# Patient Record
Sex: Female | Born: 1960 | Race: Black or African American | Hispanic: No | Marital: Single | Smoking: Never smoker
Health system: Southern US, Community
[De-identification: ages and names within clinical notes are randomized; demographics above are authoritative.]

## PROBLEM LIST (undated history)

## (undated) DIAGNOSIS — I429 Cardiomyopathy, unspecified: Secondary | ICD-10-CM

## (undated) DIAGNOSIS — E663 Overweight: Secondary | ICD-10-CM

## (undated) DIAGNOSIS — M5430 Sciatica, unspecified side: Secondary | ICD-10-CM

## (undated) DIAGNOSIS — I509 Heart failure, unspecified: Secondary | ICD-10-CM

## (undated) DIAGNOSIS — I10 Essential (primary) hypertension: Secondary | ICD-10-CM

## (undated) HISTORY — DX: Essential (primary) hypertension: I10

## (undated) HISTORY — DX: Cardiomyopathy, unspecified: I42.9

## (undated) HISTORY — DX: Overweight: E66.3

## (undated) HISTORY — DX: Heart failure, unspecified: I50.9

## (undated) HISTORY — PX: ABDOMINAL HYSTERECTOMY: SHX81

---

## 1998-04-04 ENCOUNTER — Emergency Department (HOSPITAL_COMMUNITY): Admission: EM | Admit: 1998-04-04 | Discharge: 1998-04-04 | Payer: Self-pay | Admitting: Emergency Medicine

## 1999-05-04 ENCOUNTER — Emergency Department (HOSPITAL_COMMUNITY): Admission: EM | Admit: 1999-05-04 | Discharge: 1999-05-04 | Payer: Self-pay | Admitting: Emergency Medicine

## 1999-05-04 ENCOUNTER — Encounter: Payer: Self-pay | Admitting: Emergency Medicine

## 2003-02-08 ENCOUNTER — Other Ambulatory Visit: Admission: RE | Admit: 2003-02-08 | Discharge: 2003-02-08 | Payer: Self-pay | Admitting: Obstetrics and Gynecology

## 2003-06-01 ENCOUNTER — Observation Stay (HOSPITAL_COMMUNITY): Admission: RE | Admit: 2003-06-01 | Discharge: 2003-06-02 | Payer: Self-pay | Admitting: Obstetrics and Gynecology

## 2003-06-01 ENCOUNTER — Encounter (INDEPENDENT_AMBULATORY_CARE_PROVIDER_SITE_OTHER): Payer: Self-pay | Admitting: Specialist

## 2005-02-28 ENCOUNTER — Ambulatory Visit: Payer: Self-pay | Admitting: *Deleted

## 2005-02-28 ENCOUNTER — Inpatient Hospital Stay (HOSPITAL_COMMUNITY): Admission: EM | Admit: 2005-02-28 | Discharge: 2005-03-05 | Payer: Self-pay | Admitting: Nurse Practitioner

## 2005-03-01 ENCOUNTER — Encounter: Payer: Self-pay | Admitting: Cardiology

## 2006-10-07 ENCOUNTER — Emergency Department (HOSPITAL_COMMUNITY): Admission: EM | Admit: 2006-10-07 | Discharge: 2006-10-08 | Payer: Self-pay | Admitting: Emergency Medicine

## 2006-12-08 ENCOUNTER — Ambulatory Visit: Payer: Self-pay | Admitting: Cardiology

## 2006-12-09 LAB — CONVERTED CEMR LAB
Albumin: 3.5 g/dL (ref 3.5–5.2)
Alkaline Phosphatase: 84 units/L (ref 39–117)
Basophils Relative: 1 % (ref 0.0–1.0)
CO2: 29 meq/L (ref 19–32)
Chloride: 108 meq/L (ref 96–112)
Creatinine, Ser: 1.2 mg/dL (ref 0.4–1.2)
Eosinophils Relative: 1.7 % (ref 0.0–5.0)
GFR calc non Af Amer: 52 mL/min
HCT: 39.3 % (ref 36.0–46.0)
Ketones, ur: NEGATIVE mg/dL
Leukocytes, UA: NEGATIVE
Lymphocytes Relative: 36.8 % (ref 12.0–46.0)
MCV: 88.3 fL (ref 78.0–100.0)
Monocytes Absolute: 0.5 10*3/uL (ref 0.2–0.7)
Mucus, UA: NEGATIVE
Neutrophils Relative %: 51.7 % (ref 43.0–77.0)
Platelets: 298 10*3/uL (ref 150–400)
Potassium: 3.7 meq/L (ref 3.5–5.1)
RBC / HPF: NONE SEEN
Sodium: 142 meq/L (ref 135–145)
Total Protein, Urine: 100 mg/dL — AB
WBC, UA: NONE SEEN cells/hpf
WBC: 5.4 10*3/uL (ref 4.5–10.5)

## 2006-12-12 ENCOUNTER — Ambulatory Visit: Payer: Self-pay | Admitting: Internal Medicine

## 2006-12-12 ENCOUNTER — Encounter: Payer: Self-pay | Admitting: Internal Medicine

## 2006-12-12 ENCOUNTER — Inpatient Hospital Stay (HOSPITAL_COMMUNITY): Admission: EM | Admit: 2006-12-12 | Discharge: 2006-12-15 | Payer: Self-pay | Admitting: Emergency Medicine

## 2006-12-12 ENCOUNTER — Ambulatory Visit: Payer: Self-pay

## 2006-12-12 ENCOUNTER — Ambulatory Visit: Payer: Self-pay | Admitting: Cardiology

## 2006-12-25 ENCOUNTER — Ambulatory Visit: Payer: Self-pay | Admitting: Cardiology

## 2006-12-25 LAB — CONVERTED CEMR LAB
Calcium: 9.3 mg/dL (ref 8.4–10.5)
GFR calc Af Amer: 63 mL/min
GFR calc non Af Amer: 52 mL/min
Glucose, Bld: 99 mg/dL (ref 70–99)
Potassium: 3.4 meq/L — ABNORMAL LOW (ref 3.5–5.1)
Sodium: 140 meq/L (ref 135–145)

## 2007-06-24 ENCOUNTER — Ambulatory Visit: Payer: Self-pay | Admitting: Cardiology

## 2007-06-24 LAB — CONVERTED CEMR LAB
BUN: 21 mg/dL (ref 6–23)
CO2: 29 meq/L (ref 19–32)
Creatinine, Ser: 1.3 mg/dL — ABNORMAL HIGH (ref 0.4–1.2)
GFR calc Af Amer: 57 mL/min
Potassium: 3.4 meq/L — ABNORMAL LOW (ref 3.5–5.1)

## 2007-09-15 ENCOUNTER — Ambulatory Visit: Payer: Self-pay | Admitting: Cardiology

## 2007-09-21 ENCOUNTER — Ambulatory Visit (HOSPITAL_BASED_OUTPATIENT_CLINIC_OR_DEPARTMENT_OTHER): Admission: RE | Admit: 2007-09-21 | Discharge: 2007-09-21 | Payer: Self-pay | Admitting: Cardiology

## 2007-10-09 ENCOUNTER — Ambulatory Visit: Payer: Self-pay | Admitting: Pulmonary Disease

## 2007-12-25 ENCOUNTER — Ambulatory Visit: Payer: Self-pay | Admitting: Cardiology

## 2007-12-25 LAB — CONVERTED CEMR LAB
BUN: 15 mg/dL (ref 6–23)
Calcium: 9.1 mg/dL (ref 8.4–10.5)
Chloride: 107 meq/L (ref 96–112)
Glucose, Bld: 98 mg/dL (ref 70–99)
Potassium: 3.4 meq/L — ABNORMAL LOW (ref 3.5–5.1)
Sodium: 140 meq/L (ref 135–145)

## 2008-01-11 ENCOUNTER — Ambulatory Visit: Payer: Self-pay | Admitting: Cardiology

## 2008-01-11 ENCOUNTER — Ambulatory Visit: Payer: Self-pay

## 2008-01-11 LAB — CONVERTED CEMR LAB
CO2: 27 meq/L (ref 19–32)
Calcium: 9.4 mg/dL (ref 8.4–10.5)
Chloride: 107 meq/L (ref 96–112)
GFR calc non Af Amer: 52 mL/min
Potassium: 3.7 meq/L (ref 3.5–5.1)
Sodium: 140 meq/L (ref 135–145)

## 2008-03-01 ENCOUNTER — Ambulatory Visit: Payer: Self-pay | Admitting: Cardiology

## 2008-03-27 ENCOUNTER — Ambulatory Visit: Payer: Self-pay | Admitting: Cardiology

## 2008-03-27 ENCOUNTER — Observation Stay (HOSPITAL_COMMUNITY): Admission: EM | Admit: 2008-03-27 | Discharge: 2008-03-28 | Payer: Self-pay | Admitting: Emergency Medicine

## 2008-06-10 ENCOUNTER — Ambulatory Visit: Payer: Self-pay | Admitting: Cardiology

## 2008-08-27 ENCOUNTER — Emergency Department (HOSPITAL_COMMUNITY): Admission: EM | Admit: 2008-08-27 | Discharge: 2008-08-27 | Payer: Self-pay | Admitting: Emergency Medicine

## 2008-09-23 ENCOUNTER — Ambulatory Visit: Payer: Self-pay | Admitting: Cardiology

## 2008-10-30 ENCOUNTER — Emergency Department (HOSPITAL_BASED_OUTPATIENT_CLINIC_OR_DEPARTMENT_OTHER): Admission: EM | Admit: 2008-10-30 | Discharge: 2008-10-30 | Payer: Self-pay | Admitting: Emergency Medicine

## 2009-01-11 ENCOUNTER — Ambulatory Visit: Payer: Self-pay | Admitting: Cardiology

## 2009-01-17 ENCOUNTER — Ambulatory Visit: Payer: Self-pay

## 2009-01-17 ENCOUNTER — Encounter: Payer: Self-pay | Admitting: Cardiology

## 2009-02-13 DIAGNOSIS — I428 Other cardiomyopathies: Secondary | ICD-10-CM | POA: Insufficient documentation

## 2009-02-13 DIAGNOSIS — E669 Obesity, unspecified: Secondary | ICD-10-CM

## 2009-02-13 DIAGNOSIS — I10 Essential (primary) hypertension: Secondary | ICD-10-CM | POA: Insufficient documentation

## 2009-05-13 ENCOUNTER — Ambulatory Visit: Payer: Self-pay | Admitting: Cardiovascular Disease

## 2009-05-14 ENCOUNTER — Encounter: Payer: Self-pay | Admitting: Cardiology

## 2009-05-14 ENCOUNTER — Observation Stay (HOSPITAL_COMMUNITY): Admission: EM | Admit: 2009-05-14 | Discharge: 2009-05-14 | Payer: Self-pay | Admitting: Emergency Medicine

## 2009-05-24 ENCOUNTER — Ambulatory Visit: Payer: Self-pay | Admitting: Cardiology

## 2009-05-24 DIAGNOSIS — R079 Chest pain, unspecified: Secondary | ICD-10-CM

## 2010-01-18 ENCOUNTER — Ambulatory Visit: Payer: Self-pay | Admitting: Diagnostic Radiology

## 2010-01-18 ENCOUNTER — Emergency Department (HOSPITAL_BASED_OUTPATIENT_CLINIC_OR_DEPARTMENT_OTHER): Admission: EM | Admit: 2010-01-18 | Discharge: 2010-01-18 | Payer: Self-pay | Admitting: Emergency Medicine

## 2010-03-14 ENCOUNTER — Encounter: Payer: Self-pay | Admitting: Cardiology

## 2010-03-29 ENCOUNTER — Emergency Department (HOSPITAL_BASED_OUTPATIENT_CLINIC_OR_DEPARTMENT_OTHER): Admission: EM | Admit: 2010-03-29 | Discharge: 2010-03-29 | Payer: Self-pay | Admitting: Emergency Medicine

## 2010-07-04 ENCOUNTER — Ambulatory Visit: Payer: Self-pay | Admitting: Cardiology

## 2010-07-04 DIAGNOSIS — R0602 Shortness of breath: Secondary | ICD-10-CM

## 2010-10-20 ENCOUNTER — Emergency Department (HOSPITAL_BASED_OUTPATIENT_CLINIC_OR_DEPARTMENT_OTHER)
Admission: EM | Admit: 2010-10-20 | Discharge: 2010-10-20 | Payer: Self-pay | Source: Home / Self Care | Admitting: Emergency Medicine

## 2010-11-25 ENCOUNTER — Encounter: Payer: Self-pay | Admitting: Obstetrics and Gynecology

## 2010-12-04 ENCOUNTER — Telehealth: Payer: Self-pay | Admitting: Cardiology

## 2010-12-06 NOTE — Letter (Signed)
Summary: [name]  Anthem Blue [name]   Imported By: Marylou Mccoy 07/18/2010 09:01:44  _____________________________________________________________________  External Attachment:    Type:   Image     Comment:   External Document

## 2010-12-06 NOTE — Assessment & Plan Note (Signed)
Summary: YEARLY/SL      Allergies Added:   Visit Type:  Follow-up  CC:  Cardiomyopathy.  History of Present Illness: The patient presents for followup of her difficult to control hypertension and mildly reduced ejection fraction. She had an EF of 45% in the past improved to 55% at the last echo in March of last year. She presents for followup. Her blood pressure is slightly elevated and has been recently. She's had a lot of stress no. Her daughter has been deployed to [place]. She does have some mildly progressive dyspnea with activities such as climbing a flight of stairs. She is not having any resting shortness of breath and is not having any PND or orthopnea. She is not having any chest pressure, neck or arm discomfort. She is not having any palpitations, presyncope or syncope. She has had no weight gain or edema.  Current Medications (verified): 1)  Norvasc 10 Mg Tabs (Amlodipine Besylate) .Marland Kitchen.. 1 By Mouth Daily 2)  Hydrochlorothiazide 25 Mg Tabs (Hydrochlorothiazide) .Marland Kitchen.. 1 By Mouth Daily 3)  Aldactone 25 Mg Tabs (Spironolactone) .Marland Kitchen.. 1 By Mouth Daily 4)  Labetalol Hcl 300 Mg Tabs (Labetalol Hcl) .... Take 2 Tablets By Mouth Twice A Day 5)  Ambien 10 Mg Tabs (Zolpidem Tartrate) .... As Needed 6)  Potassium Chloride Cr 10 Meq Cr-Caps (Potassium Chloride) .... 2 Tabs Bid 7)  Lisinopril 40 Mg Tabs (Lisinopril) .Marland Kitchen.. 1 By Mouth Daily  Allergies (verified): 1)  ! * Latex  Past History:  Past Medical History: Reviewed history from 02/13/2009 and no changes required. OVERWEIGHT/OBESITY (ICD-278.02) CARDIOMYOPATHY, SECONDARY (ICD-425.9) HYPERTENSION, UNSPECIFIED (ICD-401.9)    Past Surgical History: Reviewed history from 05/24/2009 and no changes required. Hysterectomy  Review of Systems       As stated in the HPI and negative for all other systems.   Vital Signs:  Patient profile:   50 year old female Height:      67 inches Weight:      220 pounds BMI:     34.58 Pulse rate:    91 / minute Resp:     18 per minute BP sitting:   144 / 92  (right arm)  Vitals Entered By: Marrion Coy, CNA (July 04, 2010 2:10 PM)  Physical Exam  General:  Well developed, well nourished, in no acute distress. Head:  normocephalic and atraumatic Eyes:  PERRLA/EOM intact; conjunctiva and lids normal. Mouth:  Teeth, gums and palate normal. Oral mucosa normal. Neck:  Neck supple, no JVD. No masses, thyromegaly or abnormal cervical nodes. Chest Wall:  no deformities or breast masses noted Lungs:  Clear bilaterally to auscultation and percussion. Abdomen:  Bowel sounds positive; abdomen soft and non-tender without masses, organomegaly, or hernias noted. No hepatosplenomegaly,obese Msk:  Back normal, normal gait. Muscle strength and tone normal. Extremities:  No clubbing or cyanosis. Neurologic:  Alert and oriented x 3. Skin:  Intact without lesions or rashes. Cervical Nodes:  no significant adenopathy Inguinal Nodes:  no significant adenopathy Psych:  Normal affect.   Detailed Cardiovascular Exam  Neck    Carotids: Carotids full and equal bilaterally without bruits.      Neck Veins: Normal, no JVD.    Heart    Inspection: no deformities or lifts noted.      Palpation: normal PMI with no thrills palpable.      Auscultation: regular rate and rhythm, S1, S2 without murmurs, rubs, gallops, or clicks.    Vascular    Abdominal Aorta: no palpable masses,  pulsations, or audible bruits.      Femoral Pulses: normal femoral pulses bilaterally.      Pedal Pulses: normal pedal pulses bilaterally.      Radial Pulses: normal radial pulses bilaterally.      Peripheral Circulation: no clubbing, cyanosis, or edema noted with normal capillary refill.     EKG  Procedure date:  07/04/2010  Findings:      Sus rhythm, rate 91, left ventricle hypertrophy with repolarization changes  Impression & Recommendations:  Problem # 1:  HYPERTENSION, UNSPECIFIED (ICD-401.9) Her blood pressure  is not quite where I would like it. However, I think with therapeutic lifestyle changes we can get there. I gave her specific instructions on a 15 pound weight loss.  Problem # 2:  DYSPNEA (ICD-786.05) She has some mild dyspnea. I will check a BNP level. If this is elevated she will need further meds titration perhaps with an additional diuretic. I will also have a low threshold for repeat echocardiography.  Problem # 3:  OVERWEIGHT/OBESITY (ICD-278.02) As above.  Other Orders: EKG w/ Interpretation (93000) TLB-BNP (B-Natriuretic Peptide) (83880-BNPR)  Patient Instructions: 1)  Your physician recommends that you schedule a follow-up appointment in: 1 yr with Dr Antoine Poche 2)  Your physician recommends that you have for lab work today  BNP 3)  Your physician recommends that you continue on your current medications as directed. Please refer to the Current Medication list given to you today.

## 2010-12-20 NOTE — Progress Notes (Signed)
Summary: NEEDS med for HTN  RX Metoprolol  Medications Added METOPROLOL TARTRATE 50 MG TABS (METOPROLOL TARTRATE) one two times a day       Phone Note Call from Patient Call back at [facility] Phone [phone]   Caller: Patient Summary of Call: Pt request call Initial call taken by: Judie Grieve,  December 04, 2010 10:54 AM  Follow-up for Phone Call        needing something else for HTN.  Her RX for labetotol cost $162 for 30 days.  She has been without medication for about one week now.  She is not working at this time.  Aware I will ask Dr Antoine Poche and call her back ASAP Follow-up by: Charolotte Capuchin, RN,  December 04, 2010 11:03 AM  Additional Follow-up for Phone Call Additional follow up Details #1::        We need to understand what her blood pressure is and whether she is taking any meds at all.  To replace the beta blocker I would try metoprolol tartrate 50 mg big.  She need to come in for the next available appt with me or Scott. Additional Follow-up by: Rollene Rotunda, MD, [facility],  December 06, 2010 10:20 AM    Additional Follow-up for Phone Call Additional follow up Details #2::    phone temp. d/ced  Avie Arenas, RN 2:10pm 12/06/2010 phone number is still d/ced  Avie Arenas, RN 4:51pm 12/07/2010 phone number is still d/ced Avie Arenas, RN 5:41pm 12/11/10  Spoke with pt who is aware of new orders.  Rx sent in as requested.  Appt scheduled to follow up with Dr Antoine Poche Follow-up by: Charolotte Capuchin, RN,  December 13, 2010 9:09 AM  New/Updated Medications: METOPROLOL TARTRATE 50 MG TABS (METOPROLOL TARTRATE) one two times a day Prescriptions: METOPROLOL TARTRATE 50 MG TABS (METOPROLOL TARTRATE) one two times a day  #60 x 11   Entered by:   Charolotte Capuchin, RN   Authorized by:   Rollene Rotunda, MD, [facility]   Signed by:   Charolotte Capuchin, RN on 12/13/2010   Method used:   Electronically to        [facility] (retail)       [address]       [place], [place]  [address]       Ph: [phone]       Fax: [phone]   RxID:   [id number]

## 2010-12-26 ENCOUNTER — Encounter: Payer: Self-pay | Admitting: Cardiology

## 2010-12-26 ENCOUNTER — Ambulatory Visit (INDEPENDENT_AMBULATORY_CARE_PROVIDER_SITE_OTHER): Payer: Self-pay | Admitting: Cardiology

## 2010-12-26 DIAGNOSIS — I5022 Chronic systolic (congestive) heart failure: Secondary | ICD-10-CM

## 2010-12-26 DIAGNOSIS — I10 Essential (primary) hypertension: Secondary | ICD-10-CM

## 2011-01-01 NOTE — Assessment & Plan Note (Signed)
Summary: f/u BP started Metoprolol 50mg  BID  Medications Added SPIRONOLACTONE 50 MG TABS (SPIRONOLACTONE) once daily      Allergies Added:   Visit Type:  Follow-up Primary Provider:  None  CC:  Cardiomyopathy.  History of Present Illness: The patient presents for one year follow up.  Since I last saw her she has had no new cardiovascular compaints.  She does not exercise but she does stay very active taking care of young grandchildren.  She denies any chest pain.  She has had no new shortness of breath, PND or orthopnea. She has had no palpitations, presyncope or syncope. She has had no weight gain or edema.She says she does take her blood pressure at home and it's in the 130s to 140s systolic with diastolics typically in the 90s and occasionally above 100.  Current Medications (verified): 1)  Norvasc 10 Mg Tabs (Amlodipine Besylate) .Marland Kitchen.. 1 By Mouth Daily 2)  Hydrochlorothiazide 25 Mg Tabs (Hydrochlorothiazide) .Marland Kitchen.. 1 By Mouth Daily 3)  Aldactone 25 Mg Tabs (Spironolactone) .Marland Kitchen.. 1 By Mouth Daily 4)  Ambien 10 Mg Tabs (Zolpidem Tartrate) .... As Needed 5)  Potassium Chloride Cr 10 Meq Cr-Caps (Potassium Chloride) .... 2 Tabs Bid 6)  Lisinopril 40 Mg Tabs (Lisinopril) .Marland Kitchen.. 1 By Mouth Daily 7)  Metoprolol Tartrate 50 Mg Tabs (Metoprolol Tartrate) .... One Two Times A Day  Allergies (verified): 1)  ! * Latex  Past History:  Past Medical History: OVERWEIGHT/OBESITY (ICD-278.02) CARDIOMYOPATHY, SECONDARY (ICD-425.9) (EF 40%. Improved to 50%.) HYPERTENSION, UNSPECIFIED (ICD-401.9)    Past Surgical History: Reviewed history from 05/24/2009 and no changes required. Hysterectomy  Review of Systems       As stated in the HPI and negative for all other systems.   Vital Signs:  Patient profile:   50 year old female Height:      67 inches Weight:      214 pounds BMI:     33.64 Pulse rate:   75 / minute Resp:     16 per minute BP sitting:   170 / 108  (right arm)  Vitals  Entered By: Marrion Coy, CNA (December 26, 2010 4:25 PM)  Physical Exam  General:  Well developed, well nourished, in no acute distress. Head:  normocephalic and atraumatic Eyes:  PERRLA/EOM intact; conjunctiva and lids normal. Mouth:  Teeth, gums and palate normal. Oral mucosa normal. Neck:  Neck supple, no JVD. No masses, thyromegaly or abnormal cervical nodes. Chest Wall:  no deformities or breast masses noted Lungs:  Clear bilaterally to auscultation and percussion. Abdomen:  Bowel sounds positive; abdomen soft and non-tender without masses, organomegaly, or hernias noted. No hepatosplenomegaly. Msk:  Back normal, normal gait. Muscle strength and tone normal. Extremities:  No clubbing or cyanosis. Neurologic:  Alert and oriented x 3. Skin:  Intact without lesions or rashes. Cervical Nodes:  no significant adenopathy Inguinal Nodes:  no significant adenopathy Psych:  Normal affect.   Detailed Cardiovascular Exam  Neck    Carotids: Carotids full and equal bilaterally without bruits.      Neck Veins: Normal, no JVD.    Heart    Inspection: no deformities or lifts noted.      Palpation: normal PMI with no thrills palpable.      Auscultation: regular rate and rhythm, S1, S2 without murmurs, rubs, gallops, or clicks.    Vascular    Abdominal Aorta: no palpable masses, pulsations, or audible bruits.      Femoral Pulses: normal femoral pulses bilaterally.  Pedal Pulses: normal pedal pulses bilaterally.      Radial Pulses: normal radial pulses bilaterally.      Peripheral Circulation: no clubbing, cyanosis, or edema noted with normal capillary refill.     EKG  Procedure date:  12/26/2010  Findings:      Sinus rhythm axis within normal limits, intervals within normal limits, no acute ST-T wave changes.  Impression & Recommendations:  Problem # 1:  HYPERTENSION, UNSPECIFIED (ICD-401.9) Her blood pressure is elevated. Today I'm going to increase Aldactone to 50 mg  daily. She will come back for blood pressure check and basic metabolic profile in one week She understands the need for weight loss with diet and exercise as a strategy for blood pressure control.  Problem # 2:  CARDIOMYOPATHY, SECONDARY (ICD-425.9) Her cardiomyopathy is mild and will be maintained in the context of treating her high blood pressure.  Problem # 3:  CHEST PAIN (ICD-786.50) She has no chest discomfort and she will continue with meds as listed.  Problem # 4:  CHEST PAIN (ICD-786.50) She has no further chest pain.  No further work up is indicated. Orders: EKG w/ Interpretation (93000)  Patient Instructions: 1)  Your physician recommends that you schedule a follow-up appointment in: 1 week with a blood pressure check 2)  Your physician recommends that you return for lab work in:  1 week for BMP 401.1 v58.69 3)  Your physician has recommended you make the following change in your medication: Start Spironolactone 50 mg once a day Prescriptions: SPIRONOLACTONE 50 MG TABS (SPIRONOLACTONE) once daily  #30 x 11   Entered by:   Charolotte Capuchin, RN   Authorized by:   Rollene Rotunda, MD, [facility]   Signed by:   Charolotte Capuchin, RN on 12/26/2010   Method used:   Electronically to        [facility] (retail)       [address]       [place], [place]  [address]       Ph: [phone]       Fax: [phone]   RxID:   [id number]  I have reviewed and approved all prescriptions at the time of this visit. Rollene Rotunda, MD, [facility]  December 26, 2010 5:47 PM

## 2011-01-02 ENCOUNTER — Other Ambulatory Visit: Payer: Self-pay

## 2011-01-07 ENCOUNTER — Encounter: Payer: Self-pay | Admitting: Cardiology

## 2011-01-07 ENCOUNTER — Other Ambulatory Visit (INDEPENDENT_AMBULATORY_CARE_PROVIDER_SITE_OTHER): Payer: Self-pay

## 2011-01-07 ENCOUNTER — Encounter (INDEPENDENT_AMBULATORY_CARE_PROVIDER_SITE_OTHER): Payer: Self-pay | Admitting: *Deleted

## 2011-01-07 ENCOUNTER — Ambulatory Visit (INDEPENDENT_AMBULATORY_CARE_PROVIDER_SITE_OTHER): Payer: Self-pay | Admitting: Cardiology

## 2011-01-07 ENCOUNTER — Other Ambulatory Visit: Payer: Self-pay | Admitting: Cardiology

## 2011-01-07 DIAGNOSIS — I10 Essential (primary) hypertension: Secondary | ICD-10-CM

## 2011-01-07 DIAGNOSIS — I429 Cardiomyopathy, unspecified: Secondary | ICD-10-CM

## 2011-01-07 LAB — BASIC METABOLIC PANEL
BUN: 21 mg/dL (ref 6–23)
CO2: 23 mEq/L (ref 19–32)
Chloride: 105 mEq/L (ref 96–112)
GFR: 57.96 mL/min — ABNORMAL LOW (ref >60.00)

## 2011-01-08 ENCOUNTER — Telehealth: Payer: Self-pay | Admitting: Cardiology

## 2011-01-15 NOTE — Assessment & Plan Note (Signed)
Summary: f/u bp per [facility]   PF,RN  Medications Added METOPROLOL TARTRATE 50 MG TABS (METOPROLOL TARTRATE) three times a day      Allergies Added:   Visit Type:  Follow-up Primary Provider:  None  CC:  HTN.  History of Present Illness: The patient returns for blood pressure check.  However, her BP remains elevated.  She has tolerated increased spironolactone.  However, her BP is still elevated.  She was added to the schedule today.  She actually feels well.  She has not felt like her BP was up.  That is, she has had no HA or increased fatigue.  She is having no new shortness of breath, PND or orthopnea. He has had no new weight gain or edema. She is not exercising as much as outlined.  Current Medications (verified): 1)  Norvasc 10 Mg Tabs (Amlodipine Besylate) .Marland Kitchen.. 1 By Mouth Daily 2)  Hydrochlorothiazide 25 Mg Tabs (Hydrochlorothiazide) .Marland Kitchen.. 1 By Mouth Daily 3)  Ambien 10 Mg Tabs (Zolpidem Tartrate) .... As Needed 4)  Potassium Chloride Cr 10 Meq Cr-Caps (Potassium Chloride) .... 2 Tabs Bid 5)  Lisinopril 40 Mg Tabs (Lisinopril) .Marland Kitchen.. 1 By Mouth Daily 6)  Metoprolol Tartrate 50 Mg Tabs (Metoprolol Tartrate) .... One Two Times A Day 7)  Spironolactone 50 Mg Tabs (Spironolactone) .... Once Daily  Allergies (verified): 1)  ! * Latex  Past History:  Past Medical History: Reviewed history from 12/26/2010 and no changes required. OVERWEIGHT/OBESITY (ICD-278.02) CARDIOMYOPATHY, SECONDARY (ICD-425.9) (EF 40%. Improved to 50%.) HYPERTENSION, UNSPECIFIED (ICD-401.9)    Past Surgical History: Reviewed history from 05/24/2009 and no changes required. Hysterectomy  Review of Systems       As stated in the HPI and negative for all other systems.   Vital Signs:  Patient profile:   50 year old female Height:      67 inches Weight:      216 pounds BMI:     33.95 Pulse rate:   80 / minute Resp:     18 per minute BP sitting:   142 / 108  (left arm)  Vitals Entered By: Marrion Coy,  CNA (January 07, 2011 11:00 AM)  Physical Exam  General:  Well developed, well nourished, in no acute distress. Head:  normocephalic and atraumatic Eyes:  PERRLA/EOM intact; conjunctiva and lids normal. Neck:  Neck supple, no JVD. No masses, thyromegaly or abnormal cervical nodes. Chest Wall:  no deformities or breast masses noted Lungs:  Clear bilaterally to auscultation and percussion. Abdomen:  Bowel sounds positive; abdomen soft and non-tender without masses, organomegaly, or hernias noted. No hepatosplenomegaly. Msk:  Back normal, normal gait. Muscle strength and tone normal. Extremities:  No clubbing or cyanosis. Neurologic:  Alert and oriented x 3. Skin:  Intact without lesions or rashes. Cervical Nodes:  no significant adenopathy Inguinal Nodes:  no significant adenopathy Psych:  Normal affect.   Detailed Cardiovascular Exam  Neck    Carotids: Carotids full and equal bilaterally without bruits.      Neck Veins: Normal, no JVD.    Heart    Inspection: no deformities or lifts noted.      Palpation: normal PMI with no thrills palpable.      Auscultation: regular rate and rhythm, S1, S2 without murmurs, rubs, gallops, or clicks.    Vascular    Abdominal Aorta: no palpable masses, pulsations, or audible bruits.      Femoral Pulses: normal femoral pulses bilaterally.      Pedal Pulses:  normal pedal pulses bilaterally.      Radial Pulses: normal radial pulses bilaterally.      Peripheral Circulation: no clubbing, cyanosis, or edema noted with normal capillary refill.     Impression & Recommendations:  Problem # 1:  HYPERTENSION, UNSPECIFIED (ICD-401.9) Today I will increase her metoprolol to 50 mg t.i.d. She will remain on the meds listed.  Problem # 2:  OVERWEIGHT/OBESITY (ICD-278.02) We discussed at length weight loss. I have given her specific instructions to lose 3-4 pounds over the next 3 months to help with her blood pressure control.  Problem # 3:  CHEST PAIN  (ICD-786.50) she is having no further chest pain. No further cardiovascular testing is suggested.  Patient Instructions: 1)  Your physician recommends that you schedule a follow-up appointment in 6 weeks with Dr Antoine Poche 2)  Your physician has recommended you make the following change in your medication: Increase Metoprolol to 50 mg three times a day 3)  Your physician encouraged you to lose weight for better health. (10 lbs) Prescriptions: METOPROLOL TARTRATE 50 MG TABS (METOPROLOL TARTRATE) three times a day  #90 x 6   Entered by:   Charolotte Capuchin, RN   Authorized by:   Rollene Rotunda, MD, [facility]   Signed by:   Charolotte Capuchin, RN on 01/07/2011   Method used:   Electronically to        [facility] (retail)       [address]       [place], [place]  [address]       Ph: [phone]       Fax: [phone]   RxID:   [id number]  As stated in the HPI and negative for all other systems. Rollene Rotunda, MD, [facility]  January 07, 2011 1:43 PM

## 2011-01-15 NOTE — Progress Notes (Signed)
Summary: pt rtn call   ? who called   Phone Note Call from Patient Call back at Home Phone [phone]   Caller: Patient Reason for Call: Talk to Nurse, Talk to Doctor Summary of Call: pt rtn call Initial call taken by: Omer Jack,  January 08, 2011 9:37 AM  Follow-up for Phone Call        I didnt call pt today.  She states someone called her today from here at 9:36 am but she wasn't able to get to the phone.  I will check with the schedulers to see what I can find out. Follow-up by: Charolotte Capuchin, RN,  January 08, 2011 10:04 AM  Additional Follow-up for Phone Call Additional follow up Details #1::        I have not been able to determine who called the pt Additional Follow-up by: Charolotte Capuchin, RN,  January 08, 2011 3:47 PM

## 2011-01-28 LAB — CBC
Hemoglobin: 12.8 g/dL (ref 12.0–15.0)
MCHC: 34.2 g/dL (ref 30.0–36.0)
RBC: 4.17 MIL/uL (ref 3.87–5.11)
RDW: 13.5 % (ref 11.5–15.5)
WBC: 7.8 10*3/uL (ref 4.0–10.5)

## 2011-01-28 LAB — URINALYSIS, ROUTINE W REFLEX MICROSCOPIC
Hgb urine dipstick: NEGATIVE
Nitrite: NEGATIVE
Protein, ur: NEGATIVE mg/dL
Urobilinogen, UA: 0.2 mg/dL (ref 0.0–1.0)

## 2011-01-28 LAB — BASIC METABOLIC PANEL
Calcium: 9.1 mg/dL (ref 8.4–10.5)
Chloride: 108 mEq/L (ref 96–112)
GFR calc non Af Amer: 35 mL/min — ABNORMAL LOW (ref >60)
Sodium: 145 mEq/L (ref 135–145)

## 2011-01-28 LAB — DIFFERENTIAL
Basophils Relative: 3 % — ABNORMAL HIGH (ref 0–1)
Lymphocytes Relative: 31 % (ref 12–46)
Lymphs Abs: 2.4 10*3/uL (ref 0.7–4.0)
Neutro Abs: 4.3 10*3/uL (ref 1.7–7.7)
Neutrophils Relative %: 54 % (ref 43–77)

## 2011-01-28 LAB — D-DIMER, QUANTITATIVE: D-Dimer, Quant: 0.89 ug/mL-FEU — ABNORMAL HIGH (ref 0.00–0.48)

## 2011-02-10 LAB — LIPID PANEL
Cholesterol: 223 mg/dL — ABNORMAL HIGH (ref 0–200)
HDL: 50 mg/dL (ref >39)
LDL Cholesterol: 162 mg/dL — ABNORMAL HIGH (ref 0–99)
Total CHOL/HDL Ratio: 4.5 RATIO
Triglycerides: 54 mg/dL (ref <150)
VLDL: 11 mg/dL (ref 0–40)

## 2011-02-10 LAB — TROPONIN I
Troponin I: 0.02 ng/mL (ref 0.00–0.06)
Troponin I: 0.02 ng/mL (ref 0.00–0.06)

## 2011-02-10 LAB — COMPREHENSIVE METABOLIC PANEL
ALT: 15 U/L (ref 0–35)
Albumin: 3.5 g/dL (ref 3.5–5.2)
BUN: 20 mg/dL (ref 6–23)
Potassium: 4 mEq/L (ref 3.5–5.1)
Total Protein: 7.1 g/dL (ref 6.0–8.3)

## 2011-02-10 LAB — PROTIME-INR
INR: 1 (ref 0.00–1.49)
Prothrombin Time: 13 seconds (ref 11.6–15.2)

## 2011-02-10 LAB — CBC
HCT: 39.6 % (ref 36.0–46.0)
Hemoglobin: 13.7 g/dL (ref 12.0–15.0)
Platelets: 301 10*3/uL (ref 150–400)
RDW: 13.9 % (ref 11.5–15.5)

## 2011-02-10 LAB — CK TOTAL AND CKMB (NOT AT ARMC)
Relative Index: 0.5 (ref 0.0–2.5)
Relative Index: 0.6 (ref 0.0–2.5)

## 2011-02-10 LAB — POCT CARDIAC MARKERS
CKMB, poc: <1 ng/mL — ABNORMAL LOW (ref 1.0–8.0)
Myoglobin, poc: 100 ng/mL (ref 12–200)
Troponin i, poc: <0.05 ng/mL (ref 0.00–0.09)

## 2011-02-10 LAB — BRAIN NATRIURETIC PEPTIDE: Pro B Natriuretic peptide (BNP): <30 pg/mL (ref 0.0–100.0)

## 2011-02-13 ENCOUNTER — Encounter: Payer: Self-pay | Admitting: Cardiology

## 2011-02-17 ENCOUNTER — Encounter: Payer: Self-pay | Admitting: Cardiology

## 2011-02-18 ENCOUNTER — Ambulatory Visit: Payer: Self-pay | Admitting: Cardiology

## 2011-03-19 NOTE — Assessment & Plan Note (Signed)
[facility]                            CARDIOLOGY OFFICE NOTE   NAME:[name], [name]                          MRN:          [id number]  DATE:09/15/2007                            DOB:          Dec 15, 1960    PRIMARY CARE PHYSICIAN:  None.   REASON FOR PRESENTATION:  Evaluate patient with difficult to control  hypertension and a mildly reduced ejection fraction.   HISTORY OF PRESENT ILLNESS:  Patient is 50 years old.  She has done  better since I last saw her.  She has been more compliant with taking  her medications.  She has been checking her blood pressure at home  occasionally.  It is about 130/88.  She is not having any new symptoms.  She states that she is not getting headaches since her blood pressure is  controlled.  She is not having any new shortness of breath.  She denies  any PND or orthopnea.  She has had no presyncope or syncope.  Of note,  she does snore.  She is fatigued throughout the day.  She has been told  by one child, at least, that she may stop breathing.  She has never been  tested for sleep apnea.   PAST MEDICAL HISTORY:  1. Nonischemic cardiomyopathy (EF 45%).  2. Hypertension.  3. Normal coronary arteries on catheterization in 2006.  4. Hysterectomy.   ALLERGIES:  None.   CURRENT MEDICATIONS:  1. Amlodipine 7.5 mg daily.  2. Hydrochlorothiazide 25 mg daily.  3. Potassium 20 mEq daily.  4. Labetalol 400 mg b.i.d.  5. Avapro 300 mg daily.   REVIEW OF SYSTEMS:  As stated in the HPI, otherwise negative for other  systems.   Patient is in no distress.  Blood pressure 137/92, heart rate 88 and regular.  Weight 222 pounds.  Body Mass Index  HEENT:  Eyelids unremarkable.  Pupils are equal, round and reactive to  light.  Fundi not visualized.  Oral mucosa unremarkable.  NECK:  No jugular venous distention at 45 degrees.  Carotid upstroke  brisk and symmetric.  No bruits, no thyromegaly.  LYMPHATICS:  No cervical, axillary,  or inguinal adenopathy.  LUNGS:  Clear to auscultation bilaterally.  BACK:  No costovertebral angle tenderness.  CHEST:  Unremarkable.  HEART:  PMI not displaced or sustained.  S1 and S2 within normal limits.  No S3, no S4, no clicks, rubs, or murmurs.  ABDOMEN:  Mildly obese.  Positive bowel sounds.  Normal in frequency and  pitch.  No bruits, rebound, guarding.  There are no midline pulsatile  masses.  No hepatomegaly, no splenomegaly.  SKIN:  No rashes, no nodules.  EXTREMITIES:  Pulses 2+ throughout.  No cyanosis, no clubbing, no edema.  NEUROLOGIC:  Alert and oriented to person, place, and time.  Cranial  nerves II-XII grossly intact.  Motor grossly intact throughout.   ASSESSMENT/PLAN:  1. Hypertension:  Blood pressure is better controlled on the medical      regimen as listed.  She is not having any problems with the  increase of her Amlodipine.  At this point, I am wondering whether      sleep apnea could be a component of her difficult to control      hypertension and her mildly reduced ejection fraction.  She has      classic symptoms of sleep apnea.  I will send her for this study.  2. Cardiomyopathy:  Again, this will be managed in the context of      treating her hypertension.  She has class I symptoms at this point.  3. Obesity:  She understands the need to lose weight with diet and      exercise.  She does have Body Mass Index that puts he in the obese      range.  4. Followup:  I will see her back in three months.     Rollene Rotunda, MD, [facility]  Electronically Signed    JH/MedQ  DD: 09/15/2007  DT: 09/16/2007  Job #: [id number]   cc:   Merlene Laughter. Renae Gloss, M.D.

## 2011-03-19 NOTE — H&P (Signed)
[name], [name] NO.:  [id number]   MEDICAL RECORD NO.:  [id number]          PATIENT TYPE:  OBV   LOCATION:  3712                         FACILITY:  [facility]   PHYSICIAN:  Christell Faith, MD   DATE OF BIRTH:  [birth year]   DATE OF ADMISSION:  05/13/2009  DATE OF DISCHARGE:                              HISTORY & PHYSICAL   CARDIOLOGIST:  Rollene Rotunda, MD, [facility]   CHIEF COMPLAINT:  Chest pressure.   HISTORY OF PRESENT ILLNESS:  This is a 50 year old African American  female with a history of significant hypertension who presents with two  and half days of intermittent lower sternal chest pressure.  It was 6/10  at worst today.  It is brought on by stress and relieved with rest.  She  reports increased stress lately with her in-laws living in her house,  and financial problems.  In addition, she is very close to the age that  both of her parents and one of her brothers all died, and that is one of  the major reasons that she came into the emergency room tonight.   PAST MEDICAL HISTORY:  1. Very difficult to control hypertension.  2. Hypertensive heart disease, left ventricular hypertrophy on echo,      moderately reduced ejection fraction in the past, however, EF was      up to 55% on most recent echo from March 2010.  3. Normal coronaries on catheterization from 2006.  4. There was no evidence of renal artery stenosis on the same      catheterization.  5. Status post total abdominal hysterectomy.   SOCIAL HISTORY:  She lives in [place].  She has never smoked.  She  drinks 20 ounces of beer a day.  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  Does not use any drugs.   FAMILY HISTORY:  Mother died at age 42 of a ruptured cerebral aneurysm.  Father died at age 43 of myocardial infarction and brother died in his  24s of myocardial infarction.  Her mother and brother both had severe  hypertension and she suspects that her father did  too.   REVIEW OF SYSTEMS:  Positive for chest pain and stress.   ALLERGIES:  LATEX.   MEDICINES:  1. Labetalol 300 mg p.o. b.i.d.  2. Lisinopril 40 mg p.o. daily.  3. Norvasc 10 mg p.o. daily.  4. Spironolactone 25 mg p.o. daily.  5. Hydrochlorothiazide 25 mg p.o. daily.  6. Potassium chloride 20 mEq p.o. b.i.d.   PHYSICAL EXAMINATION:  VITAL SIGNS:  Temperature 98.4, pulse 89,  respiratory rate 18, blood pressure 159/100, and saturation 99% on 2  liters.  GENERAL:  This is a very pleasant African American female in no acute  distress.  HEENT:  Pupils are round and reactive.  There is no papilledema.  Sclerae are clear.  NECK:  Supple.  Neck veins are flat.  No carotid bruits.  No cervical  adenopathy.  CARDIAC:  Normal rate, regular rhythm.  No murmurs.  No S4 or S3  gallop.  LUNGS:  Clear to auscultation bilaterally without wheezing or rales.  ABDOMEN:  Soft, nontender, and nondistended.  EXTREMITIES:  No edema, 2+ dorsalis pedis and radial pulses bilaterally.  NEUROLOGIC:  A 5/5 strength in all 4 extremities.  Facial expressions  are intact and symmetric.  SKIN:  No rash or lesions.   DIAGNOSTIC TESTS:  EKG reveals sinus rhythm at 90 beats per minute.  There is evidence of left ventricular hypertrophy with strain.   LABORATORY DATA:  White blood cells 6.7, hemoglobin 13.7, and platelets  301.  CK-MB and troponin undetectable on point-of-care testing.   IMPRESSION:  A 50 year old [place] American female with significant  hypertension who has been having intermittent chest pressure for the  past 2-3 days.   PLAN:  1. Admit to Dr. Antoine Poche to telemetry bed for observation status.  We      will continue to cycle cardiac enzymes and EKGs.  We will add      aspirin to her medical regimen, but we will hold off on      anticoagulation for now.  2. This is an extremely pleasant lady.  However, she admits to being      off all of her blood pressure medicines for at least a  month before      resuming them three and half weeks ago.  I think a lot of this has      to do with financial pressures.  Therefore, I question how      compliant she is with all of her blood pressure medicines.  To test      this, we will place her on all of her home oral agents while in the      hospital, and observe how her blood pressure runs.  If her blood      pressure remains elevated, the next step will probably be to      increase the labetalol and intensify the search for potential      secondary cause such as hyperaldosteronism or obstructive sleep      apnea.  In addition, her beer intake could be contributing to      hypertension.  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  3. We will check fasting lipid panel for further risk stratification.      In addition, we will check a comprehensive metabolic panel and TSH.  4. Check PA and lateral chest x-ray.  5. DVT prophylaxis with subcu heparin.      Christell Faith, MD  Electronically Signed     NDL/MEDQ  D:  05/14/2009  T:  05/14/2009  Job:  [id number]

## 2011-03-19 NOTE — Assessment & Plan Note (Signed)
[facility]                            CARDIOLOGY OFFICE NOTE   NAME:[name], [name]                          MRN:          [id number]  DATE:03/01/2008                            DOB:          07-Jan-1961    PRIMARY CARE PHYSICIAN:  None.   REASON FOR PRESENTATION:  Evaluate patient with hypertension and mildly  reduced ejection fraction.   HISTORY OF PRESENT ILLNESS:  The patient is a pleasant 50 year old  African American female with difficult to control hypertension.  Since I  last saw her, she has had no complaints.  In particular, she denies any  chest discomfort, neck or arm discomfort.  She has had no palpitation,  presyncope or syncope.  She has had no PND or orthopnea.  She says she  is watching what she eats but she stopped at [employer] for breakfast  this morning.  She has not lost any weight.  She is doing some walking.   PAST MEDICAL HISTORY:  1. 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  2. Hypertension.  3. Normal coronary arteries on catheterization 2006.  4. Hysterectomy.   ALLERGIES:  None.   MEDICATIONS:  1. Hydrochlorothiazide 25 mg daily.  2. Labetalol 400 mg b.i.d.  3. Avapro 300 mg daily.  4. Amlodipine 10 mg daily.  5. Potassium 40 mg daily.  6. Spironolactone 25 mg daily.   REVIEW OF SYSTEMS:  As stated in HPI, otherwise negative for other  systems.   PHYSICAL EXAMINATION:  GENERAL APPEARANCE:  The patient is in no  distress.  VITAL SIGNS:  Blood pressure 144/102, heart rate 84 and regular.  HEENT:  Eyes lids unremarkable.  Pupils equal, round, reactive to light.  Fundi not visualized.  Oral mucosa normal.  NECK:  No jugular venous distension, waveform within normal limits,  carotid upstroke brisk and symmetrical.  No bruits, no thyromegaly.  LYMPHATICS:  No adenopathy.  LUNGS:  Clear to auscultation bilaterally.  BACK:  No costovertebral angle tenderness.  CHEST:  Unremarkable.  HEART:  PMI not displaced or sustained,  S1-S2 within normal limits, no  S3-S4, clicks, rubs, murmurs.  ABDOMEN:  Obese, positive bowel sounds, normal in frequency and pitch,  no bruits, rebound, guarding or midline pulsatile mass.  No  hepatomegaly, no splenomegaly.  SKIN:  No rashes, no nodules.  EXTREMITIES:  2+ pulses, no edema.   ASSESSMENT/PLAN:  1. Hypertension.  Blood pressure still not quite at target.  I do not      think she is really adhering to lifestyle changes as I would      suggest.  She certainly has not lost weight.  I am not going to      change her medicines.  She is going to keep a blood pressure diary.      After keeping this for a couple months, she will send this to me.      If her blood pressures are running particularly high, she will let      me know before then.  In the meantime, she will continue to double  her efforts, stopping salt and losing weight.  2. Cardiomyopathy.  She has class I symptoms.  We will continue to      treat this primarily by concentrating on pressure control.  3. Follow-up.  Will see her back in 6 months or sooner if needed.     Rollene Rotunda, MD, [facility]  Electronically Signed    JH/MedQ  DD: 03/01/2008  DT: 03/01/2008  Job #: [id number]

## 2011-03-19 NOTE — H&P (Signed)
NAMEKRITIKA, STUKES                   ACCOUNT NO.:  [id number]   MEDICAL RECORD NO.:  [id number]          PATIENT TYPE:  EMS   LOCATION:  MAJO                         FACILITY:  [facility]   PHYSICIAN:  Madolyn Frieze. Jens Som, MD, FACCDATE OF BIRTH:  1961/04/27   DATE OF ADMISSION:  03/27/2008  DATE OF DISCHARGE:                              HISTORY & PHYSICAL   HISTORY:  The patient is a 50 year old patient of Dr. Jenene Slicker with  past medical history of nonischemic cardiomyopathy and hypertension who  we have asked to evaluate for chest pain.  Note, she did have a cardiac  catheterization on Mar 04, 2005.  At that time, she was found to have  normal coronary arteries.  Ejection fraction was 30-35% with global  hypokinesis.  Her last echocardiogram was performed on December 12, 2006.  At that time, she was found to have an ejection fraction of 45% with  hypokinesis of the inferoposterior wall.  There was small mild-to-  moderate LVH.  There is trivial aortic insufficiency.  There is also  trivial mitral regurgitation.  She has been followed by Dr. Antoine Poche for  these issues as well as significant hypertension.  Note, she states her  blood pressure had been recently well-controlled as of late.  Today, she  had an aggravating pain in the substernal area.  He then developed into  a tightness.  It lasted for approximately 20 minutes.  The pain did not  radiate.  There was no associated shortness of breath, nausea, vomiting,  or diaphoresis.  It was not pleuritic or positional nor is it related to  food.  It was not clearly exertional.  She came to emergency room for  further evaluation and she is presently pain free.   ALLERGIES:  She has no known drug allergies.  She does have an allergy  to LATEX.   MEDICATIONS:  Labetalol 400 mg p.o. b.i.d., Avapro 300 mg p.o. daily,  Norvasc 10 mg p.o. daily, hydrochlorothiazide 25 mg p.o. daily,  spironolactone 25 mg p.o. daily, and potassium 20 mEq p.o.  daily.   SOCIAL HISTORY:  She does not smoke.  She does occasionally consume  alcohol.  There is no history of drug use.   FAMILY HISTORY:  Strong with positive coronary artery disease.   PAST MEDICAL HISTORY:  Significant for hypertension, but there is no  diabetes mellitus or hyperlipidemia.  She has a history of a mild  nonischemic cardiomyopathy as described in the HPI.  She has had  previous hysterectomy.  There is no other past medical history noted.   REVIEW OF SYSTEMS:  She denies any headaches or fevers or chills.  There  is no productive cough or hemoptysis.  There is no dysphagia,  odynophagia, melena, or hematochezia.  There is no dysuria or hematuria.  There is no rash or seizure activity.  There is no orthopnea, PND, or  pedal edema.  The remaining systems are negative.   PHYSICAL EXAMINATION:  VITAL SIGNS:  Blood pressure 139/99 and pulse is  87.  The temperature is 98.8.  She is  98% on room air.  GENERAL:  She is well-developed and somewhat obese.  She is in no acute  distress at present.  SKIN:  Warm and dry.  She does not appear to be depressed.  There is no  peripheral clubbing.  The back is normal.  HEENT:  Normal with normal eyelids.  NECK:  Supple with a normal upstroke bilaterally.  No bruits noted.  There is no jugular venous distention and no thyromegaly.  CHEST:  The chest is clear to auscultation.  Normal expansion.  CARDIOVASCULAR:  Regular rate and rhythm.  Normal S1-S2.  No murmurs,  rubs, or gallops noted.  She does not have any tenderness to palpation  in her chest area.  ABDOMEN:  Nontender, nondistended, positive bowel sounds, no  hepatosplenomegaly.  No mass appreciated.  There is no abdominal bruit.  She has 2+ femoral pulses bilaterally.  No bruits.  EXTREMITIES:  Show no edema that I can palpate.  No cords.  She has 2+  dorsalis pedis pulses bilaterally.  NEUROLOGIC:  Grossly intact.   LABORATORY DATA:  Her chest x-ray shows minimal  bibasilar atelectasis.  Her initial cardiac markers are negative.  A pregnancy test is negative.  Her lipase is normal at 41.  Her white blood cell count of 6.2 with a  hemoglobin 12.5, and hematocrit 36.5.  Platelet count is 300.  A D-dimer  is normal at 0.29.  Her sodium is 134, potassium 3.5.  Her BUN and  creatinine are 16 and 1.20.  Her liver functions are normal.  Her  electrocardiogram shows a sinus rhythm at a rate of 83.  The axis is  normal.  There is poor R-wave progression and minor nonspecific ST  changes are noted.   DIAGNOSES:  1. Atypical chest pain - Ms. Culbreath's symptoms are atypical.      Electrocardiogram shows no acute ST changes.  She did have a      cardiac catheterization performed in 2006 with normal coronary      arteries.  We will admit and rule out myocardial infarction with      serial enzymes.  Note her D-dimer is normal.  If her enzymes are      negative, then we will plan an outpatient Myoview for risk      stratification and followup with Dr. Antoine Poche.  2. History of mild nonischemic cardiomyopathy - she will continue on      her beta blocker, ARB and spironolactone.  3. Hypertension - her blood pressure appears to be adequately      controlled at present.  We will continue with her labetalol,      Avapro, Norvasc, hydrochlorothiazide, and spironolactone.  We will      adjust as indicated while she is in the hospital.      Madolyn Frieze. Jens Som, MD, [facility]  Electronically Signed     BSC/MEDQ  D:  03/27/2008  T:  03/28/2008  Job:  [id number]

## 2011-03-19 NOTE — Discharge Summary (Signed)
NAMECRISTIANA, [name]                   ACCOUNT NO.:  [id number]   MEDICAL RECORD NO.:  [id number]          PATIENT TYPE:  OBV   LOCATION:  3712                         FACILITY:  [facility]   PHYSICIAN:  Noralyn Pick. Eden Emms, MD, FACCDATE OF BIRTH:  11-25-1960   DATE OF ADMISSION:  05/13/2009  DATE OF DISCHARGE:  05/14/2009                               DISCHARGE SUMMARY   PRIMARY CARDIOLOGIST:  Rollene Rotunda, MD, [facility]   DISCHARGE DIAGNOSIS:  Atypical chest pain.   SECONDARY DIAGNOSES:  1. Hypertensive heart disease.  2. Normal coronary arteries.      a.     By cardiac catheterization in 2006.  3. Hypertension.   PROCEDURES:  None.   REASON FOR ADMISSION:  [name] is a 50 year old female, with history of  nonobstructive coronary artery disease, who presented to the emergency  room with complaint of chest pain.  She was admitted for rule out of  myocardial infarction and further evaluation.   HOSPITAL COURSE:  The patient ruled out for myocardial infarction with  all cardiac markers within normal limits.   The patient's symptoms were felt to be quite atypical and, following a  routine x-ray which was unrevealing, the patient was cleared for  discharge.  No medication adjustments were made.  The patient was  advised to remain compliant with her medications.   DISPOSITION AT DISCHARGE:  Stable.   LABORATORY DATA:  Sodium 140, potassium 4.0, BUN 20, creatinine 1.4 (GFR  50).  Normal liver function tests.  Cardiac markers normal.  BNP less  than 30, total cholesterol 223, triglyceride 54, HDL 50, and LDL was 62.  TSH 2.4.   Chest x-ray:  Tortuous aorta, otherwise unremarkable.   DISCHARGE MEDICATIONS:  1. Hydrochlorothiazide 25 mg daily.  2. Norvasc 10 mg daily.  3. K-Dur 40 mEq daily.  4. Aldactone 25 mg daily.  5. Labetalol 300 mg b.i.d.  6. Lisinopril 40 mg daily.   INSTRUCTIONS:  1. The patient is to follow up with Dr. Rollene Rotunda in the      following 2-3 weeks.   Arrangements will be made through our office.  2. The patient is instructed to establish with a primary care      physician.   Discharge encounter, including physician time, greater than 30 minutes.      Gene Serpe, PA-C      Peter C. Eden Emms, MD, [facility]  Electronically Signed    GS/MEDQ  D:  05/14/2009  T:  05/15/2009  Job:  [id number]

## 2011-03-19 NOTE — Assessment & Plan Note (Signed)
[facility]                            CARDIOLOGY OFFICE NOTE   [name], [name]                          MRN:          [id number]  DATE:12/25/2007                            DOB:          03-28-1961    PRIMARY CARE PHYSICIAN:  None.   REASON FOR PRESENTATION:  Difficult to control hypertension and mildly  reduced ejection fraction.   HISTORY OF PRESENT ILLNESS:  The patient is a 50 year old African  American female.  Since I last saw her she still occasionally gets  dyspneic with activities such as when she is rushing around.  However,  she does not have any resting shortness of breath.  She is not  describing any PND or orthopnea..  She has not been having any  palpitations, presyncope or syncope.  At the last visit I did not change  her medications.  I sent her for a sleep study which did not demonstrate  episodes diagnostic of apnea.   PAST MEDICAL HISTORY:  Nonischemic cardiomyopathy (EF of 45%),  hypertension, normal coronary arteries on catheterization 2006,  hysterectomy.   ALLERGIES:  None.   MEDICATIONS:  1. Hydrochlorothiazide 25 mg daily.  2. Labetalol 400 mg b.i.d.  3. Avapro 300 mg daily.  4. Amlodipine 10 mg daily.  5. Potassium 40 mEq daily.   REVIEW OF SYSTEMS:  As stated in HPI, otherwise negative for other  systems.   PHYSICAL EXAMINATION:  GENERAL:  The patient is in no distress.  VITAL SIGNS:  Blood pressure 147/91, heart rate 89 and regular, weight  225 pounds, body mass index 34.  HEENT:  Eyes are unremarkable.  Pupils equal, round and reactive to  light.  Fundi not visualized, oral mucosa unremarkable.  NECK:  No jugular venous distention at 45 degrees.  Carotid upstroke  brisk and symmetrical.  No bruits, no thyromegaly.  LYMPHATICS:  No cervical, axillary, inguinal adenopathy.  LUNGS:  Clear to auscultation bilaterally.  BACK:  No costovertebral angle tenderness.  CHEST:  Unremarkable.  HEART:  PMI not  displaced or sustained.  S1-S2 within normal limits.  No  S3, no S4, no clicks, no rubs, no murmurs.  ABDOMEN:  Flat, positive bowel sounds, normal frequency and pitch.  No  bruits, no rebound, no guarding or midline pulsatile mass.  No  hepatomegaly.  No splenomegaly.  SKIN:  No rashes or nodules.  EXTREMITIES:  2+ pulses throughout.  No edema, no cyanosis or clubbing.  NEURO:  Oriented to person, place, time.  Cranial nerves II-XII grossly  intact, motor grossly intact.   EKG sinus rhythm, rate 76, axis within normal limits, intervals within  normal limits, nonspecific inferior T-wave changes.   ASSESSMENT AND PLAN:  1. Hypertension.  The patient's blood pressure is still not at target.      Now I am going to add spironolactone 25 mg daily.  We discussed      hyperkalemia.  She is going to reduce her potassium 20 to mEq      daily.  She has actually been hypokalemic in the past.  We will  get      a BMET today and a BMET again in 10 days.  We will be very careful      given the combination of ARB, potassium and spironolactone.  She      will exercise and try to lose weight.  All of this I suspect will      bring her blood pressure down.  2. Dyspnea.  I think she has some diastolic dysfunction as well as      very mild systolic dysfunction.  This will be managed in the      context of treating her hypertension.  3. Weight.  We had a long discussion about the need to lose weight      with diet and exercise and I gave her a prescription to include the      [employer].  4. Followup.  I will see her back in about 6 weeks or sooner if      needed.     Rollene Rotunda, MD, [facility]  Electronically Signed    JH/MedQ  DD: 12/25/2007  DT: 12/26/2007  Job #: [id number]

## 2011-03-19 NOTE — Discharge Summary (Signed)
[name], [name]                   ACCOUNT NO.:  [id number]   MEDICAL RECORD NO.:  [id number]          PATIENT TYPE:  INP   LOCATION:  3738                         FACILITY:  [facility]   PHYSICIAN:  Bevelyn Buckles. Bensimhon, MDDATE OF BIRTH:  Jun 22, 1961   DATE OF ADMISSION:  03/27/2008  DATE OF DISCHARGE:                               DISCHARGE SUMMARY   PRIMARY CARDIOLOGIST:  Rollene Rotunda, MD   DISCHARGE DIAGNOSIS:  Chest pain.   SECONDARY DIAGNOSES:  1. Nonischemic cardiomyopathy.  2. Hypertension.  3. 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  4. Obesity.   ALLERGIES:  LATEX.   PROCEDURES:  None.   HISTORY OF PRESENT ILLNESS:  A 50 year old female with prior history of  hypertension and mixed systolic and diastolic heart failure who was in  her usual state of health until Mar 27, 2008, when she developed an  aggravating substernal chest discomfort that progressed to tightness  without associated symptoms lasting 20 minutes and resolving  spontaneously.  She was concerned about this, and presented to [facility] ED for evaluation.  In the ED, ECG showed no acute changes, and d-  dimer and cardiac markers were normal.  She was admitted for  observation.   HOSPITAL COURSE:  The patient ruled out for MI.  She has had no  additional chest discomfort.  We have titrated her labetalol to 600 mg  b.i.d., and otherwise she has done well.  She will be discharged to home  today in good condition.   DISCHARGE LABS:  Hemoglobin 12.5, hematocrit 36.5, WBC 6.2, and  platelets 300,000.  INR 1.0, sodium 134, potassium 3.5, chloride 103,  CO2 24, BUN 16, creatinine 1.20, and glucose 113.  Total bilirubin 0.7.  Alkaline phosphatase 66, AST 21, ALT 17, total protein 6.9, albumin 3.6,  calcium 8.6, lipase 41, CK 115, and MB 0.7.  Troponin I of 0.02.  TSH  1.335.  Beta hCG was negative.   DISPOSITION:  The patient is being discharged home today in good  condition.   FOLLOWUP APPOINTMENT:  We will arrange a followup with Dr. Antoine Poche in  approximately 2 weeks.   DISCHARGE MEDICATIONS:  1. Labetalol 300 mg 2 tablets b.i.d.  2. Avapro 300 mg daily.  3. Hydrochlorothiazide 25 mg daily.  4. Norvasc 10 mg daily.  5. K-Dur 20 mEq daily.  6. Spironolactone 25 mg daily.  7. Ambien 10 mg nightly p.r.n.   OUTSTANDING LAB STUDIES:  None.   DURATION OF DISCHARGE/ENCOUNTER:  Thirty minutes including physician  time.      Nicolasa Ducking, ANP      Bevelyn Buckles. Bensimhon, MD  Electronically Signed    CB/MEDQ  D:  03/28/2008  T:  03/28/2008  Job:  [id number]
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
## 2011-03-19 NOTE — Letter (Signed)
September 15, 2007    Dr. Andi Devon  [facility]  [address]  [place], [place] [address]   RE:  [name], [name]  MRN:  [id number]  /  DOB:  Mar 23, 1961   Dear [name],   I would like to please have you consider seeing [name] as a new  patient.  She is a pleasant 50 year old female who I have managed for  hypertensive urgency and a mildly reduced ejection fraction of  approximately 30% in the past, though it has improved to 45% on followup  echo.  I think this is related to hypertension.  Blood pressure has been  difficult to control but is well-controlled currently.  I also think she  may have sleep apnea and I recently sent her for this study, though I do  not have these results.  She does not have a primary care doctor.  She  does see a GYN but would like a primary care physician.  I have given  her your name and telephone number.   Thank you for your help with this very pleasant lady.  If you have any  questions please do not hesitate to give me a call.    Sincerely,      Rollene Rotunda, MD, [facility]  Electronically Signed    JH/MedQ  DD: 09/15/2007  DT: 09/16/2007  Job #: [id number]

## 2011-03-19 NOTE — Procedures (Signed)
[name], [name] NO.:  [id number]   MEDICAL RECORD NO.:  [id number]          PATIENT TYPE:  OUT   LOCATION:  SLEEP CENTER                 FACILITY:  [facility]   PHYSICIAN:  Barbaraann Share, MD,FCCPDATE OF BIRTH:  Jun 04, 1963   DATE OF STUDY:  09/21/2007                            NOCTURNAL POLYSOMNOGRAM   REFERRING PHYSICIAN:  Rollene Rotunda, MD, [facility]   REFERRING PHYSICIAN:  Dr. Angelina Sheriff.   INDICATION FOR STUDY:  Hypersomnia with sleep apnea.   EPWORTH SLEEPINESS SCORE:  7   SLEEP ARCHITECTURE:  The patient was found to have a total sleep time of  366 minutes with very little slow wave sleep and decreased REM.  Sleep  onset latency was normal and REM onset was very rapid at 28 minutes.  Sleep efficiency was excellent at 96%.   RESPIRATORY DATA:  The patient was found to have 2 obstructive apneas  and 3 central apneas for an apnea-hypopnea index of less than 1 per  hour.  The events were not positional and there was snoring noted  throughout.  The patient did not meet split night criteria secondary to  the very small numbers of events.   OXYGEN DATA:  There was transient O2 desaturation as low at 89% with  only 24 minutes during the entire night less than 90%.   CARDIAC DATA:  No clinically significant arrhythmias were noted.   MOVEMENT/PARASOMNIA:  None.   IMPRESSIONS/RECOMMENDATIONS:  1. Small numbers of obstructive events which do not meet the apnea-      hypopnea index criteria for the obstructive sleep apnea syndrome.  2. Transient oxygen desaturation as low as 89% for a very short period      of time and is not clinically significant.     Barbaraann Share, MD,FCCP  Diplomate, American Board of Sleep  Medicine  Electronically Signed    [facility]/MEDQ  D:  10/10/2007 11:19:23  T:  10/11/2007 08:25:14  Job:  [id number]

## 2011-03-19 NOTE — Assessment & Plan Note (Signed)
[facility]                            CARDIOLOGY OFFICE NOTE   NAME:[name], [name]                          MRN:          [id number]  DATE:06/10/2008                            DOB:          09/23/1961    PRIMARY CARE PHYSICIAN:  None.   REASON FOR PRESENTATION:  Evaluate the patient with hypertension and  cardiomyopathy.   HISTORY OF PRESENT ILLNESS:  The patient presents for followup of the  above.  She is 50 years old.  She was recently in the emergency room for  evaluation of chest pain.  She was kept overnight.  There was no  evidence of ischemia or infarct.  She was not hospitalized.  She  continues to have some nagging discomfort under her left breast.  She  has described this kind of discomfort in the past as well.  Last time,  she has stress perfusion study, which was negative for any evidence of  ischemia, and she was describing this kind of discomfort.  She does not  have any shortness of breath.  She does not have any PND or orthopnea.  She has been taking her medications.  Her blood pressure at home has  been in the 130s, and the diastolic is slightly above 90 typically.   PAST MEDICAL HISTORY:  Nonischemic cardiomyopathy (current EF of about  45%), hypertension, normal coronary arteries on catheterization in 2006,  hysterectomy.   ALLERGIES:  None.   MEDICATIONS:  1. Labetalol 600 mg b.i.d.  2. Spironolactone 25 mg daily.  3. Potassium 40 mEq daily.  4. Amlodipine 10 mg daily.  5. Avapro 300 mg daily.  6. Hydrochlorothiazide 25 mg daily.   REVIEW OF SYSTEMS:  As stated in the HPI and otherwise negative for  other systems.   PHYSICAL EXAMINATION:  GENERAL:  The patient is in no distress.  VITAL SIGNS:  Blood pressure 136/96, heart rate 86 and regular.  HEENT:  Eyelids are unremarkable; pupils equal, round, and reactive to  light; fundi not visualized; oral mucosa unremarkable.  NECK:  No jugular venous distention at 45  degrees; carotid upstroke  brisk and symmetric; no bruits, no thyromegaly.  LYMPHATICS:  No adenopathy.  LUNGS:  Clear to auscultation bilaterally.  BACK:  No costovertebral angle tenderness.  CHEST:  Unremarkable.  HEART:  PMI not displaced or sustained; S1 and S2 within normal limits;  no S3, no S4, no clicks, no rubs, no murmurs.  ABDOMEN:  Obese; positive bowel sounds, normal in frequency and pitch;  no bruits, no rebound, no guarding, no midline pulsatile mass; no  hepatomegaly, no splenomegaly.  SKIN:  No rashes, no nodules.  EXTREMITIES:  2+ pulses, no edema.   EKG sinus rhythm, rate 86, axis within normals, intervals within normal  limits, no acute ST-T wave changes.   ASSESSMENT AND PLAN:  1. Hypertension.  Blood pressure seems to be better, and according to      her home readings, within a reasonable level.  I will make her to      lose a few pounds and it will  even improve the blood pressure      control more.  She will remain on the current medications.  2. Chest discomfort.  This is atypical.  She has had normal coronary      arteries.  I would think that a cardiovascular etiology is      extremely unlikely.  No further cardiovascular testing is      suggested.  3. Cardiomyopathy.  She has class I symptoms.  Her ejection fraction      was at 45% when tested last year.  No further evaluation is      warranted at this time.  She will remain on medications as listed.  4. Followup.  I will see her back in 6 months.  She did have a BMET      the other day in ER, and her potassium is fine.  She knows that she      needs to have her potassium looked at, and I would like this to be      four times a year.  I am going to leave this up to her that every 3      months she needs to come back for the BMET.  She is aware of this      and understands this instruction and says that she will comply.      She understands the risk of hyperkalemia with this combination of       medications.     Rollene Rotunda, MD, [facility]  Electronically Signed    JH/MedQ  DD: 06/10/2008  DT: 06/11/2008  Job #: [id number]

## 2011-03-19 NOTE — Assessment & Plan Note (Signed)
[facility]                            CARDIOLOGY OFFICE NOTE   NAME:[name], [name] TOW                          MRN:          [id number]  DATE:09/23/2008                            DOB:          1961-01-10    PRIMARY CARE PHYSICIAN:  None.   REASON FOR PRESENTATION:  Evaluate the patient with hypertension and  cardiomyopathy.   HISTORY OF PRESENT ILLNESS:  The patient returns for followup.  She has  done well since I last saw her.  She did have one trip to the ER in  October for chest pain and was found to have bronchitis.  She had her  electrolytes drawn at that visit and they were normal.  She had negative  cardiac enzymes.   Otherwise, she says she is walking sometimes about 4 miles a day and  with this, she is not having any chest discomfort, neck, or arm  discomfort.  She is not having any shortness of breath, PND, or  orthopnea.  She is not having any palpitations, presyncope, or syncope.   PAST MEDICAL HISTORY:  Nonischemic cardiomyopathy (EF 45%),  hypertension, normal coronary arteries on catheterization in 2006, and  hysterectomy.   ALLERGIES:  None.   MEDICATIONS:  1. Hydrochlorothiazide 25 mg daily.  2. Amlodipine 10 mg daily.  3. Potassium 40 mEq daily.  4. Spironolactone 25 mg daily.  5. Labetalol 600 mg b.i.d.  6. Lisinopril 40 mg daily.   REVIEW OF SYSTEMS:  As stated in the HPI and otherwise negative for  other systems.   PHYSICAL EXAMINATION:  GENERAL:  The patient is in no distress.  VITAL SIGNS:  Blood pressure 128/85, heart rate 86 and regular, weight  220 pounds, and body mass index 35.  HEENT:  Eyelids unremarkable.  Pupils equal, round, and reactive to  light.  Fundi not visualized, oral mucosa unremarkable.  NECK:  No jugular venous distention at 45 degrees.  Carotid upstroke  brisk and symmetrical.  No bruits, no thyromegaly.  LYMPHATICS:  No cervical, axillary, or inguinal adenopathy.  LUNGS:  Clear to  auscultation bilaterally.  BACK:  No costovertebral angle tenderness.  CHEST:  Unremarkable.  HEART:  PMI not displaced or sustained.  S1 and S2 within normal limits.  No S3, no S4, no clicks, no rubs, and no murmurs.  ABDOMEN:  Obese,  positive bowel sounds.  Normal in frequency and pitch, no bruits, no  rebound, no guarding, no midline pulsatile mass, no hepatomegaly, and no  splenomegaly.  SKIN:  No rashes, no nodules.  EXTREMITIES:  2+ pulse, edema.  NEURO:  Oriented to person, place, and time.  Cranial nerves II through  XII are grossly intact.  Motor grossly intact.   EKG sinus rhythm, rate 86, axis within normal limits, intervals within  normal limits, no acute ST-T wave changes.   ASSESSMENT AND PLAN:  1. Hypertension.  Blood pressure is actually well controlled now.  She      will continue on the meds as listed.  2. Cardiomyopathy.  Her symptoms have class I.  No change to  be made      to her medical regimen.  She will continue with salt and fluid      restriction.  She understands the need to get her BMET checked      about 3 times a year on spironolactone.  3. Followup.  I will see her back in 6 months or sooner if needed.     Rollene Rotunda, MD, [facility]  Electronically Signed    JH/MedQ  DD: 09/23/2008  DT: 09/24/2008  Job #: [id number]

## 2011-03-19 NOTE — Assessment & Plan Note (Signed)
[facility]                            CARDIOLOGY OFFICE NOTE   NAME:[name], [name]                          MRN:          [id number]  DATE:06/24/2007                            DOB:          [birth year]    PRIMARY CARE PHYSICIAN:  None   REASON FOR PRESENTATION:  Evaluate patient with difficult to control  hypertension and cardiomyopathy.   HISTORY OF PRESENT ILLNESS:  The patient is 50 years old. She returns  for followup of the above. She has cancelled several appointments  because of deaths in her family. She has been under a bit of stress  because of this and also the birth of twins, grandchildren. She has not  been taking her blood pressure. She has been taking her medications  however. She has not had any new symptoms. She denies any headaches or  blurred vision. She has had no pre-syncope or syncope. She has had no  chest pain or shortness of breath. She has had no palpitations, PND or  orthopnea.   PAST MEDICAL HISTORY:  1. Nonischemic cardiomyopathy (ejection fraction most recently 45%).  2. Hypertension.  3. Normal coronary arteries on catheterization in 2006.  4. Hysterectomy.   ALLERGIES:  None.   MEDICATIONS:  1. Amlodipine 7.5 mg daily.  2. Hydrochlorothiazide 25 mg daily.  3. Potassium 20 mEq daily.  4. Labetalol 400 mg b.i.d.  5. Avapro 300 mg daily.   REVIEW OF SYSTEMS:  As stated in the HPI and otherwise negative for  other systems.   PHYSICAL EXAMINATION:  The patient is in no distress. Blood pressure  151/102, heart rate 89 and irregular, weight 219 pounds. Body Mass Index  is 34.  HEENT: Eyelids unremarkable. Pupils equal, round, and reactive to light.  Fundi not visualized. Oral mucosa is unremarkable.  NECK: No jugular venous distention at 45 degrees. Carotid upstroke brisk  and symmetrical. No bruits, no thyromegaly.  LYMPHATICS: No cervical, axillary or inguinal adenopathy.  LUNGS: Clear to auscultation  bilaterally.  BACK: No costovertebral angle tenderness.  CHEST: Unremarkable.  HEART: PMI not displaced or sustained. S1, S2 within normal limits. No  S3. No S4. No clicks, rub or murmurs.  ABDOMEN: Obese, positive bowel sounds, normal in frequency and pitch. No  bruits. No rebounds. No guarding. No midline pulsatile mass. No  hepatomegaly, splenomegaly.  SKIN: No rashes, no nodules.  EXTREMITIES: 2+ pulses throughout. Mild bilateral lower extremity edema.  NEURO: Oriented to person, place and time. Cranial nerves II-XII grossly  intact. Motor grossly intact.   EKG: Sinus rhythm. Rate is 84. Axis within normal limits. Intervals  within normal limits. Left ventricular hypertrophy by voltage criteria.  No acute ST-T wave changes.   ASSESSMENT/PLAN:  1. Hypertension. The patient's blood pressure is still not at target.      I am going to increase amlodipine to 10 mg daily. I suspect she      will need an additional medication and spironolactone would be my      choice. However, she needs to demonstrate that she comes for  routine blood work and followup if I start this medication.  2. Cardiomyopathy. To be managed as above. She is on beta-blocker and      ARB. She is having Class I symptoms. I will check a BMET today as      she did not show up for followup as was she was instructed to do      and has slightly low potassium previously.  3. Followup. Will see her back in two months or sooner if needed.     Rollene Rotunda, MD, [facility]  Electronically Signed    JH/MedQ  DD: 06/24/2007  DT: 06/24/2007  Job #: [id number]

## 2011-03-22 NOTE — Op Note (Signed)
NAME:  [name], [name]                             ACCOUNT NO.:  [id number]   MEDICAL RECORD NO.:  [id number]                   PATIENT TYPE:  OBV   LOCATION:  9399                                 FACILITY:  [facility]   PHYSICIAN:  Sherry A. Rosalio Macadamia, M.D.           DATE OF BIRTH:  [birth year]   DATE OF PROCEDURE:  06/01/2003  DATE OF DISCHARGE:                                 OPERATIVE REPORT   PREOPERATIVE DIAGNOSES:  1. Menorrhagia.  2. Fibroid uterus.   POSTOPERATIVE DIAGNOSES:  1. Menorrhagia.  2. Fibroid uterus.   PROCEDURE:  Laparoscopically-assisted vaginal hysterectomy.   SURGEONS:  Sherry A. Rosalio Macadamia, M.D., and Maxie Better, M.D.   ANESTHESIA:  General.   INDICATIONS:  This is a 50 year old G3, P3-0-0-3, woman who has been having  monthly menstrual periods, which have been excessively heavy.  The patient  has had known fibroids.  Ultrasound revealed enlarged uterus with multiple  fibroids, approximately 11-12 weeks' size.  There were no submucosal  fibroids seen on sonohysterogram.  Because of this she was treated with  three months of Lupron to decrease the size of her uterus and then brought  to the operating room for LAVH.   FINDINGS:  A 10-11 week size fibroid uterus.  Normal fallopian tubes and  ovaries, status post tubal ligation.   SURGICAL SPECIMENS:  Uterus and cervix.   DESCRIPTION OF PROCEDURE:  The patient is brought into the operating room,  given adequate general anesthesia.  She was placed a dorsal lithotomy  position.  Her abdomen and vagina were washed with Hibiclens, a Foley  catheter inserted into the bladder.  Pelvic examination was performed.  A  speculum was placed within the vagina.  The cervix was grasped with a single-  tooth tenaculum.  The cervix was sounded.  The cervix was dilated slightly  and a single-tooth Hulka tenaculum was placed in the endocervical cavity.  The first tenaculum and speculum were removed.  The surgeon's gown  and  gloves were changed.  The patient was draped in a sterile fashion.  The  subumbilical area was infiltrated with 0.5% Marcaine.  Incision was made.  The tissue was brought down to the fascia.  The fascia was grasped with  Kocher clamps.  The fascia was incised.  The fascial edges were identified  and a pursestring stitch was taken of 0 Vicryl circumferentially.  The  peritoneum was entered bluntly.  The Hasson trocar was introduced into the  peritoneal cavity, and it was cinched down with a 0 Vicryl suture.  The  carbon dioxide was allowed to insufflate and laparoscope was placed.  It was  felt that the fibroids were such that it was likely that an LAVH could be  performed.  Using 0.5% Marcaine, the areas were infiltrated and incisions  made and under direct visualization, trocars were placed.  The left round  ligament was cauterized with tripolar  cautery x3 and cut.  The left utero-  ovarian ligaments were cauterized and cut in a similar fashion.  The same  procedure was performed on the right.  There were a few adhesions of omentum  to fallopian tubes, status post tubal ligation.  These adhesions were  cauterized and cut to give mobility to the bowel.  The bowel was able to be  kept out of the pelvis during the cautery and dissection.  Once the cautery  was brought down around the fibroids to approximately the level of the  uterine arteries, the anterior leaf of the broad ligament and the anterior  peritoneum across the cervix was incised and a bladder flap was developed  with blunt dissection.  At this point it was felt that adequate dissection  had occurred and the vaginal part of the case could begin.   The patient was placed in a steep lithotomy position.  A weighted speculum  was placed within the vagina.  The cervix was grasped with two Bartholome Bill after removing the Hulka tenaculum.  The cervix was infiltrated  with 1% Xylocaine with epinephrine circumferentially.  The  cervix was  circumcised.  The vaginal mucosa was dissected off of the cervix with blunt  dissection.  The posterior cul-de-sac was identified and entered sharply.  A  long weighted speculum was placed within this space.  The uterosacral  ligaments were clamped, cut, and suture ligated with 0 Vicryl LigaSures.  The anterior tissues were dissected off of the cervix and the anterior cul-  de-sac was entered bluntly.  Cardinal ligaments were clamped with LigaSure  and cauterized x2 or 3 prior to cutting.  Small fibroids were first removed  with cautery and dissection to decrease the size of the uterus.  Once all of  the cardinal ligaments and vasculature on the patient's left had been cut  and cauterized, the uterus was dissected in the midline and allowed to be  removed in this fashion.  It was then obvious that all vasculature had  already been cauterized and cut, and the entire uterus was able to be  removed.  The vaginal edges had some small amounts of bleeding.  These were  closed with 0 Vicryl figure-of-eight stitches.  The posterior cuff was  closed with 0 Vicryl in a running locked whipstitch.  Adequate hemostasis  was present.  The anterior peritoneum was identified and grasped with an  Allis clamp, and the vaginal cuff was closed using 0 Vicryl in figure-of-  eight stitches, including the peritoneum into the anterior stitches as well.  The uterosacral ligaments were tied in the midline and the remainder of the  vaginal cuff was closed with 0 Vicryl figure-of-eight stitches in an  anterior-posterior fashion.  Adequate hemostasis was present in the vagina.  All instruments were removed.  Surgeon's gloves were changed.  The patient  was taken out of the steep dorsal lithotomy position.  The instruments were  replaced intra-abdominally.  The gas was reinsufflated.  The pelvis was  inspected.  There was a small amount of bleeding around the right ovary. This was initially cauterized with  the tripolar.  Because of some coagulated  material on the tripolar, it was sticking; therefore, Kleppingers were used  and adequate hemostasis was present along the right ovary and ovarian hilum.  Small bleeders underneath the bladder peritoneum were cauterized.  The  entire pelvis was inspected and irrigated.  No significant bleeding was  present.  The upper abdomen was inspected  and felt to be normal.  All fluid  was suctioned out.  Reinspection of the pelvis after deflation of some of  the gas revealed adequate hemostasis.  Pictures had been obtained.  All  carbon dioxide was allowed to escape.  The Hasson was removed.  The lower  trocars removed after all carbon dioxide had escaped.  The umbilical  incision was closed with the Vicryl stitch that was in place by cinching it  down and tying it.  It was carefully cinched down to make sure no bowel was  cut.  The umbilical incision was closed with 4-0 Monocryl in a subcuticular  running stitch.  All incisions were then closed with Dermabond.  No Band-  Aids were necessary.  Adequate hemostasis was present.  The patient was  taken out of the dorsal lithotomy position.  She was awakened.  She was  extubated.  She was moved from the operating table to a stretcher in stable  condition.   COMPLICATIONS:  None.   ESTIMATED BLOOD LOSS:  100 mL.                                               Sherry A. Rosalio Macadamia, M.D.    SAD/MEDQ  D:  06/01/2003  T:  06/01/2003  Job:  [id number]

## 2011-03-22 NOTE — Assessment & Plan Note (Signed)
[facility]                            CARDIOLOGY OFFICE NOTE   [name], [name]                          MRN:          [id number]  DATE:12/25/2006                            DOB:          11/04/1961    CARDIOLOGIST:  Dr. Rollene Rotunda.   PRIMARY CARE PHYSICIAN:  None.   HISTORY OF PRESENT ILLNESS:  [name] is a 50 year old female patient  who has severe hypertension, who was seen in followup in the office on  February 8.  She has a history of cardiomyopathy with an EF as low as  35% in the past.  Recent echocardiogram done December 12, 2006 revealed  an EF of 45%.  She also has a history of normal coronary arteries by  catheterization in 2006.  It has been felt that her cardiomyopathy is  related to hypertension.  When seen in the office on February 8, her  pressure was still dramatically elevated and she was admitted for  further evaluation and treatment of hypertensive urgency.  Her blood  pressure was controlled in the hospital and she was discharged on  February 11.  She is seen back in the office today for post-  hospitalization followup.  She is doing well without any chest pain,  shortness of breath, syncope, blurred vision, or headaches.  She is  taking her medications well.  She is limiting her salt intake.   CURRENT MEDICATIONS:  1. Atacand 16 mg 2 tablets daily.  2. Hydrochlorothiazide 25 mg daily.  3. Labetalol 3 mg 2 tablets twice daily.  4. Potassium 10 mEq daily.  5. Amlodipine 5 mg daily.   PHYSICAL EXAM:  She is a well-developed, well-nourished female in no  acute distress.  Blood pressure 134/92, pulse 68.  Repeat blood pressure by me on the  left is 130/106, 140/106 on the right.  HEENT:  Unremarkable.  NECK:  Without JVD.  CARDIAC:  S1, S2.  Regular rate and rhythm.  LUNGS:  Clear to auscultation bilaterally.  ABDOMEN:  Soft and nontender.  EXTREMITIES:  Without edema.   DATABASE:  During the patient's  hospitalization, her creatinine was  1.15, TSH was normal at 1.51.  A 24 hour urine to rule out  pheochromocytoma was inconclusive.   IMPRESSION:  1. Nonischemic cardiomyopathy with an ejection fraction of 45%.  2. Hypertension, better controlled.  3. Normal coronary arteries by catheterization in 2006.  4. Family Hx. of Systemic Lupus Erythematosus   PLAN:  The patient presents back to the office today for post  hospitalization followup.  She is doing well and her blood pressure  looks much, much better.  She feels better as well.  She does need a  little bit better control of her diastolic blood pressure.  We will  increase her amlodipine to 7.5 mg daily.  Check a BMET today to followup  on her renal function and potassium.  She will also be set up for a  repeat 24 hour urine to rule out pheochromocytoma.  I will have her  follow up with Dr. Antoine Poche in the  next 1 month.  Her sister, who has  lupus, also has significant hypertension and would like to be seen in  our clinic.  She will set up an appointment as a new patient with Dr.  Antoine Poche or myself on a day that Dr. Antoine Poche is here in the next  several days.      Tereso Newcomer, PA-C  Electronically Signed      Rollene Rotunda, MD, [facility]  Electronically Signed   SW/MedQ  DD: 12/25/2006  DT: 12/25/2006  Job #: [id number]

## 2011-03-22 NOTE — Consult Note (Signed)
[name], [name] NO.:  [id number]   MEDICAL RECORD NO.:  [id number]          PATIENT TYPE:  EMS   LOCATION:  MAJO                         FACILITY:  [facility]   PHYSICIAN:  Vida Roller, M.D.   DATE OF BIRTH:  Dec 18, 1960   DATE OF CONSULTATION:  02/28/2005  DATE OF DISCHARGE:                                   CONSULTATION   Primary is unknown.  She does not have a cardiologist.  She gets most of her  health care from [facility] A. [name], M.D., in obstetrics.   HISTORY OF PRESENT ILLNESS:  [name] is a 50 year old African-American  female who has a history of hypertension, who approximately a month ago  decided to start taking her hypertension medications.  Approximately six  days ago she began to have shortness of breath associated with some  discomfort in her chest whenever she ambulated.  It got so severe over the  course of the last six days that she was unable to walk up a flight of  stairs without getting short of breath.  She had some discomfort and  shortness of breath at rest.  She presented to the emergency department,  where she was found to be markedly hypertensive with a blood pressure of  234/130.  She was treated aggressively with nitrates and labetalol, brought  her blood pressure down to the 180/90 range with subsequent resolution of  her symptoms.  Her discomfort was prolonged and was in the right side of her  chest.  This was associated with some significant shortness of breath that  did not radiate.  She did not describe a ripping or tearing sensation, and  it resolved with sublingual nitroglycerin.  She is now resting comfortably  without any complaints.  She denies any other symptoms.   PAST MEDICAL HISTORY:  Significant for hypertension.  She has trouble  affording her medications and stopped taking them about a month ago.  She  also has had a history of a hysterectomy.  She denies hyperlipidemia,  diabetes, ongoing tobacco abuse.   She does have a family history of coronary disease and a family history of  stroke as well as a relatively dense family history of hypertension.   She is allergic to LATEX.  She gets a rash.   She had a hysterectomy in July 2004 for fibroids and dysmenorrhea under the  care of Dr. Rosalio [name].  She has had no other surgeries.   REVIEW OF SYSTEMS:  She denies any headaches, sinus discharge, visual  changes, sore throat, problems with her tonsils.  She denies any neck pain,  no problems with her thyroid.  No difficulty swallowing.  No pain when she  swallows.  She denies any other shortness of breath beyond that described in  the history of present illness, no wheezing, no cough productive of sputum,  no hemoptysis.  She denies any palpitations, no syncope.  She does not have  any dizziness.  She does not have any hemoptysis or melena.  There is no  change in her bowel habits.  She denies  any jaundice.  She is morbidly  obese.  She denies any polyuria or polydipsia or any diabetes.  She denies  any weakness, numbness or tingling.  She denies any neurologic problems.  She denies any skin rashes other than that described with her LATEX allergy.  She does not have any psychiatric problems, no depression or anxiety.   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  She has three children, ages 71, 59 and 78.  All are healthy,  except the 48 year old has hypertension.  She does not smoke.  She drinks  beer on a daily basis.  She denies any significant drug abuse.   PHYSICAL EXAMINATION:  GENERAL:  She is an obese black female in no apparent  distress.  She is alert and oriented x4 and a reasonably good historian.  VITAL SIGNS:  Temperature is 99.3, her pulse is 90 and regular, respirations  are 14.  She is afebrile.  Her blood pressure when she presented was 234/145  .  It is now 176/105.  She is saturating 99% on room air.  HEENT:  Examination of the head,  ears, eyes, nose and throat is  unremarkable.  NECK:  Supple.  There is no jugular venous distention or carotid bruits.  Her thyroid is normal size in the midline.  CHEST:  Clear to auscultation bilaterally.  CARDIOVASCULAR:  Regular.  She has an S4, but there is no murmur noted.  First and second heart sounds are normal.  Her point of maximum impulse is  not palpable.  There are no lifts or thrills.  BREASTS, GENITOURINARY, RECTAL:  Deferred.  ABDOMEN:  Soft, nontender, obese, with no hepatosplenomegaly.  NEUROLOGIC:  Normal.  MUSCULOSKELETAL:  Normal.  EXTREMITIES:  Her lower extremities are without significant clubbing or  cyanosis.  She has trace edema at her ankles.  Her pulses are all 2+  throughout.  There are no bruits.   Her electrocardiogram shows sinus rhythm at a rate of 98 with normal  intervals and normal axes.  She has left ventricular hypertrophy by voltage.  She has poor R-wave progression across her anterior precordium.  She has no  ischemic ST-T changes, but she does have a repolarization abnormality  consistent with LVH.   LABORATORY DATA:  White blood cell count 6.8, H&H of 12.5 and 36.9, platelet  count of 263.  Sodium 139, potassium 3.3, chloride 110, bicarb 24, BUN 15,  creatinine 1.2, and her blood sugar is 89.  Her B-type natriuretic peptide  is 233.  Her troponins on the point of care x2 are negative.  Her chest x-  ray shows cardiomegaly with no pulmonary edema.   So this is a woman who has malignant hypertension with known hypertension,  who stopped her medication and likely has rebound hypertension which has  been responsive to her reasonable medications.  She also has chest pain.  She is now pain-free, but the initial exertional and now at rest with  prolonged episode are concerning.  She also has morbid obesity.  We do not  know her lipid status, and she has a LATEX allergy.  She has left ventricular hypertrophy on electrocardiogram with some evidence  of  repolarization abnormality, which may mask an ischemic change.   My recommendations are as follows:  I agree with management of her so far  with her medications.  I would recommend the addition of a beta blocker as  she has responded  so nicely to labetalol.  I think she needs an  echocardiogram to assess her LV function.  I think we will need to cycle her  enzymes.  I would recommend getting fasting lipids and a Chem-17 to make  sure she does not have diabetes and depending on the results of her echo,  she will need either a catheterization or an inpatient Cardiolite to assess  her for occlusive coronary disease.  Also, I think it is reasonable to  replace her potassium aggressively.      JH/MEDQ  D:  02/28/2005  T:  03/01/2005  Job:  [id number]

## 2011-03-22 NOTE — Discharge Summary (Signed)
[name], [name]                   ACCOUNT NO.:  [id number]   MEDICAL RECORD NO.:  [id number]          PATIENT TYPE:  INP   LOCATION:  3704                         FACILITY:  [facility]   PHYSICIAN:  Maple Mirza, PA   DATE OF BIRTH:  [birth year]   DATE OF ADMISSION:  12/12/2006  DATE OF DISCHARGE:  12/15/2006                               DISCHARGE SUMMARY   ADDENDUM:   As I was sifting through the medications for this patient before  discharge, the patient told me that she does have Atacand 16 mg tablets.  Atacand is an ARB, so is Avapro.  She just spent something like $50 on  this medication, and I just could not see taking her off it, so I  suggested that she take Atacand 16 mg 2 tabs a day, which is the maximum  allowed, until she runs out and then to start on Avapro 300 mg daily,  and she said she would.  I also recommended that she go to [facility] and  see if she could get most of these medications for $4 or $8 a month  without medical coverage with prescription in her hand.  So, once again,  Atacand 16 mg 2 tabs daily until she runs out then to start Avapro 300  mg daily.      Maple Mirza, PA     GM/MEDQ  D:  12/15/2006  T:  12/16/2006  Job:  [id number]   cc:   Rollene Rotunda, MD, [facility]

## 2011-03-22 NOTE — H&P (Signed)
NAMEVAANYA, SHAMBAUGH                   ACCOUNT NO.:  [id number]   MEDICAL RECORD NO.:  [id number]          PATIENT TYPE:  INP   LOCATION:  2915                         FACILITY:  [facility]   PHYSICIAN:  Rollene Rotunda, MD, FACCDATE OF BIRTH:  [birth year]   DATE OF ADMISSION:  12/12/2006  DATE OF DISCHARGE:                              HISTORY & PHYSICAL   CHIEF COMPLAINT:  Hypertension.   HISTORY OF PRESENT ILLNESS:  Ms. Salatino is a 50 year old female patient  who presents to the office today for follow-up on her blood pressure.  She was in the office several days ago with a blood pressure 226/150.  She remained in the office for several hours and clonidine x3 and  eventually got metoprolol 5 mg IV.  She left the office with a blood  pressure of 170/120.  She was placed on Atacand and Coreg and came back  today for follow-up.  She says that she actually feels much better.  She  denies any chest pain, shortness of syncope, headache or blurred vision.  She has been checking her blood pressure at home and they have been 190s  over 120s.  She remained in the office for several hours and had  clonidine x3.  Her blood pressure did not come down like we wanted it  to.  It was 210/130 after the third dose of clonidine.  Therefore, we  decided to admit her for further treatment of her blood pressure.   PAST MEDICAL HISTORY:  Significant for hypertension as well as  cardiomyopathy with an EF of 30% in the past.  Recent echocardiogram  done today revealed an EF of 35-40% with trivial AI and mild MR.  This  study has not yet been confirmed by a physician.   PAST SURGICAL HISTORY:  Hysterectomy.   ALLERGIES:  None.   MEDICATIONS:  Atacand 16 mg daily, carvedilol 6.25 mg twice daily.   SOCIAL HISTORY:  She denies any tobacco abuse.  She has three children.   FAMILY HISTORY:  Notable for hypertension.  Her mother died of an  aneurysm in her 20s.  Her father died in his 65s of a myocardial  infarction.   REVIEW OF SYSTEMS:  Please see HPI.  Denies any fevers, chills, cough.  No hematuria or dysuria.  The rest of the review of systems are  negative.   PHYSICAL EXAMINATION:  GENERAL:  She is a well developed, well nourished female in no acute  distress.  VITAL SIGNS:  Blood pressure 214/150 on the left and 220/144 on the  right and, as noted above, after three clonidine in several hours in our  office, her blood pressure is 210/130.  HEENT:  Normocephalic and atraumatic.  PERRLA.  Sclerae clear.  Fundi  within normal bilaterally.  No papilledema.  NECK:  Without JVD or lymphadenopathy.  ENDOCRINE:  No thyromegaly.  CARDIAC:  Normal S1 and S2, regular rate and rhythm.  LUNGS: Clear to auscultation bilaterally.  ABDOMEN:  Soft, nontender.  EXTREMITIES:  No edema.  SKIN:  Warm and dry.  NEUROLOGIC:  She is alert  and oriented x3.  Cranial nerves 2-12 grossly  intact.   DATABASE:  The patient's white count on February 5 was 5400, hemoglobin  13.7, hematocrit 39.3, platelet count 298,000.  Sodium 142, potassium  3.7, glucose 104, BUN 14, creatinine 1.2.  TSH 1.51.  Urinalysis  positive for 100 mg/dL protein.   IMPRESSION:  1. Hypertensive urgency.  2. Cardiomyopathy with EF of 35-40%.  3. Normal coronaries by catheterization in 2006.   PLAN:  The patient presents to the office today for follow-up of blood  pressure.  She still has difficult to control blood pressure.  She is on  two medications and had several doses of clonidine in the office without  much relief  in her blood pressure.  She has a slight amount of protein  in the urine and is probably developing hypertensive nephrosclerosis.  She will require admission to the hospital for better control of her  blood pressure.  We will start her on IV labetalol as well as  nitroglycerin.  Hopefully, she can be discharged to home in the next 1-2  days.  We will go ahead and collect a 24-hour urine for metanephrines,   catecholamines, and VMA.  She will most likely need renal arterial  ultrasound after discharge to further evaluate her renal arteries for  stenosis.      Tereso Newcomer, PA-C      Rollene Rotunda, MD, [facility]  Electronically Signed    SW/MEDQ  D:  12/12/2006  T:  12/12/2006  Job:  [id number]

## 2011-03-22 NOTE — Discharge Summary (Signed)
[name], GLADE NO.:  [id number]   MEDICAL RECORD NO.:  [id number]          PATIENT TYPE:  INP   LOCATION:  4731                         FACILITY:  [facility]   PHYSICIAN:  Michaelyn Barter, M.D. DATE OF BIRTH:  September 23, 1961   DATE OF ADMISSION:  02/28/2005  DATE OF DISCHARGE:  03/05/2005                                 DISCHARGE SUMMARY   PRIMARY CARE PHYSICIAN:  Unassigned.   DISCHARGE DIAGNOSES:  1.  Hypertension.  2.  Congestive heart failure.  3.  Shortness of breath.  4.  Chest pain/chest tightness rule out myocardial infarction.   CONSULTATIONS:  Cardiology with [place] [facility].   PROCEDURES:  1.  Cardiac catheterization on May1,2006.  2.  2-D echocardiogram on April28,2006.   HISTORY OF PRESENT ILLNESS:  [name] is a 50 year old female who arrived  at the hospital with a chief complaint of shortness of breath on exertion  for six days accompanied by headaches. She stated that she had not been  taking her antihypertensive medications since the summer of 2005 and she  woke up on April22 with some shortness of breath which progressed over the  course of the day and was made worse by exertion.   PAST MEDICAL HISTORY:  Hypertension for five years. Patient compliance with  her medications has been poor.   PAST SURGICAL HISTORY:  Hysterectomy in [id number] for uterine fibroids.   ALLERGIES:  LATEX.   SOCIAL HISTORY:  Alcohol:  The patient drinks one glass of beer per day.  Cigarettes:  The patient denies.   FAMILY HISTORY:  The patient's mother died in her 75s from a ruptured  intracranial aneurysm. Father died in his 30s from MI.   HOSPITAL COURSE:  Problem 1:  SEVERE UNCONTROLLED HYPERTENSION WITH POSSIBLE  ENCEPHALOPATHY:  The patient was noted in the emergency room to have a blood  pressure of 234/145. The patient was admitted for further evaluation and  management of this. She received nitroglycerin, labetalol and clonidine in  the ER.  Subsequently, she was started on lisinopril 20 milligrams q.d.,  clonidine and nitroglycerin and over the course of her hospitalization her  blood pressure became better controlled.   Problem 2:  CONGESTIVE HEART FAILURE OF [place]:  The patient had an  echocardiogram completed on April28, 2006 the results of which were left  ventricle was dilated. Overall, left ventricular systolic function was  markedly decreased. Left ventricular ejection fraction was estimated to  range between 20-30%. There was severe diffuse left ventricular hypokinesis.  There was mild aortic arch dilatation. In addition, the patient had a urine  drug screen completed which was completely negative followed by cardiac  enzymes on April28, 2006 which were 0.03, 0.04 and 0.02. Therefore, they  were negative x3. Cardiology was consulted on April27, 2006. On May1, 2006,  the patient underwent a left and right heart catheterization/coronary  arteriography. The left ventriculogram was obtained in the RAD projection.  The EF was about 30-35% with global hypokinesis. The patient had normal  coronaries, moderate left ventricular dysfunction. elevated left ventricular  end-diastolic pressure, and  pulmonary capillary wedge pressure. Therefore,  the patient's congestive heart failure was managed medically, and by the  time of her discharge, she no longer complained of any chest pain or  shortness of breath.   Problem 3:  SHORTNESS OF BREATH:  The patient shortness of breath was most  likely secondary to her congestive heart failure which most likely resulted  from her severe uncontrolled hypertension. Over the course of the  hospitalization, the patient's symptoms shortness of breath resolved.   Problem 4:  CHEST PAIN/TIGHTNESS RULE OUT MYOCARDIAL INFARCTION: Again, the  patient's cardiac enzymes were noted to be negative x3 over the course of  hospitalization and her cardiac catheterization revealed normal coronaries.    The decision was made to discharge the patient home. On the date of  discharge, which was May2, 2006, the patient denied having any chest pain,  no shortness of breath, no nausea, no vomiting, no fevers or chills. Her  vitals on the date of discharge showed her temperature was 97.7, heart rate  was 64, respirations 18, blood pressure 131/93 and her O2 saturations were  99% on room air. The patient was discharged home on the following  medications.   DISCHARGE MEDICATIONS:  1.  Aldactone 25 milligrams once a day.  2.  Lisinopril 40 milligrams once a day.  3.  Toprol XL 50 milligrams once a day.  4.  Lasix 20 milligrams once a day.  5.  Clonidine 0.2 milligrams twice a day.  6.  Aspirin 81 milligrams once a day.  7.  Protonix 40 milligrams once a day.   DISCHARGE INSTRUCTIONS:  The patient was instructed that cardiology with  call her to arrange follow-up and to avoid salt in her diet.       OR/MEDQ  D:  04/28/2005  T:  04/28/2005  Job:  [id number]

## 2011-03-22 NOTE — Discharge Summary (Signed)
[name], [name]                   ACCOUNT NO.:  [id number]   MEDICAL RECORD NO.:  [id number]          PATIENT TYPE:  INP   LOCATION:  3704                         FACILITY:  [facility]   PHYSICIAN:  Rollene Rotunda, MD, FACCDATE OF BIRTH:  [birth year]   DATE OF ADMISSION:  12/12/2006  DATE OF DISCHARGE:  12/15/2006                               DISCHARGE SUMMARY   She was admitted with uncontrolled hypertension, urgency.   SECONDARY DIAGNOSES:  1. Cardiomyopathy with ejection fraction of 30%.  She had normal      coronaries at catheterization 2006.  2. Status post hysterectomy.   No procedures this admission except for titration of blood pressure  medications.   BRIEF HISTORY:  [name] is a 50 year old female.  She presents to the  office on February 8 to follow-up blood pressure.  Her blood pressure  several days ago was 226/150.  She remained in the office for several  hours and was given clonidine x3 and also metoprolol 5 mg IV.  Her blood  pressure on leaving was 170/120.  She was placed on Atacand and Coreg  and came back February 8 for follow-up.  She says she feels better.  She  denies chest pain, shortness of breath, syncope, headache or blurred  vision.  She has been checking her blood pressure at home, and they have  been 190s/120s.  She had clonidine x3 in the office again on February 8.  Her blood pressure did not subside.  After the third dose of clonidine,  it was 210 systolic over 130 diastolic.  She was admitted for further  treatment of blood pressure to [facility].   HOSPITAL COURSE:  The patient presented to [facility] February  8 from the office of [facility] for more aggressive treatment of  his recalcitrant hypertension.  She was placed on labetalol IV 20 mg  every 8 hours as well as a nitroglycerin drip, and she was started on  labetalol orally as well as hydrochlorothiazide and Atacand.  During  this admission, her blood pressure  medications required up titration  such that she is at discharge on labetalol 500 mg twice daily.  Her  Atacand has been discontinued, and she has been put on Avapro 300 mg  daily.  She is doing well with the addition of Norvasc 5 mg daily as  well as hydrochlorothiazide 25 mg daily.  Because she is on  hydrochlorothiazide, she will take potassium chloride 10 mEq daily.  Blood pressure at the time of discharge is 156 systolic.   Final medications for hypertension are as follows:  1. Hydrochlorothiazide 25 mg daily.  2. Avapro 300 mg daily.  3. Labetalol 200-mg tablets 2 in the morning and 2 in the evening.  4. K-Dur 10 mEq daily.  5. Norvasc 5 mg daily.   New medications include hydrochlorothiazide, Avapro, labetalol and  Norvasc.   She has follow-up with Dr. Antoine Poche at [facility] December 25, 2006 at 9:50 in the morning.   Complete blood count this admission on February 10:  Hemoglobin 0.9,  hematocrit 35.2, white cells 6, platelets 257.  Serum electrolytes on  February 10:  Sodium 139, potassium 3.5, chloride 107, carbonate 27, BUN  is 12, creatinine 1.15, glucose is 89.   Discharge at 35 minutes.      Maple Mirza, PA      Rollene Rotunda, MD, [facility]  Electronically Signed    GM/MEDQ  D:  12/15/2006  T:  12/16/2006  Job:  [id number]   cc:   Rollene Rotunda, MD, [facility]

## 2011-03-22 NOTE — Assessment & Plan Note (Signed)
[facility]                            CARDIOLOGY OFFICE NOTE   NAME:[name], [name]                          MRN:          [id number]  DATE:12/08/2006                            DOB:          [birth year]    PRIMARY CARE PHYSICIAN:  None.   REASON FOR PRESENTATION:  Evaluate patient with difficult-to-control  hypertension.   HISTORY OF PRESENT ILLNESS:  The patient presents for followup of  hypertension.  She actually was only seen by [place] in 2006.  At that time,  she had a cardiomyopathy with an EF of 30%.  She had no coronary disease  on catheterization.  It was felt that her cardiomyopathy was related to  her difficult-to-control hypertension.  She did have an appointment  scheduled with [place] in July 2006, but did not show and has not been seen  in followup.  She has apparently been followed by a primary care  physician in town who has managed her with varying combinations of  antihypertensives but the patient reports without significant success.  She is now presenting for followup of this.  She actually gets along  pretty well.  She does not exercise, but she does work and do her house  cleaning.  She says that she can climb a couple of flights of stairs  without getting short of breath.  She does not describe any chest  discomfort, neck discomfort, arm discomfort, activity-induced nausea,  vomiting, excessive diaphoresis.  She has had no palpitations,  presyncope, or syncope.  She denies any PND or orthopnea.  She has not  had any blurred vision, speech or motor problems.   PAST MEDICAL HISTORY:  1. Hypertension times approximately 7 years.  2. Cardiomyopathy as described.   PAST SURGICAL HISTORY:  Hysterectomy.   ALLERGIES:  NONE.   MEDICATIONS:  None.   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  She does not smoke.  She has 3 children.   FAMILY HISTORY:  Contributory for hypertension.  Her mother died  in her  60s of an intracranial aneurysm.  Her father died in his 4s of a  myocardial infarction.   PHYSICAL EXAMINATION:  GENERAL:  The patient is in no distress.  VITAL SIGNS:  Blood pressure initially 226/150.  HEENT:  Eyes unremarkable.  Pupils are equal, round, and reactive to  light.  Fundi examined carefully and there is no papilledema.  There is  no obvious AV nicking or copper wiring.  Oral  NECK:  No jugular venous distention at 45 degrees.  Carotid upstroke  brisk and symmetric.  No bruits.  No thyromegaly.  LYMPHATICS:  No cervical, axillary, inguinal adenopathy.  LUNGS:  Clear to auscultation bilaterally.  BACK:  No costovertebral angle tenderness.  CHEST:  Unremarkable.  HEART:  PMI not displaced or sustained.  S1 and S2 within normal limits.  No S3.  No S4.  No clicks.  No rubs.  No murmurs.  ABDOMEN:  Obese, positive bowel sounds normal in frequency and pitch.  No bruits.  No rebound.  No  guarding.  No midline pulsatile mass.  No  hepatomegaly.  No splenomegaly.  SKIN:  No rashes.  No nodules.  EXTREMITIES:  With 2+ pulses throughout.  No edema.  No cyanosis.  No  clubbing.  NEUROLOGIC:  Oriented to person, place and time.  Cranial nerves II-XII  grossly intact.  Motor grossly intact.   EKG:  Sinus rhythm, rate 92, axis within normal limits, intervals within  normal limits, no acute ST-T wave changes.   ASSESSMENT/PLAN:  1. Hypertension.  We kept the patient in the office for about 4 hours.      We treated her with clonidine 0.1 mg x3.  We eventually gave her      metoprolol 5 mg intravenously.  Her blood pressure eventually came      down to a systolic of 170.  Her diastolic reading was elevated at      120.  I am going  to start Atacand 16 mg daily and Coreg 6.25 mg      twice a day.  We are going to get blood work to include a C-MET,      TSH, CBC.  She is also going to have a urinalysis.  2. Cardiomyopathy.  I am going to get an echocardiogram to further       evaluate her ejection fraction.  Management of this will be in the      context of treating her difficult-to-control hypertension.  3. Followup.  She is going to come back in on Friday to be seen for      further blood pressure check and medication titration.     Rollene Rotunda, MD, [facility]  Electronically Signed    JH/MedQ  DD: 12/08/2006  DT: 12/08/2006  Job #: [id number]

## 2011-03-22 NOTE — Discharge Summary (Signed)
NAMERICHELE, STRAND                   ACCOUNT NO.:  [id number]   MEDICAL RECORD NO.:  [id number]          PATIENT TYPE:  INP   LOCATION:  3704                         FACILITY:  [facility]   PHYSICIAN:  Learta Codding, MD,FACC DATE OF BIRTH:  [birth year]   DATE OF ADMISSION:  12/12/2006  DATE OF DISCHARGE:  12/14/2006                               DISCHARGE SUMMARY   PROCEDURES:  None.   DISCHARGE DIAGNOSES:  1. Malignant hypertension.  2. Obesity.   SECONDARY DIAGNOSES:  1. Status post hysterectomy.  2. 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 Hypokalemia.  4. Allergy or intolerance to LATEX  5. Family history of coronary artery disease in her father and      aneurysm in her mother.  6. Chronic systolic congestive heart failure (euvolemic).   TIME AT DISCHARGE:  41 minutes.   HOSPITAL COURSE:  Ms. Gaetz is a 50 year old female with a history of  poorly controlled hypertension.  She was in the office with a blood  pressure 226/150 which responded to oral medications.  When she came  back for followup, her blood pressure was significantly elevated at  210/130 after clonidine.  She was admitted for further evaluation.   She had been on Coreg and Atacand.  The Atacand was replaced with  labetalol, and the Atacand was replaced with Avapro which was up  titrated to maximum dose.  The labetalol was up titrated to 300 mg  b.i.d..  She had hydrochlorothiazide added to her medication regimen.  Her potassium was supplemented.   On December 14, 2006, her diastolic is currently 102.  However, her  medications have been increased, and, if she responds well to those, she  will be discharged today with outpatient followup arranged.   DISCHARGE INSTRUCTIONS:  1. Her activity level is to be increased gradually.  2. She is to stick to a low-sodium, heart healthy diet.  3. She is to call our office if  her diastolic goes over 140 or her      systolic goes over 250.  4. She is to follow up in the office on the February 21 at 9:15 with      Dr. Jenene Slicker PA or NP and get a BMET at that time.   DISCHARGE MEDICATIONS:  1. Hydrochlorothiazide 25 mg daily  2. Avapro 300 mg a day.  3. Labetalol 300 mg b.i.d.  4. K-Dur 10 mEq a day.      Theodore Demark, PA-C      Learta Codding, MD,FACC  Electronically Signed    RB/MEDQ  D:  12/14/2006  T:  12/14/2006  Job:  [id number]   cc:   Sherry A. Rosalio Macadamia, M.D.

## 2011-03-22 NOTE — Cardiovascular Report (Signed)
[name], [name]                   ACCOUNT NO.:  [id number]   MEDICAL RECORD NO.:  [id number]          PATIENT TYPE:  INP   LOCATION:  4731                         FACILITY:  [facility]   PHYSICIAN:  Rollene Rotunda, M.D.   DATE OF BIRTH:  1960-12-06   DATE OF PROCEDURE:  03/04/2005  DATE OF DISCHARGE:                              CARDIAC CATHETERIZATION   PROCEDURE:  Left and right heart catheterization/coronary arteriography.   INDICATIONS FOR PROCEDURE:  Patient with cardiomyopathy.   PROCEDURE NOTE:  Left heart catheterization was performed via the right  femoral artery, right heart catheterization was performed via the right  femoral vein.  Both vessels were cannulated using anterior wall puncture.  A  #6 French arterial sheath was inserted via the modified Seldinger technique,  a #7 French venous sheath was inserted via modified Seldinger technique.  Preformed Judkins and pigtail catheter and Swan-Ganz catheter were utilized.  The patient tolerated the procedure well and left the lab in stable  condition.   RESULTS:  Hemodynamics:  RA mean 8, RV 44/70, PA 41/22 with a mean of 32,  pulmonary capillary wedge pressure mean of 20, cardiac output/cardiac index  5.5/2.2 (FICK), LV 151/29, AO 156/113.   Distal aortogram was obtained secondary significant hypertension.  There did  not appear to be any significant renal artery stenosis.  There was aortic  disease, either.   Coronaries:  The left main was normal.  The LAD was large and wrapped the  apex and was normal.  The first diagonal was small and normal.  The second  diagonal was moderate size and normal.  The circumflex and the AV groove was  somewhat small but normal.  There was a very large ramus intermediate which  was normal.  OM1 was small and normal.  The right coronary artery was a  dominant vessel though not particularly large.  It was normal throughout its  course.  There was a PDA which was normal.   Left ventriculogram:   The left ventriculogram was obtained in the RAO  projection.  The EF was about 30-35% with global hypokinesis.   CONCLUSION:  Normal coronaries.  Moderate left ventricular  dysfunction.  Elevated left ventricular  end diastolic pressure and pulmonary capillary  wedge pressure.   PLAN:  The patient will have medical management of her hypertension      JH/MEDQ  D:  03/04/2005  T:  03/04/2005  Job:  [id number]

## 2011-03-22 NOTE — H&P (Signed)
[name], [name] NO.:  [id number]   MEDICAL RECORD NO.:  [id number]          PATIENT TYPE:  EMS   LOCATION:  MAJO                         FACILITY:  [facility]   PHYSICIAN:  Isidor Holts, M.D.  DATE OF BIRTH:  1961-09-01   DATE OF ADMISSION:  02/28/2005  DATE OF DISCHARGE:                                HISTORY & PHYSICAL   PRIMARY MEDICAL DOCTOR:  Unassigned.   CHIEF COMPLAINT:  Shortness of breath on exertion for 6 days, and also  intermittent headaches for 6 days.   HISTORY OF PRESENT ILLNESS:  This is a 50 year old female, with known  history of hypertension for the past 5 years; however, she has not been on  any antihypertensive medication since mid-summer 2005.  On February 23, 2005,  she became quite short of breath on waking up in the morning.  This was  worsened by exertion, so much so that she got short of breath on negotiating  only one flight of stairs.  The shortness of breath was associated with  central chest tightness radiating into her neck.  Denies nausea or  diaphoresis.  Usually, the discomfort lasts about 10 minutes, and is  relieved by rest.  Denies ankle swelling.  She has also had intermittent  frontal headaches over the past 6 days.  Denies blurring of vision.  As the  shortness of breath was becoming progressively worse, she talked to her  supervisor at work, who is an R.N., and who recommended an emergency  department visit.   PAST MEDICAL HISTORY:  1.  Hypertension for 5 years.  2.  Status post hysterectomy in July 2004 for uterine fibroids.   MEDICATIONS:  None regular.   ALLERGIES:  LATEX.   SYSTEMS REVIEW:  As per HPI and chief complaint.  Otherwise, negative.   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  She is single.  Nonsmoker.  Drinks one glass of beer  per day.  Denies drug abuse.  She has 3 offspring.  Her oldest daughter is  hypertensive.  The other two are alive and  well.   FAMILY HISTORY:  She has one brother who is hypertensive.  Two sisters, one  of whom has SLE, the other has respiratory problems.  Her mother died in her  late 22s from a ruptured intracranial aneurysm.  Her father died in his late  38`s status post MI.   PHYSICAL EXAMINATION:  VITAL SIGNS:  Temperature 99.3, pulse initially 120  per minute, subsequently 89 per minute, respiratory rate 20, blood pressure  initial 234/145; after treatment with nitroglycerin, clonidine, and  Labetalol, it is now 176/105.  Pulse oximetry is 99% on room air.  GENERAL:  The patient appears quite comfortable, alert, communicative, not  short of breath at rest.  Has only a mild headache at this time.  Denies  chest pain.  HEENT:  No pallor, no jaundice, no conjunctival injection.  Throat is quite  clear.  NECK:  Supple.  JVP not seen.  No palpable lymphadenopathy.  No palpable  goiter.  No carotid bruits.  CHEST:  Clinically clear to auscultation.  No wheezes, no crackles.  HEART:  Heart sounds 1 and 2 heard.  Normal rhythm.  No murmurs.  ABDOMEN:  Moderately obese, soft, and nontender.  There is no palpable  organomegaly, no palpable masses.  Normal bowel sounds.  EXTREMITIES:  Lower extremity examination revealed no pitting edema.  Palpable peripheral pulses.  MUSCULOSKELETAL:  No abnormality is discovered.  CENTRAL NERVOUS SYSTEM:  No focal neurologic deficit on gross examination.   INVESTIGATIONS:  CBC - WBC of 6.8, hemoglobin 12.5, hematocrit 36.9,  platelets 263.  Electrolytes revealed a sodium of 139, potassium 3.3,  chloride 110, CO2 of 24, BUN 12, creatinine 1.2, glucose 89.  BNP of 233.3.  Troponin I less than 0.05 x2 at point of care.  Chest x-ray revealed  cardiomegaly, but no pulmonary edema or infiltrates.  EKG revealed sinus  rhythm, regular, 98 per minute.  Old Q waves in V1 and V3.  No acute  ischemic changes.   ASSESSMENT AND PLAN:  1.  Severe uncontrolled hypertension with  possible encephalopathy, secondary      to noncompliance with antihypertensive medications.  This has improved      after nitroglycerin, labetalol, and clonidine in the emergency      department.  We shall start an ACE inhibitor and p.r.n. clonidine, and      admit for telemetric monitoring.   1.  Shortness of breath on exertion, likely secondary to incipient left      ventricle failure secondary to severe uncontrolled hypertension.      Exercise tolerance should improve with improved blood pressure control.      It is noted that the chest x-ray shows cardiomegaly, but no edema, and      also there is no evidence of clinical congestive heart failure.  For      completeness, we will cycle cardiac enzymes, and will also arrange a 2-D      echocardiogram.   1.  Chest tightness.  It is important to rule out coronary artery      disease/angina.  The patient will likely require stress testing.  We      have requested a cardiology consultation, which has been kindly provided      by [facility] (i.e. Dr. Dorethea Clan).  It is important to assess cardiovascular risk factors.  Will arrange a lipid  profile.  Will also do a TSH.   Further management will depend on clinical course.      CO/MEDQ  D:  02/28/2005  T:  03/01/2005  Job:  [id number]   cc:   Vida Roller, M.D.  Fax: [phone]

## 2011-04-26 ENCOUNTER — Emergency Department (HOSPITAL_COMMUNITY)
Admission: EM | Admit: 2011-04-26 | Discharge: 2011-04-26 | Disposition: A | Discharge status: Home / Self Care | Payer: Self-pay | Attending: Emergency Medicine | Admitting: Emergency Medicine

## 2011-04-26 DIAGNOSIS — Z79899 Other long term (current) drug therapy: Secondary | ICD-10-CM | POA: Insufficient documentation

## 2011-04-26 DIAGNOSIS — M5430 Sciatica, unspecified side: Secondary | ICD-10-CM | POA: Insufficient documentation

## 2011-04-26 DIAGNOSIS — M79609 Pain in unspecified limb: Secondary | ICD-10-CM | POA: Insufficient documentation

## 2011-04-26 DIAGNOSIS — I10 Essential (primary) hypertension: Secondary | ICD-10-CM | POA: Insufficient documentation

## 2011-04-26 DIAGNOSIS — Z91199 Patient's noncompliance with other medical treatment and regimen due to unspecified reason: Secondary | ICD-10-CM | POA: Insufficient documentation

## 2011-04-26 DIAGNOSIS — M545 Low back pain, unspecified: Secondary | ICD-10-CM | POA: Insufficient documentation

## 2011-04-26 DIAGNOSIS — M25559 Pain in unspecified hip: Secondary | ICD-10-CM | POA: Insufficient documentation

## 2011-04-26 DIAGNOSIS — Z9119 Patient's noncompliance with other medical treatment and regimen: Secondary | ICD-10-CM | POA: Insufficient documentation

## 2011-07-31 LAB — POCT CARDIAC MARKERS
CKMB, poc: 1.2
Myoglobin, poc: 78.8
Operator id: 257131
Operator id: 257131
Troponin i, poc: <0.05

## 2011-07-31 LAB — DIFFERENTIAL
Basophils Absolute: 0.1
Eosinophils Relative: 2
Lymphocytes Relative: 24
Lymphs Abs: 1.5
Monocytes Relative: 8
Neutrophils Relative %: 65

## 2011-07-31 LAB — CBC
HCT: 36.5
MCHC: 34.3
MCV: 89
RBC: 4.1

## 2011-07-31 LAB — CK TOTAL AND CKMB (NOT AT ARMC)
CK, MB: 0.9
Relative Index: 0.6
Total CK: 144

## 2011-07-31 LAB — URINALYSIS, ROUTINE W REFLEX MICROSCOPIC
Ketones, ur: NEGATIVE
Leukocytes, UA: NEGATIVE
Nitrite: NEGATIVE
Specific Gravity, Urine: 1.026
Urobilinogen, UA: 0.2
pH: 5

## 2011-07-31 LAB — URINE MICROSCOPIC-ADD ON

## 2011-07-31 LAB — COMPREHENSIVE METABOLIC PANEL
AST: 21
BUN: 16
CO2: 24
Calcium: 8.6
Chloride: 103
Creatinine, Ser: 1.2
GFR calc Af Amer: 59 — ABNORMAL LOW
GFR calc non Af Amer: 49 — ABNORMAL LOW
Total Bilirubin: 0.7

## 2011-07-31 LAB — CARDIAC PANEL(CRET KIN+CKTOT+MB+TROPI)
CK, MB: 0.7
Relative Index: 0.6

## 2011-07-31 LAB — TSH: TSH: 1.335

## 2011-07-31 LAB — LIPID PANEL
Cholesterol: 200
HDL: 42
LDL Cholesterol: 147 — ABNORMAL HIGH
Triglycerides: 56

## 2011-07-31 LAB — PROTIME-INR
INR: 1
Prothrombin Time: 13.5

## 2011-07-31 LAB — D-DIMER, QUANTITATIVE: D-Dimer, Quant: 0.29

## 2011-08-05 LAB — POCT CARDIAC MARKERS: Troponin i, poc: <0.05

## 2011-08-05 LAB — POCT I-STAT, CHEM 8
Glucose, Bld: 106 — ABNORMAL HIGH
HCT: 37
Hemoglobin: 12.6
Potassium: 3.5

## 2011-08-05 LAB — DIFFERENTIAL
Basophils Relative: 0
Eosinophils Absolute: 0.2
Monocytes Relative: 12
Neutrophils Relative %: 52

## 2011-08-05 LAB — CBC
MCHC: 33.9
MCV: 90.1
Platelets: 291
RBC: 4.08
RDW: 13.5

## 2011-11-15 ENCOUNTER — Telehealth: Payer: Self-pay | Admitting: Cardiology

## 2011-11-15 NOTE — Telephone Encounter (Signed)
Metoprolol, lisinopril, norvasc, hctz, and spironolactone refills needed, uses [facility]

## 2011-11-18 MED ORDER — METOPROLOL TARTRATE 50 MG PO TABS: 50.0000 mg | ORAL_TABLET | Freq: Three times a day (TID) | ORAL | Status: DC

## 2011-11-18 MED ORDER — LISINOPRIL 40 MG PO TABS: 40.0000 mg | ORAL_TABLET | Freq: Every day | ORAL | Status: DC

## 2011-11-18 MED ORDER — HYDROCHLOROTHIAZIDE 25 MG PO TABS: 25.0000 mg | ORAL_TABLET | Freq: Every day | ORAL | Status: DC

## 2011-11-18 MED ORDER — AMLODIPINE BESYLATE 10 MG PO TABS: 10.0000 mg | ORAL_TABLET | Freq: Every day | ORAL | Status: DC

## 2011-11-18 MED ORDER — SPIRONOLACTONE 50 MG PO TABS: 50.0000 mg | ORAL_TABLET | Freq: Every day | ORAL | Status: DC

## 2011-11-18 NOTE — Telephone Encounter (Signed)
RX sent into pharmacy. Tried to call pt, number disconnectted

## 2011-11-28 ENCOUNTER — Ambulatory Visit: Payer: Self-pay | Admitting: Cardiology

## 2011-12-03 ENCOUNTER — Encounter: Payer: Self-pay | Admitting: Cardiology

## 2011-12-05 ENCOUNTER — Ambulatory Visit: Payer: Self-pay | Admitting: Cardiology

## 2012-02-10 ENCOUNTER — Emergency Department (HOSPITAL_BASED_OUTPATIENT_CLINIC_OR_DEPARTMENT_OTHER)
Admission: EM | Admit: 2012-02-10 | Discharge: 2012-02-10 | Disposition: A | Discharge status: Home / Self Care | Payer: Managed Care, Other (non HMO) | Attending: Emergency Medicine | Admitting: Emergency Medicine

## 2012-02-10 ENCOUNTER — Encounter (HOSPITAL_BASED_OUTPATIENT_CLINIC_OR_DEPARTMENT_OTHER): Payer: Self-pay

## 2012-02-10 ENCOUNTER — Emergency Department (INDEPENDENT_AMBULATORY_CARE_PROVIDER_SITE_OTHER): Payer: Managed Care, Other (non HMO)

## 2012-02-10 DIAGNOSIS — X58XXXA Exposure to other specified factors, initial encounter: Secondary | ICD-10-CM | POA: Insufficient documentation

## 2012-02-10 DIAGNOSIS — M549 Dorsalgia, unspecified: Secondary | ICD-10-CM | POA: Insufficient documentation

## 2012-02-10 DIAGNOSIS — S335XXA Sprain of ligaments of lumbar spine, initial encounter: Secondary | ICD-10-CM | POA: Insufficient documentation

## 2012-02-10 DIAGNOSIS — I10 Essential (primary) hypertension: Secondary | ICD-10-CM | POA: Insufficient documentation

## 2012-02-10 DIAGNOSIS — M5431 Sciatica, right side: Secondary | ICD-10-CM

## 2012-02-10 DIAGNOSIS — M5430 Sciatica, unspecified side: Secondary | ICD-10-CM | POA: Insufficient documentation

## 2012-02-10 DIAGNOSIS — M62838 Other muscle spasm: Secondary | ICD-10-CM | POA: Insufficient documentation

## 2012-02-10 DIAGNOSIS — I428 Other cardiomyopathies: Secondary | ICD-10-CM | POA: Insufficient documentation

## 2012-02-10 DIAGNOSIS — S39012A Strain of muscle, fascia and tendon of lower back, initial encounter: Secondary | ICD-10-CM

## 2012-02-10 HISTORY — DX: Sciatica, unspecified side: M54.30

## 2012-02-10 MED ORDER — HYDROMORPHONE HCL PF 2 MG/ML IJ SOLN
2.0000 mg | Freq: Once | INTRAMUSCULAR | Status: AC
  Administered 2012-02-10: 2 mg via INTRAMUSCULAR
  Filled 2012-02-10: qty 1

## 2012-02-10 MED ORDER — OXYCODONE-ACETAMINOPHEN 5-325 MG PO TABS: 2.0000 | ORAL_TABLET | ORAL | Status: AC | PRN

## 2012-02-10 MED ORDER — IBUPROFEN 400 MG PO TABS: 400.0000 mg | ORAL_TABLET | Freq: Three times a day (TID) | ORAL | Status: AC | PRN

## 2012-02-10 MED ORDER — DIAZEPAM 5 MG PO TABS: 5.0000 mg | ORAL_TABLET | Freq: Four times a day (QID) | ORAL | Status: AC | PRN

## 2012-02-10 MED ORDER — DIAZEPAM 5 MG PO TABS
5.0000 mg | ORAL_TABLET | Freq: Once | ORAL | Status: AC
  Administered 2012-02-10: 5 mg via ORAL
  Filled 2012-02-10: qty 1

## 2012-02-10 MED ORDER — DEXAMETHASONE SODIUM PHOSPHATE 10 MG/ML IJ SOLN
10.0000 mg | Freq: Once | INTRAMUSCULAR | Status: AC
  Administered 2012-02-10: 10 mg via INTRAMUSCULAR
  Filled 2012-02-10: qty 1

## 2012-02-10 NOTE — ED Notes (Signed)
rx x 2 given for percocet and valium- pt's daughter present to drive

## 2012-02-10 NOTE — Discharge Instructions (Signed)
Back Exercises Back exercises help treat and prevent back injuries. The goal of back exercises is to increase the strength of your abdominal and back muscles and the flexibility of your back. These exercises should be started when you no longer have back pain. Back exercises include:  Pelvic Tilt. Lie on your back with your knees bent. Tilt your pelvis until the lower part of your back is against the floor. Hold this position 5 to 10 sec and repeat 5 to 10 times.   Knee to Chest. Pull first 1 knee up against your chest and hold for 20 to 30 seconds, repeat this with the other knee, and then both knees. This may be done with the other leg straight or bent, whichever feels better.   Sit-Ups or Curl-Ups. Bend your knees 90 degrees. Start with tilting your pelvis, and do a partial, slow sit-up, lifting your trunk only 30 to 45 degrees off the floor. Take at least 2 to 3 seconds for each sit-up. Do not do sit-ups with your knees out straight. If partial sit-ups are difficult, simply do the above but with only tightening your abdominal muscles and holding it as directed.   Hip-Lift. Lie on your back with your knees flexed 90 degrees. Push down with your feet and shoulders as you raise your hips a couple inches off the floor; hold for 10 seconds, repeat 5 to 10 times.   Back arches. Lie on your stomach, propping yourself up on bent elbows. Slowly press on your hands, causing an arch in your low back. Repeat 3 to 5 times. Any initial stiffness and discomfort should lessen with repetition over time.   Shoulder-Lifts. Lie face down with arms beside your body. Keep hips and torso pressed to floor as you slowly lift your head and shoulders off the floor.  Do not overdo your exercises, especially in the beginning. Exercises may cause you some mild back discomfort which lasts for a few minutes; however, if the pain is more severe, or lasts for more than 15 minutes, do not continue exercises until you see your  caregiver. Improvement with exercise therapy for back problems is slow.  See your caregivers for assistance with developing a proper back exercise program. Document Released: 11/28/2004 Document Revised: 10/10/2011 Document Reviewed: 10/21/2005 [facility] Patient Information 2012 [place].Back Pain, Adult Low back pain is very common. About 1 in 5 people have back pain.The cause of low back pain is rarely dangerous. The pain often gets better over time.About half of people with a sudden onset of back pain feel better in just 2 weeks. About 8 in 10 people feel better by 6 weeks.  CAUSES Some common causes of back pain include:  Strain of the muscles or ligaments supporting the spine.   Wear and tear (degeneration) of the spinal discs.   Arthritis.   Direct injury to the back.  DIAGNOSIS Most of the time, the direct cause of low back pain is not known.However, back pain can be treated effectively even when the exact cause of the pain is unknown.Answering your caregiver's questions about your overall health and symptoms is one of the most accurate ways to make sure the cause of your pain is not dangerous. If your caregiver needs more information, he or she may order lab work or imaging tests (X-rays or MRIs).However, even if imaging tests show changes in your back, this usually does not require surgery. HOME CARE INSTRUCTIONS For many people, back pain returns.Since low back pain is rarely  dangerous, it is often a condition that people can learn to [facility] their own.   Remain active. It is stressful on the back to sit or stand in one place. Do not sit, drive, or stand in one place for more than 30 minutes at a time. Take short walks on level surfaces as soon as pain allows.Try to increase the length of time you walk each day.   Do not stay in bed.Resting more than 1 or 2 days can delay your recovery.   Do not avoid exercise or work.Your body is made to move.It is not dangerous  to be active, even though your back may hurt.Your back will likely heal faster if you return to being active before your pain is gone.   Pay attention to your body when you bend and lift. Many people have less discomfortwhen lifting if they bend their knees, keep the load close to their bodies,and avoid twisting. Often, the most comfortable positions are those that put less stress on your recovering back.   Find a comfortable position to sleep. Use a firm mattress and lie on your side with your knees slightly bent. If you lie on your back, put a pillow under your knees.   Only take over-the-counter or prescription medicines as directed by your caregiver. Over-the-counter medicines to reduce pain and inflammation are often the most helpful.Your caregiver may prescribe muscle relaxant drugs.These medicines help dull your pain so you can more quickly return to your normal activities and healthy exercise.   Put ice on the injured area.   Put ice in a plastic bag.   Place a towel between your skin and the bag.   Leave the ice on for 15 to 20 minutes, 3 to 4 times a day for the first 2 to 3 days. After that, ice and heat may be alternated to reduce pain and spasms.   Ask your caregiver about trying back exercises and gentle massage. This may be of some benefit.   Avoid feeling anxious or stressed.Stress increases muscle tension and can worsen back pain.It is important to recognize when you are anxious or stressed and learn ways to manage it.Exercise is a great option.  SEEK MEDICAL CARE IF:  You have pain that is not relieved with rest or medicine.   You have pain that does not improve in 1 week.   You have new symptoms.   You are generally not feeling well.  SEEK IMMEDIATE MEDICAL CARE IF:   You have pain that radiates from your back into your legs.   You develop new bowel or bladder control problems.   You have unusual weakness or numbness in your arms or legs.   You develop  nausea or vomiting.   You develop abdominal pain.   You feel faint.  Document Released: 10/21/2005 Document Revised: 10/10/2011 Document Reviewed: 03/11/2011 [facility] Patient Information 2012 [place], [place].Cryotherapy Cryotherapy means treatment with cold. Ice or gel packs can be used to reduce both pain and swelling. Ice is the most helpful within the first 24 to 48 hours after an injury or flareup from overusing a muscle or joint. Sprains, strains, spasms, burning pain, shooting pain, and aches can all be eased with ice. Ice can also be used when recovering from surgery. Ice is effective, has very few side effects, and is safe for most people to use. PRECAUTIONS  Ice is not a safe treatment option for people with:  Raynaud's phenomenon. This is a condition affecting small blood vessels in  the extremities. Exposure to cold may cause your problems to return.   Cold hypersensitivity. There are many forms of cold hypersensitivity, including:   Cold urticaria. Red, itchy hives appear on the skin when the tissues begin to warm after being iced.   Cold erythema. This is a red, itchy rash caused by exposure to cold.   Cold hemoglobinuria. Red blood cells break down when the tissues begin to warm after being iced. The hemoglobin that carry oxygen are passed into the urine because they cannot combine with blood proteins fast enough.   Numbness or altered sensitivity in the area being iced.  If you have any of the following conditions, do not use ice until you have discussed cryotherapy with your caregiver:  Heart conditions, such as arrhythmia, angina, or chronic heart disease.   High blood pressure.   Healing wounds or open skin in the area being iced.   Current infections.   Rheumatoid arthritis.   Poor circulation.   Diabetes.  Ice slows the blood flow in the region it is applied. This is beneficial when trying to stop inflamed tissues from spreading irritating chemicals to  surrounding tissues. However, if you expose your skin to cold temperatures for too long or without the proper protection, you can damage your skin or nerves. Watch for signs of skin damage due to cold. HOME CARE INSTRUCTIONS Follow these tips to use ice and cold packs safely.  Place a dry or damp towel between the ice and skin. A damp towel will cool the skin more quickly, so you may need to shorten the time that the ice is used.   For a more rapid response, add gentle compression to the ice.   Ice for no more than 10 to 20 minutes at a time. The bonier the area you are icing, the less time it will take to get the benefits of ice.   Check your skin after 5 minutes to make sure there are no signs of a poor response to cold or skin damage.   Rest 20 minutes or more in between uses.   Once your skin is numb, you can end your treatment. You can test numbness by very lightly touching your skin. The touch should be so light that you do not see the skin dimple from the pressure of your fingertip. When using ice, most people will feel these normal sensations in this order: cold, burning, aching, and numbness.   Do not use ice on someone who cannot communicate their responses to pain, such as small children or people with dementia.  HOW TO MAKE AN ICE PACK Ice packs are the most common way to use ice therapy. Other methods include ice massage, ice baths, and cryo-sprays. Muscle creams that cause a cold, tingly feeling do not offer the same benefits that ice offers and should not be used as a substitute unless recommended by your caregiver. To make an ice pack, do one of the following:  Place crushed ice or a bag of frozen vegetables in a sealable plastic bag. Squeeze out the excess air. Place this bag inside another plastic bag. Slide the bag into a pillowcase or place a damp towel between your skin and the bag.   Mix 3 parts water with 1 part rubbing alcohol. Freeze the mixture in a sealable plastic  bag. When you remove the mixture from the freezer, it will be slushy. Squeeze out the excess air. Place this bag inside another plastic bag. Slide the bag  into a pillowcase or place a damp towel between your skin and the bag.  SEEK MEDICAL CARE IF:  You develop white spots on your skin. This may give the skin a blotchy (mottled) appearance.   Your skin turns blue or pale.   Your skin becomes waxy or hard.   Your swelling gets worse.  MAKE SURE YOU:   Understand these instructions.   Will watch your condition.   Will get help right away if you are not doing well or get worse.  Document Released: 06/17/2011 Document Revised: 10/10/2011 Document Reviewed: 06/17/2011 [facility] Patient Information 2012 [place], [place].Lumbosacral Strain Lumbosacral strain is one of the most common causes of back pain. There are many causes of back pain. Most are not serious conditions. CAUSES  Your backbone (spinal column) is made up of 24 main vertebral bodies, the sacrum, and the coccyx. These are held together by muscles and tough, fibrous tissue (ligaments). Nerve roots pass through the openings between the vertebrae. A sudden move or injury to the back may cause injury to, or pressure on, these nerves. This may result in localized back pain or pain movement (radiation) into the buttocks, down the leg, and into the foot. Sharp, shooting pain from the buttock down the back of the leg (sciatica) is frequently associated with a ruptured (herniated) disk. Pain may be caused by muscle spasm alone. Your caregiver can often find the cause of your pain by the details of your symptoms and an exam. In some cases, you may need tests (such as X-rays). Your caregiver will work with you to decide if any tests are needed based on your specific exam. HOME CARE INSTRUCTIONS   Avoid an underactive lifestyle. Active exercise, as directed by your caregiver, is your greatest weapon against back pain.   Avoid hard physical  activities (tennis, racquetball, waterskiing) if you are not in proper physical condition for it. This may aggravate or create problems.   If you have a back problem, avoid sports requiring sudden body movements. Swimming and walking are generally safer activities.   Maintain good posture.   Avoid becoming overweight (obese).   Use bed rest for only the most extreme, sudden (acute) episode. Your caregiver will help you determine how much bed rest is necessary.   For acute conditions, you may put ice on the injured area.   Put ice in a plastic bag.   Place a towel between your skin and the bag.   Leave the ice on for 15 to 20 minutes at a time, every 2 hours, or as needed.   After you are improved and more active, it may help to apply heat for 30 minutes before activities.  See your caregiver if you are having pain that lasts longer than expected. Your caregiver can advise appropriate exercises or therapy if needed. With conditioning, most back problems can be avoided. SEEK IMMEDIATE MEDICAL CARE IF:   You have numbness, tingling, weakness, or problems with the use of your arms or legs.   You experience severe back pain not relieved with medicines.   There is a change in bowel or bladder control.   You have increasing pain in any area of the body, including your belly (abdomen).   You notice shortness of breath, dizziness, or feel faint.   You feel sick to your stomach (nauseous), are throwing up (vomiting), or become sweaty.   You notice discoloration of your toes or legs, or your feet get very cold.   Your back  pain is getting worse.   You have a fever.  MAKE SURE YOU:   Understand these instructions.   Will watch your condition.   Will get help right away if you are not doing well or get worse.  Document Released: 07/31/2005 Document Revised: 10/10/2011 Document Reviewed: 01/20/2009 [facility] Patient Information 2012 [place], [place].Sciatica Sciatica is a weakness  and/or changes in sensation (tingling, jolts, hot and cold, numbness) along the path the sciatic nerve travels. Irritation or damage to lumbar nerve roots is often also referred to as lumbar radiculopathy.  Lumbar radiculopathy (Sciatica) is the most common form of this problem. Radiculopathy can occur in any of the nerves coming out of the spinal cord. The problems caused depend on which nerves are involved. The sciatic nerve is the large nerve supplying the branches of nerves going from the hip to the toes. It often causes a numbness or weakness in the skin and/or muscles that the sciatic nerve serves. It also may cause symptoms (problems) of pain, burning, tingling, or electric shock-like feelings in the path of this nerve. This usually comes from injury to the fibers that make up the sciatic nerve. Some of these symptoms are low back pain and/or unpleasant feelings in the following areas:  From the mid-buttock down the back of the leg to the back of the knee.   And/or the outside of the calf and top of the foot.   And/or behind the inner ankle to the sole of the foot.  CAUSES   Herniated or slipped disc. Discs are the little cushions between the bones in the back.   Pressure by the piriformis muscle in the buttock on the sciatic nerve (Piriformis Syndrome).   Misalignment of the bones in the lower back and buttocks (Sacroiliac Joint Derangement).   Narrowing of the spinal canal that puts pressure on or pinches the fibers that make up the sciatic nerve.   A slipped vertebra that is out of line with those above or beneath it.   Abnormality of the nervous system itself so that nerve fibers do not transmit signals properly, especially to feet and calves (neuropathy).   Tumor (this is rare).  Your caregiver can usually determine the cause of your sciatica and begin the treatment most likely to help you. TREATMENT  Taking over-the-counter painkillers, physical therapy, rest, exercise, spinal  manipulation, and injections of anesthetics and/or steroids may be used. Surgery, acupuncture, and Yoga can also be effective. Mind over matter techniques, mental imagery, and changing factors such as your bed, chair, desk height, posture, and activities are other treatments that may be helpful. You and your caregiver can help determine what is best for you. With proper diagnosis, the cause of most sciatica can be identified and removed. Communication and cooperation between your caregiver and you is essential. If you are not successful immediately, do not be discouraged. With time, a proper treatment can be found that will make you comfortable. HOME CARE INSTRUCTIONS   If the pain is coming from a problem in the back, applying ice to that area for 15 to 20 minutes, 3 to 4 times per day while awake, may be helpful. Put the ice in a plastic bag. Place a towel between the bag of ice and your skin.   You may exercise or perform your usual activities if these do not aggravate your pain, or as suggested by your caregiver.   Only take over-the-counter or prescription medicines for pain, discomfort, or fever as directed by your  caregiver.   If your caregiver has given you a follow-up appointment, it is very important to keep that appointment. Not keeping the appointment could result in a chronic or permanent injury, pain, and disability. If there is any problem keeping the appointment, you must call back to this facility for assistance.  SEEK IMMEDIATE MEDICAL CARE IF:   You experience loss of control of bowel or bladder.   You have increasing weakness in the trunk, buttocks, or legs.   There is numbness in any areas from the hip down to the toes.   You have difficulty walking or keeping your balance.   You have any of the above, with fever or forceful vomiting.  Document Released: 10/15/2001 Document Revised: 10/10/2011 Document Reviewed: 06/03/2008 [facility] Patient Information 2012 [place],  [place]. RESOURCE GUIDE  Dental Problems  Patients with Medicaid: [facility] [phone] [address]                                           [id number] [employer] Phone:  [phone]                                                  Phone:  [phone]  If unable to pay or uninsured, contact:  [facility] or [facility]. to become qualified for the adult dental clinic.  Chronic Pain Problems Contact [facility]  [phone] Patients need to be referred by their primary care doctor.  Insufficient Money for Medicine Contact [employer]:  call "211" or [facility] [phone].  No Primary Care Doctor Call [facility]  7798085318 Other agencies that provide inexpensive medical care    [facility]  [phone]    [facility]  [phone]    [facility]  [phone]    [facility]  [phone]    [facility]  [phone]    [facility]  [phone]  Psychological Services [facility]  [phone] [facility]  [phone] [facility]   3406252625 (emergency services [phone])  Substance Abuse Resources [facility]  [phone] [facility] [phone] [facility] [phone] Floydene Flock [phone] Residential & Outpatient Substance Abuse Program  [phone]  Abuse/Neglect [facility] Child Abuse Hotline 6514774675 [facility] Child Abuse Hotline [phone] (After Hours)  Emergency Shelter [facility] [phone]  Maternity Homes [facility] at the [place] [facility] [place] [phone] [facility] Alert Services [phone]  MRSA Hotline #:   218 328 6197    [facility] Resources  [facility]     [employer]                          [facility] Dept. [address] [place]                       [address]      [address]  [address]  Sela Hua Phone:  [phone]                                   Phone:  [phone]                 Phone:  [place] Phone:  [place] [phone] (After Hours)

## 2012-02-10 NOTE — ED Provider Notes (Signed)
History   This chart was scribed for Felisa Bonier, MD by Sofie Rower. The patient was seen in room MH03/MH03 and the patient's care was started at 3:46 PM     CSN: [id number]  Arrival date & time 02/10/12  1459   First MD Initiated Contact with Patient 02/10/12 1521      Chief Complaint  Patient presents with  . Back Pain    (Consider location/radiation/quality/duration/timing/severity/associated sxs/prior treatment) HPI  [name] is a 51 y.o. female who presents to the Emergency Department complaining of moderate, episodic right sided back pain onset today with associated symptoms of radiating leg pain. The pt states she "is having right sided back pain which radiates down her right leg, towards the back side of her kneecap." Modifying factors include certain movements, lying down on the left side, which intensifies the pain. Pt has a hx of back pain (every 3-4 months) for the past year.  Pt denies any injury associated with the back pain, x-rays done on her back, chiropractic problems, bladder or bowel incontinence, numbness, difficulty standing or walking, dysuria,   Pt does not have a PCP.    Past Medical History  Diagnosis Date  . Overweight   . 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 Hypertension   . Hx of hysterectomy   . Sciatica     Past Surgical History  Procedure Date  . Abdominal hysterectomy     Family History  Problem Relation Age of Onset  . Coronary artery disease    . Cerebral aneurysm Mother 45    ruptured  . Heart attack Father 55  . Hypertension Brother     severe  . Hypertension Mother     severe    History  Substance Use Topics  . Smoking status: Never Smoker   . Smokeless tobacco: Not on file  . Alcohol Use: Yes    OB History    Grav Para Term Preterm Abortions TAB SAB Ect Mult Living                  Review of Systems  All other systems reviewed and are negative.    10 Systems reviewed  and all are negative for acute change except as noted in the HPI.    Allergies  Latex  Home Medications   Current Outpatient Rx  Name Route Sig Dispense Refill  . AMLODIPINE BESYLATE 10 MG PO TABS Oral Take 1 tablet (10 mg total) by mouth daily. 30 tablet 2  . HYDROCHLOROTHIAZIDE 25 MG PO TABS Oral Take 1 tablet (25 mg total) by mouth daily. 30 tablet 2  . LISINOPRIL 40 MG PO TABS Oral Take 1 tablet (40 mg total) by mouth daily. 30 tablet 2  . METOPROLOL TARTRATE 50 MG PO TABS Oral Take 1 tablet (50 mg total) by mouth 3 (three) times daily. 90 tablet 2  . METOPROLOL SUCCINATE ER 50 MG PO TB24 Oral Take 50 mg by mouth daily.      Marland Kitchen POTASSIUM CHLORIDE 10 MEQ PO TBCR  2 tabs po bid     . SPIRONOLACTONE 50 MG PO TABS Oral Take 1 tablet (50 mg total) by mouth daily. 30 tablet 2  . ZOLPIDEM TARTRATE 10 MG PO TABS Oral Take 10 mg by mouth at bedtime as needed.        BP 145/99  Pulse 79  Temp(Src) 98.4 F (36.9 C) (Oral)  Resp 16  Ht  5\' 7"  (1.702 m)  Wt 211 lb (95.709 kg)  BMI 33.05 kg/m2  SpO2 98%  Physical Exam  Nursing note and vitals reviewed. Constitutional: She is oriented to person, place, and time. She appears well-developed and well-nourished. No distress.  HENT:  Head: Normocephalic and atraumatic.  Right Ear: External ear normal.  Left Ear: External ear normal.  Nose: Nose normal.  Eyes: EOM are normal. Pupils are equal, round, and reactive to light.  Neck: Normal range of motion. Neck supple.  Cardiovascular: Normal rate, regular rhythm and normal heart sounds.  Exam reveals no gallop and no friction rub.   No murmur heard. Pulmonary/Chest: Effort normal and breath sounds normal. No respiratory distress. She has no wheezes. She has no rales.  Abdominal: Soft. Bowel sounds are normal. She exhibits no distension and no mass. There is no tenderness. There is no rebound and no guarding.  Musculoskeletal: Normal range of motion. She exhibits no edema and no tenderness.        Lumbar back: She exhibits tenderness, pain and spasm. She exhibits normal range of motion, no bony tenderness, no swelling, no edema and no deformity.       Right perispinal lumbar muscle spasms. Positive straight leg raise test provoking pain on right side.   Neurological: She is alert and oriented to person, place, and time. She has normal strength and normal reflexes. She exhibits normal muscle tone.       Light touch sensation is persevered between the first two toes.   Skin: Skin is warm and dry. No rash noted. She is not diaphoretic. No erythema.  Psychiatric: She has a normal mood and affect. Her behavior is normal.    ED Course  Procedures (including critical care time)  DIAGNOSTIC STUDIES: Oxygen Saturation is 98% on room air, normal by my interpretation.    COORDINATION OF CARE:    Dg Lumbar Spine Complete  02/10/2012  *RADIOLOGY REPORT*  Clinical Data: Back pain  LUMBAR SPINE - COMPLETE 4+ VIEW  Comparison: None.  Findings: Normal alignment.  No compression fracture, wedge shaped deformity or focal kyphosis.  Very minor endplate degenerative changes.  Preserved vertebral body heights and disc spaces.  No pars defects.  Pedicles appear intact.  Normal SI joints.  IMPRESSION: No acute finding.  Original Report Authenticated By: Judie Petit. Ruel Favors, M.D.      No diagnosis found.   3:47PM- EDP at bedside discusses treatment plan.  MDM  The patient has symptoms and physical exam findings compatible with right lumbar muscular strain, muscle spasm, and a component of right-sided sciatica with a straight leg raise test positive. She has no neurologic deficits distal to the location of pain in her back, and no midline tenderness at the vertebral prominences. She has had no recent injury that she can attribute the resumption of pain 2. She has had this pain in the past successfully treated with anti-inflammatories, narcotic pain medication, and muscle relaxants. I did obtain an x-ray  series lumbar spine and she states that she had never been x-rayed for this problem before and has had it 2 times previous to this time. There are no acute significant findings of the bony spine on x-ray. The patient is feeling better after analgesia, muscle relaxant, and steroids given in the ED. I will discharge her home at this time. I do not have concern for spinal cord compression.      I personally performed the services described in this documentation, which was scribed in my  presence. The recorded information has been reviewed and considered.   Felisa Bonier, MD 02/10/12 [phone]

## 2012-02-10 NOTE — ED Notes (Signed)
Secondary Assessment-  Pt reports onset of right lower back pain radiating to right leg.  Pain is described as severe and intermittent.  Denies injury or urinary symptoms.

## 2012-02-10 NOTE — ED Notes (Signed)
C/o pain to right buttock, hip, leg x 2 days-reports hx of sciatica

## 2012-03-06 ENCOUNTER — Ambulatory Visit: Payer: Self-pay | Admitting: Cardiology

## 2012-03-24 ENCOUNTER — Encounter: Payer: Self-pay | Admitting: Cardiology

## 2012-04-28 ENCOUNTER — Ambulatory Visit: Payer: Managed Care, Other (non HMO) | Admitting: Cardiology

## 2012-07-01 ENCOUNTER — Emergency Department (INDEPENDENT_AMBULATORY_CARE_PROVIDER_SITE_OTHER)
Admission: EM | Admit: 2012-07-01 | Discharge: 2012-07-01 | Disposition: A | Discharge status: Home / Self Care | Payer: Managed Care, Other (non HMO) | Source: Home / Self Care | Attending: Family Medicine | Admitting: Family Medicine

## 2012-07-01 ENCOUNTER — Encounter (HOSPITAL_COMMUNITY): Payer: Self-pay | Admitting: *Deleted

## 2012-07-01 DIAGNOSIS — J020 Streptococcal pharyngitis: Secondary | ICD-10-CM

## 2012-07-01 DIAGNOSIS — H1033 Unspecified acute conjunctivitis, bilateral: Secondary | ICD-10-CM

## 2012-07-01 DIAGNOSIS — H1030 Unspecified acute conjunctivitis, unspecified eye: Secondary | ICD-10-CM

## 2012-07-01 MED ORDER — AMOXICILLIN 500 MG PO CAPS: 500.0000 mg | ORAL_CAPSULE | Freq: Three times a day (TID) | ORAL | Status: AC

## 2012-07-01 MED ORDER — MOXIFLOXACIN HCL 0.5 % OP SOLN: 1.0000 [drp] | Freq: Three times a day (TID) | OPHTHALMIC | Status: AC

## 2012-07-01 NOTE — ED Notes (Signed)
Pt reports sore throat and eye irritation for the the past two days  - has been exposed to others with similar symptoms at work

## 2012-07-01 NOTE — ED Provider Notes (Signed)
History     CSN: [id number]  Arrival date & time 07/01/12  1550   First MD Initiated Contact with Patient 07/01/12 1555      Chief Complaint  Patient presents with  . Sore Throat  . Conjunctivitis    (Consider location/radiation/quality/duration/timing/severity/associated sxs/prior treatment) Patient is a 51 y.o. female presenting with pharyngitis and conjunctivitis. The history is provided by the patient and a relative.  Sore Throat This is a new problem. The current episode started yesterday. The problem has been gradually worsening. Associated symptoms comments: Eye redness and drainage.. The symptoms are aggravated by swallowing.  Conjunctivitis  Associated symptoms include congestion, rhinorrhea, sore throat, eye discharge and eye redness.    Past Medical History  Diagnosis Date  . Overweight   . 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 Hypertension   . Hx of hysterectomy   . Sciatica     Past Surgical History  Procedure Date  . Abdominal hysterectomy     Family History  Problem Relation Age of Onset  . Coronary artery disease    . Cerebral aneurysm Mother 45    ruptured  . Heart attack Father 13  . Hypertension Brother     severe  . Hypertension Mother     severe    History  Substance Use Topics  . Smoking status: Never Smoker   . Smokeless tobacco: Not on file  . Alcohol Use: Yes     social    OB History    Grav Para Term Preterm Abortions TAB SAB Ect Mult Living                  Review of Systems  Constitutional: Negative.   HENT: Positive for congestion, sore throat, rhinorrhea and postnasal drip.   Eyes: Positive for discharge and redness.    Allergies  Latex  Home Medications   Current Outpatient Rx  Name Route Sig Dispense Refill  . AMLODIPINE BESYLATE 10 MG PO TABS Oral Take 1 tablet (10 mg total) by mouth daily. 30 tablet 2  . HYDROCHLOROTHIAZIDE 25 MG PO TABS Oral Take 1 tablet (25 mg total) by  mouth daily. 30 tablet 2  . LISINOPRIL 40 MG PO TABS Oral Take 1 tablet (40 mg total) by mouth daily. 30 tablet 2  . METOPROLOL TARTRATE 50 MG PO TABS Oral Take 1 tablet (50 mg total) by mouth 3 (three) times daily. 90 tablet 2  . POTASSIUM CHLORIDE 10 MEQ PO TBCR  2 tabs po bid     . SPIRONOLACTONE 50 MG PO TABS Oral Take 1 tablet (50 mg total) by mouth daily. 30 tablet 2  . METOPROLOL SUCCINATE ER 50 MG PO TB24 Oral Take 50 mg by mouth daily.        BP 136/87  Pulse 88  Temp 98.3 F (36.8 C) (Oral)  Resp 20  SpO2 97%  Physical Exam  Nursing note and vitals reviewed. Constitutional: She is oriented to person, place, and time. She appears well-developed and well-nourished.  HENT:  Head: Normocephalic.  Right Ear: External ear normal.  Left Ear: External ear normal.  Mouth/Throat: Oropharyngeal exudate present.  Eyes: EOM are normal. Pupils are equal, round, and reactive to light. Right eye exhibits discharge. Left eye exhibits discharge. Right conjunctiva is injected. Left conjunctiva is injected. No scleral icterus.  Neck: Normal range of motion. Neck supple.  Cardiovascular: Normal rate and regular rhythm.   Pulmonary/Chest: Breath sounds normal.  Lymphadenopathy:    She has cervical adenopathy.  Neurological: She is alert and oriented to person, place, and time.  Skin: Skin is warm and dry.    ED Course  Procedures (including critical care time)  Labs Reviewed - No data to display No results found.   1. Conjunctivitis, acute, bilateral   2. Strep sore throat       MDM          Linna Hoff, MD 07/01/12 [phone]

## 2013-01-22 ENCOUNTER — Ambulatory Visit (INDEPENDENT_AMBULATORY_CARE_PROVIDER_SITE_OTHER): Payer: Managed Care, Other (non HMO) | Admitting: Cardiology

## 2013-01-22 ENCOUNTER — Encounter: Payer: Self-pay | Admitting: Cardiology

## 2013-01-22 VITALS — BP 198/112 | HR 68 | Ht 67.0 in | Wt 213.0 lb

## 2013-01-22 DIAGNOSIS — R079 Chest pain, unspecified: Secondary | ICD-10-CM

## 2013-01-22 DIAGNOSIS — E663 Overweight: Secondary | ICD-10-CM

## 2013-01-22 DIAGNOSIS — I10 Essential (primary) hypertension: Secondary | ICD-10-CM

## 2013-01-22 DIAGNOSIS — I429 Cardiomyopathy, unspecified: Secondary | ICD-10-CM

## 2013-01-22 LAB — BASIC METABOLIC PANEL
BUN: 17 mg/dL (ref 6–23)
CO2: 25 mEq/L (ref 19–32)
Chloride: 103 mEq/L (ref 96–112)
Creatinine, Ser: 1 mg/dL (ref 0.4–1.2)

## 2013-01-22 LAB — CBC
HCT: 36.9 % (ref 36.0–46.0)
MCHC: 33.8 g/dL (ref 30.0–36.0)
MCV: 88.5 fl (ref 78.0–100.0)
RDW: 14.6 % (ref 11.5–14.6)
WBC: 5.2 10*3/uL (ref 4.5–10.5)

## 2013-01-22 LAB — TSH: TSH: 1.25 u[IU]/mL (ref 0.35–5.50)

## 2013-01-22 MED ORDER — CLONIDINE HCL 0.1 MG PO TABS
0.1000 mg | ORAL_TABLET | Freq: Once | ORAL | Status: AC
  Administered 2013-01-22: 0.1 mg via ORAL

## 2013-01-22 MED ORDER — SPIRONOLACTONE 50 MG PO TABS: 50.0000 mg | ORAL_TABLET | Freq: Every day | ORAL | Status: DC

## 2013-01-22 MED ORDER — HYDROCHLOROTHIAZIDE 25 MG PO TABS: 25.0000 mg | ORAL_TABLET | Freq: Every day | ORAL | Status: DC

## 2013-01-22 MED ORDER — LISINOPRIL 40 MG PO TABS: 40.0000 mg | ORAL_TABLET | Freq: Every day | ORAL | Status: DC

## 2013-01-22 MED ORDER — METOPROLOL TARTRATE 100 MG PO TABS: 100.0000 mg | ORAL_TABLET | Freq: Three times a day (TID) | ORAL | Status: DC

## 2013-01-22 MED ORDER — CLONIDINE HCL 0.1 MG PO TABS: 0.1000 mg | ORAL_TABLET | Freq: Two times a day (BID) | ORAL | Status: DC

## 2013-01-22 MED ORDER — CLONIDINE HCL 0.1 MG PO TABS
0.1000 mg | ORAL_TABLET | Freq: Every day | ORAL | Status: DC
  Administered 2013-01-22: 0.1 mg via ORAL

## 2013-01-22 NOTE — Progress Notes (Signed)
HPI The patient has not been back in the clinic for awhile because of transportation issues.  She comes in today and her blood pressure is significantly elevated. She says she's feeling well. She's taking her medicines. She hasn't seen her primary doctor and doesn't really know what her blood pressure has been running.  The patient denies any new symptoms such as chest discomfort, neck or arm discomfort. There has been no new shortness of breath, PND or orthopnea. There have been no reported palpitations, presyncope or syncope.   Allergies  Allergen Reactions  . Latex Itching    Current Outpatient Prescriptions  Medication Sig Dispense Refill  . hydrochlorothiazide (HYDRODIURIL) 25 MG tablet Take 1 tablet (25 mg total) by mouth daily.  30 tablet  2  . lisinopril (PRINIVIL,ZESTRIL) 40 MG tablet Take 1 tablet (40 mg total) by mouth daily.  30 tablet  2  . metoprolol (LOPRESSOR) 100 MG tablet Take 100 mg by mouth 3 (three) times daily.      . potassium chloride (KLOR-CON) 10 MEQ CR tablet 2 tabs po bid       . spironolactone (ALDACTONE) 50 MG tablet Take 1 tablet (50 mg total) by mouth daily.  30 tablet  2   No current facility-administered medications for this visit.    Past Medical History  Diagnosis Date  . Overweight   . 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 Hypertension   . Hx of hysterectomy   . Sciatica     Past Surgical History  Procedure Laterality Date  . Abdominal hysterectomy      ROS:  As stated in the HPI and negative for all other systems.  PHYSICAL EXAM BP 210/140  Pulse 68  Ht 5\' 7"  (1.702 m)  Wt 213 lb (96.616 kg)  BMI 33.35 kg/m2 GENERAL:  Well appearing HEENT:  Pupils equal round and reactive, fundi not visualized, oral mucosa unremarkable NECK:  No jugular venous distention, waveform within normal limits, carotid upstroke brisk and symmetric, no bruits, no thyromegaly LYMPHATICS:  No cervical, inguinal  adenopathy LUNGS:  Clear to auscultation bilaterally BACK:  No CVA tenderness CHEST:  Unremarkable HEART:  PMI not displaced or sustained,S1 and S2 within normal limits, no S3, no S4, no clicks, no rubs, no murmurs ABD:  Flat, positive bowel sounds normal in frequency in pitch, no bruits, no rebound, no guarding, no midline pulsatile mass, no hepatomegaly, no splenomegaly EXT:  2 plus pulses throughout, no edema, no cyanosis no clubbing SKIN:  No rashes no nodules NEURO:  Cranial nerves II through XII grossly intact, motor grossly intact throughout PSYCH:  Cognitively intact, oriented to person place and time  EKG:  Sinus rhythm, rate 69, axis within normal limits, intervals within normal limits, no acute ST-T wave changes. LVH with repolarization changes.   01/22/2013  ASSESSMENT AND PLAN  HTN:  Her blood pressure is poorly controlled.  We kept her in the office for an extended period of time while we brought her blood pressure down. I'm going to send her home with clonidine 0.1 mg twice a day. She's going to keep a blood pressure diary. She'll come back in no longer than one month to further evaluate.  OVERWEIGHT:  The patient understands the need to lose weight with diet and exercise. We have discussed specific strategies for this.   (Greater than 40 minutes reviewing all data with greater than 50% face to face with the patient).

## 2013-01-22 NOTE — Patient Instructions (Addendum)
Please start Clonidine 0.1 mg one tablet twice a day. Continue all other medications as listed.  Please obtain a blood pressure monitor and take your blood pressure twice a day.  Please keep a record of this and bring it to your next office visit.  Follow up in 1 month with Dr Antoine Poche.

## 2013-02-01 ENCOUNTER — Telehealth: Payer: Self-pay | Admitting: Cardiology

## 2013-02-01 DIAGNOSIS — E876 Hypokalemia: Secondary | ICD-10-CM

## 2013-02-01 NOTE — Telephone Encounter (Signed)
Spoke to patient lab results given.Stated kdur was discontinued 12/13.Advised to eat foods high in potassium.B/P appointment scheduled 02/05/13.Message sent to Dr.Hochrein and his nurse.

## 2013-02-01 NOTE — Telephone Encounter (Signed)
New Problem:    Patient called in returning Dr. Jenene Slicker call regarding her recent lab results.  Please call back.

## 2013-02-03 NOTE — Telephone Encounter (Signed)
Pt scheduled for 4/4 for BP check  Needs BMP as well.

## 2013-02-05 ENCOUNTER — Other Ambulatory Visit: Payer: Managed Care, Other (non HMO)

## 2013-03-01 ENCOUNTER — Ambulatory Visit: Payer: Managed Care, Other (non HMO) | Admitting: Cardiology

## 2013-03-26 ENCOUNTER — Encounter: Payer: Self-pay | Admitting: *Deleted

## 2013-04-16 ENCOUNTER — Other Ambulatory Visit: Payer: Managed Care, Other (non HMO)

## 2013-04-19 ENCOUNTER — Other Ambulatory Visit (INDEPENDENT_AMBULATORY_CARE_PROVIDER_SITE_OTHER): Payer: Managed Care, Other (non HMO)

## 2013-04-19 DIAGNOSIS — E876 Hypokalemia: Secondary | ICD-10-CM

## 2013-04-19 LAB — BASIC METABOLIC PANEL WITH GFR
BUN: 14 mg/dL (ref 6–23)
CO2: 25 meq/L (ref 19–32)
Calcium: 9.2 mg/dL (ref 8.4–10.5)
Chloride: 106 meq/L (ref 96–112)
Creatinine, Ser: 1.1 mg/dL (ref 0.4–1.2)
GFR: 71.5 mL/min
Glucose, Bld: 88 mg/dL (ref 70–99)
Potassium: 3.7 meq/L (ref 3.5–5.1)
Sodium: 139 meq/L (ref 135–145)

## 2013-07-09 ENCOUNTER — Ambulatory Visit: Payer: Managed Care, Other (non HMO) | Admitting: Internal Medicine

## 2013-09-02 ENCOUNTER — Ambulatory Visit: Payer: Managed Care, Other (non HMO) | Admitting: Internal Medicine

## 2013-09-02 DIAGNOSIS — Z0289 Encounter for other administrative examinations: Secondary | ICD-10-CM

## 2013-10-27 ENCOUNTER — Encounter (HOSPITAL_COMMUNITY): Payer: Self-pay | Admitting: Emergency Medicine

## 2013-10-27 ENCOUNTER — Emergency Department (HOSPITAL_COMMUNITY)
Admission: EM | Admit: 2013-10-27 | Discharge: 2013-10-27 | Disposition: A | Discharge status: Home / Self Care | Payer: Worker's Compensation | Attending: Emergency Medicine | Admitting: Emergency Medicine

## 2013-10-27 DIAGNOSIS — Y990 Civilian activity done for income or pay: Secondary | ICD-10-CM | POA: Insufficient documentation

## 2013-10-27 DIAGNOSIS — Y9289 Other specified places as the place of occurrence of the external cause: Secondary | ICD-10-CM | POA: Insufficient documentation

## 2013-10-27 DIAGNOSIS — E663 Overweight: Secondary | ICD-10-CM | POA: Insufficient documentation

## 2013-10-27 DIAGNOSIS — I10 Essential (primary) hypertension: Secondary | ICD-10-CM | POA: Insufficient documentation

## 2013-10-27 DIAGNOSIS — Z791 Long term (current) use of non-steroidal anti-inflammatories (NSAID): Secondary | ICD-10-CM | POA: Insufficient documentation

## 2013-10-27 DIAGNOSIS — Y9389 Activity, other specified: Secondary | ICD-10-CM | POA: Insufficient documentation

## 2013-10-27 DIAGNOSIS — IMO0002 Reserved for concepts with insufficient information to code with codable children: Secondary | ICD-10-CM | POA: Insufficient documentation

## 2013-10-27 DIAGNOSIS — Z79899 Other long term (current) drug therapy: Secondary | ICD-10-CM | POA: Insufficient documentation

## 2013-10-27 DIAGNOSIS — X500XXA Overexertion from strenuous movement or load, initial encounter: Secondary | ICD-10-CM | POA: Insufficient documentation

## 2013-10-27 DIAGNOSIS — M5431 Sciatica, right side: Secondary | ICD-10-CM

## 2013-10-27 DIAGNOSIS — M5430 Sciatica, unspecified side: Secondary | ICD-10-CM | POA: Insufficient documentation

## 2013-10-27 DIAGNOSIS — Z91040 Latex allergy status: Secondary | ICD-10-CM | POA: Insufficient documentation

## 2013-10-27 MED ORDER — NAPROXEN 500 MG PO TABS: 500.0000 mg | ORAL_TABLET | Freq: Two times a day (BID) | ORAL | Status: DC | PRN

## 2013-10-27 MED ORDER — HYDROMORPHONE HCL PF 1 MG/ML IJ SOLN
1.0000 mg | Freq: Once | INTRAMUSCULAR | Status: AC
  Administered 2013-10-27: 1 mg via INTRAMUSCULAR
  Filled 2013-10-27: qty 1

## 2013-10-27 MED ORDER — DIAZEPAM 5 MG PO TABS: 5.0000 mg | ORAL_TABLET | Freq: Three times a day (TID) | ORAL | Status: DC | PRN

## 2013-10-27 MED ORDER — HYDROCODONE-ACETAMINOPHEN 5-325 MG PO TABS: 1.0000 | ORAL_TABLET | Freq: Four times a day (QID) | ORAL | Status: DC | PRN

## 2013-10-27 NOTE — ED Notes (Signed)
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  States she has trouble with her siatica.  Denies Numbness.

## 2013-10-27 NOTE — ED Provider Notes (Signed)
CSN: [id number]     Arrival date & time 10/27/13  2015 History  This chart was scribed for non-physician practitioner Dierdre Forth, PA-C, working with Shelda Jakes, MD by Leone Payor, ED Scribe. This patient was seen in room TR06C/TR06C and the patient's care was started at 2015.     Chief Complaint  Patient presents with  . Back Pain    The history is provided by the patient and medical records. No language interpreter was used.    HPI Comments: [name] is a 52 y.o. Female with past medical history of sciatica who presents to the Emergency Department complaining of 1 day of gradual onset, constant, gradually worsening back pain that radiates down the right leg. 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 She denies any falls. She has taken tylenol with only mild relief. She denies change in bowel or bladder function, lower extremity weakness, gait trouble, lower extremity numbness. She denies IV drug use, history of CA. She does not take blood thinners. She has a Hx of right sided sciatic pain which has not flared in > 1 year, but pt reports pain today feels the same as previous sciatic episodes.    Past Medical History  Diagnosis Date  . Overweight(278.02)   . 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 Hypertension   . Sciatica    Past Surgical History  Procedure Laterality Date  . Abdominal hysterectomy     Family History  Problem Relation Age of Onset  . Coronary artery disease    . Cerebral aneurysm Mother 45    ruptured  . Heart attack Father 32  . Hypertension Brother     severe  . Hypertension Mother     severe   History  Substance Use Topics  . Smoking status: Never Smoker   . Smokeless tobacco: Not on file  . Alcohol Use: Yes     Comment: social   OB History   Grav Para Term Preterm Abortions TAB SAB Ect Mult  Living                 Review of Systems  Constitutional: Negative for fever and fatigue.  Respiratory: Negative for chest tightness and shortness of breath.   Cardiovascular: Negative for chest pain.  Gastrointestinal: Negative for nausea, vomiting, abdominal pain and diarrhea.  Genitourinary: Negative for dysuria, urgency, frequency and hematuria.  Musculoskeletal: Positive for back pain. Negative for gait problem, joint swelling, neck pain and neck stiffness.  Skin: Negative for rash.  Neurological: Negative for weakness, light-headedness, numbness and headaches.  All other systems reviewed and are negative.    Allergies  Latex  Home Medications   Current Outpatient Rx  Name  Route  Sig  Dispense  Refill  . cloNIDine (CATAPRES) 0.1 MG tablet   Oral   Take 1 tablet (0.1 mg total) by mouth 2 (two) times daily.   60 tablet   11   . hydrochlorothiazide (HYDRODIURIL) 25 MG tablet   Oral   Take 1 tablet (25 mg total) by mouth daily.   30 tablet   11   . lisinopril (PRINIVIL,ZESTRIL) 40 MG tablet   Oral   Take 1 tablet (40 mg total) by mouth daily.   30 tablet   11   . metoprolol (LOPRESSOR) 100 MG tablet  Oral   Take 1 tablet (100 mg total) by mouth 3 (three) times daily.   90 tablet   11   . potassium chloride (KLOR-CON) 10 MEQ CR tablet   Oral   Take 20 mEq by mouth 2 (two) times daily.          Marland Kitchen spironolactone (ALDACTONE) 50 MG tablet   Oral   Take 1 tablet (50 mg total) by mouth daily.   30 tablet   11   . diazepam (VALIUM) 5 MG tablet   Oral   Take 1 tablet (5 mg total) by mouth every 8 (eight) hours as needed for muscle spasms (Take 1 tablet every 8 hours as needed for muscle spasms.).   20 tablet   0   . HYDROcodone-acetaminophen (NORCO/VICODIN) 5-325 MG per tablet   Oral   Take 1 tablet by mouth every 6 (six) hours as needed for severe pain (Take 1 - 2 tablets every 4 - 6 hours.).   6 tablet   0   . naproxen (NAPROSYN) 500 MG tablet    Oral   Take 1 tablet (500 mg total) by mouth 2 (two) times daily as needed.   30 tablet   0    BP 177/106  Pulse 90  Temp(Src) 98.6 F (37 C) (Oral)  Resp 20  SpO2 94% Physical Exam  Nursing note and vitals reviewed. Constitutional: She is oriented to person, place, and time. She appears well-developed and well-nourished. No distress.  HENT:  Head: Normocephalic and atraumatic.  Mouth/Throat: Oropharynx is clear and moist. No oropharyngeal exudate.  Eyes: Conjunctivae are normal.  Neck: Normal range of motion. Neck supple.  Full ROM without pain  Cardiovascular: Normal rate, regular rhythm, normal heart sounds and intact distal pulses.   No murmur heard. Pulmonary/Chest: Effort normal and breath sounds normal. No respiratory distress. She has no wheezes. She has no rales.  Abdominal: Soft. She exhibits no distension. There is no tenderness.  Musculoskeletal:  Mildly decreased range of motion of the T-spine and L-spine 2/2 pain No tenderness to palpation of the spinous processes of the T-spine or L-spine Mild tenderness to palpation of the right paraspinous muscles of the L-spine Reproducible symptoms with palpation of the right buttock  Lymphadenopathy:    She has no cervical adenopathy.  Neurological: She is alert and oriented to person, place, and time. She has normal reflexes. She exhibits normal muscle tone. Coordination normal.  Speech is clear and goal oriented, follows commands Normal strength in upper and lower extremities bilaterally including dorsiflexion and plantar flexion, strong and equal grip strength Sensation normal to light and sharp touch Moves extremities without ataxia, coordination intact Normal gait Normal balance   Skin: Skin is warm and dry. No rash noted. She is not diaphoretic. No erythema.  Psychiatric: She has a normal mood and affect. Her behavior is normal.    ED Course  Procedures   DIAGNOSTIC STUDIES: Oxygen Saturation is 97% on RA,  adequate by my interpretation.    COORDINATION OF CARE: 9:16 PM Discussed treatment plan with pt at bedside and pt agreed to plan.   Labs Review Labs Reviewed  DRUGS OF ABUSE SCREEN W ALC, ROUTINE URINE   Imaging Review No results found.  EKG Interpretation   None       MDM   1. 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  Normal neurological exam, no evidence of urinary incontinence or retention, pain is consistently reproducible. There is no evidence of AAA or concern for dissection at this time.   Patient can walk but states is painful.  No loss of bowel or bladder control.  No concern for cauda equina.  No fever, night sweats, weight loss, h/o cancer, IVDU.    This is a workers comp claim and pt will need a drug screen.  Patient noted to be hypertensive in the emergency department.  No signs of hypertensive urgency.  Pt reports she has not taken any of her HTN medications today.  Discussed with patient the need for close follow-up and management by their primary care physician.   10:51 PM Patient's blood pressure has decreased after adequate pain control.  It is still elevated, I believe this is secondary to not having her hypertension medications today.  Recommend that she go home and take her medications as directed.  BP 177/106  Pulse 90  Temp(Src) 98.6 F (37 C) (Oral)  Resp 20  SpO2 94%  Pain treated here in the department with adequate improvement. RICE protocol and pain medicine indicated and discussed with patient. I have also discussed reasons to return immediately to the ER.  Patient expresses understanding and agrees with plan.  It has been determined that no acute conditions requiring further emergency intervention are present at this time. The patient/guardian have been advised of the diagnosis and plan. We have discussed signs and symptoms that warrant  return to the ED, such as changes or worsening in symptoms.  Patient/guardian has voiced understanding and agreed to follow-up with the PCP or specialist.  I personally performed the services described in this documentation, which was scribed in my presence. The recorded information has been reviewed and is accurate.     Dahlia Client Dov Dill, PA-C 10/27/13 2255

## 2013-10-27 NOTE — ED Notes (Addendum)
BP-213/135, pt states she did not take BP med today. Pt reports her BP spikes like this sometimes. Neuro intact. BP will be checked again.

## 2013-11-01 NOTE — ED Provider Notes (Signed)
Medical screening examination/treatment/procedure(s) were performed by non-physician practitioner and as supervising physician I was immediately available for consultation/collaboration.  EKG Interpretation   None         Shelda Jakes, MD 11/01/13 [phone]

## 2014-04-06 ENCOUNTER — Other Ambulatory Visit: Payer: Self-pay

## 2014-04-06 DIAGNOSIS — I10 Essential (primary) hypertension: Secondary | ICD-10-CM

## 2014-04-06 MED ORDER — LISINOPRIL 40 MG PO TABS: 40.0000 mg | ORAL_TABLET | Freq: Every day | ORAL | Status: DC

## 2014-04-06 MED ORDER — CLONIDINE HCL 0.1 MG PO TABS: 0.1000 mg | ORAL_TABLET | Freq: Two times a day (BID) | ORAL | Status: DC

## 2014-04-06 MED ORDER — HYDROCHLOROTHIAZIDE 25 MG PO TABS: 25.0000 mg | ORAL_TABLET | Freq: Every day | ORAL | Status: DC

## 2014-04-06 MED ORDER — SPIRONOLACTONE 50 MG PO TABS: 50.0000 mg | ORAL_TABLET | Freq: Every day | ORAL | Status: DC

## 2014-04-13 ENCOUNTER — Other Ambulatory Visit: Payer: Self-pay

## 2014-04-13 MED ORDER — METOPROLOL TARTRATE 100 MG PO TABS: 100.0000 mg | ORAL_TABLET | Freq: Three times a day (TID) | ORAL | Status: DC

## 2014-06-02 ENCOUNTER — Ambulatory Visit: Payer: Self-pay | Admitting: Cardiology

## 2014-08-05 ENCOUNTER — Inpatient Hospital Stay (HOSPITAL_COMMUNITY)
Admission: EM | Admit: 2014-08-05 | Discharge: 2014-08-09 | DRG: 291 | Disposition: A | Discharge status: Home / Self Care | Payer: Managed Care, Other (non HMO) | Attending: Cardiology | Admitting: Cardiology

## 2014-08-05 ENCOUNTER — Emergency Department (HOSPITAL_COMMUNITY): Payer: Managed Care, Other (non HMO)

## 2014-08-05 ENCOUNTER — Encounter (HOSPITAL_COMMUNITY): Payer: Self-pay | Admitting: Emergency Medicine

## 2014-08-05 DIAGNOSIS — R0602 Shortness of breath: Secondary | ICD-10-CM | POA: Diagnosis present

## 2014-08-05 DIAGNOSIS — I10 Essential (primary) hypertension: Secondary | ICD-10-CM | POA: Diagnosis present

## 2014-08-05 DIAGNOSIS — Z8249 Family history of ischemic heart disease and other diseases of the circulatory system: Secondary | ICD-10-CM

## 2014-08-05 DIAGNOSIS — Z6831 Body mass index (BMI) 31.0-31.9, adult: Secondary | ICD-10-CM

## 2014-08-05 DIAGNOSIS — R400 Somnolence: Secondary | ICD-10-CM | POA: Diagnosis present

## 2014-08-05 DIAGNOSIS — E119 Type 2 diabetes mellitus without complications: Secondary | ICD-10-CM | POA: Diagnosis present

## 2014-08-05 DIAGNOSIS — I5023 Acute on chronic systolic (congestive) heart failure: Secondary | ICD-10-CM | POA: Diagnosis present

## 2014-08-05 DIAGNOSIS — Z0389 Encounter for observation for other suspected diseases and conditions ruled out: Secondary | ICD-10-CM

## 2014-08-05 DIAGNOSIS — I130 Hypertensive heart and chronic kidney disease with heart failure and stage 1 through stage 4 chronic kidney disease, or unspecified chronic kidney disease: Secondary | ICD-10-CM | POA: Diagnosis not present

## 2014-08-05 DIAGNOSIS — IMO0001 Reserved for inherently not codable concepts without codable children: Secondary | ICD-10-CM

## 2014-08-05 DIAGNOSIS — I429 Cardiomyopathy, unspecified: Secondary | ICD-10-CM | POA: Diagnosis present

## 2014-08-05 DIAGNOSIS — Z91199 Patient's noncompliance with other medical treatment and regimen due to unspecified reason: Secondary | ICD-10-CM

## 2014-08-05 DIAGNOSIS — I428 Other cardiomyopathies: Secondary | ICD-10-CM

## 2014-08-05 DIAGNOSIS — Z9114 Patient's other noncompliance with medication regimen: Secondary | ICD-10-CM | POA: Diagnosis present

## 2014-08-05 DIAGNOSIS — Z9119 Patient's noncompliance with other medical treatment and regimen: Secondary | ICD-10-CM

## 2014-08-05 DIAGNOSIS — R079 Chest pain, unspecified: Secondary | ICD-10-CM | POA: Diagnosis present

## 2014-08-05 DIAGNOSIS — I509 Heart failure, unspecified: Secondary | ICD-10-CM

## 2014-08-05 DIAGNOSIS — I5043 Acute on chronic combined systolic (congestive) and diastolic (congestive) heart failure: Secondary | ICD-10-CM | POA: Diagnosis present

## 2014-08-05 DIAGNOSIS — M5489 Other dorsalgia: Secondary | ICD-10-CM | POA: Diagnosis present

## 2014-08-05 DIAGNOSIS — T465X5A Adverse effect of other antihypertensive drugs, initial encounter: Secondary | ICD-10-CM | POA: Diagnosis present

## 2014-08-05 DIAGNOSIS — N289 Disorder of kidney and ureter, unspecified: Secondary | ICD-10-CM

## 2014-08-05 DIAGNOSIS — Z79899 Other long term (current) drug therapy: Secondary | ICD-10-CM

## 2014-08-05 DIAGNOSIS — N178 Other acute kidney failure: Secondary | ICD-10-CM | POA: Diagnosis present

## 2014-08-05 DIAGNOSIS — N189 Chronic kidney disease, unspecified: Secondary | ICD-10-CM | POA: Diagnosis present

## 2014-08-05 DIAGNOSIS — E669 Obesity, unspecified: Secondary | ICD-10-CM | POA: Diagnosis present

## 2014-08-05 DIAGNOSIS — Z7901 Long term (current) use of anticoagulants: Secondary | ICD-10-CM

## 2014-08-05 LAB — BASIC METABOLIC PANEL
Anion gap: 12 (ref 5–15)
BUN: 21 mg/dL (ref 6–23)
CALCIUM: 8.8 mg/dL (ref 8.4–10.5)
CO2: 25 mEq/L (ref 19–32)
Chloride: 105 mEq/L (ref 96–112)
Creatinine, Ser: 1.44 mg/dL — ABNORMAL HIGH (ref 0.50–1.10)
GFR, EST AFRICAN AMERICAN: 48 mL/min — AB (ref >90)
GFR, EST NON AFRICAN AMERICAN: 42 mL/min — AB (ref >90)
GLUCOSE: 131 mg/dL — AB (ref 70–99)
POTASSIUM: 3.6 meq/L — AB (ref 3.7–5.3)
Sodium: 142 mEq/L (ref 137–147)

## 2014-08-05 LAB — CBC
HCT: 36.5 % (ref 36.0–46.0)
HEMOGLOBIN: 12.6 g/dL (ref 12.0–15.0)
MCH: 29.7 pg (ref 26.0–34.0)
MCHC: 34.5 g/dL (ref 30.0–36.0)
MCV: 86.1 fL (ref 78.0–100.0)
Platelets: 262 10*3/uL (ref 150–400)
RBC: 4.24 MIL/uL (ref 3.87–5.11)
RDW: 14 % (ref 11.5–15.5)
WBC: 5.2 10*3/uL (ref 4.0–10.5)

## 2014-08-05 LAB — I-STAT TROPONIN, ED: TROPONIN I, POC: 0.05 ng/mL (ref 0.00–0.08)

## 2014-08-05 LAB — PRO B NATRIURETIC PEPTIDE: Pro B Natriuretic peptide (BNP): 2483 pg/mL — ABNORMAL HIGH (ref 0–125)

## 2014-08-05 NOTE — ED Notes (Signed)
Patient transported to XR. 

## 2014-08-05 NOTE — ED Notes (Addendum)
Pt reports SOB with exertion x 2 weeks. Denies at this time, lungs clear bilaterally. Pt in NAD. PT also reports intermittent upper abdominal "pressure". States "it just comes and goes. Nothing seems to make it come on." Denies at this time. Pt denies any CP. VSS. Pt in NAD. Pt noted to high BP, states "that is normally what it is."

## 2014-08-06 ENCOUNTER — Encounter (HOSPITAL_COMMUNITY): Payer: Self-pay | Admitting: General Practice

## 2014-08-06 DIAGNOSIS — Z8249 Family history of ischemic heart disease and other diseases of the circulatory system: Secondary | ICD-10-CM | POA: Diagnosis not present

## 2014-08-06 DIAGNOSIS — I059 Rheumatic mitral valve disease, unspecified: Secondary | ICD-10-CM

## 2014-08-06 DIAGNOSIS — I509 Heart failure, unspecified: Secondary | ICD-10-CM | POA: Diagnosis present

## 2014-08-06 DIAGNOSIS — N178 Other acute kidney failure: Secondary | ICD-10-CM | POA: Diagnosis present

## 2014-08-06 DIAGNOSIS — N179 Acute kidney failure, unspecified: Secondary | ICD-10-CM

## 2014-08-06 DIAGNOSIS — R400 Somnolence: Secondary | ICD-10-CM | POA: Diagnosis present

## 2014-08-06 DIAGNOSIS — I130 Hypertensive heart and chronic kidney disease with heart failure and stage 1 through stage 4 chronic kidney disease, or unspecified chronic kidney disease: Secondary | ICD-10-CM | POA: Diagnosis present

## 2014-08-06 DIAGNOSIS — N189 Chronic kidney disease, unspecified: Secondary | ICD-10-CM

## 2014-08-06 DIAGNOSIS — Z6831 Body mass index (BMI) 31.0-31.9, adult: Secondary | ICD-10-CM | POA: Diagnosis not present

## 2014-08-06 DIAGNOSIS — Z9114 Patient's other noncompliance with medication regimen: Secondary | ICD-10-CM | POA: Diagnosis present

## 2014-08-06 DIAGNOSIS — Z79899 Other long term (current) drug therapy: Secondary | ICD-10-CM | POA: Diagnosis not present

## 2014-08-06 DIAGNOSIS — I5041 Acute combined systolic (congestive) and diastolic (congestive) heart failure: Secondary | ICD-10-CM

## 2014-08-06 DIAGNOSIS — I5043 Acute on chronic combined systolic (congestive) and diastolic (congestive) heart failure: Secondary | ICD-10-CM | POA: Diagnosis present

## 2014-08-06 DIAGNOSIS — E669 Obesity, unspecified: Secondary | ICD-10-CM | POA: Diagnosis present

## 2014-08-06 DIAGNOSIS — E119 Type 2 diabetes mellitus without complications: Secondary | ICD-10-CM | POA: Diagnosis present

## 2014-08-06 DIAGNOSIS — I429 Cardiomyopathy, unspecified: Secondary | ICD-10-CM | POA: Diagnosis present

## 2014-08-06 DIAGNOSIS — I10 Essential (primary) hypertension: Secondary | ICD-10-CM

## 2014-08-06 DIAGNOSIS — I5023 Acute on chronic systolic (congestive) heart failure: Secondary | ICD-10-CM | POA: Diagnosis present

## 2014-08-06 DIAGNOSIS — T465X5A Adverse effect of other antihypertensive drugs, initial encounter: Secondary | ICD-10-CM | POA: Diagnosis present

## 2014-08-06 DIAGNOSIS — M5489 Other dorsalgia: Secondary | ICD-10-CM | POA: Diagnosis present

## 2014-08-06 DIAGNOSIS — Z7901 Long term (current) use of anticoagulants: Secondary | ICD-10-CM | POA: Diagnosis not present

## 2014-08-06 LAB — LIPID PANEL
Cholesterol: 234 mg/dL — ABNORMAL HIGH (ref 0–200)
HDL: 72 mg/dL (ref >39)
LDL Cholesterol: 148 mg/dL — ABNORMAL HIGH (ref 0–99)
TRIGLYCERIDES: 72 mg/dL (ref <150)
Total CHOL/HDL Ratio: 3.3 RATIO
VLDL: 14 mg/dL (ref 0–40)

## 2014-08-06 LAB — GLUCOSE, CAPILLARY
GLUCOSE-CAPILLARY: 103 mg/dL — AB (ref 70–99)
Glucose-Capillary: 108 mg/dL — ABNORMAL HIGH (ref 70–99)

## 2014-08-06 LAB — HEMOGLOBIN A1C
Hgb A1c MFr Bld: 5.9 % — ABNORMAL HIGH (ref <5.7)
Mean Plasma Glucose: 123 mg/dL — ABNORMAL HIGH (ref <117)

## 2014-08-06 LAB — MAGNESIUM: Magnesium: 2 mg/dL (ref 1.5–2.5)

## 2014-08-06 LAB — TROPONIN I

## 2014-08-06 LAB — TSH: TSH: 1.9 u[IU]/mL (ref 0.350–4.500)

## 2014-08-06 MED ORDER — SODIUM CHLORIDE 0.9 % IJ SOLN: 3.0000 mL | INTRAMUSCULAR | Status: DC | PRN

## 2014-08-06 MED ORDER — SODIUM CHLORIDE 0.9 % IV SOLN: 250.0000 mL | INTRAVENOUS | Status: DC | PRN

## 2014-08-06 MED ORDER — FUROSEMIDE 10 MG/ML IJ SOLN
20.0000 mg | Freq: Two times a day (BID) | INTRAMUSCULAR | Status: AC
  Administered 2014-08-06 (×2): 20 mg via INTRAVENOUS

## 2014-08-06 MED ORDER — SODIUM CHLORIDE 0.9 % IJ SOLN
3.0000 mL | Freq: Two times a day (BID) | INTRAMUSCULAR | Status: DC
  Administered 2014-08-06: 21:00:00 via INTRAVENOUS
  Administered 2014-08-06 – 2014-08-09 (×6): 3 mL via INTRAVENOUS

## 2014-08-06 MED ORDER — METOPROLOL TARTRATE 50 MG PO TABS
50.0000 mg | ORAL_TABLET | Freq: Two times a day (BID) | ORAL | Status: DC
  Administered 2014-08-06 – 2014-08-08 (×7): 50 mg via ORAL
  Filled 2014-08-06 (×9): qty 1

## 2014-08-06 MED ORDER — SPIRONOLACTONE 50 MG PO TABS
50.0000 mg | ORAL_TABLET | Freq: Every day | ORAL | Status: DC
  Administered 2014-08-06 – 2014-08-09 (×4): 50 mg via ORAL
  Filled 2014-08-06 (×4): qty 1

## 2014-08-06 MED ORDER — HEPARIN SODIUM (PORCINE) 5000 UNIT/ML IJ SOLN
5000.0000 [IU] | Freq: Three times a day (TID) | INTRAMUSCULAR | Status: DC
  Administered 2014-08-06 – 2014-08-09 (×11): 5000 [IU] via SUBCUTANEOUS
  Filled 2014-08-06 (×13): qty 1

## 2014-08-06 MED ORDER — ACETAMINOPHEN 325 MG PO TABS: 650.0000 mg | ORAL_TABLET | ORAL | Status: DC | PRN

## 2014-08-06 MED ORDER — ISOSORB DINITRATE-HYDRALAZINE 20-37.5 MG PO TABS
1.0000 | ORAL_TABLET | Freq: Two times a day (BID) | ORAL | Status: DC
  Administered 2014-08-06 – 2014-08-07 (×4): 1 via ORAL
  Filled 2014-08-06 (×6): qty 1

## 2014-08-06 MED ORDER — ISOSORB DINITRATE-HYDRALAZINE 20-37.5 MG PO TABS
1.0000 | ORAL_TABLET | Freq: Once | ORAL | Status: DC
Start: 2014-08-06 — End: 2014-08-06

## 2014-08-06 MED ORDER — ONDANSETRON HCL 4 MG/2ML IJ SOLN: 4.0000 mg | Freq: Four times a day (QID) | INTRAMUSCULAR | Status: DC | PRN

## 2014-08-06 MED ORDER — FUROSEMIDE 10 MG/ML IJ SOLN
INTRAMUSCULAR | Status: AC
  Filled 2014-08-06: qty 4

## 2014-08-06 MED ORDER — ISOSORB DINITRATE-HYDRALAZINE 20-37.5 MG PO TABS
0.5000 | ORAL_TABLET | Freq: Once | ORAL | Status: AC
  Administered 2014-08-06: 0.5 via ORAL
  Filled 2014-08-06: qty 0.5

## 2014-08-06 MED ORDER — LISINOPRIL 40 MG PO TABS
40.0000 mg | ORAL_TABLET | Freq: Every day | ORAL | Status: DC
  Administered 2014-08-06 – 2014-08-09 (×4): 40 mg via ORAL
  Filled 2014-08-06 (×4): qty 1

## 2014-08-06 MED ORDER — FUROSEMIDE 10 MG/ML IJ SOLN
20.0000 mg | Freq: Once | INTRAMUSCULAR | Status: AC
  Administered 2014-08-06: 20 mg via INTRAVENOUS
  Filled 2014-08-06: qty 2

## 2014-08-06 NOTE — Progress Notes (Signed)
Patient transferred from the ED to room 3E17. Oriented patient to room and room equipment including call bell system. Educated patient to the Heart Failure floor and Heart Failure folder given to patient. Patient understood after explaining the contents of the material in folder. Vital signs taken. Blood pressure elevated but was given Bidil in the emergency room. Will retake blood pressure in about an hour and continue to monitor patient to end of shift.

## 2014-08-06 NOTE — Progress Notes (Signed)
DAILY PROGRESS NOTE  Subjective:  No events overnight. Breathing better. Very little recorded UOP overnight. Weight is 201 lb. Troponin negative x 2. BNP elevated on admission to 2,483. Lipid profile this am is not at goal - LDL 148, TC 234. Blood pressure remains uncontrolled.  Objective:  Temp:  [98.1 F (36.7 C)-98.3 F (36.8 C)] 98.2 F (36.8 C) (10/03 0651) Pulse Rate:  [69-94] 69 (10/03 0651) Resp:  [14-19] 18 (10/03 0651) BP: (140-180)/(100-120) 140/100 mmHg (10/03 0951) SpO2:  [96 %-100 %] 99 % (10/03 0651) Weight:  [201 lb 8 oz (91.4 kg)] 201 lb 8 oz (91.4 kg) (10/03 0157) Weight change:   Intake/Output from previous day: 10/02 0701 - 10/03 0700 In: 3 [I.V.:3] Out: -   Intake/Output from this shift: Total I/O In: 240 [P.O.:240] Out: 400 [Urine:400]  Medications: Current Facility-Administered Medications  Medication Dose Route Frequency Provider Last Rate Last Dose  . 0.9 %  sodium chloride infusion  250 mL Intravenous PRN Alwyn Pea, MD      . acetaminophen (TYLENOL) tablet 650 mg  650 mg Oral Q4H PRN Alwyn Pea, MD      . heparin injection 5,000 Units  5,000 Units Subcutaneous 3 times per day Alwyn Pea, MD   5,000 Units at 08/06/14 [phone]  . lisinopril (PRINIVIL,ZESTRIL) tablet 40 mg  40 mg Oral Daily Alwyn Pea, MD   40 mg at 08/06/14 0951  . metoprolol (LOPRESSOR) tablet 50 mg  50 mg Oral BID Alwyn Pea, MD   50 mg at 08/06/14 0951  . ondansetron (ZOFRAN) injection 4 mg  4 mg Intravenous Q6H PRN Alwyn Pea, MD      . sodium chloride 0.9 % injection 3 mL  3 mL Intravenous Q12H Alwyn Pea, MD   3 mL at 08/06/14 0954  . sodium chloride 0.9 % injection 3 mL  3 mL Intravenous PRN Alwyn Pea, MD      . spironolactone (ALDACTONE) tablet 50 mg  50 mg Oral Daily Alwyn Pea, MD   50 mg at 08/06/14 2353    Physical Exam: General appearance: alert, no distress and morbidly obese Lungs: clear to  auscultation bilaterally Heart: regular rate and rhythm, S1, S2 normal and S3 present Abdomen: soft, non-tender; bowel sounds normal; no masses,  no organomegaly Extremities: extremities normal, atraumatic, no cyanosis or edema  Lab Results: Results for orders placed during the hospital encounter of 08/05/14 (from the past 48 hour(s))  CBC     Status: None   Collection Time    08/05/14  8:31 PM      Result Value Ref Range   WBC 5.2  4.0 - 10.5 K/uL   RBC 4.24  3.87 - 5.11 MIL/uL   Hemoglobin 12.6  12.0 - 15.0 g/dL   HCT 36.5  36.0 - 46.0 %   MCV 86.1  78.0 - 100.0 fL   MCH 29.7  26.0 - 34.0 pg   MCHC 34.5  30.0 - 36.0 g/dL   RDW 14.0  11.5 - 15.5 %   Platelets 262  150 - 400 K/uL  BASIC METABOLIC PANEL     Status: Abnormal   Collection Time    08/05/14  8:31 PM      Result Value Ref Range   Sodium 142  137 - 147 mEq/L   Potassium 3.6 (*) 3.7 - 5.3 mEq/L   Chloride 105  96 - 112 mEq/L   CO2 25  19 - 32 mEq/L   Glucose, Bld 131 (*)  70 - 99 mg/dL   BUN 21  6 - 23 mg/dL   Creatinine, Ser 1.44 (*) 0.50 - 1.10 mg/dL   Calcium 8.8  8.4 - 10.5 mg/dL   GFR calc non Af Amer 42 (*) >90 mL/min   GFR calc Af Amer 48 (*) >90 mL/min   Comment: (NOTE)     The eGFR has been calculated using the CKD EPI equation.     This calculation has not been validated in all clinical situations.     eGFR's persistently <90 mL/min signify possible Chronic Kidney     Disease.   Anion gap 12  5 - 15  PRO B NATRIURETIC PEPTIDE     Status: Abnormal   Collection Time    08/05/14  8:31 PM      Result Value Ref Range   Pro B Natriuretic peptide (BNP) 2483.0 (*) 0 - 125 pg/mL  I-STAT TROPOININ, ED     Status: None   Collection Time    08/05/14  8:41 PM      Result Value Ref Range   Troponin i, poc 0.05  0.00 - 0.08 ng/mL   Comment 3            Comment: Due to the release kinetics of cTnI,     a negative result within the first hours     of the onset of symptoms does not rule out     myocardial  infarction with certainty.     If myocardial infarction is still suspected,     repeat the test at appropriate intervals.  MAGNESIUM     Status: None   Collection Time    08/06/14  2:40 AM      Result Value Ref Range   Magnesium 2.0  1.5 - 2.5 mg/dL  TSH     Status: None   Collection Time    08/06/14  2:40 AM      Result Value Ref Range   TSH 1.900  0.350 - 4.500 uIU/mL  TROPONIN I     Status: None   Collection Time    08/06/14  2:40 AM      Result Value Ref Range   Troponin I <0.30  <0.30 ng/mL   Comment:            Due to the release kinetics of cTnI,     a negative result within the first hours     of the onset of symptoms does not rule out     myocardial infarction with certainty.     If myocardial infarction is still suspected,     repeat the test at appropriate intervals.  LIPID PANEL     Status: Abnormal   Collection Time    08/06/14  2:40 AM      Result Value Ref Range   Cholesterol 234 (*) 0 - 200 mg/dL   Triglycerides 72  <150 mg/dL   HDL 72  >39 mg/dL   Total CHOL/HDL Ratio 3.3     VLDL 14  0 - 40 mg/dL   LDL Cholesterol 148 (*) 0 - 99 mg/dL   Comment:            Total Cholesterol/HDL:CHD Risk     Coronary Heart Disease Risk Table                         Men   Women      1/2 Average Risk   3.4  3.3      Average Risk       5.0   4.4      2 X Average Risk   9.6   7.1      3 X Average Risk  23.4   11.0                Use the calculated Patient Ratio     above and the CHD Risk Table     to determine the patient's CHD Risk.                ATP III CLASSIFICATION (LDL):      <100     mg/dL   Optimal      100-129  mg/dL   Near or Above                        Optimal      130-159  mg/dL   Borderline      160-189  mg/dL   High      >190     mg/dL   Very High  TROPONIN I     Status: None   Collection Time    08/06/14  7:13 AM      Result Value Ref Range   Troponin I <0.30  <0.30 ng/mL   Comment:            Due to the release kinetics of cTnI,     a  negative result within the first hours     of the onset of symptoms does not rule out     myocardial infarction with certainty.     If myocardial infarction is still suspected,     repeat the test at appropriate intervals.    Imaging: Dg Chest 2 View  08/05/2014   CLINICAL DATA:  Shortness of breath. Epigastric abdominal in chest pain for 3 weeks.  EXAM: CHEST  2 VIEW  COMPARISON:  01/18/2010  FINDINGS: Cardiac enlargement with normal pulmonary vascularity. No focal airspace disease or consolidation in the lungs. No blunting of costophrenic angles. No pneumothorax. Mediastinal contours appear intact.  IMPRESSION: Cardiac enlargement.  No evidence of active pulmonary disease.   Electronically Signed   By: Lucienne Capers M.D.   On: 08/05/2014 22:24    Assessment:  Principal Problem:   Acute on chronic systolic CHF (congestive heart failure), NYHA class 4 Active Problems:   Obesity (BMI 30-39.9)   Essential hypertension   Nonischemic cardiomyopathy   DYSPNEA   Chest pain   Plan:  1. Acute on chronic systolic congestive heart failure (prior EF 40%) : Suspect due to admitted medical non-compliance.  Diuresing well now. Continue IV diuretics 20 mg BID, possible switch to po lasix tomorrow.  Echo pending today, will review. Check BNP in am tomorrow. 2. Uncontrolled hypertension : Agree with adding bidil 20/37.5 mg BID for additional BP control and CHF treatment. 3. Chest pressure - actually, upper abdominal fullness. Probably related to CHF. Do not suspect ACS. Troponin - x 2 4.   Dispo - hopefully home tomorrow or Monday.  Time Spent Directly with Patient:  15 minutes  Length of Stay:  LOS: 1 day   Pixie Casino, MD, [facility] Attending Cardiologist [facility]  HILTY,Kenneth C 08/06/2014, 10:29 AM

## 2014-08-06 NOTE — H&P (Signed)
Cardiology History and Physical  PCP: No primary provider on file. Cardiologist: Dr. Minus Breeding  History of Present Illness (and review of medical records): [name] is a 53 y.o. female who presents for evaluation of shortness of breath.  She has been seen by Dr. Minus Breeding in cardiology with known poorly controlled hypertension.  She reports shortness of breath for over the past 3 weeks getting progressively worse.  She also now reports intermittent chest pain, across lower mid chest, rated 10/10.  Pain last 5 minutes and usually associated with deep breaths and sometimes with exertion.  She reports also dyspnea on mild exertion performing ADLS and PND.  She denies orthopnea, LE swelling or noticeable weight gain.  She does admit to not being compliant with blood pressure medications.  In particular does not take clonidine because it makes her sleepy.  She came into the ED today given increased dyspnea on exertion and chest pain while getting ready for work this am.  In ED patient had SBP in 200s. Labs revealed torp 0.05, BNP 2483, Cr 1.44, K 3.6.  Previous diagnostic testing for coronary artery disease includes: cardiac catheterization and echocardiogram. Previous history of cardiac disease includes Cardiomyopathy. oronary artery disease risk factors include: hypertension and obesity (BMI >= 30 kg/m2).  Patient denies history of CHF, coronary artery disease, previous M.I. and valvular disease.  Echo 01/2009 - Overall left ventricular systolic function was normal. Left ventricular ejection fraction was estimated to be 55 %. There was no diagnostic evidence of left ventricular regional wall motion abnormalities. Left ventricular wall thickness was mildly increased. Features were consistent with mild diastolic dysfunction.  Cardiac Cath 03/2005 Coronaries: The left main was normal. The LAD was large and wrapped the  apex and was normal. The first diagonal was small and normal. The  second  diagonal was moderate size and normal. The circumflex and the AV groove was  somewhat small but normal. There was a very large ramus intermediate which  was normal. OM1 was small and normal. The right coronary artery was a  dominant vessel though not particularly large. It was normal throughout its  course. There was a PDA which was normal.  Left ventriculogram: The left ventriculogram was obtained in the RAO  projection. The EF was about 30-35% with global hypokinesis.  CONCLUSION: Normal coronaries. Moderate left ventricular dysfunction.  Elevated left ventricular end diastolic pressure and pulmonary capillary  wedge pressure.  Review of Systems Further review of systems was otherwise negative other than stated in HPI.  Patient Active Problem List   Diagnosis Date Noted  . Acute CHF (congestive heart failure) 08/06/2014  . DYSPNEA 07/04/2010  . CHEST PAIN 05/24/2009  . OVERWEIGHT/OBESITY 02/13/2009  . Essential hypertension 02/13/2009  . CARDIOMYOPATHY, SECONDARY 02/13/2009   Past Medical History  Diagnosis Date  . Overweight(278.02)   . 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 Hypertension   . Sciatica   . Diabetes mellitus without complication     Past Surgical History  Procedure Laterality Date  . Abdominal hysterectomy       (Not in a hospital admission) Allergies  Allergen Reactions  . Latex Itching    History  Substance Use Topics  . Smoking status: Never Smoker   . Smokeless tobacco: Not on file  . Alcohol Use: Yes     Comment: social    Family History  Problem Relation Age of Onset  . Coronary artery disease    .  Cerebral aneurysm Mother 88    ruptured  . Heart attack Father 15  . Hypertension Brother     severe  . Hypertension Mother     severe     Objective:  Patient Vitals for the past 8 hrs:  BP Temp Temp src Pulse Resp SpO2  08/06/14 0029 179/119 mmHg - - 69 15 99 %  08/05/14 2342 180/108 mmHg - -  70 14 100 %  08/05/14 2139 177/120 mmHg - - 79 19 99 %  08/05/14 2100 176/111 mmHg - - 74 19 100 %  08/05/14 2018 177/119 mmHg 98.1 F (36.7 C) Oral 84 18 99 %   General appearance: alert, cooperative, appears stated age and no distress Head: Normocephalic, without obvious abnormality, atraumatic Eyes: conjunctivae/corneas clear. PERRL, EOM's intact. Neck: positive JVD Lungs: clear to auscultation bilaterally Chest wall: no tenderness Heart: regular rate and rhythm, S1, S2 normal, no murmur detected Abdomen: soft, non-tender; bowel sounds normal Extremities: 1+ LE edema Pulses: 2+ and symmetric Neurologic: Grossly normal  Results for orders placed during the hospital encounter of 08/05/14 (from the past 48 hour(s))  CBC     Status: None   Collection Time    08/05/14  8:31 PM      Result Value Ref Range   WBC 5.2  4.0 - 10.5 K/uL   RBC 4.24  3.87 - 5.11 MIL/uL   Hemoglobin 12.6  12.0 - 15.0 g/dL   HCT 36.5  36.0 - 46.0 %   MCV 86.1  78.0 - 100.0 fL   MCH 29.7  26.0 - 34.0 pg   MCHC 34.5  30.0 - 36.0 g/dL   RDW 14.0  11.5 - 15.5 %   Platelets 262  150 - 400 K/uL  BASIC METABOLIC PANEL     Status: Abnormal   Collection Time    08/05/14  8:31 PM      Result Value Ref Range   Sodium 142  137 - 147 mEq/L   Potassium 3.6 (*) 3.7 - 5.3 mEq/L   Chloride 105  96 - 112 mEq/L   CO2 25  19 - 32 mEq/L   Glucose, Bld 131 (*) 70 - 99 mg/dL   BUN 21  6 - 23 mg/dL   Creatinine, Ser 1.44 (*) 0.50 - 1.10 mg/dL   Calcium 8.8  8.4 - 10.5 mg/dL   GFR calc non Af Amer 42 (*) >90 mL/min   GFR calc Af Amer 48 (*) >90 mL/min   Comment: (NOTE)     The eGFR has been calculated using the CKD EPI equation.     This calculation has not been validated in all clinical situations.     eGFR's persistently <90 mL/min signify possible Chronic Kidney     Disease.   Anion gap 12  5 - 15  PRO B NATRIURETIC PEPTIDE     Status: Abnormal   Collection Time    08/05/14  8:31 PM      Result Value Ref Range    Pro B Natriuretic peptide (BNP) 2483.0 (*) 0 - 125 pg/mL  I-STAT TROPOININ, ED     Status: None   Collection Time    08/05/14  8:41 PM      Result Value Ref Range   Troponin i, poc 0.05  0.00 - 0.08 ng/mL   Comment 3            Comment: Due to the release kinetics of cTnI,     a negative result  within the first hours     of the onset of symptoms does not rule out     myocardial infarction with certainty.     If myocardial infarction is still suspected,     repeat the test at appropriate intervals.   Dg Chest 2 View  08/05/2014   CLINICAL DATA:  Shortness of breath. Epigastric abdominal in chest pain for 3 weeks.  EXAM: CHEST  2 VIEW  COMPARISON:  01/18/2010  FINDINGS: Cardiac enlargement with normal pulmonary vascularity. No focal airspace disease or consolidation in the lungs. No blunting of costophrenic angles. No pneumothorax. Mediastinal contours appear intact.  IMPRESSION: Cardiac enlargement.  No evidence of active pulmonary disease.   Electronically Signed   By: Lucienne Capers M.D.   On: 08/05/2014 22:24    ECG: 08-05-14 8:07pm sinus rhythm, LVH with secondary repolarization changes, prolonged QT, no significant change from prior ecgs.  Assessment: Acute CHF, likely combined systolic and diastolic  Hypertensive heart disease Acute on chronic kidney disease  Obesity Non-compliance  Plan: 1. Cardiology  Admission  2. Continuous monitoring on Telemetry. 3. Repeat ekg on admit, prn chest pain or arrythmia 4. Trend cardiac biomarkers, check lipids, hgba1c, tsh 5. Gentle IV diuresis with Lasix, monitor strict I/Os, daily weights 6. Blood pressure control, restart BB at lower dose, placed on BID as opposed to TID given poor compliance       Continue ACEi and Aldactone. 7.  Hydralazine IV prn for elevated BPs. 8.  Will likely need addition of Bidil to HF regimen. 9.  TTE in am reassess LV function

## 2014-08-06 NOTE — ED Notes (Signed)
Transporting Patient to new room assignment.

## 2014-08-06 NOTE — ED Provider Notes (Signed)
CSN: [id number]     Arrival date & time 08/05/14  2001 History   First MD Initiated Contact with Patient 08/05/14 2022     Chief Complaint  Patient presents with  . Shortness of Breath     (Consider location/radiation/quality/duration/timing/severity/associated sxs/prior Treatment) Patient is a 53 y.o. female presenting with shortness of breath. The history is provided by the patient.  Shortness of Breath Severity:  Moderate Onset quality:  Gradual Duration:  3 weeks Timing:  Intermittent Progression:  Worsening Chronicity:  New Context: activity   Relieved by:  Rest Worsened by:  Exertion Ineffective treatments:  None tried Associated symptoms: no abdominal pain, no chest pain, no cough, no diaphoresis, no fever, no syncope and no vomiting     Past Medical History  Diagnosis Date  . Overweight(278.02)   . 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 Hypertension   . Sciatica   . Diabetes mellitus without complication    Past Surgical History  Procedure Laterality Date  . Abdominal hysterectomy     Family History  Problem Relation Age of Onset  . Coronary artery disease    . Cerebral aneurysm Mother 53    ruptured  . Heart attack Father 25  . Hypertension Brother     severe  . Hypertension Mother     severe   History  Substance Use Topics  . Smoking status: Never Smoker   . Smokeless tobacco: Not on file  . Alcohol Use: Yes     Comment: social   OB History   Grav Para Term Preterm Abortions TAB SAB Ect Mult Living                 Review of Systems  Constitutional: Negative for fever and diaphoresis.  Respiratory: Positive for shortness of breath. Negative for cough.   Cardiovascular: Negative for chest pain and syncope.  Gastrointestinal: Negative for vomiting and abdominal pain.  All other systems reviewed and are negative.     Allergies  Latex  Home Medications   Prior to Admission medications   Medication Sig  Start Date End Date Taking? Authorizing Provider  cloNIDine (CATAPRES) 0.1 MG tablet Take 1 tablet (0.1 mg total) by mouth 2 (two) times daily. 04/06/14  Yes Minus Breeding, MD  diazepam (VALIUM) 5 MG tablet Take 1 tablet (5 mg total) by mouth every 8 (eight) hours as needed for muscle spasms (Take 1 tablet every 8 hours as needed for muscle spasms.). 10/27/13  Yes Hannah Muthersbaugh, PA-C  hydrochlorothiazide (HYDRODIURIL) 25 MG tablet Take 1 tablet (25 mg total) by mouth daily. 04/06/14  Yes Minus Breeding, MD  HYDROcodone-acetaminophen (NORCO/VICODIN) 5-325 MG per tablet Take 1 tablet by mouth every 6 (six) hours as needed for severe pain (Take 1 - 2 tablets every 4 - 6 hours.). 10/27/13  Yes Hannah Muthersbaugh, PA-C  lisinopril (PRINIVIL,ZESTRIL) 40 MG tablet Take 1 tablet (40 mg total) by mouth daily. 04/06/14  Yes Minus Breeding, MD  metoprolol (LOPRESSOR) 100 MG tablet Take 1 tablet (100 mg total) by mouth 3 (three) times daily. 04/13/14  Yes Minus Breeding, MD  potassium chloride (KLOR-CON) 10 MEQ CR tablet Take 20 mEq by mouth 2 (two) times daily.    Yes Historical Provider, MD  spironolactone (ALDACTONE) 50 MG tablet Take 1 tablet (50 mg total) by mouth daily. 04/06/14  Yes Minus Breeding, MD   BP 179/119  Pulse 69  Temp(Src) 98.1 F (36.7 C) (Oral)  Resp 15  SpO2 99% Physical Exam  Nursing note and vitals reviewed. Constitutional: She is oriented to person, place, and time. She appears well-developed and well-nourished. No distress.  HENT:  Head: Normocephalic and atraumatic.  Mouth/Throat: Oropharynx is clear and moist.  Eyes: EOM are normal. Pupils are equal, round, and reactive to light.  Neck: Normal range of motion. Neck supple.  Cardiovascular: Normal rate and regular rhythm.  Exam reveals no friction rub.   No murmur heard. Pulmonary/Chest: Effort normal and breath sounds normal. No respiratory distress. She has no wheezes. She has no rales.  Abdominal: Soft. She exhibits no  distension. There is no tenderness. There is no rebound.  Musculoskeletal: Normal range of motion. She exhibits no edema.  Neurological: She is alert and oriented to person, place, and time.  Skin: She is not diaphoretic.    ED Course  Procedures (including critical care time) Labs Review Labs Reviewed  BASIC METABOLIC PANEL - Abnormal; Notable for the following:    Potassium 3.6 (*)    Glucose, Bld 131 (*)    Creatinine, Ser 1.44 (*)    GFR calc non Af Amer 42 (*)    GFR calc Af Amer 48 (*)    All other components within normal limits  PRO B NATRIURETIC PEPTIDE - Abnormal; Notable for the following:    Pro B Natriuretic peptide (BNP) 2483.0 (*)    All other components within normal limits  CBC  I-STAT TROPOININ, ED    Imaging Review Dg Chest 2 View  08/05/2014   CLINICAL DATA:  Shortness of breath. Epigastric abdominal in chest pain for 3 weeks.  EXAM: CHEST  2 VIEW  COMPARISON:  01/18/2010  FINDINGS: Cardiac enlargement with normal pulmonary vascularity. No focal airspace disease or consolidation in the lungs. No blunting of costophrenic angles. No pneumothorax. Mediastinal contours appear intact.  IMPRESSION: Cardiac enlargement.  No evidence of active pulmonary disease.   Electronically Signed   By: Lucienne Capers M.D.   On: 08/05/2014 22:24     EKG Interpretation   Date/Time:  Friday August 05 2014 20:07:29 EDT Ventricular Rate:  82 PR Interval:  140 QRS Duration: 92 QT Interval:  430 QTC Calculation: 502 R Axis:   46 Text Interpretation:  Normal sinus rhythm Left ventricular hypertrophy  Nonspecific T wave abnormality Prolonged QT Mild T wave changes laterally,  different from prior Confirmed by [facility]  MD, [place] ([id number]) on 08/06/2014  12:58:27 AM      MDM   Final diagnoses:  Congestive heart failure, unspecified congestive heart failure chronicity, unspecified congestive heart failure type    77F here with 3 weeks of exertional abdominal pressure and  exertional SOB. Intermittent for past 3 weeks, worsening. Hx of HTN. Labs show elevated BNP. Admitted.    Evelina Bucy, MD 08/06/14 [phone]

## 2014-08-06 NOTE — Progress Notes (Signed)
Echocardiogram 2D Echocardiogram has been performed.  Doyle Askew 08/06/2014, 10:42 AM

## 2014-08-07 DIAGNOSIS — Z9119 Patient's noncompliance with other medical treatment and regimen: Secondary | ICD-10-CM

## 2014-08-07 DIAGNOSIS — Z0389 Encounter for observation for other suspected diseases and conditions ruled out: Secondary | ICD-10-CM

## 2014-08-07 DIAGNOSIS — N289 Disorder of kidney and ureter, unspecified: Secondary | ICD-10-CM

## 2014-08-07 DIAGNOSIS — IMO0001 Reserved for inherently not codable concepts without codable children: Secondary | ICD-10-CM

## 2014-08-07 DIAGNOSIS — Z91199 Patient's noncompliance with other medical treatment and regimen due to unspecified reason: Secondary | ICD-10-CM

## 2014-08-07 LAB — GLUCOSE, CAPILLARY
GLUCOSE-CAPILLARY: 96 mg/dL (ref 70–99)
GLUCOSE-CAPILLARY: 97 mg/dL (ref 70–99)
Glucose-Capillary: 115 mg/dL — ABNORMAL HIGH (ref 70–99)
Glucose-Capillary: 103 mg/dL — ABNORMAL HIGH (ref 70–99)

## 2014-08-07 LAB — BASIC METABOLIC PANEL
Anion gap: 12 (ref 5–15)
BUN: 26 mg/dL — AB (ref 6–23)
CHLORIDE: 104 meq/L (ref 96–112)
CO2: 25 mEq/L (ref 19–32)
Calcium: 8.8 mg/dL (ref 8.4–10.5)
Creatinine, Ser: 1.48 mg/dL — ABNORMAL HIGH (ref 0.50–1.10)
GFR calc Af Amer: 47 mL/min — ABNORMAL LOW (ref >90)
GFR calc non Af Amer: 40 mL/min — ABNORMAL LOW (ref >90)
GLUCOSE: 98 mg/dL (ref 70–99)
POTASSIUM: 3.3 meq/L — AB (ref 3.7–5.3)
Sodium: 141 mEq/L (ref 137–147)

## 2014-08-07 LAB — PRO B NATRIURETIC PEPTIDE: PRO B NATRI PEPTIDE: 1324 pg/mL — AB (ref 0–125)

## 2014-08-07 MED ORDER — POTASSIUM CHLORIDE CRYS ER 20 MEQ PO TBCR
40.0000 meq | EXTENDED_RELEASE_TABLET | Freq: Once | ORAL | Status: AC
  Administered 2014-08-07: 40 meq via ORAL
  Filled 2014-08-07: qty 2

## 2014-08-07 MED ORDER — AMLODIPINE BESYLATE 5 MG PO TABS
5.0000 mg | ORAL_TABLET | Freq: Once | ORAL | Status: AC
  Administered 2014-08-07: 5 mg via ORAL
  Filled 2014-08-07: qty 1

## 2014-08-07 MED ORDER — AMLODIPINE BESYLATE 5 MG PO TABS
5.0000 mg | ORAL_TABLET | Freq: Every day | ORAL | Status: DC
Start: 2014-08-07 — End: 2014-08-08
  Administered 2014-08-07: 5 mg via ORAL
  Filled 2014-08-07 (×2): qty 1

## 2014-08-07 MED ORDER — FUROSEMIDE 10 MG/ML IJ SOLN
40.0000 mg | Freq: Two times a day (BID) | INTRAMUSCULAR | Status: AC
  Administered 2014-08-07 (×2): 40 mg via INTRAVENOUS
  Filled 2014-08-07 (×2): qty 4

## 2014-08-07 MED ORDER — LABETALOL HCL 5 MG/ML IV SOLN
20.0000 mg | Freq: Four times a day (QID) | INTRAVENOUS | Status: DC | PRN
  Filled 2014-08-07: qty 4

## 2014-08-07 NOTE — Progress Notes (Signed)
    Subjective:  SOB improved, no chest pain  Objective:  Vital Signs in the last 24 hours: Temp:  [98.1 F (36.7 C)-98.9 F (37.2 C)] 98.1 F (36.7 C) (10/04 0521) Pulse Rate:  [73-85] 81 (10/04 0521) Resp:  [17-18] 18 (10/04 0521) BP: (138-173)/(92-116) 170/114 mmHg (10/04 0921) SpO2:  [99 %-100 %] 100 % (10/04 0521) Weight:  [201 lb 11.2 oz (91.491 kg)] 201 lb 11.2 oz (91.491 kg) (10/04 0521)  Intake/Output from previous day:  Intake/Output Summary (Last 24 hours) at 08/07/14 1046 Last data filed at 08/07/14 0900  Gross per 24 hour  Intake    483 ml  Output   1500 ml  Net  -1017 ml    Physical Exam: General appearance: alert, cooperative, no distress and moderately obese Lungs: clear to auscultation bilaterally Heart: regular rate and rhythm Extremities: trace edema, compression stockings in place   Rate: 78  Rhythm: normal sinus rhythm  Lab Results:  Recent Labs  08/05/14 2031  WBC 5.2  HGB 12.6  PLT 262    Recent Labs  08/05/14 2031 08/07/14 0410  NA 142 141  K 3.6* 3.3*  CL 105 104  CO2 25 25  GLUCOSE 131* 98  BUN 21 26*  CREATININE 1.44* 1.48*    Recent Labs  08/06/14 0240 08/06/14 0713  TROPONINI <0.30 <0.30   No results found for this basename: INR,  in the last 72 hours  Imaging: Imaging results have been reviewed  Cardiac Studies:  Assessment/Plan:   Principal Problem:   Acute on chronic systolic CHF (congestive heart failure), NYHA class 4 Active Problems:   Uncontrolled hypertension   NICM- EF 25-30% echo 08/05/14   Non compliance with medical treatment   Obesity (BMI 30-39.9)   DYSPNEA   Chest pain- MI r/o   Normal coronary arteries 2006    PLAN: B/P still not controlled- add Norvasc. I/O negative 900 cc, no change in wgt, BNP down to 1324. ? Push Lasix one more day.   Kerin Ransom PA-C Beeper [phone] 08/07/2014, 10:46 AM   3

## 2014-08-07 NOTE — Progress Notes (Signed)
Pt. Seen and examined. Agree with the NP/PA-C note as written.  Agree with additional lasix today - increase lasix to 40 mg IV BID today, may be able to change to po tomorrow. ?need for Lifevest prior to d/c due to newly decreased EF of 25-30%. Discussed briefly with her today. Agree with adding norvasc for better BP control.  Pixie Casino, MD, [facility] Attending Cardiologist [place]

## 2014-08-07 NOTE — Progress Notes (Signed)
Patient is sleeping peacefully. Denies c/o pain discomfort. Maintained on telemetry and is NSR with a heart rate of 89. Will continue to monitor.  Esperanza Heir, RN

## 2014-08-07 NOTE — Progress Notes (Signed)
Bp continues to be elevated. Kerin Ransom PA aware earlier. Pt voices no complaints.

## 2014-08-07 NOTE — Care Management Note (Addendum)
    Page 1 of 2   08/09/2014     1:57:11 PM CARE MANAGEMENT NOTE 08/09/2014  Patient:  [name], [name]   Account Number:  [id number]  Date Initiated:  08/07/2014  Documentation initiated by:  [facility]  Subjective/Objective Assessment:   adm: shortness of breath     Action/Plan:   IV diureses; LifeVest.//discharge planning   Anticipated DC Date:  08/10/2014   Anticipated DC Plan:  [place]  CM consult  PCP issues  Medication Assistance      PAC Choice  DURABLE MEDICAL EQUIPMENT   Choice offered to / List presented to:     DME arranged  VEST - LIFE VEST           Status of service:  In process, will continue to follow Medicare Important Message given?   (If response is "NO", the following Medicare IM given date fields will be blank) Date Medicare IM given:   Medicare IM given by:   Date Additional Medicare IM given:   Additional Medicare IM given by:    Discharge Disposition:    Per UR Regulation:  Reviewed for med. necessity/level of care/duration of stay  If discussed at [place] of Stay Meetings, dates discussed:    Comments:  08/09/14 [place], RN, BSN, [place] [phone] Josem Kaufmann required [phone] co-pay for 30 day supply $50 patient can use: [facility], [employer], [facility]  08/09/14 [place], RN, BSN, [employer] [phone] Spoke with pt at bedside regarding benefits check for [facility].  Pt has brochure with 30 day free card and refill assistance card intact.  Pt utilizes [employer] [facility] on [address] for prescription needs.  NCM called pharmacy to confirm availability of medication; medication will be available by the time the patient needs it (36 hours). Information relayed to pt.  Pt verbalizes importance of filling medication upon discharge.  08/08/14 [place], RN, BSN, [employer] [phone] B/P still not controlled. Good diuresis yesterday with IV Lasix. Life Vest ordered.  08/07/14  09:15 CM met with pt and gave her [name] P[facility] Resource List handout.  Pt verbalizes understanding importance to call and secure [name] PCP post hospitalization.  Will continue to monitor for other CM needs.  Mariane Masters, BSN, CM [phone].

## 2014-08-08 DIAGNOSIS — I10 Essential (primary) hypertension: Secondary | ICD-10-CM | POA: Diagnosis present

## 2014-08-08 DIAGNOSIS — Z0389 Encounter for observation for other suspected diseases and conditions ruled out: Secondary | ICD-10-CM

## 2014-08-08 DIAGNOSIS — I509 Heart failure, unspecified: Secondary | ICD-10-CM

## 2014-08-08 DIAGNOSIS — N289 Disorder of kidney and ureter, unspecified: Secondary | ICD-10-CM

## 2014-08-08 LAB — BASIC METABOLIC PANEL
Anion gap: 12 (ref 5–15)
BUN: 26 mg/dL — ABNORMAL HIGH (ref 6–23)
CALCIUM: 9.1 mg/dL (ref 8.4–10.5)
CHLORIDE: 102 meq/L (ref 96–112)
CO2: 26 meq/L (ref 19–32)
CREATININE: 1.46 mg/dL — AB (ref 0.50–1.10)
GFR calc Af Amer: 47 mL/min — ABNORMAL LOW (ref >90)
GFR calc non Af Amer: 41 mL/min — ABNORMAL LOW (ref >90)
GLUCOSE: 99 mg/dL (ref 70–99)
Potassium: 3.4 mEq/L — ABNORMAL LOW (ref 3.7–5.3)
Sodium: 140 mEq/L (ref 137–147)

## 2014-08-08 LAB — GLUCOSE, CAPILLARY
Glucose-Capillary: 99 mg/dL (ref 70–99)
Glucose-Capillary: 111 mg/dL — ABNORMAL HIGH (ref 70–99)
Glucose-Capillary: 101 mg/dL — ABNORMAL HIGH (ref 70–99)
Glucose-Capillary: 118 mg/dL — ABNORMAL HIGH (ref 70–99)

## 2014-08-08 MED ORDER — LABETALOL HCL 5 MG/ML IV SOLN
20.0000 mg | Freq: Four times a day (QID) | INTRAVENOUS | Status: DC | PRN
  Administered 2014-08-08: 20 mg via INTRAVENOUS
  Filled 2014-08-08 (×2): qty 4

## 2014-08-08 MED ORDER — ISOSORB DINITRATE-HYDRALAZINE 20-37.5 MG PO TABS
1.0000 | ORAL_TABLET | Freq: Three times a day (TID) | ORAL | Status: DC
  Administered 2014-08-08 – 2014-08-09 (×5): 1 via ORAL
  Filled 2014-08-08 (×6): qty 1

## 2014-08-08 MED ORDER — AMLODIPINE BESYLATE 10 MG PO TABS
10.0000 mg | ORAL_TABLET | Freq: Every day | ORAL | Status: DC
  Administered 2014-08-08 – 2014-08-09 (×2): 10 mg via ORAL
  Filled 2014-08-08 (×2): qty 1

## 2014-08-08 NOTE — Progress Notes (Signed)
Patient with elevated blood pressure of 189/123. Dr. Wynonia Lawman, cardiologist on call, paged and informed. He ordered an additional dose of Norvasc 5 mg to be given PO and also gave an as needed order for labatelol 20 mg IVP every six hours as needed for systolic BP greater than 389. This Probation officer v/u and administered medication as per MD order. Patient is completely asymptomatic. Will continue to monitor.  Esperanza Heir, RN

## 2014-08-08 NOTE — Progress Notes (Signed)
    Subjective:  SOB improved.  Objective:  Vital Signs in the last 24 hours: Temp:  [97.9 F (36.6 C)-98.6 F (37 C)] 97.9 F (36.6 C) (10/05 0450) Pulse Rate:  [80-94] 89 (10/05 0450) Resp:  [18-20] 18 (10/05 0450) BP: (150-190)/(100-152) 190/152 mmHg (10/05 0542) SpO2:  [98 %-100 %] 99 % (10/05 0450) Weight:  [198 lb 1.6 oz (89.858 kg)] 198 lb 1.6 oz (89.858 kg) (10/05 0450)  Intake/Output from previous day:  Intake/Output Summary (Last 24 hours) at 08/08/14 0818 Last data filed at 08/08/14 0536  Gross per 24 hour  Intake   1200 ml  Output   3550 ml  Net  -2350 ml    Physical Exam: General appearance: alert, cooperative, no distress and moderately obese Neck: no carotid bruit and no JVD Lungs: clear to auscultation bilaterally Heart: regular rate and rhythm   Rate: 88  Rhythm: normal sinus rhythm  Lab Results:  Recent Labs  08/05/14 2031  WBC 5.2  HGB 12.6  PLT 262    Recent Labs  08/07/14 0410 08/08/14 0300  NA 141 140  K 3.3* 3.4*  CL 104 102  CO2 25 26  GLUCOSE 98 99  BUN 26* 26*  CREATININE 1.48* 1.46*    Recent Labs  08/06/14 0240 08/06/14 0713  TROPONINI <0.30 <0.30   No results found for this basename: INR,  in the last 72 hours  Imaging: Imaging results have been reviewed  Cardiac Studies:  Assessment/Plan:  53 y.o. Female, followed by Dr Percival Spanish with with known poorly controlled hypertension who presented 10/02 shortness of breath. She admitted to medication non compliance- "thought he medicines were making me feel bad".  EF noted to be 25% by echo. Life vest recommended.     Principal Problem:   Acute on chronic systolic CHF (congestive heart failure), NYHA class 4 Active Problems:   Uncontrolled hypertension   NICM- EF 25-30% echo 08/05/14   Non compliance with medical treatment   Renal insufficiency- unclear if chronic or not   Obesity (BMI 30-39.9)   DYSPNEA   Chest pain- MI r/o   Normal coronary arteries  2006    PLAN: B/P still not controlled. Good diuresis yesterday with IV Lasix. Life Vest ordered. Increase Norvaasc to 10 mg, increase Bidil to TID. ? Continue IV lasix one more day.   Kerin Ransom PA-C Beeper [phone] 08/08/2014, 8:18 AM   I have seen and examined the patient along with Kerin Ransom PA-C.  I have reviewed the chart, notes and new data.  I agree with PA's note.  Mrs. Brightbill has clear evidence of malignant hypertension with severe end organ involvement (systolic heart failure and moderate renal failure). Aggressive blood pressure control is the mainstay of therapy. She has responded well to diuretics yesterday but remains markedly hypertensive. Agree with continuing intravenous diuretics for one more day, increasing amlodipine and isosorbide/hydralazine. Also recommend switching metoprolol to carvedilol for additional blood pressure control. If these interventions not correct her blood pressure, consider minoxidil.  Sanda Klein, MD, [place] [phone] 08/08/2014, 8:38 AM

## 2014-08-08 NOTE — Progress Notes (Signed)
Recheck of patient's blood pressure is 160/105 in her right forearm. Patient continues to deny c/o pain/headache, blurred vision, dizziness/light-headedness, or shortness of breath with exertion. Patient maintained on the monitor and is in NSR. Will continue to monitor.  Esperanza Heir, RN

## 2014-08-08 NOTE — Progress Notes (Signed)
Patient's BP elevated at 190/152. Patient states she is starting to feel a slight headache, rating it a 4/10. Administered 20 mg of Labetalol IVP as per MD prn order for systolic BP greater than 583. Will continue to monitor.  Esperanza Heir, RN

## 2014-08-09 LAB — BASIC METABOLIC PANEL
Anion gap: 15 (ref 5–15)
BUN: 31 mg/dL — ABNORMAL HIGH (ref 6–23)
CALCIUM: 9.2 mg/dL (ref 8.4–10.5)
CO2: 23 meq/L (ref 19–32)
Chloride: 107 mEq/L (ref 96–112)
Creatinine, Ser: 1.38 mg/dL — ABNORMAL HIGH (ref 0.50–1.10)
GFR calc Af Amer: 51 mL/min — ABNORMAL LOW (ref >90)
GFR calc non Af Amer: 44 mL/min — ABNORMAL LOW (ref >90)
Glucose, Bld: 92 mg/dL (ref 70–99)
Potassium: 3.9 mEq/L (ref 3.7–5.3)
Sodium: 145 mEq/L (ref 137–147)

## 2014-08-09 LAB — GLUCOSE, CAPILLARY
GLUCOSE-CAPILLARY: 97 mg/dL (ref 70–99)
Glucose-Capillary: 95 mg/dL (ref 70–99)
Glucose-Capillary: 125 mg/dL — ABNORMAL HIGH (ref 70–99)

## 2014-08-09 MED ORDER — FUROSEMIDE 40 MG PO TABS
40.0000 mg | ORAL_TABLET | Freq: Every day | ORAL | Status: DC
  Administered 2014-08-09: 40 mg via ORAL
  Filled 2014-08-09: qty 1

## 2014-08-09 MED ORDER — FUROSEMIDE 40 MG PO TABS: 40.0000 mg | ORAL_TABLET | Freq: Every day | ORAL | Status: DC

## 2014-08-09 MED ORDER — SACUBITRIL-VALSARTAN 49-51 MG PO TABS: 1.0000 | ORAL_TABLET | Freq: Two times a day (BID) | ORAL | Status: DC

## 2014-08-09 MED ORDER — CARVEDILOL 12.5 MG PO TABS: 12.5000 mg | ORAL_TABLET | Freq: Two times a day (BID) | ORAL | Status: DC

## 2014-08-09 MED ORDER — ACETAMINOPHEN 325 MG PO TABS: 650.0000 mg | ORAL_TABLET | ORAL | Status: DC | PRN

## 2014-08-09 MED ORDER — CARVEDILOL 12.5 MG PO TABS
12.5000 mg | ORAL_TABLET | Freq: Two times a day (BID) | ORAL | Status: DC
  Administered 2014-08-09 (×2): 12.5 mg via ORAL
  Filled 2014-08-09 (×3): qty 1

## 2014-08-09 MED ORDER — AMLODIPINE BESYLATE 10 MG PO TABS
10.0000 mg | ORAL_TABLET | Freq: Every day | ORAL | Status: DC
Start: 2014-08-09 — End: 2015-01-02

## 2014-08-09 NOTE — Discharge Instructions (Signed)
Hypertension Hypertension is another name for high blood pressure. High blood pressure forces your heart to work harder to pump blood. A blood pressure reading has two numbers, which includes a higher number over a lower number (example: 110/72). HOME CARE   Have your blood pressure rechecked by your doctor.  Only take medicine as told by your doctor. Follow the directions carefully. The medicine does not work as well if you skip doses. Skipping doses also puts you at risk for problems.  Do not smoke.  Monitor your blood pressure at home as told by your doctor. GET HELP IF:  You think you are having a reaction to the medicine you are taking.  You have repeat headaches or feel dizzy.  You have puffiness (swelling) in your ankles.  You have trouble with your vision. GET HELP RIGHT AWAY IF:   You get a very bad headache and are confused.  You feel weak, numb, or faint.  You get chest or belly (abdominal) pain.  You throw up (vomit).  You cannot breathe very well. MAKE SURE YOU:   Understand these instructions.  Will watch your condition.  Will get help right away if you are not doing well or get worse. Document Released: 04/08/2008 Document Revised: 10/26/2013 Document Reviewed: 08/13/2013 [employer] Patient Information 2015 [place], [place]. This information is not intended to replace advice given to you by your health care provider. Make sure you discuss any questions you have with your health care provider. Heart Failure Heart failure means your heart has trouble pumping blood. This makes it hard for your body to work well. Heart failure is usually a long-term (chronic) condition. You must take good care of yourself and follow your doctor's treatment plan. HOME CARE  Take your heart medicine as told by your doctor.  Do not stop taking medicine unless your doctor tells you to.  Do not skip any dose of medicine.  Refill your medicines before they run out.  Take other  medicines only as told by your doctor or pharmacist.  Stay active if told by your doctor. The elderly and people with severe heart failure should talk with a doctor about physical activity.  Eat heart-healthy foods. Choose foods that are without trans fat and are low in saturated fat, cholesterol, and salt (sodium). This includes fresh or frozen fruits and vegetables, fish, lean meats, fat-free or low-fat dairy foods, whole grains, and high-fiber foods. Lentils and dried peas and beans (legumes) are also good choices.  Limit salt if told by your doctor.  Cook in a healthy way. Roast, grill, broil, bake, poach, steam, or stir-fry foods.  Limit fluids as told by your doctor.  Weigh yourself every morning. Do this after you pee (urinate) and before you eat breakfast. Write down your weight to give to your doctor.  Take your blood pressure and write it down if your doctor tells you to.  Ask your doctor how to check your pulse. Check your pulse as told.  Lose weight if told by your doctor.  Stop smoking or chewing tobacco. Do not use gum or patches that help you quit without your doctor's approval.  Schedule and go to doctor visits as told.  Nonpregnant women should have no more than 1 drink a day. Men should have no more than 2 drinks a day. Talk to your doctor about drinking alcohol.  Stop illegal drug use.  Stay current with shots (immunizations).  Manage your health conditions as told by your doctor.  Learn  to manage your stress.  Rest when you are tired.  If it is really hot outside:  Avoid intense activities.  Use air conditioning or fans, or get in a cooler place.  Avoid caffeine and alcohol.  Wear loose-fitting, lightweight, and light-colored clothing.  If it is really cold outside:  Avoid intense activities.  Layer your clothing.  Wear mittens or gloves, a hat, and a scarf when going outside.  Avoid alcohol.  Learn about heart failure and get support as  needed.  Get help to maintain or improve your quality of life and your ability to care for yourself as needed. GET HELP IF:   You gain 03 lb/1.4 kg or more in 1 day or 05 lb/2.3 kg in a week.  You are more short of breath than usual.  You cannot do your normal activities.  You tire easily.  You cough more than normal, especially with activity.  You have any or more puffiness (swelling) in areas such as your hands, feet, ankles, or belly (abdomen).  You cannot sleep because it is hard to breathe.  You feel like your heart is beating fast (palpitations).  You get dizzy or light-headed when you stand up. GET HELP RIGHT AWAY IF:   You have trouble breathing.  There is a change in mental status, such as becoming less alert or not being able to focus.  You have chest pain or discomfort.  You faint. MAKE SURE YOU:   Understand these instructions.  Will watch your condition.  Will get help right away if you are not doing well or get worse. Document Released: 07/30/2008 Document Revised: 03/07/2014 Document Reviewed: 12/07/2012 [facility] Patient Information 2015 [place], [place]. This information is not intended to replace advice given to you by your health care provider. Make sure you discuss any questions you have with your health care provider.

## 2014-08-09 NOTE — Discharge Summary (Signed)
Patient ID: [name],  MRN: [id number], DOB/AGE: June 26, 1961 53 y.o.  Admit date: 08/05/2014 Discharge date: 08/09/2014  Primary Care Provider: No PCP Per Patient Primary Cardiologist: Dr Percival Spanish  Discharge Diagnoses Principal Problem:   Acute on chronic systolic CHF (congestive heart failure), NYHA class 4 Active Problems:   Uncontrolled hypertension   NICM- EF 25-30% echo 08/05/14   Non compliance with medical treatment   Renal insufficiency- unclear if chronic or not   Obesity (BMI 30-39.9)   DYSPNEA   Chest pain- MI r/o   Normal coronary arteries 2006   Essential hypertension, malignant    Hospital Course:  53 y.o. female, followed by Dr Percival Spanish with with known poorly controlled hypertension who presented 10/02 with shortness of breath. She admitted to medication non compliance- "thought he medicines were making me feel bad". It turns out she was having side effects from Clonidine.  EF was noted to be 25% by echo. Her medications were adjusted for HTN. She was diuresed but only lost 3 lbs. She is asymptomatic at discharge. Life Vest was arranged. At discharge we decided to change her Lisinopril and BiDil to [facility]. She will follow up in the office in one to two weeks. If her B/P is still elevated we could add back BiDil, or increase Entresto. She will need a BMP in follow up.    Discharge Vitals:  Blood pressure 150/101, pulse 81, temperature 98.3 F (36.8 C), temperature source Oral, resp. rate 17, height 5\' 7"  (1.702 m), weight 198 lb 13.7 oz (90.2 kg), SpO2 100.00%.    Labs: Results for orders placed during the hospital encounter of 08/05/14 (from the past 24 hour(s))  GLUCOSE, CAPILLARY     Status: Abnormal   Collection Time    08/08/14 11:11 AM      Result Value Ref Range   Glucose-Capillary 111 (*) 70 - 99 mg/dL   Comment 1 Notify RN    GLUCOSE, CAPILLARY     Status: Abnormal   Collection Time    08/08/14  3:39 PM      Result Value Ref Range   Glucose-Capillary 101 (*) 70 - 99 mg/dL  GLUCOSE, CAPILLARY     Status: Abnormal   Collection Time    08/08/14  8:49 PM      Result Value Ref Range   Glucose-Capillary 118 (*) 70 - 99 mg/dL   Comment 1 Documented in Chart     Comment 2 Notify RN    BASIC METABOLIC PANEL     Status: Abnormal   Collection Time    08/09/14  3:49 AM      Result Value Ref Range   Sodium 145  137 - 147 mEq/L   Potassium 3.9  3.7 - 5.3 mEq/L   Chloride 107  96 - 112 mEq/L   CO2 23  19 - 32 mEq/L   Glucose, Bld 92  70 - 99 mg/dL   BUN 31 (*) 6 - 23 mg/dL   Creatinine, Ser 1.38 (*) 0.50 - 1.10 mg/dL   Calcium 9.2  8.4 - 10.5 mg/dL   GFR calc non Af Amer 44 (*) >90 mL/min   GFR calc Af Amer 51 (*) >90 mL/min   Anion gap 15  5 - 15  GLUCOSE, CAPILLARY     Status: None   Collection Time    08/09/14  6:02 AM      Result Value Ref Range   Glucose-Capillary 95  70 - 99 mg/dL  Comment 1 Notify RN      Disposition:  Follow-up Information   Follow up with Minus Breeding, MD. (office will call you with an appointment)    Specialty:  Cardiology   Contact information:   [address] [place] [address] [phone]       Discharge Medications:    Medication List    STOP taking these medications       cloNIDine 0.1 MG tablet  Commonly known as:  CATAPRES     hydrochlorothiazide 25 MG tablet  Commonly known as:  HYDRODIURIL     lisinopril 40 MG tablet  Commonly known as:  PRINIVIL,ZESTRIL     metoprolol 100 MG tablet  Commonly known as:  LOPRESSOR     potassium chloride 10 MEQ CR tablet  Commonly known as:  KLOR-CON      TAKE these medications       acetaminophen 325 MG tablet  Commonly known as:  TYLENOL  Take 2 tablets (650 mg total) by mouth every 4 (four) hours as needed for headache or mild pain.     amLODipine 10 MG tablet  Commonly known as:  NORVASC  Take 1 tablet (10 mg total) by mouth daily.     carvedilol 12.5 MG tablet  Commonly known as:  COREG  Take  1 tablet (12.5 mg total) by mouth 2 (two) times daily with a meal.     diazepam 5 MG tablet  Commonly known as:  VALIUM  Take 1 tablet (5 mg total) by mouth every 8 (eight) hours as needed for muscle spasms (Take 1 tablet every 8 hours as needed for muscle spasms.).     furosemide 40 MG tablet  Commonly known as:  LASIX  Take 1 tablet (40 mg total) by mouth daily.     HYDROcodone-acetaminophen 5-325 MG per tablet  Commonly known as:  NORCO/VICODIN  Take 1 tablet by mouth every 6 (six) hours as needed for severe pain (Take 1 - 2 tablets every 4 - 6 hours.).     Sacubitril-Valsartan 49-51 MG Tabs  Commonly known as:  ENTRESTO  Take 1 tablet by mouth 2 (two) times daily at 8 am and 10 pm.     spironolactone 50 MG tablet  Commonly known as:  ALDACTONE  Take 1 tablet (50 mg total) by mouth daily.         Duration of Discharge Encounter: Greater than 30 minutes including physician time.  Angelena Form PA-C 08/09/2014 8:56 AM

## 2014-08-09 NOTE — Progress Notes (Signed)
    Subjective:  No SOB.  Objective:  Vital Signs in the last 24 hours: Temp:  [97.9 F (36.6 C)-98.4 F (36.9 C)] 98.3 F (36.8 C) (10/06 0554) Pulse Rate:  [81-89] 81 (10/06 0554) Resp:  [17-18] 17 (10/06 0554) BP: (142-169)/(92-114) 150/101 mmHg (10/06 0554) SpO2:  [100 %] 100 % (10/06 0554) Weight:  [198 lb 13.7 oz (90.2 kg)] 198 lb 13.7 oz (90.2 kg) (10/06 0554)  Intake/Output from previous day:  Intake/Output Summary (Last 24 hours) at 08/09/14 0817 Last data filed at 08/09/14 4580  Gross per 24 hour  Intake   1160 ml  Output   1625 ml  Net   -465 ml    Physical Exam: General appearance: alert, cooperative, no distress and moderately obese Lungs: clear to auscultation bilaterally Heart: regular rate and rhythm   Rate: 78  Rhythm: normal sinus rhythm and PACs  Lab Results: No results found for this basename: WBC, HGB, PLT,  in the last 72 hours  Recent Labs  08/08/14 0300 08/09/14 0349  NA 140 145  K 3.4* 3.9  CL 102 107  CO2 26 23  GLUCOSE 99 92  BUN 26* 31*  CREATININE 1.46* 1.38*   No results found for this basename: TROPONINI, CK, MB,  in the last 72 hours No results found for this basename: INR,  in the last 72 hours  Imaging: Imaging results have been reviewed  Cardiac Studies:  Assessment/Plan:  53 y.o. Female, followed by Dr Percival Spanish with with known poorly controlled hypertension  who presented 10/02 shortness of breath. She admitted to medication non compliance- "thought he medicines were making me feel bad". EF noted to be 25% by echo. Life vest recommended. Her medications have been adjusted, Life Vest arranged.    Principal Problem:   Acute on chronic systolic CHF (congestive heart failure), NYHA class 4 Active Problems:   Uncontrolled hypertension   NICM- EF 25-30% echo 08/05/14   Non compliance with medical treatment   Renal insufficiency- unclear if chronic or not   Obesity (BMI 30-39.9)   DYSPNEA   Chest pain- MI r/o  Normal coronary arteries 2006   Essential hypertension, malignant    PLAN: Change Metoprolol to Coreg. She will need follow up in one week with a f/u BMP. Add po Lasix. ? Is she a candidate for [employer] [phone] 08/09/2014, 8:17 AM  I have seen and examined the patient along with Kerin Ransom PA-C.  I have reviewed the chart, notes and new data.  I agree with PA's note.  PLAN: CHF compensated. BP still high. Will try Entresto. May need many more medication titration attempts before her final regimen is settled on. Will need close f/u.  Sanda Klein, MD, [place] [phone] 08/09/2014, 8:58 AM

## 2014-08-10 ENCOUNTER — Telehealth: Payer: Self-pay | Admitting: Cardiology

## 2014-08-10 MED ORDER — SACUBITRIL-VALSARTAN 49-51 MG PO TABS: 1.0000 | ORAL_TABLET | Freq: Two times a day (BID) | ORAL | Status: DC

## 2014-08-10 NOTE — Telephone Encounter (Signed)
Pt called in stating that she was admitted in the hospital 10/2 for CHF and Lurena Joiner was suppose to give her a work note to be out of work until next week but he did not. She also wanted to know if Dr. Percival Spanish authorized the new drug she is suppose to take.Marland KitchenMarland KitchenEntresto. Please call  Thanks

## 2014-08-10 NOTE — Telephone Encounter (Signed)
Close encounter 

## 2014-08-10 NOTE — Telephone Encounter (Signed)
Samples of Entresto 49mg /51mg  #14 given.

## 2014-08-10 NOTE — Telephone Encounter (Signed)
Needs a note stating she will need to be out of work for ? How long. She was D/C with a LifeVest.  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 Also she is to start the [employer]. States a form should be sent over from the pharmacy for Prior Auth which they will refax to [employer]. Follow up appt with Cecilie Kicks, NP 08/22/14. Message to Dr. Salome Spotted letter for work.

## 2014-08-10 NOTE — Telephone Encounter (Signed)
Returning your call. °

## 2014-08-15 ENCOUNTER — Telehealth: Payer: Self-pay | Admitting: Cardiology

## 2014-08-15 NOTE — Telephone Encounter (Signed)
Calling to see if Dr. Percival Spanish has wriiten a letter to say when she can go back to work . Please call     Thanks

## 2014-08-15 NOTE — Telephone Encounter (Signed)
[name] wants to know if we have written a note for her to go back to work , I don't remember writing one, is it ok to write a note for her and are there any restrictions

## 2014-08-16 ENCOUNTER — Telehealth: Payer: Self-pay | Admitting: Cardiology

## 2014-08-16 NOTE — Telephone Encounter (Signed)
forwarded to [facility]

## 2014-08-16 NOTE — Telephone Encounter (Signed)
Received Attending Physician Statement from [facility] via Fax.  Sent to [employer] [facility] [place] to sent letter/packet regarding signed ROI and payment. Sent on 10.13.15 lp

## 2014-08-16 NOTE — Telephone Encounter (Signed)
Liliane Channel was calling in stating that he faxed over some prior authorization forms and would like to know if they were received. Please call back  thanks

## 2014-08-16 NOTE — Telephone Encounter (Signed)
Pt wants to know if her letter is ready,she needs this asap.

## 2014-08-18 ENCOUNTER — Telehealth: Payer: Self-pay | Admitting: Cardiology

## 2014-08-18 NOTE — Telephone Encounter (Signed)
Called pt. Back and let her know that Dr. Percival Spanish is working on her note

## 2014-08-18 NOTE — Telephone Encounter (Signed)
called beverly back and told her to fax the forms to [phone]

## 2014-08-18 NOTE — Telephone Encounter (Signed)
Please call,concerning a letter or authorization.

## 2014-08-18 NOTE — Telephone Encounter (Signed)
Entresto call and informed that we do have the papers and will be handing them over to [place] [facility]

## 2014-08-18 NOTE — Telephone Encounter (Signed)
Called patient to let her know we did not have any samples of Entresto. Left a 1 month free trial card at front desk for patient to pick-up. Advised patient to also call [facility] location for samples. Per patient, pharmacy needs a prior authorization. Notified her I will talk with Hochrein's nurse as far as PA is concerned.

## 2014-08-18 NOTE — Telephone Encounter (Signed)
[name] was calling on to see if the request for disability was received.  Thanks

## 2014-08-18 NOTE — Telephone Encounter (Signed)
Pt would like samples of Entresto 49/51 please.

## 2014-08-22 ENCOUNTER — Encounter: Payer: Self-pay | Admitting: Cardiology

## 2014-08-22 ENCOUNTER — Ambulatory Visit (INDEPENDENT_AMBULATORY_CARE_PROVIDER_SITE_OTHER): Payer: Managed Care, Other (non HMO) | Admitting: Cardiology

## 2014-08-22 VITALS — BP 128/90 | HR 85 | Ht 67.0 in | Wt 201.8 lb

## 2014-08-22 DIAGNOSIS — N183 Chronic kidney disease, stage 3 unspecified: Secondary | ICD-10-CM

## 2014-08-22 DIAGNOSIS — I429 Cardiomyopathy, unspecified: Secondary | ICD-10-CM

## 2014-08-22 DIAGNOSIS — I10 Essential (primary) hypertension: Secondary | ICD-10-CM

## 2014-08-22 DIAGNOSIS — E876 Hypokalemia: Secondary | ICD-10-CM

## 2014-08-22 DIAGNOSIS — Z9189 Other specified personal risk factors, not elsewhere classified: Secondary | ICD-10-CM | POA: Insufficient documentation

## 2014-08-22 DIAGNOSIS — N289 Disorder of kidney and ureter, unspecified: Secondary | ICD-10-CM

## 2014-08-22 DIAGNOSIS — I5022 Chronic systolic (congestive) heart failure: Secondary | ICD-10-CM

## 2014-08-22 DIAGNOSIS — I428 Other cardiomyopathies: Secondary | ICD-10-CM

## 2014-08-22 LAB — BASIC METABOLIC PANEL WITH GFR
BUN: 39 mg/dL — ABNORMAL HIGH (ref 6–23)
CALCIUM: 10.2 mg/dL (ref 8.4–10.5)
CO2: 21 mEq/L (ref 19–32)
CREATININE: 1.95 mg/dL — AB (ref 0.50–1.10)
Chloride: 105 mEq/L (ref 96–112)
GFR, Est African American: 34 mL/min — ABNORMAL LOW
GFR, Est Non African American: 29 mL/min — ABNORMAL LOW
Glucose, Bld: 96 mg/dL (ref 70–99)
Potassium: 4.8 mEq/L (ref 3.5–5.3)
Sodium: 140 mEq/L (ref 135–145)

## 2014-08-22 NOTE — Assessment & Plan Note (Signed)
Recheck labs today. 

## 2014-08-22 NOTE — Assessment & Plan Note (Signed)
controlled 

## 2014-08-22 NOTE — Assessment & Plan Note (Signed)
stable °

## 2014-08-22 NOTE — Patient Instructions (Signed)
Your physician recommends that you schedule a follow-up appointment in: 4 Weeks with Dr Percival Spanish or App  Your physician has requested that you have an echocardiogram. Echocardiography is a painless test that uses sound waves to create images of your heart. It provides your doctor with information about the size and shape of your heart and how well your heart's chambers and valves are working. This procedure takes approximately one hour. There are no restrictions for this procedure. January  Your physician recommends that you return for lab work in: [facility]

## 2014-08-22 NOTE — Progress Notes (Signed)
08/23/2014   PCP: No PCP Per Patient   Chief Complaint  Patient presents with  . Follow-up    post hospital for SOB and chets pressure, pt denied chest pain    Primary Cardiologist: Dr. Vita Barley   HPI:  53 y.o. female, followed by Dr Percival Spanish with with known poorly controlled hypertension who presented to [facility] 10/02 with shortness of breath. She admitted to medication non compliance- "thought her medicines were making me feel bad". It turns out she was having side effects from Clonidine. EF was noted to be 25% by echo. Her medications were adjusted for HTN. She was diuresed but only lost 3 lbs. Shewas asymptomatic at discharge. Life Vest was arranged. At discharge her Lisinopril and BiDil were changed to [facility].   She is here today for follow up. She feels much better, no chest pain and no SOB  She has stopped adding salt and is watching her diet.  She is having disability forms filled out  To be out for 3 months until 3 month Echo can be done.    She has been on Entresto for almost 2 weeks, will check BMP if Cr stable would increase Entresto in 2 weeks.    Allergies  Allergen Reactions  . Latex Itching    Current Outpatient Prescriptions  Medication Sig Dispense Refill  . acetaminophen (TYLENOL) 325 MG tablet Take 2 tablets (650 mg total) by mouth every 4 (four) hours as needed for headache or mild pain.      Marland Kitchen amLODipine (NORVASC) 10 MG tablet Take 1 tablet (10 mg total) by mouth daily.  30 tablet  11  . carvedilol (COREG) 12.5 MG tablet Take 1 tablet (12.5 mg total) by mouth 2 (two) times daily with a meal.  60 tablet  11  . furosemide (LASIX) 40 MG tablet Take 1 tablet (40 mg total) by mouth daily.  30 tablet  11  . Sacubitril-Valsartan (ENTRESTO) 49-51 MG TABS Take 1 tablet by mouth 2 (two) times daily at 8 am and 10 pm.  28 tablet  0  . spironolactone (ALDACTONE) 50 MG tablet Take 1 tablet (50 mg total) by mouth daily.  30 tablet  6  . amLODipine  (NORVASC) 10 MG tablet Take 1 tablet (10 mg total) by mouth daily.  30 tablet  2   No current facility-administered medications for this visit.    Past Medical History  Diagnosis Date  . Overweight(278.02)   . 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 Hypertension   . Sciatica     Past Surgical History  Procedure Laterality Date  . Abdominal hysterectomy      FWY:OVZCHYI:FO colds or fevers, no weight changes, weight at home 198 Skin:no rashes or ulcers HEENT:no blurred vision, no congestion CV:see HPI PUL:see HPI GI:no diarrhea constipation or melena, no indigestion GU:no hematuria, no dysuria MS:no joint pain, no claudication Neuro:no syncope, no lightheadedness Endo:no diabetes, no thyroid disease  Wearing life vest  Wt Readings from Last 3 Encounters:  08/22/14 201 lb 12.8 oz (91.536 kg)  08/09/14 198 lb 13.7 oz (90.2 kg)  01/22/13 213 lb (96.616 kg)    PHYSICAL EXAM BP 128/90  Pulse 85  Ht 5\' 7"  (1.702 m)  Wt 201 lb 12.8 oz (91.536 kg)  BMI 31.60 kg/m2 General:Pleasant affect, NAD Skin:Warm and dry, brisk capillary refill HEENT:normocephalic, sclera clear, mucus membranes moist Neck:supple, no JVD, no bruits  Heart:S1S2 RRR without murmur, gallup, rub or click Lungs:clear without rales, rhonchi, or wheezes IEP:PIRJ, non tender, + BS, do not palpate liver spleen or masses Ext:no lower ext edema, 2+ pedal pulses, 2+ radial pulses Neuro:alert and oriented, MAE, follows commands, + facial symmetry  EKG:SR rate of 85 no acute changes.    ASSESSMENT AND PLAN NICM- EF 25-30% echo 08/05/14 Will increase entresto after 1 month-on 49-51 currently  09/06/14 if Cr is stable.  Will check labs today.   She is euvolemic.  No SOB, no chest pain.  Renal insufficiency- unclear if chronic or not Recheck labs today.   Uncontrolled hypertension controlled  Chronic systolic HF (heart failure) stable  At risk for sudden cardiac  death Wearing life vest

## 2014-08-22 NOTE — Assessment & Plan Note (Signed)
Will increase entresto after 1 month-on 49-51 currently  09/06/14 if Cr is stable.  Will check labs today.   She is euvolemic.  No SOB, no chest pain.

## 2014-08-23 NOTE — Assessment & Plan Note (Signed)
Wearing life vest

## 2014-08-24 NOTE — Telephone Encounter (Signed)
She needs tele for volume overload.  Please let Trish know that she will need an admission note.  I am in the hospital that day and should be able to see her.

## 2014-08-25 ENCOUNTER — Other Ambulatory Visit: Payer: Self-pay | Admitting: *Deleted

## 2014-08-25 DIAGNOSIS — E878 Other disorders of electrolyte and fluid balance, not elsewhere classified: Secondary | ICD-10-CM

## 2014-09-07 ENCOUNTER — Other Ambulatory Visit: Payer: Self-pay | Admitting: *Deleted

## 2014-09-07 DIAGNOSIS — Z79899 Other long term (current) drug therapy: Secondary | ICD-10-CM

## 2014-09-07 LAB — BASIC METABOLIC PANEL
BUN: 45 mg/dL — ABNORMAL HIGH (ref 6–23)
CALCIUM: 9.7 mg/dL (ref 8.4–10.5)
CO2: 22 mEq/L (ref 19–32)
CREATININE: 2.18 mg/dL — AB (ref 0.50–1.10)
Chloride: 102 mEq/L (ref 96–112)
GLUCOSE: 89 mg/dL (ref 70–99)
Potassium: 5 mEq/L (ref 3.5–5.3)
SODIUM: 136 meq/L (ref 135–145)

## 2014-09-24 ENCOUNTER — Telehealth: Payer: Self-pay | Admitting: Physician Assistant

## 2014-09-24 NOTE — Telephone Encounter (Signed)
I was able to call the company and get prior authorization for the medication. It is good for a year.   Authorization number: [id number]

## 2014-09-24 NOTE — Telephone Encounter (Signed)
Patient called because she is out of [place], and the pharmacy told her they did not have an authorization from her insurance company so she would have to pay out of pocket.  The prior authorization forms have been filled out and there are several phone notes regarding this, but the pharmacy states they have not received this from the insurance company.  Pharmacy states the only way she can get the medication is divided out of pocket.  The patient states she cannot afford the medication because her disability check will not be here until Monday.  Advised her I would research options and call her back if I came up with anything. Otherwise, we will address this on Monday.

## 2014-09-26 ENCOUNTER — Telehealth: Payer: Self-pay | Admitting: Cardiology

## 2014-09-26 NOTE — Telephone Encounter (Signed)
Pt would like some samples of Entresto please. °

## 2014-09-26 NOTE — Telephone Encounter (Signed)
Spoke with pt, aware we do not have any samples at this location. $10 co-pay card placed in the mail to the patient. Will forward to the [address] office to see if they have samples.

## 2014-10-06 LAB — BASIC METABOLIC PANEL
BUN: 69 mg/dL — ABNORMAL HIGH (ref 6–23)
CALCIUM: 9.6 mg/dL (ref 8.4–10.5)
CO2: 21 mEq/L (ref 19–32)
Chloride: 105 mEq/L (ref 96–112)
Creat: 3.13 mg/dL — ABNORMAL HIGH (ref 0.50–1.10)
Glucose, Bld: 108 mg/dL — ABNORMAL HIGH (ref 70–99)
Potassium: 4.9 mEq/L (ref 3.5–5.3)
SODIUM: 136 meq/L (ref 135–145)

## 2014-10-10 ENCOUNTER — Other Ambulatory Visit: Payer: Self-pay | Admitting: *Deleted

## 2014-10-10 MED ORDER — FUROSEMIDE 40 MG PO TABS: 20.0000 mg | ORAL_TABLET | Freq: Every day | ORAL | Status: DC

## 2014-10-14 ENCOUNTER — Ambulatory Visit (INDEPENDENT_AMBULATORY_CARE_PROVIDER_SITE_OTHER): Payer: Managed Care, Other (non HMO) | Admitting: Cardiology

## 2014-10-14 ENCOUNTER — Encounter: Payer: Self-pay | Admitting: Cardiology

## 2014-10-14 VITALS — BP 124/90 | HR 78 | Ht 67.0 in | Wt 201.0 lb

## 2014-10-14 DIAGNOSIS — E878 Other disorders of electrolyte and fluid balance, not elsewhere classified: Secondary | ICD-10-CM

## 2014-10-14 DIAGNOSIS — G4700 Insomnia, unspecified: Secondary | ICD-10-CM | POA: Insufficient documentation

## 2014-10-14 LAB — BASIC METABOLIC PANEL
BUN: 66 mg/dL — ABNORMAL HIGH (ref 6–23)
CHLORIDE: 109 meq/L (ref 96–112)
CO2: 19 mEq/L (ref 19–32)
CREATININE: 2.97 mg/dL — AB (ref 0.50–1.10)
Calcium: 9.2 mg/dL (ref 8.4–10.5)
Glucose, Bld: 103 mg/dL — ABNORMAL HIGH (ref 70–99)
POTASSIUM: 5.1 meq/L (ref 3.5–5.3)
Sodium: 139 mEq/L (ref 135–145)

## 2014-10-14 MED ORDER — ZOLPIDEM TARTRATE 5 MG PO TABS: 5.0000 mg | ORAL_TABLET | Freq: Every evening | ORAL | Status: DC | PRN

## 2014-10-14 NOTE — Progress Notes (Signed)
HPI The patient has not been back in follow up of HTN and cardiomyopathy  She was in the hospital in October with volume overload and hypertension. She has been somewhat not only in because she does not tolerate clonidine. Her ejection fraction was found to be 25%. She was started on sacubitril/valsartan.  However, blood work couple of days ago her creatinine is elevated above her baseline. I had her reduce her Lasix. She's back today.  She reports that she's been feeling well.  She denies any palpitations, presyncope or syncope. He thinks her breathing is fine. She's not describing PND or orthopnea. She is wearing a life vest and is scheduled for an echo next month. Try to watch her salt. She is weighing herself daily. Her weights have been fairly steady though a couple of pounds since her hospitalization.  Allergies  Allergen Reactions  . Latex Itching    Current Outpatient Prescriptions  Medication Sig Dispense Refill  . acetaminophen (TYLENOL) 325 MG tablet Take 2 tablets (650 mg total) by mouth every 4 (four) hours as needed for headache or mild pain.    Marland Kitchen amLODipine (NORVASC) 10 MG tablet Take 1 tablet (10 mg total) by mouth daily. 30 tablet 11  . carvedilol (COREG) 12.5 MG tablet Take 1 tablet (12.5 mg total) by mouth 2 (two) times daily with a meal. 60 tablet 11  . furosemide (LASIX) 40 MG tablet Take 0.5 tablets (20 mg total) by mouth daily. 30 tablet 11  . Sacubitril-Valsartan (ENTRESTO) 49-51 MG TABS Take 1 tablet by mouth 2 (two) times daily at 8 am and 10 pm. 28 tablet 0  . spironolactone (ALDACTONE) 50 MG tablet Take 1 tablet (50 mg total) by mouth daily. 30 tablet 6  . amLODipine (NORVASC) 10 MG tablet Take 1 tablet (10 mg total) by mouth daily. 30 tablet 2   No current facility-administered medications for this visit.    Past Medical History  Diagnosis Date  . Overweight(278.02)   . 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 Hypertension   .  Sciatica     Past Surgical History  Procedure Laterality Date  . Abdominal hysterectomy      ROS:  As stated in the HPI and negative for all other systems.  PHYSICAL EXAM BP 124/90 mmHg  Pulse 78  Ht 5\' 7"  (1.702 m)  Wt 201 lb (91.173 kg)  BMI 31.47 kg/m2 GENERAL:  Well appearing NECK:  No jugular venous distention, waveform within normal limits, carotid upstroke brisk and symmetric, no bruits, no thyromegaly LYMPHATICS:  No cervical, inguinal adenopathy LUNGS:  Clear to auscultation bilaterally BACK:  No CVA tenderness CHEST:  Unremarkable HEART:  PMI not displaced or sustained,S1 and S2 within normal limits, no S3, no S4, no clicks, no rubs, no murmurs ABD:  Flat, positive bowel sounds normal in frequency in pitch, no bruits, no rebound, no guarding, no midline pulsatile mass, no hepatomegaly, no splenomegaly EXT:  2 plus pulses throughout, no edema, no cyanosis no clubbing    ASSESSMENT AND PLAN  NONISCHEMIC CARDIOMYOPATHY:  Her ejection fraction was reduced. I'm going to titrate her carvedilol increasing to 18.75 mg twice daily. In the future pending her renal function I will try to titrate her sacubitril/valsartan.  I will be checking a basic metabolic profile today and might need to stop her Lasix if her creatinine is still elevated. We will need to watch this very closely. She will be referred for an  ICD if her EF remains below 35%.   HTN:  Her blood pressure is controlled.  This is being managed in the context of treating his CHF.  CKD:  As above.   INSOMNIA:  She complains about this I've given her a one-month prescription for Ambien which is to otherwise be filled by her primary provider if he/she agrees.

## 2014-10-14 NOTE — Patient Instructions (Signed)
Your physician recommends that you schedule a follow-up appointment in: after you Echo  Increase your coreg to 18.75 mg Two times a day  We have labs for you to get done

## 2014-10-21 ENCOUNTER — Other Ambulatory Visit: Payer: Self-pay | Admitting: Internal Medicine

## 2014-10-21 DIAGNOSIS — Z1231 Encounter for screening mammogram for malignant neoplasm of breast: Secondary | ICD-10-CM

## 2014-11-08 ENCOUNTER — Ambulatory Visit
Admission: RE | Admit: 2014-11-08 | Discharge: 2014-11-08 | Disposition: A | Discharge status: Home / Self Care | Payer: Managed Care, Other (non HMO) | Source: Ambulatory Visit | Attending: Internal Medicine | Admitting: Internal Medicine

## 2014-11-08 DIAGNOSIS — Z1231 Encounter for screening mammogram for malignant neoplasm of breast: Secondary | ICD-10-CM

## 2014-11-09 ENCOUNTER — Other Ambulatory Visit: Payer: Self-pay | Admitting: Internal Medicine

## 2014-11-09 DIAGNOSIS — R928 Other abnormal and inconclusive findings on diagnostic imaging of breast: Secondary | ICD-10-CM

## 2014-11-22 ENCOUNTER — Ambulatory Visit (HOSPITAL_COMMUNITY)
Admission: RE | Admit: 2014-11-22 | Discharge: 2014-11-22 | Disposition: A | Discharge status: Home / Self Care | Payer: Managed Care, Other (non HMO) | Source: Ambulatory Visit | Attending: Cardiology | Admitting: Cardiology

## 2014-11-22 DIAGNOSIS — I519 Heart disease, unspecified: Secondary | ICD-10-CM

## 2014-11-22 DIAGNOSIS — I429 Cardiomyopathy, unspecified: Secondary | ICD-10-CM | POA: Diagnosis not present

## 2014-11-22 NOTE — Progress Notes (Signed)
2D Echo Performed 11/22/2014    Marygrace Drought, RCS

## 2014-11-25 ENCOUNTER — Other Ambulatory Visit: Payer: Managed Care, Other (non HMO)

## 2014-11-28 ENCOUNTER — Ambulatory Visit (INDEPENDENT_AMBULATORY_CARE_PROVIDER_SITE_OTHER): Payer: Managed Care, Other (non HMO) | Admitting: Cardiology

## 2014-11-28 ENCOUNTER — Encounter: Payer: Self-pay | Admitting: Cardiology

## 2014-11-28 ENCOUNTER — Encounter: Payer: Self-pay | Admitting: *Deleted

## 2014-11-28 VITALS — BP 110/80 | HR 72 | Ht 64.75 in | Wt 203.5 lb

## 2014-11-28 DIAGNOSIS — E878 Other disorders of electrolyte and fluid balance, not elsewhere classified: Secondary | ICD-10-CM

## 2014-11-28 LAB — BASIC METABOLIC PANEL
BUN: 35 mg/dL — AB (ref 6–23)
CALCIUM: 9.8 mg/dL (ref 8.4–10.5)
CHLORIDE: 103 meq/L (ref 96–112)
CO2: 24 mEq/L (ref 19–32)
Creat: 2.24 mg/dL — ABNORMAL HIGH (ref 0.50–1.10)
Glucose, Bld: 85 mg/dL (ref 70–99)
POTASSIUM: 5.6 meq/L — AB (ref 3.5–5.3)
Sodium: 136 mEq/L (ref 135–145)

## 2014-11-28 MED ORDER — CARVEDILOL 25 MG PO TABS: 25.0000 mg | ORAL_TABLET | Freq: Two times a day (BID) | ORAL | Status: DC

## 2014-11-28 NOTE — Progress Notes (Signed)
   HPI The patient has not been back in follow up of HTN and cardiomyopathy  She was in the hospital in October with volume overload and hypertension.  Her ejection fraction was found to be 25%. She was started on sacubitril/valsartan.  She did wear a Life Vest.  An follow up echo last week demonstrated that her EF is now up to 45%.  She denies any palpitations, presyncope or syncope. He thinks her breathing is fine. She's trying to watch her salt. She is weighing herself daily. Her weights have been fairly steady.  She is getting some exercise.  Allergies  Allergen Reactions  . Latex Itching    Current Outpatient Prescriptions  Medication Sig Dispense Refill  . acetaminophen (TYLENOL) 325 MG tablet Take 2 tablets (650 mg total) by mouth every 4 (four) hours as needed for headache or mild pain.    Marland Kitchen amLODipine (NORVASC) 10 MG tablet Take 1 tablet (10 mg total) by mouth daily. 30 tablet 11  . carvedilol (COREG) 12.5 MG tablet Take 18.75 mg by mouth 2 (two) times daily with a meal.     . furosemide (LASIX) 40 MG tablet Take 0.5 tablets (20 mg total) by mouth daily. (Patient taking differently: Take 20 mg by mouth as needed. ) 30 tablet 11  . Sacubitril-Valsartan (ENTRESTO) 49-51 MG TABS Take 1 tablet by mouth 2 (two) times daily at 8 am and 10 pm. 28 tablet 0  . spironolactone (ALDACTONE) 50 MG tablet Take 1 tablet (50 mg total) by mouth daily. 30 tablet 6  . zolpidem (AMBIEN) 5 MG tablet Take 1 tablet (5 mg total) by mouth at bedtime as needed for sleep. 30 tablet 0   No current facility-administered medications for this visit.    Past Medical History  Diagnosis Date  . Overweight(278.02)   . 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 Hypertension   . Sciatica     Past Surgical History  Procedure Laterality Date  . Abdominal hysterectomy      ROS:  As stated in the HPI and negative for all other systems.  PHYSICAL EXAM BP 110/80 mmHg  Pulse 72  Ht  5' 4.75" (1.645 m)  Wt 203 lb 8 oz (92.307 kg)  BMI 34.11 kg/m2 GENERAL:  Well appearing NECK:  No jugular venous distention, waveform within normal limits, carotid upstroke brisk and symmetric, no bruits, no thyromegaly LYMPHATICS:  No cervical, inguinal adenopathy LUNGS:  Clear to auscultation bilaterally BACK:  No CVA tenderness CHEST:  Unremarkable HEART:  PMI not displaced or sustained,S1 and S2 within normal limits, no S3, no S4, no clicks, no rubs, no murmurs ABD:  Flat, positive bowel sounds normal in frequency in pitch, no bruits, no rebound, no guarding, no midline pulsatile mass, no hepatomegaly, no splenomegaly EXT:  2 plus pulses throughout, no edema, no cyanosis no clubbing   ASSESSMENT AND PLAN  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. I'm going to titrate her carvedilol increasing to 25 mg twice daily. In the future pending her renal function I will try to titrate her sacubitril/valsartan. I will slowly reduce the Norvasc.  Her LifeVest is now off.  HTN:  Her blood pressure is controlled.  This is being managed in the context of treating his CHF.  CKD:   I will check a BMET.  INSOMNIA:  This is improved.  No further work up is planned.

## 2014-11-28 NOTE — Patient Instructions (Signed)
Your physician recommends that you schedule a follow-up appointment in: 2 months with Dr. Percival Spanish  We are ordering labs for you  get done  We have increased your carvedilol to 25 mg Two times a day

## 2014-11-29 ENCOUNTER — Ambulatory Visit
Admission: RE | Admit: 2014-11-29 | Discharge: 2014-11-29 | Disposition: A | Discharge status: Home / Self Care | Payer: Managed Care, Other (non HMO) | Source: Ambulatory Visit | Attending: Internal Medicine | Admitting: Internal Medicine

## 2014-11-29 DIAGNOSIS — R928 Other abnormal and inconclusive findings on diagnostic imaging of breast: Secondary | ICD-10-CM

## 2014-12-01 ENCOUNTER — Other Ambulatory Visit (INDEPENDENT_AMBULATORY_CARE_PROVIDER_SITE_OTHER): Payer: Self-pay

## 2014-12-01 DIAGNOSIS — D242 Benign neoplasm of left breast: Principal | ICD-10-CM

## 2014-12-01 DIAGNOSIS — D241 Benign neoplasm of right breast: Secondary | ICD-10-CM

## 2014-12-05 ENCOUNTER — Other Ambulatory Visit: Payer: Managed Care, Other (non HMO)

## 2014-12-30 ENCOUNTER — Other Ambulatory Visit: Payer: Self-pay | Admitting: *Deleted

## 2014-12-30 ENCOUNTER — Other Ambulatory Visit: Payer: Self-pay

## 2014-12-30 ENCOUNTER — Other Ambulatory Visit: Payer: Self-pay | Admitting: Cardiology

## 2014-12-30 MED ORDER — SPIRONOLACTONE 50 MG PO TABS: 50.0000 mg | ORAL_TABLET | Freq: Every day | ORAL | Status: DC

## 2014-12-30 MED ORDER — SACUBITRIL-VALSARTAN 49-51 MG PO TABS: 1.0000 | ORAL_TABLET | Freq: Two times a day (BID) | ORAL | Status: DC

## 2014-12-30 MED ORDER — CARVEDILOL 25 MG PO TABS: 25.0000 mg | ORAL_TABLET | Freq: Two times a day (BID) | ORAL | Status: DC

## 2014-12-30 NOTE — Telephone Encounter (Signed)
Rx(s) sent to pharmacy electronically.  

## 2014-12-30 NOTE — Telephone Encounter (Signed)
Medication samples have been provided to the patient.  Drug name: [name] 49/51  Qty: 28 tabs LOT: [id number] Exp.Date: 12/2015  Samples left at front desk for patient pick-up. Patient notified.  Donivan Scull 4:38 PM 12/30/2014

## 2014-12-30 NOTE — Telephone Encounter (Signed)
Pt would like some samples of Entresto please.

## 2015-01-02 ENCOUNTER — Other Ambulatory Visit: Payer: Self-pay

## 2015-01-02 MED ORDER — AMLODIPINE BESYLATE 10 MG PO TABS: 10.0000 mg | ORAL_TABLET | Freq: Every day | ORAL | Status: DC

## 2015-01-02 NOTE — Telephone Encounter (Signed)
Rx(s) sent to pharmacy electronically.  

## 2015-01-06 ENCOUNTER — Ambulatory Visit (AMBULATORY_SURGERY_CENTER): Payer: Self-pay

## 2015-01-06 VITALS — Ht 65.0 in | Wt 208.0 lb

## 2015-01-06 DIAGNOSIS — Z1211 Encounter for screening for malignant neoplasm of colon: Secondary | ICD-10-CM

## 2015-01-06 MED ORDER — MOVIPREP 100 G PO SOLR
1.0000 | Freq: Once | ORAL | Status: DC
Start: 2015-01-06 — End: 2015-01-19

## 2015-01-06 NOTE — Progress Notes (Signed)
No allergies to eggs or soy No diet/weight loss meds No home oxygen No past problems with anesthesia  Has email  Emmi instructions given for colonoscopy 

## 2015-01-12 ENCOUNTER — Other Ambulatory Visit: Payer: Self-pay | Admitting: *Deleted

## 2015-01-12 NOTE — Telephone Encounter (Signed)
Rx denied to patient pharmacy. Per Dr.Hochrein, all additional refills should come from PCP

## 2015-01-19 ENCOUNTER — Encounter: Payer: Self-pay | Admitting: Internal Medicine

## 2015-01-19 ENCOUNTER — Ambulatory Visit (AMBULATORY_SURGERY_CENTER): Payer: Managed Care, Other (non HMO) | Admitting: Internal Medicine

## 2015-01-19 VITALS — BP 127/86 | HR 89 | Temp 96.4°F | Resp 26 | Ht 65.0 in | Wt 208.0 lb

## 2015-01-19 DIAGNOSIS — Z1211 Encounter for screening for malignant neoplasm of colon: Secondary | ICD-10-CM

## 2015-01-19 MED ORDER — SODIUM CHLORIDE 0.9 % IV SOLN: 500.0000 mL | INTRAVENOUS | Status: DC

## 2015-01-19 NOTE — Op Note (Signed)
[place]  [address] [place], [id number]   COLONOSCOPY PROCEDURE REPORT  PATIENT: [name], [name]  MR#: [id number] BIRTHDATE: 05/14/1961 , 60  yrs. old GENDER: female ENDOSCOPIST: Eustace Quail, MD REFERRED JJ:OACZY Ardeth Perfect, M.D. PROCEDURE DATE:  01/19/2015 PROCEDURE:   Colonoscopy, screening First Screening Colonoscopy - Avg.  risk and is 50 yrs.  old or older Yes.  Prior Negative Screening - Now for repeat screening. N/A  History of Adenoma - Now for follow-up colonoscopy & has been > or = to 3 yrs.  N/A ASA CLASS:   Class II INDICATIONS:Screening for colonic neoplasia and Colorectal Neoplasm Risk Assessment for this procedure is average risk. MEDICATIONS: Monitored anesthesia care and Propofol 250 mg IV  DESCRIPTION OF PROCEDURE:   After the risks benefits and alternatives of the procedure were thoroughly explained, informed consent was obtained.  The digital rectal exam revealed no abnormalities of the rectum.   The LB [id number]  endoscope was introduced through the anus and advanced to the cecum, which was identified by both the appendix and ileocecal valve. No adverse events experienced.   The quality of the prep was excellent. (MoviPrep was used)  The instrument was then slowly withdrawn as the colon was fully examined.      COLON FINDINGS: There was mild diverticulosis noted throughout the entire examined colon.   The examination was otherwise normal. Retroflexed views revealed no abnormalities. The time to cecum = 1.4 Withdrawal time = 10.6   The scope was withdrawn and the procedure completed. COMPLICATIONS: There were no immediate complications.  ENDOSCOPIC IMPRESSION: 1.   Mild diverticulosis was noted throughout the entire examined colon 2.   The examination was otherwise normal  RECOMMENDATIONS: Continue current colorectal screening recommendations for "routine risk" patients with a repeat colonoscopy in 10 years.  eSigned:   Eustace Quail, MD 01/19/2015 8:34 AM   cc: The Patient   ; Ermalinda Memos

## 2015-01-19 NOTE — Progress Notes (Signed)
No egg or soy allergy No issues with past sedation

## 2015-01-19 NOTE — Patient Instructions (Signed)
YOU HAD AN ENDOSCOPIC PROCEDURE TODAY AT [facility]:   Refer to the procedure report that was given to you for any specific questions about what was found during the examination.  If the procedure report does not answer your questions, please call your gastroenterologist to clarify.  If you requested that your care partner not be given the details of your procedure findings, then the procedure report has been included in a sealed envelope for you to review at your convenience later.  YOU SHOULD EXPECT: Some feelings of bloating in the abdomen. Passage of more gas than usual.  Walking can help get rid of the air that was put into your GI tract during the procedure and reduce the bloating. If you had a lower endoscopy (such as a colonoscopy or flexible sigmoidoscopy) you may notice spotting of blood in your stool or on the toilet paper. If you underwent a bowel prep for your procedure, you may not have a normal bowel movement for a few days.  Please Note:  You might notice some irritation and congestion in your nose or some drainage.  This is from the oxygen used during your procedure.  There is no need for concern and it should clear up in a day or so.  SYMPTOMS TO REPORT IMMEDIATELY:   Following lower endoscopy (colonoscopy or flexible sigmoidoscopy):  Excessive amounts of blood in the stool  Significant tenderness or worsening of abdominal pains  Swelling of the abdomen that is new, acute  Fever of 100F or higher  For urgent or emergent issues, a gastroenterologist can be reached at any hour by calling [phone].   DIET: Your first meal following the procedure should be a small meal and then it is ok to progress to your normal diet. Heavy or fried foods are harder to digest and may make you feel nauseous or bloated.  Likewise, meals heavy in dairy and vegetables can increase bloating.  Drink plenty of fluids but you should avoid alcoholic beverages for 24  hours.  ACTIVITY:  You should plan to take it easy for the rest of today and you should NOT DRIVE or use heavy machinery until tomorrow (because of the sedation medicines used during the test).    FOLLOW UP: Our staff will call the number listed on your records the next business day following your procedure to check on you and address any questions or concerns that you may have regarding the information given to you following your procedure. If we do not reach you, we will leave a message.  However, if you are feeling well and you are not experiencing any problems, there is no need to return our call.  We will assume that you have returned to your regular daily activities without incident.  If any biopsies were taken you will be contacted by phone or by letter within the next 1-3 weeks.  Please call us at [phone] if you have not heard about the biopsies in 3 weeks.    SIGNATURES/CONFIDENTIALITY: You and/or your care partner have signed paperwork which will be entered into your electronic medical record.  These signatures attest to the fact that that the information above on your After Visit Summary has been reviewed and is understood.  Full responsibility of the confidentiality of this discharge information lies with you and/or your care-partner.  Recommendations Discharge instructions given to patient and/or care partner. Diverticulosis and high fiber diet handouts provided.  Next colonoscopy in 10 years.

## 2015-01-19 NOTE — Progress Notes (Signed)
A/ox3 pleased with MAC, report to Wendy RN 

## 2015-01-20 ENCOUNTER — Telehealth: Payer: Self-pay | Admitting: *Deleted

## 2015-01-20 NOTE — Telephone Encounter (Signed)
  Follow up Call-  Call back number 01/19/2015  Post procedure Call Back phone  # [phone]  Permission to leave phone message Yes     Patient questions:  Do you have a fever, pain , or abdominal swelling? No. Pain Score  0 *  Have you tolerated food without any problems? Yes.    Have you been able to return to your normal activities? Yes.    Do you have any questions about your discharge instructions: Diet   No. Medications  No. Follow up visit  No.  Do you have questions or concerns about your Care? No.  Actions: * If pain score is 4 or above: No action needed, pain <4.

## 2015-01-30 ENCOUNTER — Ambulatory Visit (INDEPENDENT_AMBULATORY_CARE_PROVIDER_SITE_OTHER): Payer: Managed Care, Other (non HMO) | Admitting: Cardiology

## 2015-01-30 ENCOUNTER — Encounter: Payer: Self-pay | Admitting: Cardiology

## 2015-01-30 VITALS — BP 112/84 | HR 86 | Ht 65.0 in | Wt 205.2 lb

## 2015-01-30 DIAGNOSIS — I5022 Chronic systolic (congestive) heart failure: Secondary | ICD-10-CM | POA: Diagnosis not present

## 2015-01-30 DIAGNOSIS — I10 Essential (primary) hypertension: Secondary | ICD-10-CM

## 2015-01-30 LAB — BASIC METABOLIC PANEL WITH GFR
BUN: 28 mg/dL — ABNORMAL HIGH (ref 6–23)
CO2: 22 mEq/L (ref 19–32)
Calcium: 9.2 mg/dL (ref 8.4–10.5)
Chloride: 107 mEq/L (ref 96–112)
Creat: 1.94 mg/dL — ABNORMAL HIGH (ref 0.50–1.10)
GFR, Est African American: 34 mL/min — ABNORMAL LOW
GFR, Est Non African American: 29 mL/min — ABNORMAL LOW
Glucose, Bld: 90 mg/dL (ref 70–99)
Potassium: 5.2 mEq/L (ref 3.5–5.3)
Sodium: 140 mEq/L (ref 135–145)

## 2015-01-30 MED ORDER — AMLODIPINE BESYLATE 5 MG PO TABS: 5.0000 mg | ORAL_TABLET | Freq: Every day | ORAL | Status: DC

## 2015-01-30 NOTE — Patient Instructions (Signed)
Your physician wants you to follow-up in: [place] will receive a reminder letter in the mail two months in advance. If you don't receive a letter, please call our office to schedule the follow-up appointment.   DECREASE AMLODIPINE TO 5 MG ONCE DAILY= 1/2 OF THE 10 MG TABLET ONCE DAILY  Your physician recommends that you HAVE LAB WORK TODAY

## 2015-01-30 NOTE — Progress Notes (Signed)
HPI The patient has not been back in follow up of HTN and cardiomyopathy  She was in the hospital in October with volume overload and hypertension.  Her ejection fraction was found to be 25%. She was started on sacubitril/valsartan.  She did wear a Life Vest.  An follow up echo last week demonstrated that her EF is now up to 45%.  She denies any palpitations, presyncope or syncope. She thinks her breathing is fine. She's trying to watch her salt. She is weighing herself daily. Her weights have been fairly steady.  She is getting some exercise but not enough.  At the last visit she tolerated increased beta blocker. .  Allergies  Allergen Reactions  . Latex Itching    Current Outpatient Prescriptions  Medication Sig Dispense Refill  . acetaminophen (TYLENOL) 325 MG tablet Take 2 tablets (650 mg total) by mouth every 4 (four) hours as needed for headache or mild pain.    Marland Kitchen amLODipine (NORVASC) 10 MG tablet Take 1 tablet (10 mg total) by mouth daily. 90 tablet 3  . atorvastatin (LIPITOR) 20 MG tablet   5  . carvedilol (COREG) 25 MG tablet Take 1 tablet (25 mg total) by mouth 2 (two) times daily with a meal. 180 tablet 1  . furosemide (LASIX) 40 MG tablet Take 0.5 tablets (20 mg total) by mouth daily. (Patient taking differently: Take 20 mg by mouth as needed. ) 30 tablet 11  . sacubitril-valsartan (ENTRESTO) 49-51 MG Take 1 tablet by mouth 2 (two) times daily. 180 tablet 3  . spironolactone (ALDACTONE) 50 MG tablet Take 1 tablet (50 mg total) by mouth daily. 90 tablet 1  . zolpidem (AMBIEN) 5 MG tablet Take 1 tablet (5 mg total) by mouth at bedtime as needed for sleep. 30 tablet 0   No current facility-administered medications for this visit.    Past Medical History  Diagnosis Date  . Overweight(278.02)   . 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 Hypertension   . Sciatica   . CHF (congestive heart failure)     Past Surgical History  Procedure  Laterality Date  . Abdominal hysterectomy      ROS:  As stated in the HPI and negative for all other systems.  PHYSICAL EXAM BP 112/84 mmHg  Pulse 86  Ht 5\' 5"  (1.651 m)  Wt 205 lb 3.2 oz (93.078 kg)  BMI 34.15 kg/m2 GENERAL:  Well appearing NECK:  No jugular venous distention, waveform within normal limits, carotid upstroke brisk and symmetric, no bruits, no thyromegaly LYMPHATICS:  No cervical, inguinal adenopathy LUNGS:  Clear to auscultation bilaterally BACK:  No CVA tenderness CHEST:  Unremarkable HEART:  PMI not displaced or sustained,S1 and S2 within normal limits, no S3, no S4, no clicks, no rubs, no murmurs ABD:  Flat, positive bowel sounds normal in frequency in pitch, no bruits, no rebound, no guarding, no midline pulsatile mass, no hepatomegaly, no splenomegaly EXT:  2 plus pulses throughout, no edema, no cyanosis no clubbing   ASSESSMENT AND PLAN  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 I'm going to titrate her carvedilol increasing to 25 mg twice daily. In the future pending her renal function I will try to titrate her sacubitril/valsartan but not at this visit. I will reduce the Norvasc to 5 mg.    HTN:  Her blood pressure is controlled.  This is being managed in the context of treating his CHF.  CKD:  I will check a BMET again today.  Her potassium was slightly elevated at the last reading.   INSOMNIA:  This is about the same.  No further work up is planned.

## 2015-02-01 ENCOUNTER — Encounter: Payer: Self-pay | Admitting: *Deleted

## 2015-04-11 ENCOUNTER — Other Ambulatory Visit: Payer: Self-pay | Admitting: Internal Medicine

## 2015-04-11 DIAGNOSIS — D249 Benign neoplasm of unspecified breast: Secondary | ICD-10-CM

## 2015-04-19 ENCOUNTER — Telehealth: Payer: Self-pay | Admitting: Cardiology

## 2015-04-19 MED ORDER — SACUBITRIL-VALSARTAN 49-51 MG PO TABS: 1.0000 | ORAL_TABLET | Freq: Two times a day (BID) | ORAL | Status: DC

## 2015-04-19 NOTE — Telephone Encounter (Signed)
Spoke to patient  Informed patient, 3 weeks of samples available to pick up. RN asked patient if saving card was helpful or had she received assist information. Patient states she could only use card once and she did at the start of the medications. She states her co-pay is $100 dollars. RN informed patient when she comes and picks up samples - information will be given to her that may be  able to off set some of the cost. She voiced understanding.

## 2015-04-19 NOTE — Telephone Encounter (Signed)
Pt is calling in to see if there are any samples of Entresto 49/51mg . Please call  Thanks

## 2015-04-20 NOTE — Telephone Encounter (Signed)
Can this encounter be closed?

## 2015-04-26 ENCOUNTER — Other Ambulatory Visit: Payer: Self-pay | Admitting: General Surgery

## 2015-04-26 ENCOUNTER — Other Ambulatory Visit: Payer: Self-pay | Admitting: Internal Medicine

## 2015-04-26 DIAGNOSIS — D249 Benign neoplasm of unspecified breast: Secondary | ICD-10-CM

## 2015-05-30 ENCOUNTER — Other Ambulatory Visit: Payer: Managed Care, Other (non HMO)

## 2015-05-30 ENCOUNTER — Inpatient Hospital Stay: Admission: RE | Admit: 2015-05-30 | Payer: Managed Care, Other (non HMO) | Source: Ambulatory Visit

## 2015-06-12 ENCOUNTER — Ambulatory Visit: Payer: Managed Care, Other (non HMO) | Admitting: Cardiology

## 2015-06-21 ENCOUNTER — Encounter: Payer: Self-pay | Admitting: Cardiology

## 2015-08-10 ENCOUNTER — Emergency Department (HOSPITAL_BASED_OUTPATIENT_CLINIC_OR_DEPARTMENT_OTHER)
Admission: EM | Admit: 2015-08-10 | Discharge: 2015-08-10 | Disposition: A | Discharge status: Home / Self Care | Payer: Managed Care, Other (non HMO) | Attending: Emergency Medicine | Admitting: Emergency Medicine

## 2015-08-10 ENCOUNTER — Encounter (HOSPITAL_BASED_OUTPATIENT_CLINIC_OR_DEPARTMENT_OTHER): Payer: Self-pay | Admitting: Emergency Medicine

## 2015-08-10 DIAGNOSIS — Z91040 Latex allergy status: Secondary | ICD-10-CM | POA: Diagnosis not present

## 2015-08-10 DIAGNOSIS — E663 Overweight: Secondary | ICD-10-CM | POA: Diagnosis not present

## 2015-08-10 DIAGNOSIS — I509 Heart failure, unspecified: Secondary | ICD-10-CM | POA: Diagnosis not present

## 2015-08-10 DIAGNOSIS — I10 Essential (primary) hypertension: Secondary | ICD-10-CM | POA: Diagnosis not present

## 2015-08-10 DIAGNOSIS — Z79899 Other long term (current) drug therapy: Secondary | ICD-10-CM | POA: Diagnosis not present

## 2015-08-10 DIAGNOSIS — M542 Cervicalgia: Secondary | ICD-10-CM | POA: Diagnosis not present

## 2015-08-10 MED ORDER — DIAZEPAM 5 MG PO TABS: 5.0000 mg | ORAL_TABLET | Freq: Four times a day (QID) | ORAL | Status: DC | PRN

## 2015-08-10 MED ORDER — DIAZEPAM 5 MG PO TABS
5.0000 mg | ORAL_TABLET | Freq: Once | ORAL | Status: AC
  Administered 2015-08-10: 5 mg via ORAL
  Filled 2015-08-10: qty 1

## 2015-08-10 MED ORDER — KETOROLAC TROMETHAMINE 60 MG/2ML IM SOLN
60.0000 mg | Freq: Once | INTRAMUSCULAR | Status: AC
  Administered 2015-08-10: 60 mg via INTRAMUSCULAR
  Filled 2015-08-10: qty 2

## 2015-08-10 NOTE — ED Notes (Signed)
Awoke this am with left sided neck pain, when she got to work the left side of her neck started spasm

## 2015-08-10 NOTE — Discharge Instructions (Signed)
Ibuprofen 600 mg 3 times daily for the next 5 days.  Valium as prescribed as needed for neck pain and tightness.  Follow-up with your primary Dr. if not improving in the next week.   Musculoskeletal Pain Musculoskeletal pain is muscle and boney aches and pains. These pains can occur in any part of the body. Your caregiver may treat you without knowing the cause of the pain. They may treat you if blood or urine tests, X-rays, and other tests were normal.  CAUSES There is often not a definite cause or reason for these pains. These pains may be caused by a type of germ (virus). The discomfort may also come from overuse. Overuse includes working out too hard when your body is not fit. Boney aches also come from weather changes. Bone is sensitive to atmospheric pressure changes. HOME CARE INSTRUCTIONS   Ask when your test results will be ready. Make sure you get your test results.  Only take over-the-counter or prescription medicines for pain, discomfort, or fever as directed by your caregiver. If you were given medications for your condition, do not drive, operate machinery or power tools, or sign legal documents for 24 hours. Do not drink alcohol. Do not take sleeping pills or other medications that may interfere with treatment.  Continue all activities unless the activities cause more pain. When the pain lessens, slowly resume normal activities. Gradually increase the intensity and duration of the activities or exercise.  During periods of severe pain, bed rest may be helpful. Lay or sit in any position that is comfortable.  Putting ice on the injured area.  Put ice in a bag.  Place a towel between your skin and the bag.  Leave the ice on for 15 to 20 minutes, 3 to 4 times a day.  Follow up with your caregiver for continued problems and no reason can be found for the pain. If the pain becomes worse or does not go away, it may be necessary to repeat tests or do additional testing. Your  caregiver may need to look further for a possible cause. SEEK IMMEDIATE MEDICAL CARE IF:  You have pain that is getting worse and is not relieved by medications.  You develop chest pain that is associated with shortness or breath, sweating, feeling sick to your stomach (nauseous), or throw up (vomit).  Your pain becomes localized to the abdomen.  You develop any new symptoms that seem different or that concern you. MAKE SURE YOU:   Understand these instructions.  Will watch your condition.  Will get help right away if you are not doing well or get worse.   This information is not intended to replace advice given to you by your health care provider. Make sure you discuss any questions you have with your health care provider.   Document Released: 10/21/2005 Document Revised: 01/13/2012 Document Reviewed: 06/25/2013 Elsevier Interactive Patient Education [employer].

## 2015-08-10 NOTE — ED Provider Notes (Signed)
CSN: [id number]     Arrival date & time 08/10/15  4650 History   First MD Initiated Contact with Patient 08/10/15 0932     Chief Complaint  Patient presents with  . Neck Pain     (Consider location/radiation/quality/duration/timing/severity/associated sxs/prior Treatment) HPI Comments: Patient is a 54 year old female with history of hypertension and congestive heart failure. She presents for evaluation of severe left sided neck pain. When she woke this morning she reports a "crick in her neck". Shortly after arriving at work she developed severe pain. She denies any radiation into her arms or legs area she denies any bowel or bladder complaints. Her pain is worse with palpation or turning head.   Patient is a 54 y.o. female presenting with neck pain. The history is provided by the patient.  Neck Pain Pain location:  L side Quality:  Stiffness Pain radiates to:  Does not radiate Pain severity:  Severe Onset quality:  Sudden Duration:  3 days Timing:  Constant Progression:  Worsening Chronicity:  New Context: not fall and not lifting a heavy object   Relieved by:  Nothing Exacerbated by: Movement and palpation. Ineffective treatments:  None tried   Past Medical History  Diagnosis Date  . Overweight(278.02)   . 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 Hypertension   . Sciatica   . CHF (congestive heart failure) [facility])    Past Surgical History  Procedure Laterality Date  . Abdominal hysterectomy     Family History  Problem Relation Age of Onset  . Coronary artery disease    . Cerebral aneurysm Mother 26    ruptured  . Hypertension Mother     severe  . Heart attack Father 53  . Hypertension Brother     severe  . Colon cancer Neg Hx    Social History  Substance Use Topics  . Smoking status: Never Smoker   . Smokeless tobacco: Never Used  . Alcohol Use: 4.2 - 8.4 oz/week    7-14 Cans of beer per week     Comment: social-daily beers   OB History     No data available     Review of Systems  Musculoskeletal: Positive for neck pain.  All other systems reviewed and are negative.     Allergies  Latex  Home Medications   Prior to Admission medications   Medication Sig Start Date End Date Taking? Authorizing Provider  acetaminophen (TYLENOL) 325 MG tablet Take 2 tablets (650 mg total) by mouth every 4 (four) hours as needed for headache or mild pain. 08/09/14  Yes Luke K Kilroy, PA-C  amLODipine (NORVASC) 5 MG tablet Take 1 tablet (5 mg total) by mouth daily. 01/30/15  Yes Minus Breeding, MD  atorvastatin (LIPITOR) 20 MG tablet  12/30/14  Yes Historical Provider, MD  sacubitril-valsartan (ENTRESTO) 49-51 MG Take 1 tablet by mouth 2 (two) times daily. 12/30/14  Yes Minus Breeding, MD  sacubitril-valsartan (ENTRESTO) 49-51 MG Take 1 tablet by mouth 2 (two) times daily. 04/19/15  Yes Minus Breeding, MD  spironolactone (ALDACTONE) 50 MG tablet Take 1 tablet (50 mg total) by mouth daily. 12/30/14  Yes Minus Breeding, MD  carvedilol (COREG) 25 MG tablet Take 1 tablet (25 mg total) by mouth 2 (two) times daily with a meal. 12/30/14   Minus Breeding, MD   BP 179/116 mmHg  Pulse 86  Temp(Src) 98 F (36.7 C)  Resp 20  Ht 5\' 5"  (1.651 m)  Wt 212 lb (96.163  kg)  BMI 35.28 kg/m2  SpO2 100% Physical Exam  Constitutional: She is oriented to person, place, and time. She appears well-developed and well-nourished. No distress.  HENT:  Head: Normocephalic and atraumatic.  Neck:  There is exquisite tenderness to palpation over the left lateral neck in the region of the sternocleidomastoid. There is no posterior cervical tenderness. There is no obvious anatomic abnormality. There is no stridor and she is having no difficulty breathing.  Cardiovascular: Normal rate and regular rhythm.   No murmur heard. Pulmonary/Chest: Effort normal and breath sounds normal. No respiratory distress. She has no wheezes. She has no rales.  Musculoskeletal: Normal range  of motion. She exhibits no edema.  Neurological: She is alert and oriented to person, place, and time.  Strength is 5 out of 5 in the bilateral upper extremities. Ulnar and radial pulses are easily palpable bilaterally.  Skin: Skin is warm and dry. She is not diaphoretic.  Nursing note and vitals reviewed.   ED Course  Procedures (including critical care time) Labs Review Labs Reviewed - No data to display  Imaging Review No results found. I have personally reviewed and evaluated these images and lab results as part of my medical decision-making.   EKG Interpretation None      MDM   Final diagnoses:  None    Patient with neck pain/torticollis. There are no neurologic deficits. She is feeling better after Toradol and Valium. She will be discharged with NSAIDs and Valium and when necessary follow-up if not improving.    Veryl Speak, MD 08/11/15 [phone]

## 2015-08-11 ENCOUNTER — Encounter: Payer: Self-pay | Admitting: Cardiology

## 2015-08-11 ENCOUNTER — Ambulatory Visit (INDEPENDENT_AMBULATORY_CARE_PROVIDER_SITE_OTHER): Payer: Managed Care, Other (non HMO) | Admitting: Cardiology

## 2015-08-11 VITALS — BP 152/112 | HR 74 | Ht 65.75 in | Wt 215.4 lb

## 2015-08-11 DIAGNOSIS — E785 Hyperlipidemia, unspecified: Secondary | ICD-10-CM | POA: Diagnosis not present

## 2015-08-11 DIAGNOSIS — I5022 Chronic systolic (congestive) heart failure: Secondary | ICD-10-CM | POA: Diagnosis not present

## 2015-08-11 DIAGNOSIS — I10 Essential (primary) hypertension: Secondary | ICD-10-CM

## 2015-08-11 DIAGNOSIS — E669 Obesity, unspecified: Secondary | ICD-10-CM | POA: Diagnosis not present

## 2015-08-11 DIAGNOSIS — Z79899 Other long term (current) drug therapy: Secondary | ICD-10-CM

## 2015-08-11 LAB — LIPID PANEL
CHOLESTEROL: 211 mg/dL — AB (ref 125–200)
HDL: 66 mg/dL (ref >=46)
LDL CALC: 131 mg/dL — AB (ref <130)
TRIGLYCERIDES: 68 mg/dL (ref <150)
Total CHOL/HDL Ratio: 3.2 Ratio (ref <=5.0)
VLDL: 14 mg/dL (ref <30)

## 2015-08-11 LAB — CBC
HEMATOCRIT: 35.4 % — AB (ref 36.0–46.0)
HEMOGLOBIN: 12.1 g/dL (ref 12.0–15.0)
MCH: 30.3 pg (ref 26.0–34.0)
MCHC: 34.2 g/dL (ref 30.0–36.0)
MCV: 88.7 fL (ref 78.0–100.0)
MPV: 9.3 fL (ref 8.6–12.4)
Platelets: 272 10*3/uL (ref 150–400)
RBC: 3.99 MIL/uL (ref 3.87–5.11)
RDW: 13.9 % (ref 11.5–15.5)
WBC: 5 10*3/uL (ref 4.0–10.5)

## 2015-08-11 LAB — COMPREHENSIVE METABOLIC PANEL
ALBUMIN: 4.1 g/dL (ref 3.6–5.1)
ALT: 13 U/L (ref 6–29)
AST: 17 U/L (ref 10–35)
Alkaline Phosphatase: 100 U/L (ref 33–130)
BUN: 29 mg/dL — ABNORMAL HIGH (ref 7–25)
CALCIUM: 8.9 mg/dL (ref 8.6–10.4)
CHLORIDE: 105 mmol/L (ref 98–110)
CO2: 28 mmol/L (ref 20–31)
Creat: 1.53 mg/dL — ABNORMAL HIGH (ref 0.50–1.05)
Glucose, Bld: 110 mg/dL — ABNORMAL HIGH (ref 65–99)
POTASSIUM: 4 mmol/L (ref 3.5–5.3)
Sodium: 143 mmol/L (ref 135–146)
Total Bilirubin: 0.6 mg/dL (ref 0.2–1.2)
Total Protein: 7.3 g/dL (ref 6.1–8.1)

## 2015-08-11 MED ORDER — SACUBITRIL-VALSARTAN 49-51 MG PO TABS: 1.0000 | ORAL_TABLET | Freq: Two times a day (BID) | ORAL | Status: DC

## 2015-08-11 MED ORDER — SPIRONOLACTONE 50 MG PO TABS: 50.0000 mg | ORAL_TABLET | Freq: Every day | ORAL | Status: DC

## 2015-08-11 NOTE — Patient Instructions (Signed)
LABS CBC,LIPID,CBC  Your physician wants you to follow-up in Portal. You will receive a reminder letter in the mail two months in advance. If you don't receive a letter, please call our office to schedule the follow-up appointment.

## 2015-08-11 NOTE — Progress Notes (Signed)
HPI The patient has not been back in follow up of HTN and cardiomyopathy  Since I last saw her she has done well from a cardiac standpoint.  She was in the ED last week with neck muscle spasm.  On her last  echo  EF is now up to 45%.  She denies any palpitations, presyncope or syncope. She thinks her breathing is fine. She's watching her salt. She is walking most days and working part time.  Allergies  Allergen Reactions  . Latex Itching    Current Outpatient Prescriptions  Medication Sig Dispense Refill  . acetaminophen (TYLENOL) 325 MG tablet Take 2 tablets (650 mg total) by mouth every 4 (four) hours as needed for headache or mild pain.    Marland Kitchen amLODipine (NORVASC) 5 MG tablet Take 1 tablet (5 mg total) by mouth daily. 90 tablet 3  . atorvastatin (LIPITOR) 20 MG tablet   5  . BELSOMRA 10 MG TABS Take 1 tablet by mouth as needed.  0  . carvedilol (COREG) 25 MG tablet Take 1 tablet (25 mg total) by mouth 2 (two) times daily with a meal. 180 tablet 1  . diazepam (VALIUM) 5 MG tablet Take 1 tablet (5 mg total) by mouth every 6 (six) hours as needed for anxiety (spasms). 10 tablet 0  . sacubitril-valsartan (ENTRESTO) 49-51 MG Take 1 tablet by mouth 2 (two) times daily. 180 tablet 3  . sacubitril-valsartan (ENTRESTO) 49-51 MG Take 1 tablet by mouth 2 (two) times daily. 42 tablet 0  . spironolactone (ALDACTONE) 50 MG tablet Take 1 tablet (50 mg total) by mouth daily. 90 tablet 1   No current facility-administered medications for this visit.    Past Medical History  Diagnosis Date  . Overweight(278.02)   . 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 Hypertension   . Sciatica   . CHF (congestive heart failure) [facility])     Past Surgical History  Procedure Laterality Date  . Abdominal hysterectomy      ROS:  Insomnia.  As stated in the HPI and negative for all other systems.  PHYSICAL EXAM BP 152/112 mmHg  Pulse 74  Ht 5' 5.75" (1.67 m)  Wt 215 lb 6.4 oz (97.705 kg)   BMI 35.03 kg/m2 GENERAL:  Well appearing NECK:  No jugular venous distention, waveform within normal limits, carotid upstroke brisk and symmetric, no bruits, no thyromegaly LYMPHATICS:  No cervical, inguinal adenopathy LUNGS:  Clear to auscultation bilaterally BACK:  No CVA tenderness CHEST:  Unremarkable HEART:  PMI not displaced or sustained,S1 and S2 within normal limits, no S3, no S4, no clicks, no rubs, no murmurs ABD:  Flat, positive bowel sounds normal in frequency in pitch, no bruits, no rebound, no guarding, no midline pulsatile mass, no hepatomegaly, no splenomegaly EXT:  2 plus pulses throughout, no edema, no cyanosis no clubbing  EKG:   Sinus rhythm, rate 24, axis within normal limits, intervals within normal limits , no acute ST-T wave changes.  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 She will remain on the meds as listed.  I will check labs to include a CMET, CBC HTN:  Her blood pressure is controlled.  This is being managed in the context of treating his CHF.  CKD:   I will check a CMET again today.    DYSLIPIDEMIA:  I will check a lipid profile and liver enzymes.   HTN:   Her  pressure is elevated today. However, is usually very well controlled and I reviewed previous readings. She says it is elevated because of the neck pain she still having.

## 2015-08-14 ENCOUNTER — Telehealth: Payer: Self-pay | Admitting: *Deleted

## 2015-08-14 DIAGNOSIS — E785 Hyperlipidemia, unspecified: Secondary | ICD-10-CM

## 2015-08-14 MED ORDER — ATORVASTATIN CALCIUM 40 MG PO TABS: 40.0000 mg | ORAL_TABLET | Freq: Every day | ORAL | Status: DC

## 2015-08-14 NOTE — Telephone Encounter (Signed)
Spoke with pt, aware of medication change. Lab orders mailed to the pt  

## 2015-08-14 NOTE — Telephone Encounter (Signed)
-----   Message from Minus Breeding, MD sent at 08/13/2015  8:33 PM EDT ----- Increase Lipitor to 40 mg daily and repeat lipid profile and liver enzymes in 10 weeks.  Call Ms. Brafford with the results and send results to Velna Hatchet, MD

## 2015-10-10 ENCOUNTER — Telehealth: Payer: Self-pay | Admitting: Cardiology

## 2015-10-10 NOTE — Telephone Encounter (Signed)
Patient calling the office for samples of medication:   1.  What medication and dosage are you requesting samples for? Entresto  2.  Are you currently out of this medication? Just 1 pill left

## 2015-10-11 ENCOUNTER — Other Ambulatory Visit: Payer: Self-pay | Admitting: *Deleted

## 2015-10-11 MED ORDER — SACUBITRIL-VALSARTAN 49-51 MG PO TABS: 1.0000 | ORAL_TABLET | Freq: Two times a day (BID) | ORAL | Status: DC

## 2015-10-11 NOTE — Telephone Encounter (Signed)
Returned call.  Advised patient that I am leaving her some samples of Entresto up front and am sending a script to her pharmacy.

## 2015-10-13 ENCOUNTER — Telehealth: Payer: Self-pay

## 2015-10-13 NOTE — Telephone Encounter (Signed)
Entresto 49-51 approved through [facility]. Auth good for one year.

## 2015-10-13 NOTE — Telephone Encounter (Signed)
Entresto 49-51 approved

## 2015-10-25 ENCOUNTER — Other Ambulatory Visit: Payer: Self-pay | Admitting: Cardiology

## 2015-10-25 NOTE — Telephone Encounter (Signed)
Rx(s) sent to pharmacy electronically.  

## 2016-02-04 ENCOUNTER — Other Ambulatory Visit: Payer: Self-pay | Admitting: Cardiology

## 2016-02-06 NOTE — Telephone Encounter (Signed)
Rx(s) sent to pharmacy electronically.  

## 2016-02-29 NOTE — Progress Notes (Signed)
HPI The patient has not been back in follow up of HTN and cardiomyopathy  Since I last saw her she has done well from a cardiac standpoint.  On her last  echo  EF is now up to 45%.  She denies any palpitations, presyncope or syncope. She thinks her breathing is fine. She's watching her salt. She is walking most days.    Allergies  Allergen Reactions  . Latex Itching    Current Outpatient Prescriptions  Medication Sig Dispense Refill  . acetaminophen (TYLENOL) 325 MG tablet Take 2 tablets (650 mg total) by mouth every 4 (four) hours as needed for headache or mild pain.    Marland Kitchen amLODipine (NORVASC) 5 MG tablet Take 1 tablet (5 mg total) by mouth daily. PLEASE KEEP UPCOMING APPOINTMENT 90 tablet 0  . atorvastatin (LIPITOR) 40 MG tablet Take 1 tablet (40 mg total) by mouth daily at 6 PM. 90 tablet 3  . BELSOMRA 10 MG TABS Take 1 tablet by mouth as needed.  0  . carvedilol (COREG) 25 MG tablet Take 1 tablet (25 mg total) by mouth 2 (two) times daily with a meal. 180 tablet 1  . diazepam (VALIUM) 5 MG tablet Take 1 tablet (5 mg total) by mouth every 6 (six) hours as needed for anxiety (spasms). 10 tablet 0  . sacubitril-valsartan (ENTRESTO) 49-51 MG Take 1 tablet by mouth 2 (two) times daily. 56 tablet 0  . spironolactone (ALDACTONE) 50 MG tablet Take 1 tablet (50 mg total) by mouth daily. 90 tablet 3   No current facility-administered medications for this visit.    Past Medical History  Diagnosis Date  . Overweight(278.02)   . 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 Hypertension   . Sciatica   . CHF (congestive heart failure) [facility])     Past Surgical History  Procedure Laterality Date  . Abdominal hysterectomy      ROS:   As stated in the HPI and negative for all other systems.  PHYSICAL EXAM BP 98/74 mmHg  Pulse 88  Ht 5\' 7"  (1.702 m)  Wt 216 lb 2 oz (98.034 kg)  BMI 33.84 kg/m2 GENERAL:  Well appearing NECK:  No jugular venous distention, waveform within  normal limits, carotid upstroke brisk and symmetric, no bruits, no thyromegaly LYMPHATICS:  No cervical, inguinal adenopathy LUNGS:  Clear to auscultation bilaterally BACK:  No CVA tenderness CHEST:  Unremarkable HEART:  PMI not displaced or sustained,S1 and S2 within normal limits, no S3, no S4, no clicks, no rubs, no murmurs ABD:  Flat, positive bowel sounds normal in frequency in pitch, no bruits, no rebound, no guarding, no midline pulsatile mass, no hepatomegaly, no splenomegaly EXT:  2 plus pulses throughout, no edema, no cyanosis no clubbing   ASSESSMENT AND PLAN  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 She will remain on the meds as listed.  She is going to send me her most recent BMET.  I will repeat an echo when I see her back in January.    HTN:  Her blood pressure is controlled.  This is being managed in the context of treating his CHF.  CKD:   As above.   DYSLIPIDEMIA:     I increased the statin at the last visit with the results below.  She needs a repeat lipid profile and liver enzymes. Lab Results  Component Value Date   CHOL 211* 08/11/2015   TRIG 68 08/11/2015   HDL 66  08/11/2015   LDLCALC 131* 08/11/2015

## 2016-03-01 ENCOUNTER — Encounter: Payer: Self-pay | Admitting: Cardiology

## 2016-03-01 ENCOUNTER — Ambulatory Visit (INDEPENDENT_AMBULATORY_CARE_PROVIDER_SITE_OTHER): Payer: Managed Care, Other (non HMO) | Admitting: Cardiology

## 2016-03-01 VITALS — BP 98/74 | HR 88 | Ht 67.0 in | Wt 216.1 lb

## 2016-03-01 DIAGNOSIS — E785 Hyperlipidemia, unspecified: Secondary | ICD-10-CM | POA: Diagnosis not present

## 2016-03-01 DIAGNOSIS — I429 Cardiomyopathy, unspecified: Secondary | ICD-10-CM | POA: Diagnosis not present

## 2016-03-01 DIAGNOSIS — Z79899 Other long term (current) drug therapy: Secondary | ICD-10-CM | POA: Diagnosis not present

## 2016-03-01 NOTE — Patient Instructions (Signed)
Your physician recommends that you schedule a follow-up appointment in: January, 2018 after Echo  Your physician has requested that you have an echocardiogram in January. Echocardiography is a painless test that uses sound waves to create images of your heart. It provides your doctor with information about the size and shape of your heart and how well your heart's chambers and valves are working. This procedure takes approximately one hour. There are no restrictions for this procedure.  Your physician recommends that you return for lab work in: Fasting Lipids Liver

## 2016-04-12 ENCOUNTER — Encounter: Payer: Self-pay | Admitting: Cardiology

## 2016-04-12 LAB — IFOBT (OCCULT BLOOD): IFOBT: NEGATIVE

## 2016-07-26 ENCOUNTER — Telehealth: Payer: Self-pay | Admitting: Cardiology

## 2016-07-26 NOTE — Telephone Encounter (Signed)
Left message for pt to call.

## 2016-07-26 NOTE — Telephone Encounter (Signed)
°*  STAT* If patient is at the pharmacy, call can be transferred to refill team.   1. Which medications need to be refilled? (please list name of each medication and dose if known) entresto 49-51mg   2. Which pharmacy/location (including street and city if local pharmacy) is medication to be sent to? [facility] Dr. Percival Spanish set pt up with this pharmacy  3. Do they need a 30 day or 90 day supply? [place]

## 2016-07-31 ENCOUNTER — Other Ambulatory Visit: Payer: Self-pay | Admitting: Cardiology

## 2016-07-31 ENCOUNTER — Other Ambulatory Visit: Payer: Self-pay | Admitting: *Deleted

## 2016-07-31 MED ORDER — SACUBITRIL-VALSARTAN 49-51 MG PO TABS: 1.0000 | ORAL_TABLET | Freq: Two times a day (BID) | ORAL | 3 refills | Status: DC

## 2016-07-31 NOTE — Telephone Encounter (Signed)
Received form from [employer], [employer]  Rx Entresto 49-51 mg print and waiting for Dr hochrein to sign and faxed back.Marland Kitchen

## 2016-07-31 NOTE — Telephone Encounter (Signed)
Rx(s) sent to pharmacy electronically.  

## 2016-08-01 ENCOUNTER — Telehealth: Payer: Self-pay | Admitting: Cardiology

## 2016-08-01 NOTE — Telephone Encounter (Signed)
Dr. Percival Spanish pt. Please advise

## 2016-08-01 NOTE — Telephone Encounter (Signed)
New message      *STAT* If patient is at the pharmacy, call can be transferred to refill team.   1. Which medications need to be refilled? (please list name of each medication and dose if known)  entresol 49-51 mg   2. Which pharmacy/location (including street and city if local pharmacy) is medication to be sent to? [facility]   3. Do they need a 30 day or 90 day supply? 90 days

## 2016-08-01 NOTE — Telephone Encounter (Signed)
Refill has been completed.

## 2016-08-02 NOTE — Telephone Encounter (Signed)
LEFT MESSAGE FOR PATIENT TO CALL BACK -  IT LOOKS AS IF MEDICATION WAS REFILLED ON 07/31/16 TO [name]

## 2016-08-06 NOTE — Telephone Encounter (Signed)
Sign prescription for entresto 49-51 mg was send into [employer]

## 2016-09-04 ENCOUNTER — Other Ambulatory Visit: Payer: Self-pay | Admitting: *Deleted

## 2016-09-04 MED ORDER — SACUBITRIL-VALSARTAN 49-51 MG PO TABS: 1.0000 | ORAL_TABLET | Freq: Two times a day (BID) | ORAL | 3 refills | Status: DC

## 2016-09-30 ENCOUNTER — Telehealth: Payer: Self-pay

## 2016-09-30 ENCOUNTER — Telehealth: Payer: Self-pay | Admitting: Cardiology

## 2016-09-30 NOTE — Telephone Encounter (Signed)
Returned call to patient no answer.Left message on personal voice mail [name] has already left for the day she will return your call tomorrow.

## 2016-09-30 NOTE — Telephone Encounter (Signed)
Received a call from patient.She stated she was returning a call from our office.Stated she has not had any lab or test done.After reviewing chart I will send message to our schedulers.Echo and follow up visit with Dr.Hochrein to be scheduled in 11/2016.Advised schedulers will call back to schedule.

## 2016-09-30 NOTE — Telephone Encounter (Signed)
Called patient and left a voicemail to call me to schedule her followup with Dr. Percival Spanish after her echo in January 2018.

## 2016-09-30 NOTE — Telephone Encounter (Signed)
New message  Pt is returning call to [facility]  Please call back

## 2016-10-02 ENCOUNTER — Telehealth: Payer: Self-pay | Admitting: Cardiology

## 2016-10-02 NOTE — Telephone Encounter (Signed)
Returning your call,please call after 3,she will be able to talk after that time.

## 2016-10-03 NOTE — Telephone Encounter (Signed)
Form for pt Entresto rx mail out to pt to fill out

## 2016-10-23 ENCOUNTER — Other Ambulatory Visit: Payer: Self-pay | Admitting: Cardiology

## 2016-10-23 DIAGNOSIS — E785 Hyperlipidemia, unspecified: Secondary | ICD-10-CM

## 2016-10-25 ENCOUNTER — Other Ambulatory Visit: Payer: Self-pay | Admitting: Cardiology

## 2016-11-08 ENCOUNTER — Ambulatory Visit: Payer: Managed Care, Other (non HMO) | Admitting: Cardiology

## 2016-11-11 ENCOUNTER — Telehealth (HOSPITAL_COMMUNITY): Payer: Self-pay | Admitting: Cardiology

## 2016-11-14 NOTE — Telephone Encounter (Signed)
11/11/16 Called pt and lmsg for her to CB.Marland Kitchento reschedule her echo.RG     By Verdene Rio

## 2016-11-20 ENCOUNTER — Other Ambulatory Visit (HOSPITAL_COMMUNITY): Payer: Managed Care, Other (non HMO)

## 2016-11-22 ENCOUNTER — Ambulatory Visit: Payer: Managed Care, Other (non HMO) | Admitting: Cardiology

## 2016-11-29 ENCOUNTER — Other Ambulatory Visit (HOSPITAL_COMMUNITY): Payer: Managed Care, Other (non HMO)

## 2016-11-29 ENCOUNTER — Ambulatory Visit: Payer: Managed Care, Other (non HMO) | Admitting: Cardiology

## 2016-12-17 NOTE — Progress Notes (Signed)
HPI The patient has not been back in follow up of HTN and cardiomyopathy  Since I last saw her she has done well from a cardiac standpoint.  On her last  echo  EF is now up to 45%.  She had an echo this morning and I did review these images hernia appears to be normal now. She is done well from a cardiovascular standpoint. The patient denies any new symptoms such as chest discomfort, neck or arm discomfort. There has been no new shortness of breath, PND or orthopnea. There have been no reported palpitations, presyncope or syncope.  She is exercising a little.    Allergies  Allergen Reactions  . Latex Itching    Current Outpatient Prescriptions  Medication Sig Dispense Refill  . acetaminophen (TYLENOL) 325 MG tablet Take 2 tablets (650 mg total) by mouth every 4 (four) hours as needed for headache or mild pain.    Marland Kitchen amLODipine (NORVASC) 5 MG tablet TAKE 1 TABLET BY MOUTH EVERY DAY 90 tablet 2  . atorvastatin (LIPITOR) 40 MG tablet TAKE 1 TABLET(40 MG) BY MOUTH DAILY AT 6 PM 90 tablet 0  . BELSOMRA 10 MG TABS Take 1 tablet by mouth as needed.  0  . carvedilol (COREG) 25 MG tablet Take 1 tablet (25 mg total) by mouth 2 (two) times daily with a meal. 180 tablet 1  . diazepam (VALIUM) 5 MG tablet Take 1 tablet (5 mg total) by mouth every 6 (six) hours as needed for anxiety (spasms). 10 tablet 0  . sacubitril-valsartan (ENTRESTO) 49-51 MG Take 1 tablet by mouth 2 (two) times daily. 180 tablet 3  . spironolactone (ALDACTONE) 50 MG tablet Take 1 tablet (50 mg total) by mouth daily. Please keep appointment. 90 tablet 0   No current facility-administered medications for this visit.     Past Medical History:  Diagnosis Date  . CHF (congestive heart failure) ([place])   . Hypertension   . Overweight(278.02)   . Sciatica   . Secondary cardiomyopathy, unspecified    EF now 45%    Past Surgical History:  Procedure Laterality Date  . ABDOMINAL HYSTERECTOMY      ROS:     As stated in the HPI  and negative for all other systems.  PHYSICAL EXAM BP (!) 148/97   Pulse 77   Ht 5\' 7"  (1.702 m)   Wt 217 lb 3.2 oz (98.5 kg)   BMI 34.02 kg/m  GENERAL:  Well appearing NECK:  No jugular venous distention, waveform within normal limits, carotid upstroke brisk and symmetric, no bruits, no thyromegaly LUNGS:  Clear to auscultation bilaterally CHEST:  Unremarkable HEART:  PMI not displaced or sustained,S1 and S2 within normal limits, no S3, no S4, no clicks, no rubs, no murmurs ABD:  Flat, positive bowel sounds normal in frequency in pitch, no bruits, no rebound, no guarding, no midline pulsatile mass, no hepatomegaly, no splenomegaly EXT:  2 plus pulses throughout, trace edema, no cyanosis no clubbing  EKG:  Sinus rhythm, rate 77, axis within normal limits, intervals within normal limits, poor anterior R wave progression  ASSESSMENT AND PLAN  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 She will remain on the meds as listed.   She is going to have a BMET checked next week by Velna Hatchet, MD   (I personally reviewed the echo images.)  HTN:  Her blood pressure is mildly elevated today but this is unusual.  This is being managed in the context of  treating her CHF.  CKD:   As above.   DYSLIPIDEMIA:    Per Velna Hatchet, MD

## 2016-12-18 ENCOUNTER — Encounter: Payer: Self-pay | Admitting: Cardiology

## 2016-12-18 ENCOUNTER — Ambulatory Visit (HOSPITAL_COMMUNITY): Payer: Managed Care, Other (non HMO) | Attending: Cardiology

## 2016-12-18 ENCOUNTER — Other Ambulatory Visit: Payer: Self-pay

## 2016-12-18 ENCOUNTER — Ambulatory Visit (INDEPENDENT_AMBULATORY_CARE_PROVIDER_SITE_OTHER): Payer: Managed Care, Other (non HMO) | Admitting: Cardiology

## 2016-12-18 VITALS — BP 148/97 | HR 77 | Ht 67.0 in | Wt 217.2 lb

## 2016-12-18 DIAGNOSIS — Z6833 Body mass index (BMI) 33.0-33.9, adult: Secondary | ICD-10-CM | POA: Insufficient documentation

## 2016-12-18 DIAGNOSIS — I10 Essential (primary) hypertension: Secondary | ICD-10-CM | POA: Diagnosis not present

## 2016-12-18 DIAGNOSIS — I509 Heart failure, unspecified: Secondary | ICD-10-CM | POA: Diagnosis not present

## 2016-12-18 DIAGNOSIS — I429 Cardiomyopathy, unspecified: Secondary | ICD-10-CM | POA: Diagnosis not present

## 2016-12-18 DIAGNOSIS — Z8249 Family history of ischemic heart disease and other diseases of the circulatory system: Secondary | ICD-10-CM | POA: Insufficient documentation

## 2016-12-18 DIAGNOSIS — E785 Hyperlipidemia, unspecified: Secondary | ICD-10-CM | POA: Insufficient documentation

## 2016-12-18 DIAGNOSIS — I77810 Thoracic aortic ectasia: Secondary | ICD-10-CM | POA: Insufficient documentation

## 2016-12-18 DIAGNOSIS — I110 Hypertensive heart disease with heart failure: Secondary | ICD-10-CM | POA: Diagnosis not present

## 2016-12-18 DIAGNOSIS — E669 Obesity, unspecified: Secondary | ICD-10-CM | POA: Diagnosis not present

## 2016-12-18 DIAGNOSIS — I428 Other cardiomyopathies: Secondary | ICD-10-CM

## 2016-12-18 NOTE — Patient Instructions (Signed)

## 2016-12-19 ENCOUNTER — Encounter: Payer: Self-pay | Admitting: Cardiology

## 2017-02-15 ENCOUNTER — Other Ambulatory Visit: Payer: Self-pay | Admitting: Cardiology

## 2017-02-17 NOTE — Telephone Encounter (Signed)
Rx(s) sent to pharmacy electronically.  

## 2017-06-15 ENCOUNTER — Other Ambulatory Visit: Payer: Self-pay | Admitting: Cardiology

## 2017-06-15 DIAGNOSIS — E785 Hyperlipidemia, unspecified: Secondary | ICD-10-CM

## 2017-07-28 ENCOUNTER — Other Ambulatory Visit: Payer: Self-pay | Admitting: Cardiology

## 2017-07-31 ENCOUNTER — Other Ambulatory Visit: Payer: Self-pay

## 2017-07-31 MED ORDER — SACUBITRIL-VALSARTAN 49-51 MG PO TABS: 1.0000 | ORAL_TABLET | Freq: Two times a day (BID) | ORAL | 3 refills | Status: DC

## 2017-08-07 ENCOUNTER — Telehealth: Payer: Self-pay | Admitting: Cardiology

## 2017-08-07 NOTE — Telephone Encounter (Signed)
Left message for pt to call.

## 2017-08-07 NOTE — Telephone Encounter (Signed)
°  New Prob   *STAT* If patient is at the pharmacy, call can be transferred to refill team.   1. Which medications need to be refilled? (please list name of each medication and dose if known)  Entresto 49-51 mg  2. Which pharmacy/location (including street and city if local pharmacy) is medication to be sent to? Must be faxed to [facility]   3. Do they need a 30 day or 90 day supply? [place]

## 2017-08-08 NOTE — Telephone Encounter (Signed)
Left message for pt to call.

## 2017-08-11 NOTE — Telephone Encounter (Signed)
[id number] like we just sent this to [facility] last month????

## 2017-08-13 NOTE — Telephone Encounter (Signed)
FORWARD  TO [facility]

## 2017-08-13 NOTE — Telephone Encounter (Signed)
Spoke with pt letting her know her medications is up to date she received a prescription on 09/27 and the next one is schedule to dispense is December.

## 2017-09-06 ENCOUNTER — Other Ambulatory Visit: Payer: Self-pay | Admitting: Cardiology

## 2017-11-05 ENCOUNTER — Other Ambulatory Visit: Payer: Self-pay | Admitting: Internal Medicine

## 2017-11-05 DIAGNOSIS — D249 Benign neoplasm of unspecified breast: Secondary | ICD-10-CM

## 2017-11-11 ENCOUNTER — Other Ambulatory Visit: Payer: Self-pay | Admitting: Cardiology

## 2017-11-14 ENCOUNTER — Ambulatory Visit
Admission: RE | Admit: 2017-11-14 | Discharge: 2017-11-14 | Disposition: A | Discharge status: Home / Self Care | Payer: Managed Care, Other (non HMO) | Source: Ambulatory Visit | Attending: Internal Medicine | Admitting: Internal Medicine

## 2017-11-14 DIAGNOSIS — D249 Benign neoplasm of unspecified breast: Secondary | ICD-10-CM

## 2017-12-14 ENCOUNTER — Other Ambulatory Visit: Payer: Self-pay | Admitting: Cardiology

## 2017-12-14 DIAGNOSIS — E785 Hyperlipidemia, unspecified: Secondary | ICD-10-CM

## 2017-12-15 NOTE — Telephone Encounter (Signed)
REFILL 

## 2017-12-29 NOTE — Progress Notes (Deleted)
HPI The patient has not been back in follow up of HTN and cardiomyopathy  Since I last saw her she has done well from a cardiac standpoint.  On her last echo  EF was increased to normal at the last appt.  ***    is now up to 45%.  She had an echo this morning and I did review these images hernia appears to be normal now. She is done well from a cardiovascular standpoint. The patient denies any new symptoms such as chest discomfort, neck or arm discomfort. There has been no new shortness of breath, PND or orthopnea. There have been no reported palpitations, presyncope or syncope.  She is exercising a little.    Allergies  Allergen Reactions  . Latex Itching    Current Outpatient Medications  Medication Sig Dispense Refill  . acetaminophen (TYLENOL) 325 MG tablet Take 2 tablets (650 mg total) by mouth every 4 (four) hours as needed for headache or mild pain.    Marland Kitchen amLODipine (NORVASC) 5 MG tablet TAKE 1 TABLET BY MOUTH EVERY DAY 90 tablet 0  . atorvastatin (LIPITOR) 40 MG tablet TAKE 1 TABLET BY MOUTH EVERY DAY AT 6PM 90 tablet 0  . BELSOMRA 10 MG TABS Take 1 tablet by mouth as needed.  0  . carvedilol (COREG) 12.5 MG tablet TAKE 1 TABLET BY MOUTH TWICE DAILY WITH MEALS 60 tablet 10  . carvedilol (COREG) 25 MG tablet Take 1 tablet (25 mg total) by mouth 2 (two) times daily with a meal. 180 tablet 1  . diazepam (VALIUM) 5 MG tablet Take 1 tablet (5 mg total) by mouth every 6 (six) hours as needed for anxiety (spasms). 10 tablet 0  . sacubitril-valsartan (ENTRESTO) 49-51 MG Take 1 tablet by mouth 2 (two) times daily. 180 tablet 3  . spironolactone (ALDACTONE) 50 MG tablet TAKE 1 TABLET BY MOUTH DAILY 90 tablet 0   No current facility-administered medications for this visit.     Past Medical History:  Diagnosis Date  . CHF (congestive heart failure) ([place])   . Hypertension   . Overweight(278.02)   . Sciatica   . Secondary cardiomyopathy, unspecified    EF now 45%    Past  Surgical History:  Procedure Laterality Date  . ABDOMINAL HYSTERECTOMY      ROS:   ***  PHYSICAL EXAM There were no vitals taken for this visit.  GENERAL:  Well appearing NECK:  No jugular venous distention, waveform within normal limits, carotid upstroke brisk and symmetric, no bruits, no thyromegaly LUNGS:  Clear to auscultation bilaterally CHEST:  Unremarkable HEART:  PMI not displaced or sustained,S1 and S2 within normal limits, no S3, no S4, no clicks, no rubs, *** murmurs ABD:  Flat, positive bowel sounds normal in frequency in pitch, no bruits, no rebound, no guarding, no midline pulsatile mass, no hepatomegaly, no splenomegaly EXT:  2 plus pulses throughout, no edema, no cyanosis no clubbing     GENERAL:  Well appearing NECK:  No jugular venous distention, waveform within normal limits, carotid upstroke brisk and symmetric, no bruits, no thyromegaly LUNGS:  Clear to auscultation bilaterally CHEST:  Unremarkable HEART:  PMI not displaced or sustained,S1 and S2 within normal limits, no S3, no S4, no clicks, no rubs, no murmurs ABD:  Flat, positive bowel sounds normal in frequency in pitch, no bruits, no rebound, no guarding, no midline pulsatile mass, no hepatomegaly, no splenomegaly EXT:  2 plus pulses throughout, trace edema, no cyanosis no  clubbing  EKG:  Sinus rhythm, rate ***, axis within normal limits, intervals within normal limits, poor anterior R wave progression  ASSESSMENT AND PLAN  NONISCHEMIC CARDIOMYOPATHY:  ***  Her ejection fraction is improved. She will remain on the meds as listed.   She is going to have a BMET checked next week by Velna Hatchet, MD   (I personally reviewed the echo images.)  HTN:  ***   Her blood pressure is mildly elevated today but this is unusual.  This is being managed in the context of treating her CHF.  CKD:  ***   As above.   DYSLIPIDEMIA:  ***  Per Velna Hatchet, MD

## 2018-01-01 ENCOUNTER — Ambulatory Visit: Payer: Managed Care, Other (non HMO) | Admitting: Cardiology

## 2018-02-01 ENCOUNTER — Encounter (HOSPITAL_COMMUNITY): Payer: Self-pay | Admitting: Emergency Medicine

## 2018-02-01 ENCOUNTER — Ambulatory Visit (HOSPITAL_COMMUNITY)
Admission: EM | Admit: 2018-02-01 | Discharge: 2018-02-01 | Disposition: A | Discharge status: Home / Self Care | Payer: Managed Care, Other (non HMO)

## 2018-02-01 ENCOUNTER — Other Ambulatory Visit: Payer: Self-pay

## 2018-02-01 DIAGNOSIS — I10 Essential (primary) hypertension: Secondary | ICD-10-CM

## 2018-02-01 DIAGNOSIS — M25572 Pain in left ankle and joints of left foot: Secondary | ICD-10-CM

## 2018-02-01 DIAGNOSIS — I509 Heart failure, unspecified: Secondary | ICD-10-CM

## 2018-02-01 MED ORDER — MELOXICAM 7.5 MG PO TABS: 7.5000 mg | ORAL_TABLET | Freq: Every day | ORAL | 0 refills | Status: DC

## 2018-02-01 NOTE — ED Provider Notes (Signed)
  MRN: [id number] DOB: December 01, 1960  Subjective:   [name] is a 57 y.o. female presenting for 2 day history of left ankle soreness. Has tried propping her foot up with minimal relief. Has not tried medications for relief. Denies fever, redness, trauma, falls, history of gout. Denies smoking cigarettes. Patient works long shifts, 12 hours or more per shift, stands or walks the entire time.   No current facility-administered medications for this encounter.   Current Outpatient Medications:  Marland Kitchen  Zolpidem Tartrate (AMBIEN PO), Take by mouth., Disp: , Rfl:  .  acetaminophen (TYLENOL) 325 MG tablet, Take 2 tablets (650 mg total) by mouth every 4 (four) hours as needed for headache or mild pain., Disp: , Rfl:  .  amLODipine (NORVASC) 5 MG tablet, TAKE 1 TABLET BY MOUTH EVERY DAY, Disp: 90 tablet, Rfl: 0 .  atorvastatin (LIPITOR) 40 MG tablet, TAKE 1 TABLET BY MOUTH EVERY DAY AT 6PM, Disp: 90 tablet, Rfl: 0 .  BELSOMRA 10 MG TABS, Take 1 tablet by mouth as needed., Disp: , Rfl: 0 .  carvedilol (COREG) 12.5 MG tablet, TAKE 1 TABLET BY MOUTH TWICE DAILY WITH MEALS, Disp: 60 tablet, Rfl: 10 .  carvedilol (COREG) 25 MG tablet, Take 1 tablet (25 mg total) by mouth 2 (two) times daily with a meal., Disp: 180 tablet, Rfl: 1 .  diazepam (VALIUM) 5 MG tablet, Take 1 tablet (5 mg total) by mouth every 6 (six) hours as needed for anxiety (spasms)., Disp: 10 tablet, Rfl: 0 .  sacubitril-valsartan (ENTRESTO) 49-51 MG, Take 1 tablet by mouth 2 (two) times daily., Disp: 180 tablet, Rfl: 3 .  spironolactone (ALDACTONE) 50 MG tablet, TAKE 1 TABLET BY MOUTH DAILY, Disp: 90 tablet, Rfl: 0   Allergies  Allergen Reactions  . Latex Itching    Past Medical History:  Diagnosis Date  . CHF (congestive heart failure) ([place])   . Hypertension   . Overweight(278.02)   . Sciatica   . Secondary cardiomyopathy, unspecified    EF now 45%     Past Surgical History:  Procedure Laterality Date  . ABDOMINAL HYSTERECTOMY       Objective:   Vitals: BP 110/78 (BP Location: Left Arm)   Pulse 95   Temp 98.2 F (36.8 C) (Oral)   Resp 18   SpO2 99%   Physical Exam  Constitutional: She is oriented to person, place, and time. She appears well-developed and well-nourished.  Cardiovascular: Normal rate.  Pulmonary/Chest: Effort normal.  Musculoskeletal:       Left ankle: She exhibits swelling. She exhibits normal range of motion, no ecchymosis, no deformity, no laceration and normal pulse. Tenderness. Lateral malleolus tenderness found. No medial malleolus, no AITFL, no CF ligament, no posterior TFL, no head of 5th metatarsal and no proximal fibula tenderness found. Achilles tendon exhibits no pain and no defect.  Neurological: She is alert and oriented to person, place, and time.   Assessment and Plan :   Acute left ankle pain  Essential hypertension  Congestive heart failure, unspecified HF chronicity, unspecified heart failure type (HCC)  Recommended rest, NSAID, wearing compression stockings. I do not believe x-ray would help [place] today but counseled that if she has no improvement we can pursue this. Counseled patient on potential for adverse effects with medications prescribed today, patient verbalized understanding. Return-to-clinic precautions discussed, patient verbalized understanding.    Jaynee Eagles, PA-C 02/01/18 1443

## 2018-02-01 NOTE — ED Triage Notes (Signed)
On Friday felt soreness, Friday evening pain worsened.  Pain is in lateral left ankle.  No pain in foot.    Patient worked more hours recently.  Patient does have new shoes she has worn randomly.

## 2018-02-02 NOTE — Progress Notes (Signed)
   HPI The patient has not been back in follow up of HTN and cardiomyopathy   Her EF on the last echo was 45% in Feb 2018.   Since I last saw her she has been doing well.  The patient denies any new symptoms such as chest discomfort, neck or arm discomfort. There has been no new shortness of breath, PND or orthopnea. There have been no reported palpitations, presyncope or syncope.  She exercises a few times per week.    Allergies  Allergen Reactions  . Latex Itching    Current Outpatient Medications  Medication Sig Dispense Refill  . acetaminophen (TYLENOL) 325 MG tablet Take 2 tablets (650 mg total) by mouth every 4 (four) hours as needed for headache or mild pain.    Marland Kitchen amLODipine (NORVASC) 5 MG tablet TAKE 1 TABLET BY MOUTH EVERY DAY 90 tablet 0  . atorvastatin (LIPITOR) 40 MG tablet TAKE 1 TABLET BY MOUTH EVERY DAY AT 6PM 90 tablet 0  . carvedilol (COREG) 25 MG tablet Take 1 tablet (25 mg total) by mouth 2 (two) times daily with a meal. 180 tablet 1  . sacubitril-valsartan (ENTRESTO) 49-51 MG Take 1 tablet by mouth 2 (two) times daily. 180 tablet 3  . spironolactone (ALDACTONE) 50 MG tablet TAKE 1 TABLET BY MOUTH DAILY 90 tablet 0  . meloxicam (MOBIC) 7.5 MG tablet Take 1 tablet (7.5 mg total) by mouth daily. (Patient not taking: Reported on 02/03/2018) 30 tablet 0  . zolpidem (AMBIEN) 5 MG tablet TK 1 T PO QHS PRF INSOMNIA  1   No current facility-administered medications for this visit.     Past Medical History:  Diagnosis Date  . CHF (congestive heart failure) ([place])   . Hypertension   . Overweight(278.02)   . Sciatica   . Secondary cardiomyopathy, unspecified    EF now 45%    Past Surgical History:  Procedure Laterality Date  . ABDOMINAL HYSTERECTOMY      ROS:     As stated in the HPI and negative for all other systems.  PHYSICAL EXAM BP 130/86   Pulse 84   Ht 5\' 7"  (1.702 m)   Wt 214 lb 12.8 oz (97.4 kg)   BMI 33.64 kg/m   GENERAL:  Well appearing NECK:  No  jugular venous distention, waveform within normal limits, carotid upstroke brisk and symmetric, no bruits, no thyromegaly LUNGS:  Clear to auscultation bilaterally CHEST:  Unremarkable HEART:  PMI not displaced or sustained,S1 and S2 within normal limits, no S3, no S4, no clicks, no rubs, no murmurs ABD:  Flat, positive bowel sounds normal in frequency in pitch, no bruits, no rebound, no guarding, no midline pulsatile mass, no hepatomegaly, no splenomegaly EXT:  2 plus pulses throughout, no edema, no cyanosis no clubbing    EKG:  Sinus rhythm, rate 84, axis within normal limits, intervals within normal limits, poor anterior R wave progression.  02/03/18  ASSESSMENT AND PLAN  NONISCHEMIC CARDIOMYOPATHY:  Her ejection fraction was as above.  I will check another echocardiogram later this year (18 months after the last).  For now she will remain on the meds as listed.   HTN:  This is being managed in the context of treating his CHF  CKD:   Her Creat in Jan was 1.8.  She will remain on the meds as listed.   DYSLIPIDEMIA:   LDL was 132.  I will defer to  Per Velna Hatchet, MD

## 2018-02-03 ENCOUNTER — Encounter: Payer: Self-pay | Admitting: Cardiology

## 2018-02-03 ENCOUNTER — Ambulatory Visit: Payer: Managed Care, Other (non HMO) | Admitting: Cardiology

## 2018-02-03 VITALS — BP 130/86 | HR 84 | Ht 67.0 in | Wt 214.8 lb

## 2018-02-03 DIAGNOSIS — N183 Chronic kidney disease, stage 3 unspecified: Secondary | ICD-10-CM | POA: Insufficient documentation

## 2018-02-03 DIAGNOSIS — I429 Cardiomyopathy, unspecified: Secondary | ICD-10-CM | POA: Diagnosis not present

## 2018-02-03 DIAGNOSIS — I10 Essential (primary) hypertension: Secondary | ICD-10-CM

## 2018-02-03 NOTE — Patient Instructions (Signed)
Medication Instructions:  Continue current medications  If you need a refill on your cardiac medications before your next appointment, please call your pharmacy.  Labwork: None Ordered   Testing/Procedures: Your physician has requested that you have an echocardiogram in August 2019. Echocardiography is a painless test that uses sound waves to create images of your heart. It provides your doctor with information about the size and shape of your heart and how well your heart's chambers and valves are working. This procedure takes approximately one hour. There are no restrictions for this procedure.  Follow-Up: Your physician wants you to follow-up in: 1 Year. You should receive a reminder letter in the mail two months in advance. If you do not receive a letter, please call our office [phone].     Thank you for choosing [facility]!!

## 2018-02-16 ENCOUNTER — Other Ambulatory Visit: Payer: Self-pay | Admitting: Cardiology

## 2018-02-17 NOTE — Telephone Encounter (Signed)
Rx(s) sent to pharmacy electronically.  

## 2018-03-10 ENCOUNTER — Telehealth: Payer: Self-pay | Admitting: *Deleted

## 2018-03-10 MED ORDER — SACUBITRIL-VALSARTAN 49-51 MG PO TABS: 1.0000 | ORAL_TABLET | Freq: Two times a day (BID) | ORAL | 3 refills | Status: DC

## 2018-03-10 NOTE — Telephone Encounter (Signed)
Rx for Entresto given Dr Percival Spanish to signed to faxed [employer] Patient Assistance.

## 2018-03-15 ENCOUNTER — Other Ambulatory Visit: Payer: Self-pay | Admitting: Cardiology

## 2018-03-15 DIAGNOSIS — E785 Hyperlipidemia, unspecified: Secondary | ICD-10-CM

## 2018-03-16 NOTE — Telephone Encounter (Signed)
Rx has been sent to the pharmacy electronically. ° °

## 2018-03-20 ENCOUNTER — Other Ambulatory Visit: Payer: Self-pay | Admitting: Cardiology

## 2018-03-20 NOTE — Telephone Encounter (Signed)
Rx request sent to pharmacy.  

## 2018-03-26 ENCOUNTER — Telehealth: Payer: Self-pay | Admitting: Cardiology

## 2018-03-26 NOTE — Telephone Encounter (Signed)
New Message:      Pt is returning a call for RN

## 2018-03-31 NOTE — Telephone Encounter (Signed)
Spoke with pt

## 2018-06-09 ENCOUNTER — Other Ambulatory Visit: Payer: Self-pay

## 2018-06-09 ENCOUNTER — Ambulatory Visit (HOSPITAL_COMMUNITY): Payer: Managed Care, Other (non HMO) | Attending: Cardiology

## 2018-06-09 ENCOUNTER — Encounter (INDEPENDENT_AMBULATORY_CARE_PROVIDER_SITE_OTHER): Payer: Self-pay

## 2018-06-09 DIAGNOSIS — I77810 Thoracic aortic ectasia: Secondary | ICD-10-CM | POA: Diagnosis not present

## 2018-06-09 DIAGNOSIS — I429 Cardiomyopathy, unspecified: Secondary | ICD-10-CM | POA: Diagnosis present

## 2018-06-09 DIAGNOSIS — N189 Chronic kidney disease, unspecified: Secondary | ICD-10-CM | POA: Insufficient documentation

## 2018-06-09 DIAGNOSIS — I1310 Hypertensive heart and chronic kidney disease without heart failure, with stage 1 through stage 4 chronic kidney disease, or unspecified chronic kidney disease: Secondary | ICD-10-CM | POA: Insufficient documentation

## 2018-08-11 ENCOUNTER — Encounter (HOSPITAL_COMMUNITY): Payer: Self-pay | Admitting: Emergency Medicine

## 2018-08-11 ENCOUNTER — Ambulatory Visit (HOSPITAL_COMMUNITY)
Admission: EM | Admit: 2018-08-11 | Discharge: 2018-08-11 | Disposition: A | Discharge status: Home / Self Care | Payer: Managed Care, Other (non HMO) | Attending: Family Medicine | Admitting: Family Medicine

## 2018-08-11 DIAGNOSIS — M7662 Achilles tendinitis, left leg: Secondary | ICD-10-CM | POA: Diagnosis not present

## 2018-08-11 NOTE — ED Provider Notes (Signed)
[place]   [id number] 08/11/18 Arrival Time: 5277  ASSESSMENT & PLAN:  1. Achilles tendinitis of left lower extremity    She has meloxicam at home to take daily for the next 3-5 days. Work note given. Relative rest with WBAT.  Follow-up Information    Velna Hatchet, MD.   Specialty:  Internal Medicine Why:  If you are not seeing improvement over the next few days. Contact information: [address] [place] [address] [phone]           Reviewed expectations re: course of current medical issues. Questions answered. Outlined signs and symptoms indicating need for more acute intervention. Patient verbalized understanding. After Visit Summary given.  SUBJECTIVE: History from: patient. [name] is a 57 y.o. female who reports persistent moderate pain of her left ankle that has progressed to a point and plateaued since beginning; described as aching without radiation. Onset: gradual, ove the past week. Injury/trama: no, but reports walking "all day" at work. Relieved by: movement. Worsened by: rest. Associated symptoms: none reported. Extremity sensation changes or weakness: none. Self treatment: has not tried OTCs for relief of pain. History of similar: yes; resolved with NSAID and rest  ROS: As per HPI.   OBJECTIVE:  Vitals:   08/11/18 1153  BP: 140/83  Pulse: 86  Resp: 18  Temp: 98.5 F (36.9 C)  TempSrc: Oral  SpO2: 100%    General appearance: alert; no distress Extremities: warm and well perfused; symmetrical with no gross deformities; localized tenderness over her left distal Achilles tendon with no swelling and no bruising; no swelling here; ankle with FROM CV: brisk extremity capillary refill Skin: warm and dry Neurologic: normal gait; normal symmetric reflexes in all extremities; normal sensation in all extremities Psychological: alert and cooperative; normal mood and affect  Allergies  Allergen Reactions  . Latex  Itching    Past Medical History:  Diagnosis Date  . CHF (congestive heart failure) ([place])   . Hypertension   . Overweight(278.02)   . Sciatica   . Secondary cardiomyopathy, unspecified    EF now 45%   Social History   Socioeconomic History  . Marital status: Single    Spouse name: Not on file  . Number of children: Not on file  . Years of education: Not on file  . Highest education level: Not on file  Occupational History  . Not on file  Social Needs  . Financial resource strain: Not on file  . Food insecurity:    Worry: Not on file    Inability: Not on file  . Transportation needs:    Medical: Not on file    Non-medical: Not on file  Tobacco Use  . Smoking status: Never Smoker  . Smokeless tobacco: Never Used  Substance and Sexual Activity  . Alcohol use: Yes    Alcohol/week: 7.0 - 14.0 standard drinks    Types: 7 - 14 Cans of beer per week    Comment: social-daily beers  . Drug use: No  . Sexual activity: Yes    Birth control/protection: Surgical  Lifestyle  . Physical activity:    Days per week: Not on file    Minutes per session: Not on file  . Stress: Not on file  Relationships  . Social connections:    Talks on phone: Not on file    Gets together: Not on file    Attends religious service: Not on file    Active member of club or organization: Not  on file    Attends meetings of clubs or organizations: Not on file    Relationship status: Not on file  Other Topics Concern  . Not on file  Social History Narrative  . Not on file   Family History  Problem Relation Age of Onset  . Cerebral aneurysm Mother 46       ruptured  . Hypertension Mother        severe  . Heart attack Father 61  . Coronary artery disease Unknown   . Hypertension Brother        severe  . Colon cancer Neg Hx   . Breast cancer Neg Hx    Past Surgical History:  Procedure Laterality Date  . ABDOMINAL HYSTERECTOMY        Vanessa Kick, MD 08/11/18 1317

## 2018-08-11 NOTE — ED Triage Notes (Signed)
Pt sts left foot pain she thinks is from plantar fascitis

## 2019-01-05 ENCOUNTER — Telehealth: Payer: Self-pay | Admitting: Cardiology

## 2019-01-05 MED ORDER — SACUBITRIL-VALSARTAN 49-51 MG PO TABS: 1.0000 | ORAL_TABLET | Freq: Two times a day (BID) | ORAL | 3 refills | Status: DC

## 2019-01-05 NOTE — Telephone Encounter (Signed)
New Message         Patient calling the office for samples of medication:   1.  What medication and dosage are you requesting samples for? Entresto  2.  Are you currently out of this medication? Yes  [phone] Patient #

## 2019-01-05 NOTE — Telephone Encounter (Signed)
Advised patient no samples. Did send Rx for Entresto with copay card information to [facility]. Mailed patient card.

## 2019-02-03 ENCOUNTER — Telehealth: Payer: Self-pay | Admitting: Cardiology

## 2019-02-03 NOTE — Telephone Encounter (Signed)
New Message    Pt is returning a phone call. She said someone called Monday and Wednesday and didn't leave a message. She works until [employer] and asked to have her call returned after 3pm  Thank you

## 2019-02-04 ENCOUNTER — Telehealth: Payer: Self-pay

## 2019-02-04 NOTE — Telephone Encounter (Signed)
New Message   Patient returning [place] phone call.

## 2019-02-04 NOTE — Telephone Encounter (Signed)
lmtcb

## 2019-02-05 ENCOUNTER — Ambulatory Visit (INDEPENDENT_AMBULATORY_CARE_PROVIDER_SITE_OTHER): Payer: Managed Care, Other (non HMO) | Admitting: Cardiology

## 2019-02-05 ENCOUNTER — Encounter: Payer: Self-pay | Admitting: Cardiology

## 2019-02-05 VITALS — BP 130/90 | Ht 67.0 in | Wt 203.0 lb

## 2019-02-05 DIAGNOSIS — I10 Essential (primary) hypertension: Secondary | ICD-10-CM

## 2019-02-05 DIAGNOSIS — I429 Cardiomyopathy, unspecified: Secondary | ICD-10-CM

## 2019-02-05 DIAGNOSIS — Z7189 Other specified counseling: Secondary | ICD-10-CM | POA: Diagnosis not present

## 2019-02-05 DIAGNOSIS — E785 Hyperlipidemia, unspecified: Secondary | ICD-10-CM

## 2019-02-05 DIAGNOSIS — Z79899 Other long term (current) drug therapy: Secondary | ICD-10-CM

## 2019-02-05 MED ORDER — SACUBITRIL-VALSARTAN 49-51 MG PO TABS: 1.0000 | ORAL_TABLET | Freq: Two times a day (BID) | ORAL | 3 refills | Status: DC

## 2019-02-05 MED ORDER — ATORVASTATIN CALCIUM 40 MG PO TABS: 40.0000 mg | ORAL_TABLET | Freq: Every day | ORAL | 3 refills | Status: DC

## 2019-02-05 MED ORDER — AMLODIPINE BESYLATE 5 MG PO TABS: 5.0000 mg | ORAL_TABLET | Freq: Every day | ORAL | 3 refills | Status: DC

## 2019-02-05 MED ORDER — SPIRONOLACTONE 50 MG PO TABS: 50.0000 mg | ORAL_TABLET | Freq: Every day | ORAL | 3 refills | Status: DC

## 2019-02-05 MED ORDER — CARVEDILOL 12.5 MG PO TABS: 12.5000 mg | ORAL_TABLET | Freq: Two times a day (BID) | ORAL | 3 refills | Status: DC

## 2019-02-05 NOTE — Progress Notes (Signed)
Virtual Visit via Video Note    Evaluation Performed:  Follow-up visit  This visit type was conducted due to national recommendations for restrictions regarding the COVID-19 Pandemic (e.g. social distancing).  This format is felt to be most appropriate for this patient at this time.  All issues noted in this document were discussed and addressed.  No physical exam was performed (except for noted visual exam findings with Video Visits).  Please refer to the patient's chart (MyChart message for video visits and phone note for telephone visits) for the patient's consent to telehealth for [facility].  Date:  02/05/2019   ID:  [name], DOB 20-Jun-1961, MRN [id number]  Patient Location:  Home address  Provider location:   [facility] office   PCP:  Velna Hatchet, MD  Cardiologist:  Minus Breeding, MD  Electrophysiologist:  None   Chief Complaint:  Cardiomyopathy  History of Present Illness:    [name] is a 58 y.o. female who presents via audio/video conferencing for a telehealth visit today.    The patient has not been back in follow up of HTN and cardiomyopathy   Her EF was 30% in the past.  On the last echo last year it was up to 50 - 55%.    Since I last saw him he has done well.  The patient denies any new symptoms such as chest discomfort, neck or arm discomfort. There has been no new shortness of breath, PND or orthopnea. There have been no reported palpitations, presyncope or syncope.  She is being active.    The patient does not have symptoms concerning for COVID-19 infection (fever, chills, cough, or new shortness of breath).    Prior CV studies:   The following studies were reviewed today:  Labs  Past Medical History:  Diagnosis Date  . CHF (congestive heart failure) ([place])   . Hypertension   . Overweight(278.02)   . Sciatica   . Secondary cardiomyopathy, unspecified    EF now 45%   Past Surgical History:  Procedure Laterality Date  . ABDOMINAL HYSTERECTOMY        Current Meds  Medication Sig  . acetaminophen (TYLENOL) 325 MG tablet Take 2 tablets (650 mg total) by mouth every 4 (four) hours as needed for headache or mild pain.  Marland Kitchen amLODipine (NORVASC) 5 MG tablet Take 1 tablet (5 mg total) by mouth daily.  Marland Kitchen atorvastatin (LIPITOR) 40 MG tablet Take 1 tablet (40 mg total) by mouth daily at 6 PM.  . carvedilol (COREG) 12.5 MG tablet Take 1 tablet (12.5 mg total) by mouth 2 (two) times daily with a meal.  . sacubitril-valsartan (ENTRESTO) 49-51 MG Take 1 tablet by mouth 2 (two) times daily.  Marland Kitchen spironolactone (ALDACTONE) 50 MG tablet Take 1 tablet (50 mg total) by mouth daily.  Marland Kitchen zolpidem (AMBIEN) 5 MG tablet TK 1 T PO QHS PRF INSOMNIA  . [DISCONTINUED] amLODipine (NORVASC) 5 MG tablet TAKE 1 TABLET BY MOUTH EVERY DAY  . [DISCONTINUED] atorvastatin (LIPITOR) 40 MG tablet TAKE 1 TABLET BY MOUTH EVERY DAY AT 6PM  . [DISCONTINUED] carvedilol (COREG) 12.5 MG tablet TAKE 1 TABLET BY MOUTH TWICE DAILY WITH MEALS  . [DISCONTINUED] sacubitril-valsartan (ENTRESTO) 49-51 MG Take 1 tablet by mouth 2 (two) times daily.  . [DISCONTINUED] spironolactone (ALDACTONE) 50 MG tablet TAKE 1 TABLET BY MOUTH DAILY     Allergies:   Latex   Social History   Tobacco Use  . Smoking status: Never Smoker  .  Smokeless tobacco: Never Used  Substance Use Topics  . Alcohol use: Yes    Alcohol/week: 7.0 - 14.0 standard drinks    Types: 7 - 14 Cans of beer per week    Comment: social-daily beers  . Drug use: No     Family Hx: The patient's family history includes Cerebral aneurysm (age of onset: 58) in her mother; Coronary artery disease in her unknown relative; Heart attack (age of onset: 48) in her father; Hypertension in her brother and mother. There is no history of Colon cancer or Breast cancer.  ROS:   Please see the history of present illness.    Otherwise as stated in the HPI and negative for all other systems.   Labs/Other Tests and Data Reviewed:     Recent Labs: No results found for requested labs within last 8760 hours.   Recent Lipid Panel Lab Results  Component Value Date/Time   CHOL 211 (H) 08/11/2015 09:05 AM   TRIG 68 08/11/2015 09:05 AM   HDL 66 08/11/2015 09:05 AM   CHOLHDL 3.2 08/11/2015 09:05 AM   LDLCALC 131 (H) 08/11/2015 09:05 AM    Wt Readings from Last 3 Encounters:  02/05/19 203 lb (92.1 kg)  02/03/18 214 lb 12.8 oz (97.4 kg)  12/18/16 217 lb 3.2 oz (98.5 kg)     Objective:    Vital Signs:  BP 130/90   Ht 5\' 7"  (1.702 m)   Wt 203 lb (92.1 kg)   BMI 31.79 kg/m    Well nourished, well developed female in no acute distress.   ASSESSMENT & PLAN:    NONISCHEMIC CARDIOMYOPATHY:  Her ejection fraction was as above.    She has no new symptoms.  No change in therapy is indicated.  The patient will continue with meds as listed.   HTN:  This is being managed in the context of treating his CHF .  BPs are at target  CKD:   Her Creat in Jan was 1.9.  I was able to review these labs.  This is stable compared to previous.  Follow by Velna Hatchet, MD  DYSLIPIDEMIA:   LDL was 126 in Sept of last year.  This is better than previous.  No change in therapy. I would suggest diet change and repeat lipid and liver in 4 months.  If not improved increase the Lipitor to 80 mg po daily.    COVID-19 Education: The signs and symptoms of COVID-19 were discussed with the patient and how to seek care for testing (follow up with PCP or arrange E-visit).  The importance of social distancing was discussed today.  Patient Risk:   After full review of this patient's clinical status, I feel that they are at least moderate risk at this time.  Time:   Today, I have spent 20 minutes with the patient with telehealth technology discussing [facility] and Covid.     Medication Adjustments/Labs and Tests Ordered: Current medicines are reviewed at length with the patient today.  Concerns regarding medicines are outlined above.  Tests  Ordered: No orders of the defined types were placed in this encounter.  Medication Changes: Meds ordered this encounter  Medications  . amLODipine (NORVASC) 5 MG tablet    Sig: Take 1 tablet (5 mg total) by mouth daily.    Dispense:  90 tablet    Refill:  3  . atorvastatin (LIPITOR) 40 MG tablet    Sig: Take 1 tablet (40 mg total) by mouth daily at 6  PM.    Dispense:  90 tablet    Refill:  3  . sacubitril-valsartan (ENTRESTO) 49-51 MG    Sig: Take 1 tablet by mouth 2 (two) times daily.    Dispense:  180 tablet    Refill:  3    Please Daleen Bo [id number] RXGRP [id number] RXCPN loyalty ISSUER [phone] [id number]  . spironolactone (ALDACTONE) 50 MG tablet    Sig: Take 1 tablet (50 mg total) by mouth daily.    Dispense:  90 tablet    Refill:  3  . carvedilol (COREG) 12.5 MG tablet    Sig: Take 1 tablet (12.5 mg total) by mouth 2 (two) times daily with a meal.    Dispense:  180 tablet    Refill:  3    Disposition:  Follow up 12 months  Signed, Minus Breeding, MD  02/05/2019 11:38 AM    [place]

## 2019-02-05 NOTE — Patient Instructions (Addendum)
Medication Instructions:  Continue current medications  If you need a refill on your cardiac medications before your next appointment, please call your pharmacy.  Labwork: None Ordered   Testing/Procedures: None Ordered  Follow-Up: You will need a follow up appointment in 1 Year.  Please call our office 2 months in advance to schedule this appointment.  You may see Dr Hochrein or one of the following Advanced Practice Providers on your designated Care Team:   Rhonda Barrett, PA-C . Kathryn Lawrence, DNP, ANP   At [facility], you and your health needs are our priority.  As part of our continuing mission to provide you with exceptional heart care, we have created designated Provider Care Teams.  These Care Teams include your primary Cardiologist (physician) and Advanced Practice Providers (APPs -  Physician Assistants and Nurse Practitioners) who all work together to provide you with the care you need, when you need it.  Thank you for choosing [facility]!!     

## 2019-02-08 NOTE — Telephone Encounter (Signed)
Visit completed.

## 2019-09-20 ENCOUNTER — Other Ambulatory Visit: Payer: Self-pay

## 2019-09-20 ENCOUNTER — Ambulatory Visit (HOSPITAL_COMMUNITY)
Admission: EM | Admit: 2019-09-20 | Discharge: 2019-09-20 | Disposition: A | Discharge status: Home / Self Care | Payer: Managed Care, Other (non HMO) | Attending: Family Medicine | Admitting: Family Medicine

## 2019-09-20 ENCOUNTER — Encounter (HOSPITAL_COMMUNITY): Payer: Self-pay | Admitting: Emergency Medicine

## 2019-09-20 DIAGNOSIS — M5431 Sciatica, right side: Secondary | ICD-10-CM

## 2019-09-20 MED ORDER — PREDNISONE 10 MG PO TABS: 20.0000 mg | ORAL_TABLET | Freq: Every day | ORAL | 0 refills | Status: DC

## 2019-09-20 NOTE — ED Provider Notes (Signed)
[place]    CSN: [id number] Arrival date & time: 09/20/19  1708      History   Chief Complaint Chief Complaint  Patient presents with  . Back Pain    HPI [name] is a 58 y.o. female.   Patient has history of sciatica.  Last flareup was earlier this year.  2 days ago patient tried to catch her daughter from falling down some steps and felt pain in her right hip in the area of the sciatic nerve.  HPI  Past Medical History:  Diagnosis Date  . CHF (congestive heart failure) ([place])   . Hypertension   . Overweight(278.02)   . Sciatica   . Secondary cardiomyopathy, unspecified    EF now 45%    Patient Active Problem List   Diagnosis Date Noted  . Hyperlipidemia 02/05/2019  . Advice given about COVID-19 virus infection 02/05/2019  . CKD (chronic kidney disease), stage III 02/03/2018  . Insomnia 10/14/2014  . At risk for sudden cardiac death 2014-09-10  . Chronic systolic HF (heart failure) ([place]) 2014-09-10  . Essential hypertension, malignant 08/08/2014  . Non compliance with medical treatment 08/07/2014  . Normal coronary arteries 2006 08/07/2014  . Renal insufficiency- unclear if chronic or not 08/07/2014  . Acute on chronic systolic CHF (congestive heart failure), NYHA class 4 ([place]) 08/06/2014  . DYSPNEA 07/04/2010  . Chest pain- MI r/o 05/24/2009  . Obesity (BMI 30-39.9) 02/13/2009  . Uncontrolled hypertension 02/13/2009  . NICM- EF 25-30% echo 08/05/14 02/13/2009    Past Surgical History:  Procedure Laterality Date  . ABDOMINAL HYSTERECTOMY      OB History   No obstetric history on file.      Home Medications    Prior to Admission medications   Medication Sig Start Date End Date Taking? Authorizing Provider  acetaminophen (TYLENOL) 325 MG tablet Take 2 tablets (650 mg total) by mouth every 4 (four) hours as needed for headache or mild pain. 08/09/14  Yes Kilroy, Luke K, PA-C  amLODipine (NORVASC) 5 MG tablet Take 1 tablet (5 mg total) by  mouth daily. 02/05/19  Yes Minus Breeding, MD  atorvastatin (LIPITOR) 40 MG tablet Take 1 tablet (40 mg total) by mouth daily at 6 PM. 02/05/19  Yes Minus Breeding, MD  carvedilol (COREG) 12.5 MG tablet Take 1 tablet (12.5 mg total) by mouth 2 (two) times daily with a meal. 02/05/19  Yes Hochrein, Jeneen Rinks, MD  sacubitril-valsartan (ENTRESTO) 49-51 MG Take 1 tablet by mouth 2 (two) times daily. 02/05/19  Yes Minus Breeding, MD  spironolactone (ALDACTONE) 50 MG tablet Take 1 tablet (50 mg total) by mouth daily. 02/05/19  Yes Minus Breeding, MD  zolpidem (AMBIEN) 5 MG tablet TK 1 T PO QHS PRF INSOMNIA 01/17/18   [provider]    Family History Family History  Problem Relation Age of Onset  . Cerebral aneurysm Mother 47       ruptured  . Hypertension Mother        severe  . Heart attack Father 52  . Coronary artery disease Other   . Hypertension Brother        severe  . Colon cancer Neg Hx   . Breast cancer Neg Hx     Social History Social History   Tobacco Use  . Smoking status: Never Smoker  . Smokeless tobacco: Never Used  Substance Use Topics  . Alcohol use: Yes    Alcohol/week: 7.0 - 14.0 standard drinks  Types: 7 - 14 Cans of beer per week    Comment: social-daily beers  . Drug use: No     Allergies   Latex   Review of Systems Review of Systems  Musculoskeletal: Positive for back pain and gait problem.  All other systems reviewed and are negative.    Physical Exam Triage Vital Signs ED Triage Vitals  Enc Vitals Group     BP 09/20/19 1844 (!) 138/99     Pulse Rate 09/20/19 1844 85     Resp 09/20/19 1844 18     Temp 09/20/19 1844 98.6 F (37 C)     Temp Source 09/20/19 1844 Oral     SpO2 09/20/19 1844 100 %     Weight --      Height --      Head Circumference --      Peak Flow --      Pain Score 09/20/19 1840 8     Pain Loc --      Pain Edu? --      Excl. in [place]? --    No data found.  Updated Vital Signs BP (!) 138/99 (BP Location: Right Arm)    Pulse 85   Temp 98.6 F (37 C) (Oral)   Resp 18   SpO2 100%   Visual Acuity Right Eye Distance:   Left Eye Distance:   Bilateral Distance:    Right Eye Near:   Left Eye Near:    Bilateral Near:     Physical Exam Vitals signs and nursing note reviewed.  Constitutional:      Appearance: Normal appearance. She is obese.  Cardiovascular:     Rate and Rhythm: Normal rate and regular rhythm.  Pulmonary:     Effort: Pulmonary effort is normal.  Musculoskeletal:     Comments: Back: There is tenderness over the sciatic notch on the right side.  Straight leg raising is equivocal on that side Reflexes are symmetric at knees and ankles Normal range of motion  Neurological:     Mental Status: She is alert.      UC Treatments / Results  Labs (all labs ordered are listed, but only abnormal results are displayed) Labs Reviewed - No data to display  EKG   Radiology No results found.  Procedures Procedures (including critical care time)  Medications Ordered in UC Medications - No data to display  Initial Impression / Assessment and Plan / UC Course  I have reviewed the triage vital signs and the nursing notes.  Pertinent labs & imaging results that were available during my care of the patient were reviewed by me and considered in my medical decision making (see chart for details).     Sciatica flare. Final Clinical Impressions(s) / UC Diagnoses   Final diagnoses:  None   Discharge Instructions   None    ED Prescriptions    None     PDMP not reviewed this encounter.   Wardell Honour, MD 09/20/19 [id number]

## 2019-09-20 NOTE — ED Triage Notes (Signed)
Patient tried to assist her daughter from a fall, and in trying to help daughter get up, aggravated her sciatica.    Right lower back pain.  Patient has been taking tylenol.  No radiating into buttocks or legs.

## 2019-09-23 ENCOUNTER — Ambulatory Visit (HOSPITAL_COMMUNITY)
Admission: EM | Admit: 2019-09-23 | Discharge: 2019-09-23 | Disposition: A | Discharge status: Home / Self Care | Payer: Managed Care, Other (non HMO) | Attending: Family Medicine | Admitting: Family Medicine

## 2019-09-23 ENCOUNTER — Encounter (HOSPITAL_COMMUNITY): Payer: Self-pay

## 2019-09-23 ENCOUNTER — Ambulatory Visit (INDEPENDENT_AMBULATORY_CARE_PROVIDER_SITE_OTHER): Payer: Managed Care, Other (non HMO)

## 2019-09-23 ENCOUNTER — Other Ambulatory Visit: Payer: Self-pay

## 2019-09-23 DIAGNOSIS — M7042 Prepatellar bursitis, left knee: Secondary | ICD-10-CM | POA: Diagnosis not present

## 2019-09-23 MED ORDER — PREDNISONE 10 MG (21) PO TBPK: ORAL_TABLET | ORAL | 0 refills | Status: DC

## 2019-09-23 NOTE — Discharge Instructions (Addendum)
I believe that this is bursitis We will treat this with prednisone and have you rest, ice the area and elevate Ace wrap for swelling.  Follow up as needed for continued or worsening symptoms

## 2019-09-23 NOTE — ED Triage Notes (Addendum)
Pt states she has swelling in her left knee. Pt states this started yesterday. Pt tried taking tylenol and warm compress and nothing is working.

## 2019-09-23 NOTE — ED Provider Notes (Signed)
[place]    CSN: [id number] Arrival date & time: 09/23/19  1053      History   Chief Complaint Chief Complaint  Patient presents with  . Knee Pain    HPI [name] is a 58 y.o. female.   Patient is a 58 year old female with past medical history of CHF, hypertension, sciatica, cardiomyopathy, obesity, CKD stage III.  Patient presents today with left knee pain, swelling.  Symptoms have been constant and worsening since yesterday evening.  Started after getting off work.  She does a lot of walking and standing at work.  Denies any injuries to the knee.  Went home last night after work and rested, elevate the knee and put warm compress.  Took Tylenol without much relief.  No fevers.no hx of gout  ROS per HPI      Past Medical History:  Diagnosis Date  . CHF (congestive heart failure) ([place])   . Hypertension   . Overweight(278.02)   . Sciatica   . Secondary cardiomyopathy, unspecified    EF now 45%    Patient Active Problem List   Diagnosis Date Noted  . Hyperlipidemia 02/05/2019  . Advice given about COVID-19 virus infection 02/05/2019  . CKD (chronic kidney disease), stage III 02/03/2018  . Insomnia 10/14/2014  . At risk for sudden cardiac death 2014/08/23  . Chronic systolic HF (heart failure) ([place]) Aug 23, 2014  . Essential hypertension, malignant 08/08/2014  . Non compliance with medical treatment 08/07/2014  . Normal coronary arteries 2006 08/07/2014  . Renal insufficiency- unclear if chronic or not 08/07/2014  . Acute on chronic systolic CHF (congestive heart failure), NYHA class 4 ([place]) 08/06/2014  . DYSPNEA 07/04/2010  . Chest pain- MI r/o 05/24/2009  . Obesity (BMI 30-39.9) 02/13/2009  . Uncontrolled hypertension 02/13/2009  . NICM- EF 25-30% echo 08/05/14 02/13/2009    Past Surgical History:  Procedure Laterality Date  . ABDOMINAL HYSTERECTOMY      OB History   No obstetric history on file.      Home Medications    Prior to  Admission medications   Medication Sig Start Date End Date Taking? Authorizing Provider  acetaminophen (TYLENOL) 325 MG tablet Take 2 tablets (650 mg total) by mouth every 4 (four) hours as needed for headache or mild pain. 08/09/14   Erlene Quan, PA-C  amLODipine (NORVASC) 5 MG tablet Take 1 tablet (5 mg total) by mouth daily. 02/05/19   Minus Breeding, MD  atorvastatin (LIPITOR) 40 MG tablet Take 1 tablet (40 mg total) by mouth daily at 6 PM. 02/05/19   Minus Breeding, MD  carvedilol (COREG) 12.5 MG tablet Take 1 tablet (12.5 mg total) by mouth 2 (two) times daily with a meal. 02/05/19   Minus Breeding, MD  predniSONE (STERAPRED UNI-PAK 21 TAB) 10 MG (21) TBPK tablet 6 tabs for 1 day, then 5 tabs for 1 das, then 4 tabs for 1 day, then 3 tabs for 1 day, 2 tabs for 1 day, then 1 tab for 1 day 09/23/19   Loura Halt A, NP  sacubitril-valsartan (ENTRESTO) 49-51 MG Take 1 tablet by mouth 2 (two) times daily. 02/05/19   Minus Breeding, MD  spironolactone (ALDACTONE) 50 MG tablet Take 1 tablet (50 mg total) by mouth daily. 02/05/19   Minus Breeding, MD  zolpidem (AMBIEN) 5 MG tablet TK 1 T PO QHS PRF INSOMNIA 01/17/18   [provider]    Family History Family History  Problem Relation Age of Onset  .  Cerebral aneurysm Mother 59       ruptured  . Hypertension Mother        severe  . Heart attack Father 86  . Coronary artery disease Other   . Hypertension Brother        severe  . Colon cancer Neg Hx   . Breast cancer Neg Hx     Social History Social History   Tobacco Use  . Smoking status: Never Smoker  . Smokeless tobacco: Never Used  Substance Use Topics  . Alcohol use: Yes    Alcohol/week: 7.0 - 14.0 standard drinks    Types: 7 - 14 Cans of beer per week    Comment: social-daily beers  . Drug use: No     Allergies   Latex   Review of Systems Review of Systems   Physical Exam Triage Vital Signs ED Triage Vitals [09/23/19 1133]  Enc Vitals Group     BP (!) 143/96      Pulse Rate 94     Resp 18     Temp 98.7 F (37.1 C)     Temp Source Oral     SpO2 99 %     Weight 206 lb (93.4 kg)     Height      Head Circumference      Peak Flow      Pain Score 10     Pain Loc      Pain Edu?      Excl. in [place]?    No data found.  Updated Vital Signs BP (!) 143/96 (BP Location: Right Arm)   Pulse 94   Temp 98.7 F (37.1 C) (Oral)   Resp 18   Wt 206 lb (93.4 kg)   SpO2 99%   BMI 32.26 kg/m   Visual Acuity Right Eye Distance:   Left Eye Distance:   Bilateral Distance:    Right Eye Near:   Left Eye Near:    Bilateral Near:     Physical Exam Vitals signs and nursing note reviewed.  Constitutional:      General: She is not in acute distress.    Appearance: Normal appearance. She is not ill-appearing, toxic-appearing or diaphoretic.  HENT:     Head: Normocephalic.     Nose: Nose normal.     Mouth/Throat:     Pharynx: Oropharynx is clear.  Eyes:     Conjunctiva/sclera: Conjunctivae normal.  Neck:     Musculoskeletal: Normal range of motion.  Pulmonary:     Effort: Pulmonary effort is normal.  Abdominal:     Tenderness: There is no abdominal tenderness.  Musculoskeletal:     Left knee: She exhibits decreased range of motion, swelling, effusion, abnormal patellar mobility and bony tenderness. She exhibits no ecchymosis, no deformity, no laceration, no erythema, normal alignment and no LCL laxity. Tenderness found. Patellar tendon tenderness noted.  Skin:    General: Skin is warm and dry.     Findings: No rash.  Neurological:     Mental Status: She is alert.  Psychiatric:        Mood and Affect: Mood normal.      UC Treatments / Results  Labs (all labs ordered are listed, but only abnormal results are displayed) Labs Reviewed - No data to display  EKG   Radiology Dg Knee Complete 4 Views Left  Result Date: 09/23/2019 CLINICAL DATA:  58 year old female with left knee pain and swelling. No known injury. EXAM: LEFT KNEE -  COMPLETE 4+ VIEW COMPARISON:  None. FINDINGS: There is no acute fracture or dislocation. The bones are well mineralized. No arthritic changes. Small suprapatellar effusion may be present. The soft tissues are unremarkable. IMPRESSION: 1. No acute fracture or dislocation. 2. Possible small suprapatellar effusion. Electronically Signed   By: Anner Crete M.D.   On: 09/23/2019 13:42    Procedures Procedures (including critical care time)  Medications Ordered in UC Medications - No data to display  Initial Impression / Assessment and Plan / UC Course  I have reviewed the triage vital signs and the nursing notes.  Pertinent labs & imaging results that were available during my care of the patient were reviewed by me and considered in my medical decision making (see chart for details).     Bursitis-most likely due to swelling, location of pain and possible suprapatellar effusion on x-ray. No acute bony abnormality. We will treat with prednisone taper over the next 6 days.  Ace wrap placed here in clinic for compression. Recommended ice, elevate Follow up as needed for continued or worsening symptoms  Final Clinical Impressions(s) / UC Diagnoses   Final diagnoses:  Prepatellar bursitis of left knee     Discharge Instructions     I believe that this is bursitis We will treat this with prednisone and have you rest, ice the area and elevate Ace wrap for swelling.  Follow up as needed for continued or worsening symptoms     ED Prescriptions    Medication Sig Dispense Auth. Provider   predniSONE (STERAPRED UNI-PAK 21 TAB) 10 MG (21) TBPK tablet 6 tabs for 1 day, then 5 tabs for 1 das, then 4 tabs for 1 day, then 3 tabs for 1 day, 2 tabs for 1 day, then 1 tab for 1 day 21 tablet Keelyn Fjelstad A, NP     PDMP not reviewed this encounter.   Orvan July, NP 09/23/19 1429

## 2019-11-12 ENCOUNTER — Telehealth: Payer: Self-pay | Admitting: Cardiology

## 2019-11-12 NOTE — Telephone Encounter (Signed)
We are recommending the COVID-19 vaccine to all of our patients. Cardiac medications (including blood thinners) should not deter anyone from being vaccinated and there is no need to hold any of those medications prior to vaccine administration.     Currently, there is a hotline to call (active 11/12/19) to schedule vaccination appointments as no walk-ins will be accepted.   Number: [phone]    If you have further questions or concerns about the vaccine process, please visit [email or web address] or contact your primary care physician.   I have informed patient of the instructions.

## 2020-01-20 ENCOUNTER — Other Ambulatory Visit: Payer: Self-pay | Admitting: Internal Medicine

## 2020-01-20 DIAGNOSIS — Z1231 Encounter for screening mammogram for malignant neoplasm of breast: Secondary | ICD-10-CM

## 2020-02-15 ENCOUNTER — Other Ambulatory Visit: Payer: Self-pay | Admitting: Cardiology

## 2020-02-15 DIAGNOSIS — E785 Hyperlipidemia, unspecified: Secondary | ICD-10-CM

## 2020-02-15 MED ORDER — CARVEDILOL 12.5 MG PO TABS: 12.5000 mg | ORAL_TABLET | Freq: Two times a day (BID) | ORAL | 0 refills | Status: DC

## 2020-02-15 MED ORDER — SACUBITRIL-VALSARTAN 49-51 MG PO TABS: 1.0000 | ORAL_TABLET | Freq: Two times a day (BID) | ORAL | 0 refills | Status: DC

## 2020-02-15 MED ORDER — ATORVASTATIN CALCIUM 40 MG PO TABS: 40.0000 mg | ORAL_TABLET | Freq: Every day | ORAL | 0 refills | Status: DC

## 2020-02-15 MED ORDER — SPIRONOLACTONE 50 MG PO TABS: 50.0000 mg | ORAL_TABLET | Freq: Every day | ORAL | 0 refills | Status: DC

## 2020-02-15 MED ORDER — AMLODIPINE BESYLATE 5 MG PO TABS: 5.0000 mg | ORAL_TABLET | Freq: Every day | ORAL | 0 refills | Status: DC

## 2020-02-15 NOTE — Telephone Encounter (Signed)
*  STAT* If patient is at the pharmacy, call can be transferred to refill team.   1. Which medications need to be refilled? (please list name of each medication and dose if known)  amLODipine (NORVASC) 5 MG tablet spironolactone (ALDACTONE) 50 MG tablet atorvastatin (LIPITOR) 40 MG tablet atorvastatin (LIPITOR) 40 MG tablet sacubitril-valsartan (ENTRESTO) 49-51 MG  2. Which pharmacy/location (including street and city if local pharmacy) is medication to be sent to? [facility], [place]  3. Do they need a 30 day or 90 day supply? 90 day  Patient is out of medication.

## 2020-05-21 ENCOUNTER — Other Ambulatory Visit: Payer: Self-pay | Admitting: Cardiology

## 2020-05-21 DIAGNOSIS — E785 Hyperlipidemia, unspecified: Secondary | ICD-10-CM

## 2020-06-04 ENCOUNTER — Other Ambulatory Visit: Payer: Self-pay

## 2020-06-04 ENCOUNTER — Encounter (HOSPITAL_COMMUNITY): Payer: Self-pay

## 2020-06-04 ENCOUNTER — Ambulatory Visit (HOSPITAL_COMMUNITY)
Admission: EM | Admit: 2020-06-04 | Discharge: 2020-06-04 | Disposition: A | Discharge status: Home / Self Care | Payer: Managed Care, Other (non HMO)

## 2020-06-04 DIAGNOSIS — M722 Plantar fascial fibromatosis: Secondary | ICD-10-CM

## 2020-06-04 DIAGNOSIS — M79671 Pain in right foot: Secondary | ICD-10-CM

## 2020-06-04 MED ORDER — PREDNISONE 10 MG PO TABS: 30.0000 mg | ORAL_TABLET | Freq: Every day | ORAL | 0 refills | Status: DC

## 2020-06-04 NOTE — ED Triage Notes (Signed)
Pt c/o right foot pain at heel and achilles tendon area. Denies injury/trauma. Pain increases with ambulation.  Has been using tylenol and ibuprofen for pain, last taken yesterday.

## 2020-06-04 NOTE — ED Provider Notes (Signed)
[facility]   MRN: [id number] DOB: January 15, 1961  Subjective:   [name] is a 59 y.o. female presenting for acute on chronic right heel pain that extends upward towards the back of her heel and slightly into her Achilles tendon.  Patient has a history of plantar fasciitis of said foot, has not taken any oral medications for pain relief.  Unfortunately, her work requires that she stand and walk for the entirety of her shift.  Does not have shoe inserts.  Has a history of CHF but is doing well, gets regular follow-up with a cardiologist, last ejection fraction was 50 to 55% in 2019.  Has a history of CKD stage III, nonischemic cardiomyopathy.  No current facility-administered medications for this encounter.  Current Outpatient Medications:  .  amLODipine (NORVASC) 5 MG tablet, Take 1 tablet (5 mg total) by mouth daily. Call and schedule follow up office visit to receive further refills. Thank you., Disp: 30 tablet, Rfl: 0 .  atorvastatin (LIPITOR) 40 MG tablet, Take 1 tablet (40 mg total) by mouth daily. Call and schedule follow up office visit to receive further refills. Thank you., Disp: 30 tablet, Rfl: 0 .  carvedilol (COREG) 12.5 MG tablet, Take 1 tablet (12.5 mg total) by mouth 2 (two) times daily with a meal. Call and schedule follow up office visit to receive further refills. Thank you., Disp: 60 tablet, Rfl: 0 .  ENTRESTO 49-51 MG, TAKE 1 TABLET BY MOUTH TWICE DAILY, SCHEDULE OV FOR FURTHER REFILLS, Disp: 60 tablet, Rfl: 0 .  HYDROcodone-acetaminophen (NORCO/VICODIN) 5-325 MG tablet, Take 1 tablet by mouth every 6 (six) hours as needed., Disp: , Rfl:  .  spironolactone (ALDACTONE) 50 MG tablet, TAKE 1 TABLET BY MOUTH DAILY, SCHEDULE OV FOR FURTHER REFILLS, Disp: 30 tablet, Rfl: 0 .  zolpidem (AMBIEN) 5 MG tablet, TK 1 T PO QHS PRF INSOMNIA, Disp: , Rfl: 1 .  acetaminophen (TYLENOL) 325 MG tablet, Take 2 tablets (650 mg total) by mouth every 4 (four) hours as needed for headache or  mild pain., Disp: , Rfl:  .  predniSONE (STERAPRED UNI-PAK 21 TAB) 10 MG (21) TBPK tablet, 6 tabs for 1 day, then 5 tabs for 1 das, then 4 tabs for 1 day, then 3 tabs for 1 day, 2 tabs for 1 day, then 1 tab for 1 day, Disp: 21 tablet, Rfl: 0   Allergies  Allergen Reactions  . Latex Itching    Past Medical History:  Diagnosis Date  . CHF (congestive heart failure) ([place])   . Hypertension   . Overweight(278.02)   . Sciatica   . Secondary cardiomyopathy, unspecified    EF now 45%     Past Surgical History:  Procedure Laterality Date  . ABDOMINAL HYSTERECTOMY      Family History  Problem Relation Age of Onset  . Cerebral aneurysm Mother 15       ruptured  . Hypertension Mother        severe  . Heart attack Father 8  . Coronary artery disease Other   . Hypertension Brother        severe  . Colon cancer Neg Hx   . Breast cancer Neg Hx     Social History   Tobacco Use  . Smoking status: Never Smoker  . Smokeless tobacco: Never Used  Substance Use Topics  . Alcohol use: Yes    Alcohol/week: 7.0 - 14.0 standard drinks    Types: 7 - 14 Cans of beer  per week    Comment: social-daily beers  . Drug use: No    ROS   Objective:   Vitals: BP (!) 151/102 (BP Location: Left Arm)   Pulse 86   Temp 99.1 F (37.3 C) (Oral)   Resp 18   SpO2 98%   Physical Exam Constitutional:      General: She is not in acute distress.    Appearance: Normal appearance. She is well-developed. She is obese. She is not ill-appearing, toxic-appearing or diaphoretic.  HENT:     Head: Normocephalic and atraumatic.     Nose: Nose normal.     Mouth/Throat:     Mouth: Mucous membranes are moist.     Pharynx: Oropharynx is clear.  Eyes:     General: No scleral icterus.       Right eye: No discharge.        Left eye: No discharge.     Extraocular Movements: Extraocular movements intact.     Conjunctiva/sclera: Conjunctivae normal.     Pupils: Pupils are equal, round, and reactive to  light.  Cardiovascular:     Rate and Rhythm: Normal rate.  Pulmonary:     Effort: Pulmonary effort is normal.  Musculoskeletal:     Right ankle:     Right Achilles Tendon: No tenderness or defects. Thompson's test negative.       Feet:  Skin:    General: Skin is warm and dry.  Neurological:     General: No focal deficit present.     Mental Status: She is alert and oriented to person, place, and time.  Psychiatric:        Mood and Affect: Mood normal.        Behavior: Behavior normal.        Thought Content: Thought content normal.        Judgment: Judgment normal.       Assessment and Plan :   PDMP not reviewed this encounter.  1. Plantar fasciitis of right foot   2. Foot pain, right    Counseled on general management of plantar fasciitis, given that she is not a good candidate to use NSAIDs, will use prednisone at 30 mg once daily for 5 days.  Recommended she use shoe inserts, gel pads and ice water baths when she gets home from her work shifts. Counseled patient on potential for adverse effects with medications prescribed/recommended today, ER and return-to-clinic precautions discussed, patient verbalized understanding.    Jaynee Eagles, [place] 06/04/20 5093

## 2020-06-27 ENCOUNTER — Other Ambulatory Visit: Payer: Self-pay | Admitting: Cardiology

## 2020-07-02 ENCOUNTER — Ambulatory Visit (HOSPITAL_COMMUNITY)
Admission: EM | Admit: 2020-07-02 | Discharge: 2020-07-02 | Disposition: A | Discharge status: Home / Self Care | Payer: Managed Care, Other (non HMO) | Attending: Family Medicine | Admitting: Family Medicine

## 2020-07-02 ENCOUNTER — Other Ambulatory Visit: Payer: Self-pay

## 2020-07-02 ENCOUNTER — Encounter (HOSPITAL_COMMUNITY): Payer: Self-pay

## 2020-07-02 DIAGNOSIS — M722 Plantar fascial fibromatosis: Secondary | ICD-10-CM | POA: Diagnosis not present

## 2020-07-02 MED ORDER — PREDNISONE 20 MG PO TABS: 20.0000 mg | ORAL_TABLET | Freq: Two times a day (BID) | ORAL | 0 refills | Status: DC

## 2020-07-02 NOTE — Discharge Instructions (Signed)
Recommend you stretch your heels at least twice a day. Ice after prolonged standing or activity will help Take prednisone 2 times a day for 5 days Follow-up with podiatry

## 2020-07-02 NOTE — ED Triage Notes (Signed)
Pt c/o 5/10 left heel painx2 wks. Pt states was here for plantar fascitis a few weeks ago and was bearing more weight on the left foot and believes that is why she is having the pain in the left heel. No edema noted at heel. Pt limped to exam room.

## 2020-07-02 NOTE — ED Provider Notes (Signed)
[place]    CSN: [id number] Arrival date & time: 07/02/20  1055      History   Chief Complaint Chief Complaint  Patient presents with  . Foot Pain    HPI [name] is a 59 y.o. female.   HPI  Patient is a long standing walking her job.  She has had plantar fasciitis before.  She is having pain in her left heel.  She thinks she has plantar fasciitis again.  She has been trying at home to use some ice and wear her shoe inserts.  In spite of this her heel is painful.  Last time she had plantar fasciitis she got prednisone for a few days.  This worked very well for her.  She is hoping she can get prednisone again today. She does have a history of heart failure.  She is compliant with her medication.  Her vital signs stable.  No shortness of breath or chest pain  Past Medical History:  Diagnosis Date  . CHF (congestive heart failure) ([place])   . Hypertension   . Overweight(278.02)   . Sciatica   . Secondary cardiomyopathy, unspecified    EF now 45%    Patient Active Problem List   Diagnosis Date Noted  . Hyperlipidemia 02/05/2019  . Advice given about COVID-19 virus infection 02/05/2019  . CKD (chronic kidney disease), stage III 02/03/2018  . Insomnia 10/14/2014  . At risk for sudden cardiac death 09/09/14  . Chronic systolic HF (heart failure) ([place]) September 09, 2014  . Essential hypertension, malignant 08/08/2014  . Non compliance with medical treatment 08/07/2014  . Normal coronary arteries 2006 08/07/2014  . Renal insufficiency- unclear if chronic or not 08/07/2014  . Acute on chronic systolic CHF (congestive heart failure), NYHA class 4 ([place]) 08/06/2014  . DYSPNEA 07/04/2010  . Chest pain- MI r/o 05/24/2009  . Obesity (BMI 30-39.9) 02/13/2009  . Uncontrolled hypertension 02/13/2009  . NICM- EF 25-30% echo 08/05/14 02/13/2009    Past Surgical History:  Procedure Laterality Date  . ABDOMINAL HYSTERECTOMY      OB History   No obstetric history on  file.      Home Medications    Prior to Admission medications   Medication Sig Start Date End Date Taking? Authorizing Provider  acetaminophen (TYLENOL) 325 MG tablet Take 2 tablets (650 mg total) by mouth every 4 (four) hours as needed for headache or mild pain. 08/09/14   Erlene Quan, PA-C  amLODipine (NORVASC) 5 MG tablet TAKE 1 TABLET(5 MG) BY MOUTH DAILY. FOLLOW UP OFFICE VISIT TO RECEIVE FURTHER REFILLS. 06/27/20   Minus Breeding, MD  atorvastatin (LIPITOR) 40 MG tablet Take 1 tablet (40 mg total) by mouth daily. Call and schedule follow up office visit to receive further refills. Thank you. 05/23/20   Minus Breeding, MD  carvedilol (COREG) 12.5 MG tablet TAKE 1 TABLET(12.5 MG) BY MOUTH TWICE DAILY WITH A MEAL. FOLLOW UP OFFICE VISIT TO RECEIVE FURTHER REFILLS. [place] YOU 06/27/20   Minus Breeding, MD  ENTRESTO 49-51 MG TAKE 1 TABLET BY MOUTH TWICE DAILY 06/27/20   Minus Breeding, MD  HYDROcodone-acetaminophen (NORCO/VICODIN) 5-325 MG tablet Take 1 tablet by mouth every 6 (six) hours as needed. 05/31/20   [provider]  predniSONE (DELTASONE) 20 MG tablet Take 1 tablet (20 mg total) by mouth 2 (two) times daily with a meal. 07/02/20   Raylene Everts, MD  spironolactone (ALDACTONE) 50 MG tablet TAKE 1 TABLET BY MOUTH DAILY 06/27/20  Minus Breeding, MD  zolpidem (AMBIEN) 5 MG tablet TK 1 T PO QHS PRF INSOMNIA 01/17/18   [provider]    Family History Family History  Problem Relation Age of Onset  . Cerebral aneurysm Mother 72       ruptured  . Hypertension Mother        severe  . Heart attack Father 41  . Coronary artery disease Other   . Hypertension Brother        severe  . Colon cancer Neg Hx   . Breast cancer Neg Hx     Social History Social History   Tobacco Use  . Smoking status: Never Smoker  . Smokeless tobacco: Never Used  Substance Use Topics  . Alcohol use: Yes    Alcohol/week: 7.0 - 14.0 standard drinks    Types: 7 - 14 Cans of  beer per week    Comment: social-daily beers  . Drug use: No     Allergies   Latex   Review of Systems Review of Systems See HPI Physical Exam Triage Vital Signs ED Triage Vitals [07/02/20 1214]  Enc Vitals Group     BP (!) 140/95     Pulse Rate (!) 112     Resp 18     Temp 98.4 F (36.9 C)     Temp Source Oral     SpO2 99 %     Weight 205 lb (93 kg)     Height 5\' 5"  (1.651 m)     Head Circumference      Peak Flow      Pain Score 5     Pain Loc      Pain Edu?      Excl. in [place]?    No data found.  Updated Vital Signs BP (!) 140/95   Pulse (!) 112   Temp 98.4 F (36.9 C) (Oral)   Resp 18   Ht 5\' 5"  (1.651 m)   Wt 93 kg   SpO2 99%   BMI 34.11 kg/m       Physical Exam Constitutional:      General: She is not in acute distress.    Appearance: She is well-developed.     Comments: Pleasant.  Overweight  HENT:     Head: Normocephalic and atraumatic.     Mouth/Throat:     Comments: Mask is in place Eyes:     Conjunctiva/sclera: Conjunctivae normal.     Pupils: Pupils are equal, round, and reactive to light.  Cardiovascular:     Rate and Rhythm: Normal rate.  Pulmonary:     Effort: Pulmonary effort is normal. No respiratory distress.  Abdominal:     General: There is no distension.     Palpations: Abdomen is soft.  Musculoskeletal:        General: Normal range of motion.     Cervical back: Normal range of motion.       Feet:     Comments: Tenderness to palpation of the plantar fascial insertion at the heel  Skin:    General: Skin is warm and dry.     Comments: Dry skin on plantar surface of foot.  Onychomycosis of toenails  Neurological:     Mental Status: She is alert.     Gait: Gait abnormal.  Psychiatric:        Behavior: Behavior normal.      UC Treatments / Results  Labs (all labs ordered are listed, but only abnormal results  are displayed) Labs Reviewed - No data to display  EKG   Radiology No results  found.  Procedures Procedures (including critical care time)  Medications Ordered in UC Medications - No data to display  Initial Impression / Assessment and Plan / UC Course  I have reviewed the triage vital signs and the nursing notes.  Pertinent labs & imaging results that were available during my care of the patient were reviewed by me and considered in my medical decision making (see chart for details).     Discussed management.  Stretching is important.  Ice.  Anti-inflammatories.  Shoe wear.  Follow-up with podiatry for toenail and foot problems Final Clinical Impressions(s) / UC Diagnoses   Final diagnoses:  Plantar fasciitis of left foot     Discharge Instructions     Recommend you stretch your heels at least twice a day. Ice after prolonged standing or activity will help Take prednisone 2 times a day for 5 days Follow-up with podiatry   ED Prescriptions    Medication Sig Dispense Auth. Provider   predniSONE (DELTASONE) 20 MG tablet Take 1 tablet (20 mg total) by mouth 2 (two) times daily with a meal. 10 tablet Raylene Everts, MD     PDMP not reviewed this encounter.   Raylene Everts, MD 07/02/20 [phone]

## 2020-07-17 ENCOUNTER — Ambulatory Visit (INDEPENDENT_AMBULATORY_CARE_PROVIDER_SITE_OTHER): Payer: Managed Care, Other (non HMO)

## 2020-07-17 ENCOUNTER — Ambulatory Visit: Payer: Managed Care, Other (non HMO) | Admitting: Podiatry

## 2020-07-17 ENCOUNTER — Other Ambulatory Visit: Payer: Self-pay

## 2020-07-17 DIAGNOSIS — B353 Tinea pedis: Secondary | ICD-10-CM

## 2020-07-17 DIAGNOSIS — M722 Plantar fascial fibromatosis: Secondary | ICD-10-CM

## 2020-07-17 DIAGNOSIS — M7662 Achilles tendinitis, left leg: Secondary | ICD-10-CM | POA: Diagnosis not present

## 2020-07-17 MED ORDER — CLOTRIMAZOLE-BETAMETHASONE 1-0.05 % EX CREA: 1.0000 "application " | TOPICAL_CREAM | Freq: Two times a day (BID) | CUTANEOUS | 2 refills | Status: DC

## 2020-07-17 MED ORDER — MELOXICAM 15 MG PO TABS: 15.0000 mg | ORAL_TABLET | Freq: Every day | ORAL | 1 refills | Status: DC

## 2020-07-17 MED ORDER — TERBINAFINE HCL 250 MG PO TABS: 250.0000 mg | ORAL_TABLET | Freq: Every day | ORAL | 0 refills | Status: DC

## 2020-07-17 NOTE — Progress Notes (Signed)
   Subjective: 59 y.o. female presenting today as a new patient for evaluation of left heel pain.  Patient states that the pain is been ongoing for the past few months now.  She has been to the urgent care twice and she was prescribed Medrol dose packs which did help alleviate some of her symptoms.  She recently purchased OTC Dr. Felicie Morn insoles with minimal relief.  She presents today for further treatment evaluation Patient also states that she has had itching with skin flaking of the bilateral feet in between the toes consistent with findings of athlete's foot.  Patient would like to discuss treatment for athlete's foot.  She has been dealing with this for several months now as well.  She has not done anything for treatment   Past Medical History:  Diagnosis Date  . CHF (congestive heart failure) ([place])   . Hypertension   . Overweight(278.02)   . Sciatica   . Secondary cardiomyopathy, unspecified    EF now 45%     Objective: Physical Exam General: The patient is alert and oriented x3 in no acute distress.  Dermatology: Skin is warm, dry and supple bilateral lower extremities. Negative for open lesions or macerations bilateral.  Diffuse hyperkeratosis with skin peeling noted to the interdigital areas of the bilateral feet with pruritus.  Findings consistent with tinea pedis  Vascular: Dorsalis Pedis and Posterior Tibial pulses palpable bilateral.  Capillary fill time is immediate to all digits.  Neurological: Epicritic and protective threshold intact bilateral.   Musculoskeletal: Tenderness to palpation to the plantar aspect of the left heel along the plantar fascia. All other joints range of motion within normal limits bilateral.  There is also pain on palpation at the insertion of the Achilles tendon left lower extremity consistent with findings of an insertional Achilles tendinitis strength 5/5 in all groups bilateral.   Radiographic exam: Normal osseous mineralization. Joint spaces  preserved. No fracture/dislocation/boney destruction. No other soft tissue abnormalities or radiopaque foreign bodies.   Assessment: 1. Plantar fasciitis left foot 2.  Achilles tendinitis left 3.  Tinea pedis bilateral  Plan of Care:  1. Patient evaluated. Xrays reviewed.   2. Injection of 0.5cc Celestone soluspan injected into the left plantar fascia and Achilles tendon sheath left medial aspect.  3. Rx for Meloxicam 15 mg daily ordered for patient. 4.  Prescription for Lamisil turned 50 mg #30 daily 5.  Prescription for Lotrisone cream apply 2 times daily to the tinea pedis  6.  Continue wearing OTC Dr. Felicie Morn insoles  7.  Instructed patient regarding therapies and modalities at home to alleviate symptoms.  8. Return to clinic in 4 weeks.    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 Edrick Kins, DPM    [address], [place] [address]                Office [phone]  Fax [phone]

## 2020-08-22 ENCOUNTER — Ambulatory Visit: Payer: Managed Care, Other (non HMO) | Admitting: Podiatry

## 2020-09-03 ENCOUNTER — Encounter (HOSPITAL_COMMUNITY): Payer: Self-pay

## 2020-09-03 ENCOUNTER — Ambulatory Visit (HOSPITAL_COMMUNITY)
Admission: EM | Admit: 2020-09-03 | Discharge: 2020-09-03 | Disposition: A | Discharge status: Home / Self Care | Payer: Managed Care, Other (non HMO) | Attending: Emergency Medicine | Admitting: Emergency Medicine

## 2020-09-03 DIAGNOSIS — M79672 Pain in left foot: Secondary | ICD-10-CM

## 2020-09-03 MED ORDER — MELOXICAM 15 MG PO TABS: 15.0000 mg | ORAL_TABLET | Freq: Every day | ORAL | 0 refills | Status: DC

## 2020-09-03 NOTE — Discharge Instructions (Signed)
Wear supportive shoes with arch support.  Meloxicam daily, take with food.  Use of ace wrap as needed for comfort.  Please follow up with podiatry as scheduled, for long term management.

## 2020-09-03 NOTE — ED Provider Notes (Signed)
[facility]    CSN: [id number] Arrival date & time: 09/03/20  1011      History   Chief Complaint Chief Complaint  Patient presents with   Foot Pain    HPI [name] is a 59 y.o. female.   [name] presents with complaints of left foot pain. This has been ongoing for some time now, months, but worse over the past week or so. She has seen podiatry, was given two injections and course of prednisone which did help. She saw podiatry last 9/13. She was also given 30 days of meloxicam, which seemed to help. She is out of this. No new injury. She works on her feet as a Web designer. She does have dr. Zoe Lan inserts she wears in her shoes.     ROS per HPI, negative if not otherwise mentioned.       Past Medical History:  Diagnosis Date   CHF (congestive heart failure) ([place])    Hypertension    Overweight(278.02)    Sciatica    Secondary cardiomyopathy, unspecified    EF now 45%    Patient Active Problem List   Diagnosis Date Noted   Hyperlipidemia 02/05/2019   Advice given about COVID-19 virus infection 02/05/2019   CKD (chronic kidney disease), stage III ([place]) 02/03/2018   Insomnia 10/14/2014   At risk for sudden cardiac death 57/52/7782   Chronic systolic HF (heart failure) ([place]) 08/22/2014   Essential hypertension, malignant 08/08/2014   Non compliance with medical treatment 08/07/2014   Normal coronary arteries 2006 08/07/2014   Renal insufficiency- unclear if chronic or not 08/07/2014   Acute on chronic systolic CHF (congestive heart failure), NYHA class 4 ([place]) 08/06/2014   DYSPNEA 07/04/2010   Chest pain- MI r/o 05/24/2009   Obesity (BMI 30-39.9) 02/13/2009   Uncontrolled hypertension 02/13/2009   NICM- EF 25-30% echo 08/05/14 02/13/2009    Past Surgical History:  Procedure Laterality Date   ABDOMINAL HYSTERECTOMY      OB History   No obstetric history on file.      Home Medications    Prior to Admission  medications   Medication Sig Start Date End Date Taking? Authorizing Provider  acetaminophen (TYLENOL) 325 MG tablet Take 2 tablets (650 mg total) by mouth every 4 (four) hours as needed for headache or mild pain. 08/09/14   Erlene Quan, PA-C  amLODipine (NORVASC) 5 MG tablet TAKE 1 TABLET(5 MG) BY MOUTH DAILY. FOLLOW UP OFFICE VISIT TO RECEIVE FURTHER REFILLS. 06/27/20   Minus Breeding, MD  atorvastatin (LIPITOR) 40 MG tablet Take 1 tablet (40 mg total) by mouth daily. Call and schedule follow up office visit to receive further refills. Thank you. 05/23/20   Minus Breeding, MD  carvedilol (COREG) 12.5 MG tablet TAKE 1 TABLET(12.5 MG) BY MOUTH TWICE DAILY WITH A MEAL. FOLLOW UP OFFICE VISIT TO RECEIVE FURTHER REFILLS. [place] YOU 06/27/20   Minus Breeding, MD  clotrimazole-betamethasone (LOTRISONE) cream Apply 1 application topically 2 (two) times daily. 07/17/20   Daylene Katayama M, DPM  ENTRESTO 49-51 MG TAKE 1 TABLET BY MOUTH TWICE DAILY 06/27/20   Minus Breeding, MD  HYDROcodone-acetaminophen (NORCO/VICODIN) 5-325 MG tablet Take 1 tablet by mouth every 6 (six) hours as needed. 05/31/20   [provider]  meloxicam (MOBIC) 15 MG tablet Take 1 tablet (15 mg total) by mouth daily. 09/03/20   Zigmund Gottron, NP  predniSONE (DELTASONE) 20 MG tablet Take 1 tablet (20 mg total) by mouth 2 (  two) times daily with a meal. 07/02/20   Raylene Everts, MD  spironolactone (ALDACTONE) 50 MG tablet TAKE 1 TABLET BY MOUTH DAILY 06/27/20   Minus Breeding, MD  terbinafine (LAMISIL) 250 MG tablet Take 1 tablet (250 mg total) by mouth daily. 07/17/20   Edrick Kins, DPM  zolpidem (AMBIEN) 5 MG tablet TK 1 T PO QHS PRF INSOMNIA 01/17/18   [provider]    Family History Family History  Problem Relation Age of Onset   Cerebral aneurysm Mother 13       ruptured   Hypertension Mother        severe   Heart attack Father 58   Coronary artery disease Other    Hypertension Brother         severe   Colon cancer Neg Hx    Breast cancer Neg Hx     Social History Social History   Tobacco Use   Smoking status: Never Smoker   Smokeless tobacco: Never Used  Substance Use Topics   Alcohol use: Yes    Alcohol/week: 7.0 - 14.0 standard drinks    Types: 7 - 14 Cans of beer per week    Comment: social-daily beers   Drug use: No     Allergies   Latex   Review of Systems Review of Systems   Physical Exam Triage Vital Signs ED Triage Vitals  Enc Vitals Group     BP 09/03/20 1106 (!) 139/103     Pulse Rate 09/03/20 1106 (!) 111     Resp 09/03/20 1106 18     Temp 09/03/20 1106 98.4 F (36.9 C)     Temp Source 09/03/20 1106 Oral     SpO2 09/03/20 1106 100 %     Weight --      Height --      Head Circumference --      Peak Flow --      Pain Score 09/03/20 1107 8     Pain Loc --      Pain Edu? --      Excl. in [place]? --    No data found.  Updated Vital Signs BP (!) 139/103 (BP Location: Right Arm)    Pulse (!) 111    Temp 98.4 F (36.9 C) (Oral)    Resp 18    SpO2 100%   Visual Acuity Right Eye Distance:   Left Eye Distance:   Bilateral Distance:    Right Eye Near:   Left Eye Near:    Bilateral Near:     Physical Exam Constitutional:      General: She is not in acute distress.    Appearance: She is well-developed.  Cardiovascular:     Rate and Rhythm: Normal rate.  Pulmonary:     Effort: Pulmonary effort is normal.  Musculoskeletal:     Left foot: Normal range of motion.       Feet:  Feet:     Comments: Tenderness to left foot great toe MTP joint without significant redness swelling or warmth; tenderness to plantar aspect of sole of foot as well as to lateral left foot; no heel pain or arch pain ; strong pedal pulse; cap refill < 2 seconds  Skin:    General: Skin is warm and dry.  Neurological:     Mental Status: She is alert and oriented to person, place, and time.      UC Treatments / Results  Labs (all labs ordered are listed,  but only abnormal results are displayed) Labs Reviewed - No data to display  EKG   Radiology No results found.  Procedures Procedures (including critical care time)  Medications Ordered in UC Medications - No data to display  Initial Impression / Assessment and Plan / UC Course  I have reviewed the triage vital signs and the nursing notes.  Pertinent labs & imaging results that were available during my care of the patient were reviewed by me and considered in my medical decision making (see chart for details).     Appointment in 5 days with podiatry. Has had multiple prednisone courses. Opted to provide additional meloxicam today with podiatry follow up for further evaluation and management as needed. Patient verbalized understanding and agreeable to plan.    Final Clinical Impressions(s) / UC Diagnoses   Final diagnoses:  Foot pain, left     Discharge Instructions     Wear supportive shoes with arch support.  Meloxicam daily, take with food.  Use of ace wrap as needed for comfort.  Please follow up with podiatry as scheduled, for long term management.     ED Prescriptions    Medication Sig Dispense Auth. Provider   meloxicam (MOBIC) 15 MG tablet Take 1 tablet (15 mg total) by mouth daily. 20 tablet Zigmund Gottron, NP     PDMP not reviewed this encounter.   Zigmund Gottron, NP 09/03/20 1316

## 2020-09-03 NOTE — ED Triage Notes (Signed)
Pt present left foot pain, pt has a history of left foot pain. Pt states she cannot apply pressure to her feet.

## 2020-09-06 ENCOUNTER — Other Ambulatory Visit: Payer: Self-pay | Admitting: Cardiology

## 2020-09-08 ENCOUNTER — Ambulatory Visit: Payer: Managed Care, Other (non HMO) | Admitting: Podiatry

## 2020-09-08 ENCOUNTER — Other Ambulatory Visit: Payer: Self-pay

## 2020-09-08 ENCOUNTER — Encounter: Payer: Self-pay | Admitting: Podiatry

## 2020-09-08 DIAGNOSIS — M109 Gout, unspecified: Secondary | ICD-10-CM | POA: Diagnosis not present

## 2020-09-08 DIAGNOSIS — M779 Enthesopathy, unspecified: Secondary | ICD-10-CM | POA: Diagnosis not present

## 2020-09-08 NOTE — Patient Instructions (Signed)
 Gout  Gout is painful swelling of your joints. Gout is a type of arthritis. It is caused by having too much uric acid in your body. Uric acid is a chemical that is made when your body breaks down substances called purines. If your body has too much uric acid, sharp crystals can form and build up in your joints. This causes pain and swelling. Gout attacks can happen quickly and be very painful (acute gout). Over time, the attacks can affect more joints and happen more often (chronic gout). What are the causes?  Too much uric acid in your blood. This can happen because: ? Your kidneys do not remove enough uric acid from your blood. ? Your body makes too much uric acid. ? You eat too many foods that are high in purines. These foods include organ meats, some seafood, and beer.  Trauma or stress. What increases the risk?  Having a family history of gout.  Being female and middle-aged.  Being female and having gone through menopause.  Being very overweight (obese).  Drinking alcohol, especially beer.  Not having enough water in the body (being dehydrated).  Losing weight too quickly.  Having an organ transplant.  Having lead poisoning.  Taking certain medicines.  Having kidney disease.  Having a skin condition called psoriasis. What are the signs or symptoms? An attack of acute gout usually happens in just one joint. The most common place is the big toe. Attacks often start at night. Other joints that may be affected include joints of the feet, ankle, knee, fingers, wrist, or elbow. Symptoms of an attack may include:  Very bad pain.  Warmth.  Swelling.  Stiffness.  Shiny, red, or purple skin.  Tenderness. The affected joint may be very painful to touch.  Chills and fever. Chronic gout may cause symptoms more often. More joints may be involved. You may also have white or yellow lumps (tophi) on your hands or feet or in other areas near your joints. How is this  treated?  Treatment for this condition has two phases: treating an acute attack and preventing future attacks.  Acute gout treatment may include: ? NSAIDs. ? Steroids. These are taken by mouth or injected into a joint. ? Colchicine. This medicine relieves pain and swelling. It can be given by mouth or through an IV tube.  Preventive treatment may include: ? Taking small doses of NSAIDs or colchicine daily. ? Using a medicine that reduces uric acid levels in your blood. ? Making changes to your diet. You may need to see a food expert (dietitian) about what to eat and drink to prevent gout. Follow these instructions at home: During a gout attack   If told, put ice on the painful area: ? Put ice in a plastic bag. ? Place a towel between your skin and the bag. ? Leave the ice on for 20 minutes, 2-3 times a day.  Raise (elevate) the painful joint above the level of your heart as often as you can.  Rest the joint as much as possible. If the joint is in your leg, you may be given crutches.  Follow instructions from your doctor about what you cannot eat or drink. Avoiding future gout attacks  Eat a low-purine diet. Avoid foods and drinks such as: ? Liver. ? Kidney. ? Anchovies. ? Asparagus. ? Herring. ? Mushrooms. ? Mussels. ? Beer.  Stay at a healthy weight. If you want to lose weight, talk with your doctor. Do not lose   weight too fast.  Start or continue an exercise plan as told by your doctor. Eating and drinking  Drink enough fluids to keep your pee (urine) pale yellow.  If you drink alcohol: ? Limit how much you use to:  0-1 drink a day for women.  0-2 drinks a day for men. ? Be aware of how much alcohol is in your drink. In the [place] one drink equals one 12 oz bottle of beer (355 mL), one 5 oz glass of wine (148 mL), or one 1 oz glass of hard liquor (44 mL). General instructions  Take over-the-counter and prescription medicines only as told by your doctor.  Do  not drive or use heavy machinery while taking prescription pain medicine.  Return to your normal activities as told by your doctor. Ask your doctor what activities are safe for you.  Keep all follow-up visits as told by your doctor. This is important. Contact a doctor if:  You have another gout attack.  You still have symptoms of a gout attack after 10 days of treatment.  You have problems (side effects) because of your medicines.  You have chills or a fever.  You have burning pain when you pee (urinate).  You have pain in your lower back or belly. Get help right away if:  You have very bad pain.  Your pain cannot be controlled.  You cannot pee. Summary  Gout is painful swelling of the joints.  The most common site of pain is the big toe, but it can affect other joints.  Medicines and avoiding some foods can help to prevent and treat gout attacks. This information is not intended to replace advice given to you by your health care provider. Make sure you discuss any questions you have with your health care provider. Document Revised: 05/13/2018 Document Reviewed: 05/13/2018 Elsevier Patient Education  2020 [employer] Inc[employer]  

## 2020-09-13 NOTE — Progress Notes (Signed)
Subjective:   Patient ID: [name], female   DOB: 59 y.o.   MRN: [id number]   HPI Patient presents stating the back of her heel is still sore and states that she has some improvement but still is having problems.  Patient also is complaining of swelling of her left foot   ROS      Objective:  Physical Exam  Neurovascular status intact with patient's posterior heel still showing discomfort upon palpation with swelling still noted at the insertion of the tendon into the posterior heel region.  With swelling of the left foot which is occurring in the midfoot     Assessment:  Inflammatory Achilles tendinitis improved but still painful with also possibility of gout secondary diagnosis     Plan:  H&P reviewed condition.  At this point I have recommended anti-inflammatories and we discussed gout and I wrote out gout diet for this patient educating her on this condition.  At this point patient will be seen back for recheck in the next 6 weeks and will try to change her diet try to work on reduced stress on the area.  Spent a great deal of time educating her on condition

## 2020-09-14 ENCOUNTER — Other Ambulatory Visit: Payer: Self-pay | Admitting: Cardiology

## 2020-10-12 ENCOUNTER — Other Ambulatory Visit: Payer: Self-pay | Admitting: Cardiology

## 2020-10-12 DIAGNOSIS — E785 Hyperlipidemia, unspecified: Secondary | ICD-10-CM

## 2020-11-02 ENCOUNTER — Other Ambulatory Visit: Payer: Self-pay | Admitting: Cardiology

## 2020-11-02 DIAGNOSIS — E785 Hyperlipidemia, unspecified: Secondary | ICD-10-CM

## 2020-11-17 ENCOUNTER — Other Ambulatory Visit: Payer: Self-pay | Admitting: Cardiology

## 2020-12-24 ENCOUNTER — Other Ambulatory Visit: Payer: Self-pay | Admitting: Cardiology

## 2020-12-27 ENCOUNTER — Other Ambulatory Visit: Payer: Self-pay

## 2020-12-27 ENCOUNTER — Encounter (HOSPITAL_COMMUNITY): Payer: Self-pay | Admitting: *Deleted

## 2020-12-27 ENCOUNTER — Ambulatory Visit (HOSPITAL_COMMUNITY): Payer: Managed Care, Other (non HMO)

## 2020-12-27 ENCOUNTER — Ambulatory Visit (HOSPITAL_COMMUNITY)
Admission: EM | Admit: 2020-12-27 | Discharge: 2020-12-27 | Disposition: A | Discharge status: Home / Self Care | Payer: Managed Care, Other (non HMO) | Attending: Urgent Care | Admitting: Urgent Care

## 2020-12-27 ENCOUNTER — Ambulatory Visit (INDEPENDENT_AMBULATORY_CARE_PROVIDER_SITE_OTHER): Payer: Managed Care, Other (non HMO)

## 2020-12-27 DIAGNOSIS — S8002XA Contusion of left knee, initial encounter: Secondary | ICD-10-CM

## 2020-12-27 DIAGNOSIS — S8992XA Unspecified injury of left lower leg, initial encounter: Secondary | ICD-10-CM

## 2020-12-27 DIAGNOSIS — M25562 Pain in left knee: Secondary | ICD-10-CM | POA: Diagnosis not present

## 2020-12-27 MED ORDER — TRAMADOL HCL 50 MG PO TABS: 50.0000 mg | ORAL_TABLET | Freq: Four times a day (QID) | ORAL | 0 refills | Status: DC | PRN

## 2020-12-27 NOTE — Discharge Instructions (Signed)
Please schedule Tylenol at 500 mg - 650 mg once every 6 hours as needed for aches and pains.  If you still have pain despite taking Tylenol regularly, this is breakthrough pain.  You can use tramadol once every 6 hours for this.  Once your pain is better controlled, switch back to just Tylenol.  

## 2020-12-27 NOTE — ED Provider Notes (Signed)
[place]   MRN: [id number] DOB: Apr 15, 1961  Subjective:   [name] is a 60 y.o. female presenting for suffering a left knee injury today.  She accidentally hit it up against a cart at work and has since had moderate to severe left knee pain.  States that she has significant difficulty bending her knee.  Denies falling from hurting herself.  Has a history of chronic CHF, CKD.  Does not know what her kidney function is lately.  No current facility-administered medications for this encounter.  Current Outpatient Medications:  .  acetaminophen (TYLENOL) 325 MG tablet, Take 2 tablets (650 mg total) by mouth every 4 (four) hours as needed for headache or mild pain., Disp: , Rfl:  .  amLODipine (NORVASC) 5 MG tablet, TAKE 1 TABLET(5 MG) BY MOUTH DAILY. FOLLOW UP OFFICE VISIT TO RECEIVE FURTHER REFILLS, Disp: 30 tablet, Rfl: 1 .  atorvastatin (LIPITOR) 40 MG tablet, Take 1 tablet (40 mg total) by mouth daily. NEED OV LAST ATTEMPT., Disp: 15 tablet, Rfl: 0 .  carvedilol (COREG) 12.5 MG tablet, TAKE 1 TABLET(12.5 MG) BY MOUTH TWICE DAILY WITH A MEAL. FOLLOW UP OFFICE VISIT TO RECEIVE FURTHER REFILLS. THANK YOU, Disp: 60 tablet, Rfl: 1 .  clotrimazole-betamethasone (LOTRISONE) cream, Apply 1 application topically 2 (two) times daily., Disp: 45 g, Rfl: 2 .  ENTRESTO 49-51 MG, TAKE 1 TABLET BY MOUTH TWICE DAILY, Disp: 60 tablet, Rfl: 1 .  HYDROcodone-acetaminophen (NORCO/VICODIN) 5-325 MG tablet, Take 1 tablet by mouth every 6 (six) hours as needed., Disp: , Rfl:  .  meloxicam (MOBIC) 15 MG tablet, Take 1 tablet (15 mg total) by mouth daily., Disp: 20 tablet, Rfl: 0 .  predniSONE (DELTASONE) 20 MG tablet, Take 1 tablet (20 mg total) by mouth 2 (two) times daily with a meal., Disp: 10 tablet, Rfl: 0 .  spironolactone (ALDACTONE) 50 MG tablet, TAKE 1 TABLET BY MOUTH DAILY, Disp: 30 tablet, Rfl: 1 .  terbinafine (LAMISIL) 250 MG tablet, Take 1 tablet (250 mg total) by mouth daily.,  Disp: 30 tablet, Rfl: 0 .  zolpidem (AMBIEN) 5 MG tablet, TK 1 T PO QHS PRF INSOMNIA, Disp: , Rfl: 1   Allergies  Allergen Reactions  . Latex Itching    Past Medical History:  Diagnosis Date  . CHF (congestive heart failure) ([place])   . Hypertension   . Overweight(278.02)   . Sciatica   . Secondary cardiomyopathy, unspecified    EF now 45%     Past Surgical History:  Procedure Laterality Date  . ABDOMINAL HYSTERECTOMY      Family History  Problem Relation Age of Onset  . Cerebral aneurysm Mother 10       ruptured  . Hypertension Mother        severe  . Heart attack Father 22  . Coronary artery disease Other   . Hypertension Brother        severe  . Colon cancer Neg Hx   . Breast cancer Neg Hx     Social History   Tobacco Use  . Smoking status: Never Smoker  . Smokeless tobacco: Never Used  Substance Use Topics  . Alcohol use: Yes    Alcohol/week: 7.0 - 14.0 standard drinks    Types: 7 - 14 Cans of beer per week    Comment: social-daily beers  . Drug use: No    ROS   Objective:   Vitals: BP (!) 157/109 (BP Location: Right Arm)  Pulse (!) 105   Temp 98.8 F (37.1 C) (Oral)   Resp 18   SpO2 99%   Physical Exam Constitutional:      General: She is not in acute distress.    Appearance: Normal appearance. She is well-developed. She is not ill-appearing.  HENT:     Head: Normocephalic and atraumatic.     Nose: Nose normal.     Mouth/Throat:     Mouth: Mucous membranes are moist.     Pharynx: Oropharynx is clear.  Eyes:     General: No scleral icterus.    Extraocular Movements: Extraocular movements intact.     Pupils: Pupils are equal, round, and reactive to light.  Cardiovascular:     Rate and Rhythm: Normal rate.  Pulmonary:     Effort: Pulmonary effort is normal.  Musculoskeletal:     Left knee: Swelling present. No deformity, effusion, erythema, ecchymosis or lacerations. Decreased range of motion. Tenderness (Significant guarding,  exquisite tenderness to the lightest palpation) present. Normal alignment.  Skin:    General: Skin is warm and dry.  Neurological:     General: No focal deficit present.     Mental Status: She is alert and oriented to person, place, and time.  Psychiatric:        Mood and Affect: Mood normal.        Behavior: Behavior normal.     DG Knee 2 Views Left  Result Date: 12/27/2020 CLINICAL DATA:  60 year old female with trauma to the left knee. EXAM: LEFT KNEE - 1-2 VIEW COMPARISON:  Left knee radiograph dated 09/23/2019. FINDINGS: There is no acute fracture or dislocation. The bones are well mineralized. Mild narrowing of the medial compartment. No joint effusion. The soft tissues are unremarkable. IMPRESSION: No acute fracture or dislocation. Electronically Signed   By: Anner Crete M.D.   On: 12/27/2020 19:04     Assessment and Plan :   PDMP not reviewed this encounter.  1. Acute pain of left knee   2. Contusion of left knee, initial encounter     Unable to evaluate fully as patient had significant guarding of her left knee.  X-ray reassuring.  I was able to wrap the left knee using a 6 inch Ace wrap.  Recommended rice method.  Schedule Tylenol and use tramadol for breakthrough pain especially in light of her history of CHF, uncontrolled hypertension, CKD. Counseled patient on potential for adverse effects with medications prescribed/recommended today, ER and return-to-clinic precautions discussed, patient verbalized understanding.    Jaynee Eagles, [place] 12/27/20 1921

## 2020-12-27 NOTE — ED Triage Notes (Signed)
PT reports she hit the left knee on a med. Cart at work and now there is swelling to Lt knee. Skin on Lt knee is hot to touch. Pt reports she can not bend Lt knee due to pain.

## 2021-02-06 ENCOUNTER — Encounter: Payer: Self-pay | Admitting: Cardiology

## 2021-02-06 NOTE — Progress Notes (Signed)
Cardiology Office Note   Date:  02/07/2021   ID:  [name], DOB 14-Jul-1961, MRN [id number]  PCP:  Velna Hatchet, MD  Cardiologist:   Minus Breeding, MD   Chief Complaint  Patient presents with  . Cardiomyopathy      History of Present Illness: [name] is a 60 y.o. female who presents for follow up of hypertensive cardiomyopathy.  Her EF was at one point 30% but two years ago it was up to 50 - 55%.     I last talked to her on virtual visit in 2020.    Since I last saw her she has done okay.  She does not really take all of her medicines all the time and was not taking her Lipitor.  She does not seem to follow-up with diet.  She does stay active.  She walks with 5 laps at a time with her daughter.  She may get a little burning discomfort in her upper chest when she starts but goes away quickly.  She does not have any discomfort as she gets further along in her walk.  She denies any resting shortness of breath, PND or orthopnea.  She had no palpitations, presyncope or syncope.  She has no weight gain or edema.   Past Medical History:  Diagnosis Date  . CHF (congestive heart failure) ([place])   . Hypertension   . Overweight(278.02)   . Sciatica   . Secondary cardiomyopathy, unspecified    EF now 50%    Past Surgical History:  Procedure Laterality Date  . ABDOMINAL HYSTERECTOMY       Current Outpatient Medications  Medication Sig Dispense Refill  . acetaminophen (TYLENOL) 325 MG tablet Take 2 tablets (650 mg total) by mouth every 4 (four) hours as needed for headache or mild pain.    Marland Kitchen amLODipine (NORVASC) 5 MG tablet TAKE 1 TABLET(5 MG) BY MOUTH DAILY. FOLLOW UP OFFICE VISIT TO RECEIVE FURTHER REFILLS 30 tablet 1  . atorvastatin (LIPITOR) 40 MG tablet Take 1 tablet (40 mg total) by mouth daily. NEED OV LAST ATTEMPT. 15 tablet 0  . carvedilol (COREG) 12.5 MG tablet TAKE 1 TABLET(12.5 MG) BY MOUTH TWICE DAILY WITH A MEAL. FOLLOW UP OFFICE VISIT TO RECEIVE FURTHER REFILLS.  THANK YOU 60 tablet 1  . HYDROcodone-acetaminophen (NORCO/VICODIN) 5-325 MG tablet Take 1 tablet by mouth every 6 (six) hours as needed.    . meloxicam (MOBIC) 15 MG tablet Take 1 tablet (15 mg total) by mouth daily. 20 tablet 0  . spironolactone (ALDACTONE) 25 MG tablet Take 25 mg by mouth daily.    Marland Kitchen terbinafine (LAMISIL) 250 MG tablet Take 1 tablet (250 mg total) by mouth daily. 30 tablet 0  . zolpidem (AMBIEN) 5 MG tablet TK 1 T PO QHS PRF INSOMNIA  1  . sacubitril-valsartan (ENTRESTO) 49-51 MG Take 1 tablet by mouth 2 (two) times daily. 60 tablet 11   No current facility-administered medications for this visit.    Allergies:   Latex    Social History:  The patient  reports that she has never smoked. She has never used smokeless tobacco. She reports current alcohol use of about 7.0 - 14.0 standard drinks of alcohol per week. She reports that she does not use drugs.   Family History:  The patient's family history includes Cerebral aneurysm (age of onset: 26) in her mother; Coronary artery disease in an other family member; Heart attack (age of onset: 68) in her father;  Hypertension in her brother and mother.    ROS:  Please see the history of present illness.   Otherwise, review of systems are positive for none.   All other systems are reviewed and negative.    PHYSICAL EXAM: VS:  BP (!) 136/92   Pulse 76   Ht 5\' 6"  (1.676 m)   Wt 215 lb 9.6 oz (97.8 kg)   SpO2 98%   BMI 34.80 kg/m  , BMI Body mass index is 34.8 kg/m. GENERAL:  Well appearing HEENT:  Pupils equal round and reactive, fundi not visualized, oral mucosa unremarkable NECK:  No jugular venous distention, waveform within normal limits, carotid upstroke brisk and symmetric, no bruits, no thyromegaly LYMPHATICS:  No cervical, inguinal adenopathy LUNGS:  Clear to auscultation bilaterally BACK:  No CVA tenderness CHEST:  Unremarkable HEART:  PMI not displaced or sustained,S1 and S2 within normal limits, no S3, no S4, no  clicks, no rubs, no murmurs ABD:  Flat, positive bowel sounds normal in frequency in pitch, no bruits, no rebound, no guarding, no midline pulsatile mass, no hepatomegaly, no splenomegaly EXT:  2 plus pulses throughout, no edema, no cyanosis no clubbing SKIN:  No rashes no nodules NEURO:  Cranial nerves II through XII grossly intact, motor grossly intact throughout PSYCH:  Cognitively intact, oriented to person place and time    EKG:  EKG is ordered today. The ekg ordered today demonstrates sinus rhythm, rate 76, left bundle branch block   Recent Labs: No results found for requested labs within last 8760 hours.    Lipid Panel    Component Value Date/Time   CHOL 211 (H) 08/11/2015 0905   TRIG 68 08/11/2015 0905   HDL 66 08/11/2015 0905   CHOLHDL 3.2 08/11/2015 0905   VLDL 14 08/11/2015 0905   LDLCALC 131 (H) 08/11/2015 0905      Wt Readings from Last 3 Encounters:  02/07/21 215 lb 9.6 oz (97.8 kg)  07/02/20 205 lb (93 kg)  09/23/19 206 lb (93.4 kg)      Other studies Reviewed: Additional studies/ records that were reviewed today include: Labs. Review of the above records demonstrates:  Please see elsewhere in the note.     ASSESSMENT AND PLAN:  NONISCHEMIC CARDIOMYOPATHY: Her ejection fraction had improved.  She seems to be euvolemic.  No change in therapy.  NLZ:JQBHALPF elevated and I asked her to get a blood pressure cuff but she says it is well controlled most of the time.  She understands importance of excellent blood pressure control and we talked about salt and physical activity and weight loss contributing to her blood pressure control.  T  CKD:Her last creatinine was 2.2 last year which is relatively stable.  I would see if I can find her blood work from her primary physician.  See BUN done this year that was normal.  I talked with the patient at length about medication management risk factor control.  DYSLIPIDEMIA:LDL was 195 but she was not taking her  medicines.  She is now back on it.  I think the goal should be less than 70 with her renal insufficiency likely possibly related to vascular disease versus the hypertension.   Current medicines are reviewed at length with the patient today.  The patient does not have concerns regarding medicines.  The following changes have been made:  no change  Labs/ tests ordered today include: None  Orders Placed This Encounter  Procedures  . EKG 12-Lead     Disposition:  FU with me in one year.     Signed, Minus Breeding, MD  02/07/2021 6:13 PM    [facility]

## 2021-02-07 ENCOUNTER — Encounter: Payer: Self-pay | Admitting: Cardiology

## 2021-02-07 ENCOUNTER — Other Ambulatory Visit: Payer: Self-pay

## 2021-02-07 ENCOUNTER — Ambulatory Visit (INDEPENDENT_AMBULATORY_CARE_PROVIDER_SITE_OTHER): Payer: Managed Care, Other (non HMO) | Admitting: Cardiology

## 2021-02-07 VITALS — BP 136/92 | HR 76 | Ht 66.0 in | Wt 215.6 lb

## 2021-02-07 DIAGNOSIS — I428 Other cardiomyopathies: Secondary | ICD-10-CM | POA: Diagnosis not present

## 2021-02-07 DIAGNOSIS — N1831 Chronic kidney disease, stage 3a: Secondary | ICD-10-CM

## 2021-02-07 DIAGNOSIS — I10 Essential (primary) hypertension: Secondary | ICD-10-CM | POA: Diagnosis not present

## 2021-02-07 DIAGNOSIS — E785 Hyperlipidemia, unspecified: Secondary | ICD-10-CM | POA: Diagnosis not present

## 2021-02-07 MED ORDER — ENTRESTO 49-51 MG PO TABS: 1.0000 | ORAL_TABLET | Freq: Two times a day (BID) | ORAL | 11 refills | Status: DC

## 2021-02-07 NOTE — Patient Instructions (Signed)
Medication Instructions:  No changes *If you need a refill on your cardiac medications before your next appointment, please call your pharmacy*   Lab Work: None ordered If you have labs (blood work) drawn today and your tests are completely normal, you will receive your results only by: Marland Kitchen MyChart Message (if you have MyChart) OR . A paper copy in the mail If you have any lab test that is abnormal or we need to change your treatment, we will call you to review the results.   Testing/Procedures: None ordered   Follow-Up: At [facility], you and your health needs are our priority.  As part of our continuing mission to provide you with exceptional heart care, we have created designated Provider Care Teams.  These Care Teams include your primary Cardiologist (physician) and Advanced Practice Providers (APPs -  Physician Assistants and Nurse Practitioners) who all work together to provide you with the care you need, when you need it.  We recommend signing up for the patient portal called "MyChart".  Sign up information is provided on this After Visit Summary.  MyChart is used to connect with patients for Virtual Visits (Telemedicine).  Patients are able to view lab/test results, encounter notes, upcoming appointments, etc.  Non-urgent messages can be sent to your provider as well.   To learn more about what you can do with MyChart, go to [email or web address].    Your next appointment:   12 month(s)  The format for your next appointment:   In Person  Provider:   You may see Minus Breeding, MD or one of the following Advanced Practice Providers on your designated Care Team:    Rosaria Ferries, PA-C  Jory Sims, DNP, ANP

## 2021-02-26 ENCOUNTER — Other Ambulatory Visit: Payer: Self-pay | Admitting: Cardiology

## 2021-03-08 ENCOUNTER — Ambulatory Visit (HOSPITAL_COMMUNITY)
Admission: EM | Admit: 2021-03-08 | Discharge: 2021-03-08 | Disposition: A | Discharge status: Home / Self Care | Payer: Managed Care, Other (non HMO) | Attending: Emergency Medicine | Admitting: Emergency Medicine

## 2021-03-08 ENCOUNTER — Encounter (HOSPITAL_COMMUNITY): Payer: Self-pay

## 2021-03-08 ENCOUNTER — Other Ambulatory Visit: Payer: Self-pay

## 2021-03-08 DIAGNOSIS — S39012A Strain of muscle, fascia and tendon of lower back, initial encounter: Secondary | ICD-10-CM | POA: Diagnosis not present

## 2021-03-08 DIAGNOSIS — M79671 Pain in right foot: Secondary | ICD-10-CM

## 2021-03-08 DIAGNOSIS — M79672 Pain in left foot: Secondary | ICD-10-CM

## 2021-03-08 MED ORDER — MELOXICAM 7.5 MG PO TABS: 7.5000 mg | ORAL_TABLET | Freq: Every day | ORAL | 0 refills | Status: DC

## 2021-03-08 NOTE — ED Provider Notes (Signed)
[place]    CSN: [id number] Arrival date & time: 03/08/21  1135      History   Chief Complaint Chief Complaint  Patient presents with  . Foot Pain  . Back Pain    HPI [name] is a 60 y.o. female.   Patient here for evaluation of bilateral foot pain and lower back pain.  Reports being evaluated for it in the past several times.  Reports symptoms are the same as previous episodes.  Reports diagnosed with plantar fasciitis and was told to follow up with podiatry but was not having pain by the time she was evaluated there.  Reports taking prednisone and NSAIDs for relief in the past.  Denies any back pain with movement, tingling or numbness in lower extremities.  Denies any loss of bowel or bladder control.  Denies any trauma, injury, or other precipitating event.  Denies any specific alleviating or aggravating factors.  Denies any fevers, chest pain, shortness of breath, N/V/D, numbness, tingling, weakness, abdominal pain, or headaches.   ROS: As per HPI, all other pertinent ROS negative    The history is provided by the patient.  Foot Pain  Back Pain   Past Medical History:  Diagnosis Date  . CHF (congestive heart failure) ([place])   . Hypertension   . Overweight(278.02)   . Sciatica   . Secondary cardiomyopathy, unspecified    EF now 50%    Patient Active Problem List   Diagnosis Date Noted  . Hyperlipidemia 02/05/2019  . Advice given about COVID-19 virus infection 02/05/2019  . CKD (chronic kidney disease), stage III ([place]) 02/03/2018  . Insomnia 10/14/2014  . At risk for sudden cardiac death 08/30/2014  . Chronic systolic HF (heart failure) ([place]) 08/30/14  . Essential hypertension, malignant 08/08/2014  . Non compliance with medical treatment 08/07/2014  . Normal coronary arteries 2006 08/07/2014  . Renal insufficiency- unclear if chronic or not 08/07/2014  . Acute on chronic systolic CHF (congestive heart failure), NYHA class 4 ([place]) 08/06/2014  .  DYSPNEA 07/04/2010  . Chest pain- MI r/o 05/24/2009  . Obesity (BMI 30-39.9) 02/13/2009  . Uncontrolled hypertension 02/13/2009  . NICM- EF 25-30% echo 08/05/14 02/13/2009    Past Surgical History:  Procedure Laterality Date  . ABDOMINAL HYSTERECTOMY      OB History   No obstetric history on file.      Home Medications    Prior to Admission medications   Medication Sig Start Date End Date Taking? Authorizing Provider  meloxicam (MOBIC) 7.5 MG tablet Take 1 tablet (7.5 mg total) by mouth daily. 03/08/21  Yes Pearson Forster, NP  acetaminophen (TYLENOL) 325 MG tablet Take 2 tablets (650 mg total) by mouth every 4 (four) hours as needed for headache or mild pain. 08/09/14   Erlene Quan, PA-C  amLODipine (NORVASC) 5 MG tablet TAKE 1 TABLET(5 MG) BY MOUTH DAILY. FOLLOW UP OFFICE VISIT TO RECEIVE FURTHER REFILLS 02/26/21   Minus Breeding, MD  atorvastatin (LIPITOR) 40 MG tablet Take 1 tablet (40 mg total) by mouth daily. NEED OV LAST ATTEMPT. 10/12/20   Minus Breeding, MD  carvedilol (COREG) 12.5 MG tablet TAKE 1 TABLET(12.5 MG) BY MOUTH TWICE DAILY WITH A MEAL. FOLLOW UP OFFICE VISIT TO RECEIVE FURTHER REFILLS. [place] YOU 02/26/21   Minus Breeding, MD  HYDROcodone-acetaminophen (NORCO/VICODIN) 5-325 MG tablet Take 1 tablet by mouth every 6 (six) hours as needed. 05/31/20   [provider]  sacubitril-valsartan (ENTRESTO) 49-51 MG Take 1  tablet by mouth 2 (two) times daily. 02/07/21   Minus Breeding, MD  spironolactone (ALDACTONE) 25 MG tablet Take 25 mg by mouth daily.    [provider]  terbinafine (LAMISIL) 250 MG tablet Take 1 tablet (250 mg total) by mouth daily. 07/17/20   Edrick Kins, DPM  zolpidem (AMBIEN) 5 MG tablet TK 1 T PO QHS PRF INSOMNIA 01/17/18   [provider]    Family History Family History  Problem Relation Age of Onset  . Cerebral aneurysm Mother 53       ruptured  . Hypertension Mother        severe  . Heart attack Father 51  .  Coronary artery disease Other   . Hypertension Brother        severe  . Colon cancer Neg Hx   . Breast cancer Neg Hx     Social History Social History   Tobacco Use  . Smoking status: Never Smoker  . Smokeless tobacco: Never Used  Substance Use Topics  . Alcohol use: Yes    Alcohol/week: 7.0 - 14.0 standard drinks    Types: 7 - 14 Cans of beer per week    Comment: social-daily beers  . Drug use: No     Allergies   Latex   Review of Systems Review of Systems  Musculoskeletal: Positive for back pain.     Physical Exam Triage Vital Signs ED Triage Vitals  Enc Vitals Group     BP 03/08/21 1236 (!) 131/96     Pulse Rate 03/08/21 1235 85     Resp 03/08/21 1235 20     Temp 03/08/21 1235 98.1 F (36.7 C)     Temp Source 03/08/21 1235 Oral     SpO2 03/08/21 1235 96 %     Weight --      Height --      Head Circumference --      Peak Flow --      Pain Score 03/08/21 1233 8     Pain Loc --      Pain Edu? --      Excl. in [place]? --    No data found.  Updated Vital Signs BP (!) 131/96 (BP Location: Right Arm)   Pulse 85   Temp 98.1 F (36.7 C) (Oral)   Resp 20   SpO2 96%   Visual Acuity Right Eye Distance:   Left Eye Distance:   Bilateral Distance:    Right Eye Near:   Left Eye Near:    Bilateral Near:     Physical Exam Vitals and nursing note reviewed.  Constitutional:      General: She is not in acute distress.    Appearance: Normal appearance. She is not ill-appearing, toxic-appearing or diaphoretic.  HENT:     Head: Normocephalic and atraumatic.  Eyes:     Conjunctiva/sclera: Conjunctivae normal.  Cardiovascular:     Rate and Rhythm: Normal rate.     Pulses: Normal pulses.  Pulmonary:     Effort: Pulmonary effort is normal.  Abdominal:     General: Abdomen is flat.  Musculoskeletal:        General: Normal range of motion.     Cervical back: Normal and normal range of motion.     Thoracic back: Normal.     Lumbar back: Normal. No tenderness  or bony tenderness. Normal range of motion. Negative right straight leg raise test and negative left straight leg raise test.  Right foot: Normal.     Left foot: Normal.  Skin:    General: Skin is warm and dry.  Neurological:     General: No focal deficit present.     Mental Status: She is alert and oriented to person, place, and time.  Psychiatric:        Mood and Affect: Mood normal.      UC Treatments / Results  Labs (all labs ordered are listed, but only abnormal results are displayed) Labs Reviewed - No data to display  EKG   Radiology No results found.  Procedures Procedures (including critical care time)  Medications Ordered in UC Medications - No data to display  Initial Impression / Assessment and Plan / UC Course  I have reviewed the triage vital signs and the nursing notes.  Pertinent labs & imaging results that were available during my care of the patient were reviewed by me and considered in my medical decision making (see chart for details).    Assessment negative for red flags or concerns.  Will prescribe Mobic daily for pain.  Encourage fluids and rest.  Recommend following up with primary care for long-term management and possible referral to podiatry.  Final Clinical Impressions(s) / UC Diagnoses   Final diagnoses:  Foot pain, left  Foot pain, right  Strain of lumbar region, initial encounter     Discharge Instructions     Take the Mobic (1-2 pills) daily for pain.    Rest and drink plenty of fluids.    Follow up with your primary care for long term management of foot pain.      ED Prescriptions    Medication Sig Dispense Auth. Provider   meloxicam (MOBIC) 7.5 MG tablet Take 1 tablet (7.5 mg total) by mouth daily. 15 tablet Pearson Forster, NP     PDMP not reviewed this encounter.   Pearson Forster, NP 03/08/21 [phone]

## 2021-03-08 NOTE — ED Triage Notes (Signed)
Pt c/o right foot pain that moves to the left foot and states the pain has radiated to the lower back X 2 days.

## 2021-03-08 NOTE — Discharge Instructions (Addendum)
Take the Mobic (1-2 pills) daily for pain.    Rest and drink plenty of fluids.    Follow up with your primary care for long term management of foot pain.

## 2021-03-30 ENCOUNTER — Ambulatory Visit: Payer: Managed Care, Other (non HMO) | Admitting: Podiatry

## 2021-04-11 ENCOUNTER — Other Ambulatory Visit: Payer: Self-pay

## 2021-04-11 ENCOUNTER — Ambulatory Visit: Payer: Managed Care, Other (non HMO) | Admitting: Podiatry

## 2021-04-11 ENCOUNTER — Ambulatory Visit (INDEPENDENT_AMBULATORY_CARE_PROVIDER_SITE_OTHER): Payer: Managed Care, Other (non HMO)

## 2021-04-11 DIAGNOSIS — Q666 Other congenital valgus deformities of feet: Secondary | ICD-10-CM

## 2021-04-17 ENCOUNTER — Encounter: Payer: Self-pay | Admitting: Podiatry

## 2021-04-17 NOTE — Progress Notes (Signed)
Subjective:  Patient ID: [name], female    DOB: [birth year],  MRN: [id number]  Chief Complaint  Patient presents with   Foot Pain    Bilateral foot pain on the side of foot towards the heel     60 y.o. female presents with the above complaint.  Patient presents with complaint of flatfoot with heel pain as well as arch pain.  She states been going on for quite some time.  She states been over 3 months sometimes dull ache it hurts at the end of the day.  She denies any other acute complaint she does not wear any orthotics she wears okay shoes.  She is constantly on her foot.  She would like to discuss treatment options for it.   Review of Systems: Negative except as noted in the HPI. Denies N/V/F/Ch.  Past Medical History:  Diagnosis Date   CHF (congestive heart failure) (HCC)    Hypertension    Overweight(278.02)    Sciatica    Secondary cardiomyopathy, unspecified    EF now 50%    Current Outpatient Medications:    acetaminophen (TYLENOL) 325 MG tablet, Take 2 tablets (650 mg total) by mouth every 4 (four) hours as needed for headache or mild pain., Disp: , Rfl:    amLODipine (NORVASC) 5 MG tablet, TAKE 1 TABLET(5 MG) BY MOUTH DAILY. FOLLOW UP OFFICE VISIT TO RECEIVE FURTHER REFILLS, Disp: 30 tablet, Rfl: 1   atorvastatin (LIPITOR) 40 MG tablet, Take 1 tablet (40 mg total) by mouth daily. NEED OV LAST ATTEMPT., Disp: 15 tablet, Rfl: 0   carvedilol (COREG) 12.5 MG tablet, TAKE 1 TABLET(12.5 MG) BY MOUTH TWICE DAILY WITH A MEAL. FOLLOW UP OFFICE VISIT TO RECEIVE FURTHER REFILLS. THANK YOU, Disp: 60 tablet, Rfl: 11   HYDROcodone-acetaminophen (NORCO/VICODIN) 5-325 MG tablet, Take 1 tablet by mouth every 6 (six) hours as needed., Disp: , Rfl:    meloxicam (MOBIC) 7.5 MG tablet, Take 1 tablet (7.5 mg total) by mouth daily., Disp: 15 tablet, Rfl: 0   sacubitril-valsartan (ENTRESTO) 49-51 MG, Take 1 tablet by mouth 2 (two) times daily., Disp: 60 tablet, Rfl: 11   spironolactone  (ALDACTONE) 25 MG tablet, Take 25 mg by mouth daily., Disp: , Rfl:    terbinafine (LAMISIL) 250 MG tablet, Take 1 tablet (250 mg total) by mouth daily., Disp: 30 tablet, Rfl: 0   zolpidem (AMBIEN) 5 MG tablet, TK 1 T PO QHS PRF INSOMNIA, Disp: , Rfl: 1  Social History   Tobacco Use  Smoking Status Never  Smokeless Tobacco Never    Allergies  Allergen Reactions   Latex Itching   Objective:  There were no vitals filed for this visit. There is no height or weight on file to calculate BMI. Constitutional Well developed. Well nourished.  Vascular Dorsalis pedis pulses palpable bilaterally. Posterior tibial pulses palpable bilaterally. Capillary refill normal to all digits.  No cyanosis or clubbing noted. Pedal hair growth normal.  Neurologic Normal speech. Oriented to person, place, and time. Epicritic sensation to light touch grossly present bilaterally.  Dermatologic Nails well groomed and normal in appearance. No open wounds. No skin lesions.  Orthopedic: Gait examination shows pes planovalgus foot structure with calcaneovalgus to many toe signs unable to recruit the arch with dorsiflexion of the hallux.  No plantar fasciitis.  Mild subjective arch pain noted bilaterally.   Radiographs: 3 views of skeletally mature adult bilateral foot: There is decreasing calcaneal inclination angle increasing talar declination angle anterior break in the  cyma line mild midfoot arthritis noted.  Posterior heel spurring noted.  No other bony abnormalities identified no elevatus present Assessment:   1. Pes planovalgus    Plan:  Patient was evaluated and treated and all questions answered.  Bilateral pes planovalgus -I explained the patient the etiology of pes planovalgus and various treatment options were extensively discussed.  Given the nature of the flatfoot that she has with terms of arch and heel pain that is starting to become an issue I believe she will benefit from custom-made  orthotics.  Also discussed shoe gear modification as well patient states understanding -She was casted for orthotics.  No follow-ups on file.

## 2021-05-03 ENCOUNTER — Other Ambulatory Visit: Payer: Self-pay | Admitting: Cardiology

## 2021-05-03 NOTE — Telephone Encounter (Signed)
Rx(s) sent to pharmacy electronically.  

## 2021-05-09 ENCOUNTER — Ambulatory Visit (INDEPENDENT_AMBULATORY_CARE_PROVIDER_SITE_OTHER): Payer: Managed Care, Other (non HMO) | Admitting: Podiatry

## 2021-05-09 ENCOUNTER — Other Ambulatory Visit: Payer: Self-pay

## 2021-05-09 DIAGNOSIS — M779 Enthesopathy, unspecified: Secondary | ICD-10-CM

## 2021-05-09 DIAGNOSIS — Q666 Other congenital valgus deformities of feet: Secondary | ICD-10-CM

## 2021-05-09 DIAGNOSIS — M7662 Achilles tendinitis, left leg: Secondary | ICD-10-CM

## 2021-05-09 NOTE — Patient Instructions (Signed)

## 2021-05-09 NOTE — Progress Notes (Signed)
Patient presents today for orthotic pick up. Patient voices no new complaints.  Orthotics were fitted to patient's feet. No discomfort and no rubbing. Patient satisfied with the orthotics.  Orthotics were dispensed to patient with instructions for break in wear and to call the office with any concerns or questions. 

## 2021-07-11 ENCOUNTER — Other Ambulatory Visit: Payer: Self-pay

## 2021-07-11 ENCOUNTER — Ambulatory Visit (INDEPENDENT_AMBULATORY_CARE_PROVIDER_SITE_OTHER): Payer: Managed Care, Other (non HMO) | Admitting: Podiatry

## 2021-07-11 DIAGNOSIS — M7661 Achilles tendinitis, right leg: Secondary | ICD-10-CM

## 2021-07-11 MED ORDER — METHYLPREDNISOLONE 4 MG PO TBPK: ORAL_TABLET | ORAL | 0 refills | Status: DC

## 2021-07-17 ENCOUNTER — Other Ambulatory Visit: Payer: Self-pay | Admitting: Internal Medicine

## 2021-07-17 DIAGNOSIS — Z1231 Encounter for screening mammogram for malignant neoplasm of breast: Secondary | ICD-10-CM

## 2021-07-18 ENCOUNTER — Encounter: Payer: Self-pay | Admitting: Podiatry

## 2021-07-18 NOTE — Progress Notes (Signed)
Subjective:  Patient ID: [name], female    DOB: 11-02-1961,  MRN: [id number]  Chief Complaint  Patient presents with   Foot Pain    Bilateral foot pain  PT stated that she is still having some pain in both feet     60 y.o. female presents with the above complaint.  Patient presents with complaint of right Achilles tendinitis.  She states both of the back part of her heel hurts however the right is much worse than the left side.  She states it has progressed to gotten worse she is painful to touch.  She had orthotics done which seems to help considerably however she works a lot on her foot and has progressed to gotten sore and painful.  She wanted to get it evaluated she denies any other acute complaint she has not seen anyone else prior to seeing me for this.  Issue.   Review of Systems: Negative except as noted in the HPI. Denies N/V/F/Ch.  Past Medical History:  Diagnosis Date   CHF (congestive heart failure) (HCC)    Hypertension    Overweight(278.02)    Sciatica    Secondary cardiomyopathy, unspecified    EF now 50%    Current Outpatient Medications:    methylPREDNISolone (MEDROL DOSEPAK) 4 MG TBPK tablet, Take as directed, Disp: 21 each, Rfl: 0   acetaminophen (TYLENOL) 325 MG tablet, Take 2 tablets (650 mg total) by mouth every 4 (four) hours as needed for headache or mild pain., Disp: , Rfl:    amLODipine (NORVASC) 5 MG tablet, Take 1 tablet (5 mg total) by mouth daily., Disp: 90 tablet, Rfl: 2   atorvastatin (LIPITOR) 40 MG tablet, Take 1 tablet (40 mg total) by mouth daily. NEED OV LAST ATTEMPT., Disp: 15 tablet, Rfl: 0   carvedilol (COREG) 12.5 MG tablet, TAKE 1 TABLET(12.5 MG) BY MOUTH TWICE DAILY WITH A MEAL. FOLLOW UP OFFICE VISIT TO RECEIVE FURTHER REFILLS. THANK YOU, Disp: 60 tablet, Rfl: 11   HYDROcodone-acetaminophen (NORCO/VICODIN) 5-325 MG tablet, Take 1 tablet by mouth every 6 (six) hours as needed., Disp: , Rfl:    meloxicam (MOBIC) 7.5 MG tablet, Take 1  tablet (7.5 mg total) by mouth daily., Disp: 15 tablet, Rfl: 0   sacubitril-valsartan (ENTRESTO) 49-51 MG, Take 1 tablet by mouth 2 (two) times daily., Disp: 60 tablet, Rfl: 11   spironolactone (ALDACTONE) 25 MG tablet, Take 25 mg by mouth daily., Disp: , Rfl:    terbinafine (LAMISIL) 250 MG tablet, Take 1 tablet (250 mg total) by mouth daily., Disp: 30 tablet, Rfl: 0   zolpidem (AMBIEN) 5 MG tablet, TK 1 T PO QHS PRF INSOMNIA, Disp: , Rfl: 1  Social History   Tobacco Use  Smoking Status Never  Smokeless Tobacco Never    Allergies  Allergen Reactions   Latex Itching   Objective:  There were no vitals filed for this visit. There is no height or weight on file to calculate BMI. Constitutional Well developed. Well nourished.  Vascular Dorsalis pedis pulses palpable bilaterally. Posterior tibial pulses palpable bilaterally. Capillary refill normal to all digits.  No cyanosis or clubbing noted. Pedal hair growth normal.  Neurologic Normal speech. Oriented to person, place, and time. Epicritic sensation to light touch grossly present bilaterally.  Dermatologic Nails well groomed and normal in appearance. No open wounds. No skin lesions.  Orthopedic: Pain on palpation to the posterior Achilles tendon insertion.  Pain with range of motion of the ankle joint especially with dorsiflexion.  No pain with plantarflexion of the ankle joint active and passive.  No pain at the peroneal tendon and posterior tibial tendon at ATFL ligament.  Positive Silfverskiold test with gastrocnemius equinus   Radiographs: We will redo the x-ray during next clinical visit if her pain has not resolved Assessment:   1. Achilles tendinitis, right leg    Plan:  Patient was evaluated and treated and all questions answered.  Right Achilles tendinitis with insertional pain with gastrocnemius equinus -Explained to the patient the etiology of Achilles tendinitis and various treatment options were extensively  discussed.  Given the amount of pain that she is having I believe she will benefit from cam boot immobilization to help decrease acute inflammatory component associate with pain.  Patient agrees with the plan would like to be immobilized in the cam boot.  She will also benefit from Medrol Dosepak to help with the inflammation component of it.  She states understanding -Cam boot and Medrol Dosepak were dispensed -If there is no improvement in pain we will plan on redoing the x-ray of the right foot during next clinical visit.    No follow-ups on file.

## 2021-08-08 ENCOUNTER — Ambulatory Visit
Admission: RE | Admit: 2021-08-08 | Discharge: 2021-08-08 | Disposition: A | Discharge status: Home / Self Care | Payer: Managed Care, Other (non HMO) | Source: Ambulatory Visit | Attending: Internal Medicine | Admitting: Internal Medicine

## 2021-08-08 ENCOUNTER — Other Ambulatory Visit: Payer: Self-pay

## 2021-08-08 DIAGNOSIS — Z1231 Encounter for screening mammogram for malignant neoplasm of breast: Secondary | ICD-10-CM

## 2021-08-15 ENCOUNTER — Ambulatory Visit: Payer: Managed Care, Other (non HMO) | Admitting: Podiatry

## 2021-08-31 ENCOUNTER — Ambulatory Visit: Payer: Managed Care, Other (non HMO) | Admitting: Podiatry

## 2021-12-13 ENCOUNTER — Other Ambulatory Visit: Payer: Self-pay

## 2021-12-13 ENCOUNTER — Emergency Department (HOSPITAL_COMMUNITY): Payer: Managed Care, Other (non HMO)

## 2021-12-13 ENCOUNTER — Inpatient Hospital Stay (HOSPITAL_COMMUNITY)
Admission: EM | Admit: 2021-12-13 | Discharge: 2021-12-18 | DRG: 061 | Disposition: A | Discharge status: Home / Self Care | Payer: Managed Care, Other (non HMO) | Attending: Neurology | Admitting: Neurology

## 2021-12-13 ENCOUNTER — Encounter (HOSPITAL_COMMUNITY): Payer: Self-pay

## 2021-12-13 DIAGNOSIS — M79672 Pain in left foot: Secondary | ICD-10-CM | POA: Diagnosis not present

## 2021-12-13 DIAGNOSIS — M79601 Pain in right arm: Secondary | ICD-10-CM | POA: Diagnosis not present

## 2021-12-13 DIAGNOSIS — I472 Ventricular tachycardia, unspecified: Secondary | ICD-10-CM | POA: Diagnosis not present

## 2021-12-13 DIAGNOSIS — Z8249 Family history of ischemic heart disease and other diseases of the circulatory system: Secondary | ICD-10-CM

## 2021-12-13 DIAGNOSIS — Z90710 Acquired absence of both cervix and uterus: Secondary | ICD-10-CM | POA: Diagnosis not present

## 2021-12-13 DIAGNOSIS — I63412 Cerebral infarction due to embolism of left middle cerebral artery: Principal | ICD-10-CM | POA: Diagnosis present

## 2021-12-13 DIAGNOSIS — Z791 Long term (current) use of non-steroidal anti-inflammatories (NSAID): Secondary | ICD-10-CM | POA: Diagnosis not present

## 2021-12-13 DIAGNOSIS — I428 Other cardiomyopathies: Secondary | ICD-10-CM | POA: Diagnosis present

## 2021-12-13 DIAGNOSIS — R29704 NIHSS score 4: Secondary | ICD-10-CM | POA: Diagnosis present

## 2021-12-13 DIAGNOSIS — I5043 Acute on chronic combined systolic (congestive) and diastolic (congestive) heart failure: Secondary | ICD-10-CM | POA: Diagnosis present

## 2021-12-13 DIAGNOSIS — I160 Hypertensive urgency: Secondary | ICD-10-CM | POA: Diagnosis present

## 2021-12-13 DIAGNOSIS — I10 Essential (primary) hypertension: Secondary | ICD-10-CM | POA: Diagnosis not present

## 2021-12-13 DIAGNOSIS — I43 Cardiomyopathy in diseases classified elsewhere: Secondary | ICD-10-CM | POA: Diagnosis present

## 2021-12-13 DIAGNOSIS — E785 Hyperlipidemia, unspecified: Secondary | ICD-10-CM | POA: Diagnosis present

## 2021-12-13 DIAGNOSIS — R4701 Aphasia: Secondary | ICD-10-CM | POA: Diagnosis present

## 2021-12-13 DIAGNOSIS — M79605 Pain in left leg: Secondary | ICD-10-CM | POA: Diagnosis not present

## 2021-12-13 DIAGNOSIS — U071 COVID-19: Secondary | ICD-10-CM | POA: Diagnosis present

## 2021-12-13 DIAGNOSIS — I639 Cerebral infarction, unspecified: Secondary | ICD-10-CM

## 2021-12-13 DIAGNOSIS — Z79899 Other long term (current) drug therapy: Secondary | ICD-10-CM

## 2021-12-13 DIAGNOSIS — R1310 Dysphagia, unspecified: Secondary | ICD-10-CM | POA: Diagnosis present

## 2021-12-13 DIAGNOSIS — N1832 Chronic kidney disease, stage 3b: Secondary | ICD-10-CM | POA: Diagnosis present

## 2021-12-13 DIAGNOSIS — R29810 Facial weakness: Secondary | ICD-10-CM | POA: Diagnosis present

## 2021-12-13 DIAGNOSIS — I5023 Acute on chronic systolic (congestive) heart failure: Secondary | ICD-10-CM | POA: Diagnosis not present

## 2021-12-13 DIAGNOSIS — E669 Obesity, unspecified: Secondary | ICD-10-CM | POA: Diagnosis present

## 2021-12-13 DIAGNOSIS — N179 Acute kidney failure, unspecified: Secondary | ICD-10-CM | POA: Diagnosis present

## 2021-12-13 DIAGNOSIS — Z91040 Latex allergy status: Secondary | ICD-10-CM

## 2021-12-13 DIAGNOSIS — I6389 Other cerebral infarction: Secondary | ICD-10-CM | POA: Diagnosis not present

## 2021-12-13 DIAGNOSIS — M79662 Pain in left lower leg: Secondary | ICD-10-CM | POA: Diagnosis not present

## 2021-12-13 DIAGNOSIS — I130 Hypertensive heart and chronic kidney disease with heart failure and stage 1 through stage 4 chronic kidney disease, or unspecified chronic kidney disease: Secondary | ICD-10-CM | POA: Diagnosis present

## 2021-12-13 DIAGNOSIS — Z6833 Body mass index (BMI) 33.0-33.9, adult: Secondary | ICD-10-CM | POA: Diagnosis not present

## 2021-12-13 DIAGNOSIS — Z888 Allergy status to other drugs, medicaments and biological substances status: Secondary | ICD-10-CM

## 2021-12-13 DIAGNOSIS — E7800 Pure hypercholesterolemia, unspecified: Secondary | ICD-10-CM | POA: Diagnosis not present

## 2021-12-13 LAB — RESP PANEL BY RT-PCR (FLU A&B, COVID) ARPGX2
Influenza A by PCR: NEGATIVE
Influenza B by PCR: NEGATIVE
SARS Coronavirus 2 by RT PCR: POSITIVE — AB

## 2021-12-13 LAB — POCT I-STAT, CHEM 8
BUN: 42 mg/dL — ABNORMAL HIGH (ref 6–20)
Calcium, Ion: 1.18 mmol/L (ref 1.15–1.40)
Chloride: 115 mmol/L — ABNORMAL HIGH (ref 98–111)
Creatinine, Ser: 2.3 mg/dL — ABNORMAL HIGH (ref 0.44–1.00)
Glucose, Bld: 111 mg/dL — ABNORMAL HIGH (ref 70–99)
HCT: 37 % (ref 36.0–46.0)
Hemoglobin: 12.6 g/dL (ref 12.0–15.0)
Potassium: 4.7 mmol/L (ref 3.5–5.1)
Sodium: 142 mmol/L (ref 135–145)
TCO2: 20 mmol/L — ABNORMAL LOW (ref 22–32)

## 2021-12-13 LAB — DIFFERENTIAL
Abs Immature Granulocytes: 0.02 10*3/uL (ref 0.00–0.07)
Basophils Absolute: 0 10*3/uL (ref 0.0–0.1)
Basophils Relative: 0 %
Eosinophils Absolute: 0.2 10*3/uL (ref 0.0–0.5)
Eosinophils Relative: 2 %
Immature Granulocytes: 0 %
Lymphocytes Relative: 29 %
Lymphs Abs: 1.9 10*3/uL (ref 0.7–4.0)
Monocytes Absolute: 0.9 10*3/uL (ref 0.1–1.0)
Monocytes Relative: 14 %
Neutro Abs: 3.5 10*3/uL (ref 1.7–7.7)
Neutrophils Relative %: 55 %

## 2021-12-13 LAB — ETHANOL: Alcohol, Ethyl (B): <10 mg/dL (ref <10)

## 2021-12-13 LAB — CBC
HCT: 35.5 % — ABNORMAL LOW (ref 36.0–46.0)
Hemoglobin: 11.8 g/dL — ABNORMAL LOW (ref 12.0–15.0)
MCH: 30.6 pg (ref 26.0–34.0)
MCHC: 33.2 g/dL (ref 30.0–36.0)
MCV: 92.2 fL (ref 80.0–100.0)
Platelets: 259 10*3/uL (ref 150–400)
RBC: 3.85 MIL/uL — ABNORMAL LOW (ref 3.87–5.11)
RDW: 14.5 % (ref 11.5–15.5)
WBC: 6.5 10*3/uL (ref 4.0–10.5)
nRBC: 0 % (ref 0.0–0.2)

## 2021-12-13 LAB — HIV ANTIBODY (ROUTINE TESTING W REFLEX): HIV Screen 4th Generation wRfx: NONREACTIVE

## 2021-12-13 LAB — COMPREHENSIVE METABOLIC PANEL
ALT: 13 U/L (ref 0–44)
AST: 17 U/L (ref 15–41)
Albumin: 4 g/dL (ref 3.5–5.0)
Alkaline Phosphatase: 113 U/L (ref 38–126)
Anion gap: 11 (ref 5–15)
BUN: 41 mg/dL — ABNORMAL HIGH (ref 6–20)
CO2: 18 mmol/L — ABNORMAL LOW (ref 22–32)
Calcium: 9.2 mg/dL (ref 8.9–10.3)
Chloride: 113 mmol/L — ABNORMAL HIGH (ref 98–111)
Creatinine, Ser: 2.21 mg/dL — ABNORMAL HIGH (ref 0.44–1.00)
GFR, Estimated: 25 mL/min — ABNORMAL LOW (ref >60)
Glucose, Bld: 113 mg/dL — ABNORMAL HIGH (ref 70–99)
Potassium: 4.6 mmol/L (ref 3.5–5.1)
Sodium: 142 mmol/L (ref 135–145)
Total Bilirubin: 0.6 mg/dL (ref 0.3–1.2)
Total Protein: 7.8 g/dL (ref 6.5–8.1)

## 2021-12-13 LAB — APTT: aPTT: 24 seconds (ref 24–36)

## 2021-12-13 LAB — CBG MONITORING, ED: Glucose-Capillary: 99 mg/dL (ref 70–99)

## 2021-12-13 LAB — I-STAT BETA HCG BLOOD, ED (NOT ORDERABLE): I-stat hCG, quantitative: <5 m[IU]/mL (ref <5)

## 2021-12-13 LAB — PROTIME-INR
INR: 1 (ref 0.8–1.2)
Prothrombin Time: 13.2 seconds (ref 11.4–15.2)

## 2021-12-13 LAB — MRSA NEXT GEN BY PCR, NASAL: MRSA by PCR Next Gen: NOT DETECTED

## 2021-12-13 MED ORDER — ACETAMINOPHEN 650 MG RE SUPP: 650.0000 mg | RECTAL | Status: DC | PRN

## 2021-12-13 MED ORDER — ACETAMINOPHEN 325 MG PO TABS
650.0000 mg | ORAL_TABLET | ORAL | Status: DC | PRN
  Administered 2021-12-14 – 2021-12-17 (×4): 650 mg via ORAL
  Filled 2021-12-13 (×4): qty 2

## 2021-12-13 MED ORDER — SODIUM CHLORIDE 0.9 % IV SOLN: INTRAVENOUS | Status: DC

## 2021-12-13 MED ORDER — SENNOSIDES-DOCUSATE SODIUM 8.6-50 MG PO TABS: 1.0000 | ORAL_TABLET | Freq: Every evening | ORAL | Status: DC | PRN

## 2021-12-13 MED ORDER — CHLORHEXIDINE GLUCONATE CLOTH 2 % EX PADS
6.0000 | MEDICATED_PAD | Freq: Every day | CUTANEOUS | Status: DC
  Administered 2021-12-13 – 2021-12-18 (×5): 6 via TOPICAL

## 2021-12-13 MED ORDER — SODIUM CHLORIDE 0.9% FLUSH
3.0000 mL | Freq: Once | INTRAVENOUS | Status: AC
  Administered 2021-12-13: 3 mL via INTRAVENOUS

## 2021-12-13 MED ORDER — TENECTEPLASE FOR STROKE
0.2500 mg/kg | PACK | Freq: Once | INTRAVENOUS | Status: AC
  Administered 2021-12-13: 24 mg via INTRAVENOUS

## 2021-12-13 MED ORDER — LABETALOL HCL 5 MG/ML IV SOLN
10.0000 mg | Freq: Once | INTRAVENOUS | Status: AC
  Administered 2021-12-13: 10 mg via INTRAVENOUS
  Filled 2021-12-13: qty 4

## 2021-12-13 MED ORDER — PANTOPRAZOLE SODIUM 40 MG IV SOLR
40.0000 mg | Freq: Every day | INTRAVENOUS | Status: DC
  Administered 2021-12-13 – 2021-12-14 (×2): 40 mg via INTRAVENOUS
  Filled 2021-12-13 (×2): qty 10

## 2021-12-13 MED ORDER — MUPIROCIN 2 % EX OINT: 1.0000 "application " | TOPICAL_OINTMENT | Freq: Two times a day (BID) | CUTANEOUS | Status: DC

## 2021-12-13 MED ORDER — STROKE: EARLY STAGES OF RECOVERY BOOK
Freq: Once | Status: AC
  Filled 2021-12-13 (×2): qty 1

## 2021-12-13 MED ORDER — ACETAMINOPHEN 160 MG/5ML PO SOLN
650.0000 mg | ORAL | Status: DC | PRN
  Filled 2021-12-13: qty 20.3

## 2021-12-13 MED ORDER — CLEVIDIPINE BUTYRATE 0.5 MG/ML IV EMUL: 0.0000 mg/h | INTRAVENOUS | Status: DC

## 2021-12-13 NOTE — H&P (Signed)
Neurology H&P  Reason for Consult: Code Stroke Referring Physician: Dr. Sherry Ruffing  CC: Speech disturbance  History is obtained from: EMS, unable to obtain from patient due to patient's speech disturbance  HPI: [name] is a 61 y.o. female with a medical history significant for essential hypertension, CHF, obesity with a BMI of 34.73 kg/m, and secondary cardiomyopathy who presented to the ED via EMS on 2/9 for evaluation of acute onset of right facial droop and speech disturbance. Patient was in her usual state of health today until 14:30 when she was sitting with her co-workers and talking. Her co-workers noted that while speaking, [name] right face began to droop and her speech changed with difficulty word-finding and slurred speech and EMS was activated for further evaluation. Her blood pressure was in the 993'Z systolic en route with a CBG of 114.   LKW: 14:30 with the onset of her symptoms TNK given?: yes, patient was given TNKase after CTH was reviewed by neuroradiology and attending neurologist. Risks and benefits of TNKase discussed with patient at bedside who agreed to proceed with the medication and TNK was given at 15:25 with a blood pressure reading of 150/80.  IR Thrombectomy? No, patient's creatinine result is elevated and based on her presentation, attending neurologist did not feel that she would be a candidate for thrombectomy without unilateral extremity weakness.  Modified Rankin Scale: 0-Completely asymptomatic and back to baseline post- stroke  ROS: A complete ROS was performed and is negative except as noted in the HPI.   Past Medical History:  Diagnosis Date   CHF (congestive heart failure) (HCC)    Hypertension    Overweight(278.02)    Sciatica    Secondary cardiomyopathy, unspecified    EF now 50%   Past Surgical History:  Procedure Laterality Date   ABDOMINAL HYSTERECTOMY     Family History  Problem Relation Age of Onset   Cerebral aneurysm Mother 71        ruptured   Hypertension Mother        severe   Heart attack Father 47   Coronary artery disease Other    Hypertension Brother        severe   Colon cancer Neg Hx    Breast cancer Neg Hx    Social History:   reports that she has never smoked. She has never used smokeless tobacco. She reports current alcohol use of about 7.0 - 14.0 standard drinks per week. She reports that she does not use drugs.  Medications  Current Facility-Administered Medications:    sodium chloride flush (NS) 0.9 % injection 3 mL, 3 mL, Intravenous, Once, Tegeler, Gwenyth Allegra, MD   tenecteplase [facility]) injection for Stroke 24 mg, 0.25 mg/kg, Intravenous, Once, Leonel Ramsay, Vida Roller, MD  Current Outpatient Medications:    acetaminophen (TYLENOL) 325 MG tablet, Take 2 tablets (650 mg total) by mouth every 4 (four) hours as needed for headache or mild pain., Disp: , Rfl:    amLODipine (NORVASC) 5 MG tablet, Take 1 tablet (5 mg total) by mouth daily., Disp: 90 tablet, Rfl: 2   atorvastatin (LIPITOR) 40 MG tablet, Take 1 tablet (40 mg total) by mouth daily. NEED OV LAST ATTEMPT., Disp: 15 tablet, Rfl: 0   carvedilol (COREG) 12.5 MG tablet, TAKE 1 TABLET(12.5 MG) BY MOUTH TWICE DAILY WITH A MEAL. FOLLOW UP OFFICE VISIT TO RECEIVE FURTHER REFILLS. THANK YOU, Disp: 60 tablet, Rfl: 11   HYDROcodone-acetaminophen (NORCO/VICODIN) 5-325 MG tablet, Take 1 tablet by mouth  every 6 (six) hours as needed., Disp: , Rfl:    meloxicam (MOBIC) 7.5 MG tablet, Take 1 tablet (7.5 mg total) by mouth daily., Disp: 15 tablet, Rfl: 0   methylPREDNISolone (MEDROL DOSEPAK) 4 MG TBPK tablet, Take as directed, Disp: 21 each, Rfl: 0   sacubitril-valsartan (ENTRESTO) 49-51 MG, Take 1 tablet by mouth 2 (two) times daily., Disp: 60 tablet, Rfl: 11   spironolactone (ALDACTONE) 25 MG tablet, Take 25 mg by mouth daily., Disp: , Rfl:    terbinafine (LAMISIL) 250 MG tablet, Take 1 tablet (250 mg total) by mouth daily., Disp: 30 tablet, Rfl: 0    zolpidem (AMBIEN) 5 MG tablet, TK 1 T PO QHS PRF INSOMNIA, Disp: , Rfl: 1  Exam: Current vital signs: Ht 5\' 6"  (1.676 m)    Wt 97.6 kg    BMI 34.73 kg/m  Vital signs in last 24 hours: Weight:  [97.6 kg] 97.6 kg (02/09 1513)  GENERAL: Awake, alert, in no acute distress Psych: Affect appropriate for situation, patient is calm and cooperative with examination Head: Normocephalic and atraumatic, without obvious abnormality EENT: Normal conjunctivae, dry mucous membranes, no OP obstruction LUNGS: Normal respiratory effort. Non-labored breathing on room air CV: Regular rate and rhythm on telemetry ABDOMEN: Soft, non-tender, non-distended Extremities: warm, well perfused, without obvious deformity  NEURO:  Mental Status: Awake, alert, and oriented to person, place, time, and situation. She has significant dysarthria and some aphasia on exam but is able to nod / shake appropriately to questions and is able to answer some questions appropriately with one-word answers. Most of her speech is unintelligible. She is able to communicate her name, age, month, and year correctly.  She is able to name objects but is nonfluent and mostly incomprehensible.  No neglect is noted Cranial Nerves:  II: PERRL 4 mm/brisk. Visual fields full.  III, IV, VI: EOMI without ptosis, nystagmus, or gaze preference V: Sensation is intact to light touch and symmetrical to face.  VII: Face is asymmetric with right mouth droop VIII: Hearing is intact to voice IX, X: Palate elevation is symmetric. Phonation normal.  XI: Normal sternocleidomastoid and trapezius muscle strength XII: Tongue protrudes midline without fasciculations.   Motor: 5/5 strength is all muscle groups without vertical drift. No asymmetry noted.  Tone is normal. Bulk is normal.  Sensation: Intact to light touch bilaterally in all four extremities. No extinction to DSS present.  Coordination: FTN intact bilaterally. HKS intact bilaterally.  Gait:  Deferred  NIHSS @ 15:15: 1a Level of Conscious.: 0 1b LOC Questions: 0 1c LOC Commands: 0 2 Best Gaze: 0 3 Visual: 0 4 Facial Palsy: 1 5a Motor Arm - left: 0 5b Motor Arm - Right: 0 6a Motor Leg - Left: 0 6b Motor Leg - Right: 0 7 Limb Ataxia: 0 8 Sensory: 0 9 Best Language: 1 10 Dysarthria: 2 11 Extinct. and Inatten.: 0 TOTAL: 4  Labs I have reviewed labs in epic and the results pertinent to this consultation are: CBC    Component Value Date/Time   WBC 6.5 12/13/2021 1513   RBC 3.85 (L) 12/13/2021 1513   HGB 12.6 12/13/2021 1515   HCT 37.0 12/13/2021 1515   PLT 259 12/13/2021 1513   MCV 92.2 12/13/2021 1513   MCH 30.6 12/13/2021 1513   MCHC 33.2 12/13/2021 1513   RDW 14.5 12/13/2021 1513   LYMPHSABS 1.9 12/13/2021 1513   MONOABS 0.9 12/13/2021 1513   EOSABS 0.2 12/13/2021 1513   BASOSABS 0.0 12/13/2021 1513  CMP     Component Value Date/Time   NA 142 12/13/2021 1515   K 4.7 12/13/2021 1515   CL 115 (H) 12/13/2021 1515   CO2 28 08/11/2015 0905   GLUCOSE 111 (H) 12/13/2021 1515   BUN 42 (H) 12/13/2021 1515   CREATININE 2.30 (H) 12/13/2021 1515   CREATININE 1.53 (H) 08/11/2015 0905   CALCIUM 8.9 08/11/2015 0905   PROT 7.3 08/11/2015 0905   ALBUMIN 4.1 08/11/2015 0905   AST 17 08/11/2015 0905   ALT 13 08/11/2015 0905   ALKPHOS 100 08/11/2015 0905   BILITOT 0.6 08/11/2015 0905   GFRNONAA 29 (L) 01/30/2015 1145   GFRAA 34 (L) 01/30/2015 1145   Lipid Panel     Component Value Date/Time   CHOL 211 (H) 08/11/2015 0905   TRIG 68 08/11/2015 0905   HDL 66 08/11/2015 0905   CHOLHDL 3.2 08/11/2015 0905   VLDL 14 08/11/2015 0905   LDLCALC 131 (H) 08/11/2015 0905   Lab Results  Component Value Date   HGBA1C 5.9 (H) 08/06/2014   Imaging I have reviewed the images obtained:  CT-scan of the brain 2/9: 1. Age indeterminate small lacunar infarct in the left corona radiata. 2. No evidence of acute large vascular territory infarct or acute hemorrhage. 3. Age  advanced white matter hypoattenuation, nonspecific but potentially related to chronic microvascular disease or demyelination. MRI with contrast could further evaluate if clinically indicated.  Assessment: 60 year old female who presented to the ED via EMS on 2/9 for evaluation of acute onset of right facial droop, aphasia, and dysarthria) by the patient's coworkers with onset at 1430 today. - Examination revealed patient with a right mouth droop, severe dysarthria, mild aphasia with an NIHSS of 4. - TNKase was given following CT imaging review without evidence of acute hemorrhage. - CT imaging as above. - Patient's stroke risk factors include obesity with a BMI of 34.73 kg/m, CHF, and hypertension.  Plan: Acute Ischemic Stroke  Acuity: Acute Current Suspected Etiology: Continue Evaluation:  -Admit to: ICU -Hold Aspirin until 24 hour post tPA neuroimaging is stable and without evidence of bleeding -Blood pressure control, goal of SYS <180 -MRI/ECHO/A1C/Lipid panel. -Hyperglycemia management per SSI to maintain glucose 140-180mg /dL. 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  On exam she has right facial droop, severe dysarthria.  Though difficult to tell given her  degree of dysarthria, she does appear to have some word finding difficulty in addition to the dysarthria and therefore I suspect a mild aphasia.  Given the degree of her dysarthria, I feel this is a disabling deficit and discussed the benefits and risks of IV thrombolytics with the patient.  She expressed desire to proceed with IV thrombolytics, in tenecteplase was started.  Etiology is currently unclear, she will need to be monitored in the ICU setting.  She has acute renal failure, and currently her exam would not merit acute intervention and therefore will get MRI brain MRA head and neck.  If she were to worsen, would consider CTA head and neck despite creatinine elevation.  This patient is critically ill and at significant risk of neurological worsening, death and care requires constant monitoring of vital signs, hemodynamics,respiratory and cardiac monitoring, neurological assessment, discussion with family, other specialists and medical decision making of high complexity. I spent 55 minutes of neurocritical care time  in the care of  this patient. This was time spent independent of any time provided by nurse practitioner or PA.  Roland Rack, MD [facility] [phone]  If 7pm- 7am, please page neurology on call as listed in [place]. 12/13/2021  8:17 PM

## 2021-12-13 NOTE — Progress Notes (Signed)
PHARMACIST CODE STROKE RESPONSE  Notified to mix TNK at 1518 by Dr. Leonel Ramsay Ready at 1521 -- Instructed to hold off administering until neuro d/w readiology 1525 - ok'd by neuro Delivered TNK to RN at 1525  TNK dose = 24 mg IV over 5 seconds  Issues/delays encountered (if applicable): see above  Heloise Purpura 12/13/21 3:27 PM

## 2021-12-13 NOTE — ED Triage Notes (Signed)
Pt BIB [facility] from work after suffering a R sided facial droop and slurred speech at 1430 today.

## 2021-12-13 NOTE — Progress Notes (Signed)
Pt w/ small amt bleeding in mouth. Also faint snore sound when breathingalthough she is wide awake. MD at bedside, will cont to monitor.

## 2021-12-13 NOTE — ED Provider Notes (Signed)
[facility] EMERGENCY DEPARTMENT Provider Note   CSN: [id number] Arrival date & time: 12/13/21  1505     History  No chief complaint on file.   [name] is a 61 y.o. female.  The history is provided by the patient and medical records. No language interpreter was used.  Cerebrovascular Accident This is a new problem. The current episode started 1 to 2 hours ago. The problem occurs constantly. The problem has not changed since onset.Pertinent negatives include no chest pain, no abdominal pain, no headaches and no shortness of breath. Nothing aggravates the symptoms. Nothing relieves the symptoms. She has tried nothing for the symptoms. The treatment provided no relief.      Home Medications Prior to Admission medications   Medication Sig Start Date End Date Taking? Authorizing Provider  acetaminophen (TYLENOL) 325 MG tablet Take 2 tablets (650 mg total) by mouth every 4 (four) hours as needed for headache or mild pain. 08/09/14   Erlene Quan, PA-C  amLODipine (NORVASC) 5 MG tablet Take 1 tablet (5 mg total) by mouth daily. 05/03/21   Minus Breeding, MD  atorvastatin (LIPITOR) 40 MG tablet Take 1 tablet (40 mg total) by mouth daily. NEED OV LAST ATTEMPT. 10/12/20   Minus Breeding, MD  carvedilol (COREG) 12.5 MG tablet TAKE 1 TABLET(12.5 MG) BY MOUTH TWICE DAILY WITH A MEAL. FOLLOW UP OFFICE VISIT TO RECEIVE FURTHER REFILLS. [place] YOU 02/26/21   Minus Breeding, MD  HYDROcodone-acetaminophen (NORCO/VICODIN) 5-325 MG tablet Take 1 tablet by mouth every 6 (six) hours as needed. 05/31/20   [provider]  meloxicam (MOBIC) 7.5 MG tablet Take 1 tablet (7.5 mg total) by mouth daily. 03/08/21   Pearson Forster, NP  methylPREDNISolone (MEDROL DOSEPAK) 4 MG TBPK tablet Take as directed 07/11/21   Felipa Furnace, DPM  sacubitril-valsartan (ENTRESTO) 49-51 MG Take 1 tablet by mouth 2 (two) times daily. 02/07/21   Minus Breeding, MD  spironolactone (ALDACTONE) 25 MG tablet  Take 25 mg by mouth daily.    [provider]  terbinafine (LAMISIL) 250 MG tablet Take 1 tablet (250 mg total) by mouth daily. 07/17/20   Edrick Kins, DPM  zolpidem (AMBIEN) 5 MG tablet TK 1 T PO QHS PRF INSOMNIA 01/17/18   [provider]      Allergies    Latex    Review of Systems   Review of Systems  Constitutional:  Negative for chills, fatigue and fever.  HENT:  Negative for congestion.   Eyes:  Negative for visual disturbance.  Respiratory:  Negative for chest tightness and shortness of breath.   Cardiovascular:  Negative for chest pain.  Gastrointestinal:  Negative for abdominal pain, constipation, diarrhea, nausea and vomiting.  Genitourinary:  Negative for dysuria, flank pain and frequency.  Musculoskeletal:  Negative for back pain, neck pain and neck stiffness.  Skin:  Negative for rash and wound.  Neurological:  Positive for facial asymmetry and speech difficulty. Negative for headaches.  Psychiatric/Behavioral:  Negative for agitation and confusion.   All other systems reviewed and are negative.  Physical Exam Updated Vital Signs BP (!) 150/93    Pulse 86    Resp 17    Ht 5\' 6"  (1.676 m)    Wt 97.6 kg    SpO2 100%    BMI 34.73 kg/m  Physical Exam Vitals and nursing note reviewed.  Constitutional:      General: She is not in acute distress.    Appearance:  She is well-developed. She is not ill-appearing, toxic-appearing or diaphoretic.  HENT:     Head: Atraumatic.     Nose: No congestion or rhinorrhea.     Mouth/Throat:     Mouth: Mucous membranes are moist.  Eyes:     General: No visual field deficit.    Conjunctiva/sclera: Conjunctivae normal.  Cardiovascular:     Rate and Rhythm: Normal rate and regular rhythm.     Heart sounds: No murmur heard. Pulmonary:     Effort: Pulmonary effort is normal. No respiratory distress.     Breath sounds: Normal breath sounds. No wheezing, rhonchi or rales.  Chest:     Chest wall: No tenderness.   Abdominal:     Palpations: Abdomen is soft.     Tenderness: There is no abdominal tenderness. There is no guarding or rebound.  Musculoskeletal:        General: No swelling or tenderness.     Cervical back: Neck supple.  Skin:    General: Skin is warm and dry.     Capillary Refill: Capillary refill takes less than 2 seconds.  Neurological:     Mental Status: She is alert.     Cranial Nerves: Facial asymmetry present.     Sensory: No sensory deficit.     Motor: Weakness present.     Comments: Right facial droop and aphasia.  Otherwise no focal neurologic deficits on initial exam.  Psychiatric:        Mood and Affect: Mood normal.    ED Results / Procedures / Treatments   Labs (all labs ordered are listed, but only abnormal results are displayed) Labs Reviewed  CBC - Abnormal; Notable for the following components:      Result Value   RBC 3.85 (*)    Hemoglobin 11.8 (*)    HCT 35.5 (*)    All other components within normal limits  COMPREHENSIVE METABOLIC PANEL - Abnormal; Notable for the following components:   Chloride 113 (*)    CO2 18 (*)    Glucose, Bld 113 (*)    BUN 41 (*)    Creatinine, Ser 2.21 (*)    GFR, Estimated 25 (*)    All other components within normal limits  POCT I-STAT, CHEM 8 - Abnormal; Notable for the following components:   Chloride 115 (*)    BUN 42 (*)    Creatinine, Ser 2.30 (*)    Glucose, Bld 111 (*)    TCO2 20 (*)    All other components within normal limits  PROTIME-INR  APTT  DIFFERENTIAL  HIV ANTIBODY (ROUTINE TESTING W REFLEX)  RAPID URINE DRUG SCREEN, HOSP PERFORMED  URINALYSIS, COMPLETE (UACMP) WITH MICROSCOPIC  ETHANOL  I-STAT CHEM 8, ED  CBG MONITORING, ED  I-STAT BETA HCG BLOOD, ED ([facility], [facility], [facility] ONLY)  I-STAT BETA HCG BLOOD, ED (NOT ORDERABLE)    EKG None   Radiology CT HEAD CODE STROKE WO CONTRAST  Result Date: 12/13/2021 CLINICAL DATA:  Code stroke.  Neuro deficit, acute, stroke suspected EXAM: CT HEAD WITHOUT  CONTRAST TECHNIQUE: Contiguous axial images were obtained from the base of the skull through the vertex without intravenous contrast. RADIATION DOSE REDUCTION: This exam was performed according to the departmental dose-optimization program which includes automated exposure control, adjustment of the mA and/or kV according to patient size and/or use of iterative reconstruction technique. COMPARISON:  None. FINDINGS: Brain: No evidence of acute large vascular territory infarction, hemorrhage, hydrocephalus, extra-axial collection or mass lesion/mass effect. Small  age indeterminate infarct in the left corona radiata. Remote appearing infarct in the left basal ganglia. Additional patchy white matter hypoattenuation, age advanced. Vascular: No hyperdense vessel identified. Skull: No acute fracture. Sinuses/Orbits: Inferior maxillary sinus mucosal thickening with retention cyst versus polyp in the right maxillary sinus. Unremarkable orbits. Other: No mastoid effusions. IMPRESSION: 1. Age indeterminate small lacunar infarct in the left corona radiata. 2. No evidence of acute large vascular territory infarct or acute hemorrhage. 3. Age advanced white matter hypoattenuation, nonspecific but potentially related to chronic microvascular disease or demyelination. MRI with contrast could further evaluate if clinically indicated. Findings discussed with Dr. Leonel Ramsay via telephone at 3:22 p.m. Electronically Signed   By: Margaretha Sheffield M.D.   On: 12/13/2021 15:25    Procedures Procedures    CRITICAL CARE Performed by: Gwenyth Allegra Davey Limas Total critical care time: 20 minutes Critical care time was exclusive of separately billable procedures and treating other patients. Critical care was necessary to treat or prevent imminent or life-threatening deterioration. Critical care was time spent personally by me on the following activities: development of treatment plan with patient and/or surrogate as well as nursing,  discussions with consultants, evaluation of patient's response to treatment, examination of patient, obtaining history from patient or surrogate, ordering and performing treatments and interventions, ordering and review of laboratory studies, ordering and review of radiographic studies, pulse oximetry and re-evaluation of patient's condition.   Medications Ordered in ED Medications  sodium chloride flush (NS) 0.9 % injection 3 mL (has no administration in time range)  tenecteplase (TNKASE) injection for Stroke 24 mg (24 mg Intravenous Given 12/13/21 1525)    ED Course/ Medical Decision Making/ A&P                           Medical Decision Making Amount and/or Complexity of Data Reviewed Labs: ordered. Radiology: ordered.  Risk Decision regarding hospitalization.    [name] is a 61 y.o. female past medical history significant for hypertension, CHF, CKD, hyperlipidemia, and insomnia who presents as a code stroke.  According to EMS report, patient was at her baseline until 2:30 PM when she was sitting with coworkers and started having difficulty speaking and right facial droop.  She quickly was brought in for evaluation and taken to CT scanner.  Patient is able to answer some questions yes and no and says that she was not having any headache or neck pain.  She denies recent trauma.  She denies any preceding symptoms such as fevers, chills, [place], cough, nausea, vomiting, or other complaints.  On my exam, patient has symmetric strength and sensation in arms and legs but does have some right facial droop.  Sensation is intact throughout the face.  Pupils are symmetric and reactive normal extraocular movements.  Airway is protected currently.  Neurology had already seen patient by the time I evaluated her and they have already given TNKase for acute stroke.  They will admit to neuro ICU for further management of acute stroke.  Due to her poor kidney function, she could not get the CT and  perfusion, thus she will get MRI and MRA for further evaluation.  She will be admitted by neurology for further management.          Final Clinical Impression(s) / ED Diagnoses Final diagnoses:  Cerebrovascular accident (CVA), unspecified mechanism ([place])  Facial droop  Aphasia due to acute stroke ([place])   \ Clinical Impression: 1. Cerebrovascular accident (CVA),  unspecified mechanism ([place])   2. Facial droop   3. Aphasia due to acute stroke [facility])     Disposition: Admit  This note was prepared with assistance of Dragon voice recognition software. Occasional wrong-word or sound-a-like substitutions may have occurred due to the inherent limitations of voice recognition software.     Bellina Tokarczyk, Gwenyth Allegra, MD 12/13/21 [phone]

## 2021-12-13 NOTE — Code Documentation (Addendum)
[name] is a 61 yr old female with a history of CHF, Cardiomyopathy and renal insufficiency. She is on no blood thinners. [name] had a sudden onset of right facial droop and aphasia today at 1430 while at work. EMS was called, and Code stroke was activated at 1452. Pt arrived [facility] at 1505.She was alert, MAE well, but dysarthric. She had a facial droop as well. (See flowsheet for full NIHSS and times). Airway cleared, labs drawn at bridge. Pt taken to CT at 1510. CTNC neg for acute hemorrhage per Dr Leonel Ramsay. CBG obtained in CT scan was 99. Pt has no contraindications to thrombolytic, and consents to TNK. TNK given at 1525. CTA NOT obtained due to elevated creatinine, and pt not candidate for NIR as no weakness. Pt returned to ED room 9. She will need q 15 min VS and neuro checks for 2 hrs, then q 30 min for 6 hrs, then q 1 hr. Bedside handoff with Romelle Starcher RN complete.

## 2021-12-14 ENCOUNTER — Inpatient Hospital Stay (HOSPITAL_COMMUNITY): Payer: Managed Care, Other (non HMO)

## 2021-12-14 ENCOUNTER — Other Ambulatory Visit (HOSPITAL_COMMUNITY): Payer: Managed Care, Other (non HMO)

## 2021-12-14 DIAGNOSIS — I10 Essential (primary) hypertension: Secondary | ICD-10-CM

## 2021-12-14 DIAGNOSIS — I5043 Acute on chronic combined systolic (congestive) and diastolic (congestive) heart failure: Secondary | ICD-10-CM | POA: Diagnosis not present

## 2021-12-14 DIAGNOSIS — E7800 Pure hypercholesterolemia, unspecified: Secondary | ICD-10-CM

## 2021-12-14 DIAGNOSIS — I5023 Acute on chronic systolic (congestive) heart failure: Secondary | ICD-10-CM

## 2021-12-14 DIAGNOSIS — I639 Cerebral infarction, unspecified: Secondary | ICD-10-CM

## 2021-12-14 LAB — COMPREHENSIVE METABOLIC PANEL
ALT: 13 U/L (ref 0–44)
AST: 14 U/L — ABNORMAL LOW (ref 15–41)
Albumin: 3.3 g/dL — ABNORMAL LOW (ref 3.5–5.0)
Alkaline Phosphatase: 93 U/L (ref 38–126)
Anion gap: 8 (ref 5–15)
BUN: 37 mg/dL — ABNORMAL HIGH (ref 6–20)
CO2: 19 mmol/L — ABNORMAL LOW (ref 22–32)
Calcium: 8.9 mg/dL (ref 8.9–10.3)
Chloride: 115 mmol/L — ABNORMAL HIGH (ref 98–111)
Creatinine, Ser: 1.98 mg/dL — ABNORMAL HIGH (ref 0.44–1.00)
GFR, Estimated: 29 mL/min — ABNORMAL LOW (ref >60)
Glucose, Bld: 97 mg/dL (ref 70–99)
Potassium: 4.1 mmol/L (ref 3.5–5.1)
Sodium: 142 mmol/L (ref 135–145)
Total Bilirubin: 0.8 mg/dL (ref 0.3–1.2)
Total Protein: 6.3 g/dL — ABNORMAL LOW (ref 6.5–8.1)

## 2021-12-14 LAB — LIPID PANEL
Cholesterol: 172 mg/dL (ref 0–200)
HDL: 60 mg/dL (ref >40)
LDL Cholesterol: 101 mg/dL — ABNORMAL HIGH (ref 0–99)
Total CHOL/HDL Ratio: 2.9 RATIO
Triglycerides: 53 mg/dL (ref <150)
VLDL: 11 mg/dL (ref 0–40)

## 2021-12-14 LAB — CBC
HCT: 31.4 % — ABNORMAL LOW (ref 36.0–46.0)
Hemoglobin: 10.6 g/dL — ABNORMAL LOW (ref 12.0–15.0)
MCH: 30.6 pg (ref 26.0–34.0)
MCHC: 33.8 g/dL (ref 30.0–36.0)
MCV: 90.8 fL (ref 80.0–100.0)
Platelets: 230 10*3/uL (ref 150–400)
RBC: 3.46 MIL/uL — ABNORMAL LOW (ref 3.87–5.11)
RDW: 14.5 % (ref 11.5–15.5)
WBC: 5.6 10*3/uL (ref 4.0–10.5)
nRBC: 0 % (ref 0.0–0.2)

## 2021-12-14 LAB — ECHOCARDIOGRAM COMPLETE
AR max vel: 1.59 cm2
AV Area VTI: 1.71 cm2
AV Area mean vel: 1.49 cm2
AV Mean grad: 4 mmHg
AV Peak grad: 8.1 mmHg
Ao pk vel: 1.42 m/s
Area-P 1/2: 7.09 cm2
Height: 66 in
S' Lateral: 5.3 cm
Weight: 3333.36 oz

## 2021-12-14 LAB — HEMOGLOBIN A1C
Hgb A1c MFr Bld: 5.7 % — ABNORMAL HIGH (ref 4.8–5.6)
Mean Plasma Glucose: 116.89 mg/dL

## 2021-12-14 LAB — ECHOCARDIOGRAM LIMITED
Calc EF: 23.8 %
Height: 66 in
Single Plane A2C EF: 24.5 %
Single Plane A4C EF: 22.8 %
Weight: 3333.36 oz

## 2021-12-14 MED ORDER — ASPIRIN EC 325 MG PO TBEC
325.0000 mg | DELAYED_RELEASE_TABLET | Freq: Every day | ORAL | Status: DC
  Administered 2021-12-14 – 2021-12-18 (×4): 325 mg via ORAL
  Filled 2021-12-14 (×5): qty 1

## 2021-12-14 MED ORDER — PERFLUTREN LIPID MICROSPHERE
1.0000 mL | INTRAVENOUS | Status: DC | PRN
  Administered 2021-12-14: 2 mL via INTRAVENOUS
  Filled 2021-12-14: qty 10

## 2021-12-14 MED ORDER — ATORVASTATIN CALCIUM 80 MG PO TABS
80.0000 mg | ORAL_TABLET | Freq: Every day | ORAL | Status: DC
  Administered 2021-12-14 – 2021-12-18 (×4): 80 mg via ORAL
  Filled 2021-12-14 (×5): qty 1

## 2021-12-14 MED ORDER — CARVEDILOL 12.5 MG PO TABS
12.5000 mg | ORAL_TABLET | Freq: Two times a day (BID) | ORAL | Status: DC
  Administered 2021-12-15 – 2021-12-18 (×7): 12.5 mg via ORAL
  Filled 2021-12-14 (×7): qty 1

## 2021-12-14 NOTE — Evaluation (Signed)
Speech Language Pathology Evaluation Patient Details Name: [name] MRN: [id number] DOB: 1960-11-17 Today's Date: 12/14/2021 Time: 6945-0388 SLP Time Calculation (min) (ACUTE ONLY): 14 min  Problem List:  Patient Active Problem List   Diagnosis Date Noted   Acute ischemic stroke ([place]) 12/13/2021   Hyperlipidemia 02/05/2019   Advice given about COVID-19 virus infection 02/05/2019   CKD (chronic kidney disease), stage III ([place]) 02/03/2018   Insomnia 10/14/2014   At risk for sudden cardiac death 82/80/0349   Chronic systolic HF (heart failure) ([place]) 08/22/2014   Essential hypertension, malignant 08/08/2014   Non compliance with medical treatment 08/07/2014   Normal coronary arteries 2006 08/07/2014   Renal insufficiency- unclear if chronic or not 08/07/2014   Acute on chronic systolic CHF (congestive heart failure), NYHA class 4 ([place]) 08/06/2014   DYSPNEA 07/04/2010   Chest pain- MI r/o 05/24/2009   Obesity (BMI 30-39.9) 02/13/2009   Uncontrolled hypertension 02/13/2009   NICM- EF 25-30% echo 08/05/14 02/13/2009   Past Medical History:  Past Medical History:  Diagnosis Date   CHF (congestive heart failure) ([place])    Hypertension    Overweight(278.02)    Sciatica    Secondary cardiomyopathy, unspecified    EF now 50%   Past Surgical History:  Past Surgical History:  Procedure Laterality Date   ABDOMINAL HYSTERECTOMY     HPI:  Pt is a 61 y.o. female who presented to the ED on 2/9 secondary to right facial droop, slurred speech and word retrieval difficulty. CT head 2/9: Age indeterminate small lacunar infarct in the left corona radiata. TNK given. Pt found to have COVID-19. PMH: essential hypertension, CHF, obesity with a BMI of 34.73 kg/m, and secondary cardiomyopathy.   Assessment / Plan / Recommendation Clinical Impression  Pt participated in speech/language/cognition evaluation evaluation with her daughter present. 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 Pt's motor speech and language skills were WNL. [facility] Mental Status Examination was completed to evaluate the pt's cognitive-linguistic skills. She achieved a score of 29/30 which is within the normal limits of 27 or more out of 30, and her performance on informal cognitive-linguistic tasks was also WNL. Further skilled SLP services are not clinically indicated at this time. Pt, her daughter, and RN were educated regarding this and she verbalized understanding as well as agreement with plan of care.    SLP Assessment  SLP Recommendation/Assessment: Patient does not need any further Speech [place] [facility] SLP Visit Diagnosis: Dysarthria and anarthria (R47.1)    Recommendations for follow up therapy are one component of a multi-disciplinary discharge planning process, led by the attending physician.  Recommendations may be updated based on patient status, additional functional criteria and insurance authorization.    Follow Up Recommendations  No SLP follow up    Assistance Recommended at Discharge  None  Functional Status Assessment Patient has not had a recent decline in their functional status  Frequency and Duration           SLP Evaluation Cognition  Overall Cognitive Status: Within Functional Limits for tasks assessed Arousal/Alertness: Awake/alert Orientation Level: Oriented X4 Year: 2023 Month: February Day of Week: Correct Attention: Focused;Sustained Focused Attention: Appears intact Sustained Attention: Appears intact Memory: Appears intact (Immediate & delayed: 5/5) Awareness: Appears intact Problem Solving: Appears intact (Money: 3/3) Executive Function: Sequencing;Organizing Sequencing: International aid/development worker:  (Backward digit span: 1/2)  Comprehension  Auditory Comprehension Overall Auditory Comprehension: Appears within functional limits  for tasks assessed Yes/No Questions: Within Functional Limits Commands: Within Functional Limits Conversation: Complex Reading Comprehension Reading Status: Within funtional limits    Expression Expression Primary Mode of Expression: Verbal Verbal Expression Overall Verbal Expression: Appears within functional limits for tasks assessed Initiation: No impairment Level of Generative/Spontaneous Verbalization: Conversation Repetition: No impairment Naming: No impairment Pragmatics: No impairment   Oral / Motor  Oral Motor/Sensory Function Overall Oral Motor/Sensory Function: Within functional limits Motor Speech Overall Motor Speech: Appears within functional limits for tasks assessed Respiration: Within functional limits Phonation: Normal Resonance: Within functional limits Articulation: Within functional limitis Intelligibility: Intelligible Motor Planning: Witnin functional limits Motor Speech Errors: Not applicable           Kamill Fulbright I. Hardin Negus, [place], [place] Office number [phone] Pager [place] 12/14/2021, 1:29 PM

## 2021-12-14 NOTE — Progress Notes (Addendum)
STROKE TEAM PROGRESS NOTE   INTERVAL HISTORY Her family is at the bedside. EF 20-25% on most recent echo. Cardiology consult placed.   Vitals:   12/14/21 0400 12/14/21 0500 12/14/21 0600 12/14/21 0700  BP: 131/86 130/90 120/75 124/87  Pulse: 87 85 77 83  Resp: 17 17 11 14   Temp: 98.3 F (36.8 C)     TempSrc: Oral     SpO2: 98% 98% 99% 98%  Weight:      Height:       CBC:  Recent Labs  Lab 12/13/21 1513 12/13/21 1515 12/14/21 0354  WBC 6.5  --  5.6  NEUTROABS 3.5  --   --   HGB 11.8* 12.6 10.6*  HCT 35.5* 37.0 31.4*  MCV 92.2  --  90.8  PLT 259  --  779   Basic Metabolic Panel:  Recent Labs  Lab 12/13/21 1513 12/13/21 1515 12/14/21 0354  NA 142 142 142  K 4.6 4.7 4.1  CL 113* 115* 115*  CO2 18*  --  19*  GLUCOSE 113* 111* 97  BUN 41* 42* 37*  CREATININE 2.21* 2.30* 1.98*  CALCIUM 9.2  --  8.9   Lipid Panel:  Recent Labs  Lab 12/14/21 0354  CHOL 172  TRIG 53  HDL 60  CHOLHDL 2.9  VLDL 11  LDLCALC 101*   HgbA1c:  Recent Labs  Lab 12/14/21 0354  HGBA1C 5.7*   Urine Drug Screen: No results for input(s): LABOPIA, COCAINSCRNUR, LABBENZ, AMPHETMU, THCU, LABBARB in the last 168 hours.  Alcohol Level  Recent Labs  Lab 12/13/21 1505  ETH <10    IMAGING past 24 hours CT HEAD CODE STROKE WO CONTRAST  Result Date: 12/13/2021 CLINICAL DATA:  Code stroke.  Neuro deficit, acute, stroke suspected EXAM: CT HEAD WITHOUT CONTRAST TECHNIQUE: Contiguous axial images were obtained from the base of the skull through the vertex without intravenous contrast. RADIATION DOSE REDUCTION: This exam was performed according to the departmental dose-optimization program which includes automated exposure control, adjustment of the mA and/or kV according to patient size and/or use of iterative reconstruction technique. COMPARISON:  None. FINDINGS: Brain: No evidence of acute large vascular territory infarction, hemorrhage, hydrocephalus, extra-axial collection or mass lesion/mass  effect. Small age indeterminate infarct in the left corona radiata. Remote appearing infarct in the left basal ganglia. Additional patchy white matter hypoattenuation, age advanced. Vascular: No hyperdense vessel identified. Skull: No acute fracture. Sinuses/Orbits: Inferior maxillary sinus mucosal thickening with retention cyst versus polyp in the right maxillary sinus. Unremarkable orbits. Other: No mastoid effusions. IMPRESSION: 1. Age indeterminate small lacunar infarct in the left corona radiata. 2. No evidence of acute large vascular territory infarct or acute hemorrhage. 3. Age advanced white matter hypoattenuation, nonspecific but potentially related to chronic microvascular disease or demyelination. MRI with contrast could further evaluate if clinically indicated. Findings discussed with Dr. Leonel Ramsay via telephone at 3:22 p.m. Electronically Signed   By: Margaretha Sheffield M.D.   On: 12/13/2021 15:25    PHYSICAL EXAM  Physical Exam  Constitutional: Appears well-developed and well-nourished.  Psych: Affect appropriate to situation Eyes: No scleral injection HENT: No OP obstrucion MSK: no joint deformities.  Cardiovascular: Normal rate and regular rhythm.  Respiratory: Effort normal, non-labored breathing GI: Soft.  No distension. There is no tenderness.  Skin: WDI  Neuro: Mental Status: Patient is awake, alert, oriented to person, place, month, year, and situation. Patient is able to give a clear and coherent history. No signs of aphasia or neglect  Cranial Nerves: II: Visual Fields are full. Pupils are equal, round, and reactive to light.   III,IV, VI: EOMI without ptosis or diploplia.  V: Facial sensation is symmetric to temperature VII: Facial movement is symmetric resting and smiling VIII: Hearing is intact to voice X: Palate elevates symmetrically XI: Shoulder shrug is symmetric. XII: Tongue protrudes midline without atrophy or fasciculations.  Motor: Tone is normal. Bulk  is normal. 5/5 strength was present in all extremities. Sensory: Sensation is symmetric to light touch and temperature in the arms and legs. No extinction to DSS present.  Deep Tendon Reflexes: 2+ and symmetric in the biceps and patellae.  Plantars: Toes are downgoing bilaterally.  Cerebellar: FNF and HKS are intact bilaterally    ASSESSMENT/PLAN [name] is a 61 y.o. female with history of essential hypertension, CHF, obesity with a BMI of 34.73 kg/m, and secondary cardiomyopathy presenting with an acute onset of right facial droop and speech disturbance. TNKase was administered on 2/9 1525. Initial NIHss 4 for facial droop and dysarthria.   Stroke: left MCA infarct, embolic pattern, likely due to cardiomyopathy with low EF Code Stroke Age indeterminate small lacunar infarct in the left corona radiata. Age advanced white matter hypoattenuation, nonspecific but potentially related to chronic microvascular disease or demyelination. MRI with contrast could further evaluate if clinically indicated. MRI  Pending MRA  Pending 2D Echo 20 to 25%. The left ventricle has severely decreased function. The left ventricle demonstrates global hypokinesis.  LDL 101 HgbA1c 5.7 VTE prophylaxis - SCDs    Diet   Diet Heart Room service appropriate? Yes; Fluid consistency: Thin   No antithrombotic prior to admission, now on No antithrombotic. Secondary to TNKase. Start DAPT 24 hours after TNK if no HT Therapy recommendations:  pending Disposition:  pending  CHF History of CHF following with cardiology Home medication including Entresto, Coreg, spironolactone EF 20 to 25% Repeat 2D echo with contrast showed no LV thrombus Gradually resume home meds after 24 hours of permissive hypertension Cardiology on board  Hypertension Home meds: carvedilol, amlodipine, spironolactone, Entresto Stable Restarting BP meds after 24 hours of permissive hypertension Long-term BP goal  normotensive  Hyperlipidemia Home meds:  Atorvastatin 40mg , resumed in hospital LDL 101, goal < 70 Continue statin at discharge  Other Stroke Risk Factors Obesity, Body mass index is 33.63 kg/m., BMI >/= 30 associated with increased stroke risk, recommend weight loss, diet and exercise as appropriate  Hx stroke/TIA Age indeterminate small lacunar infarct in the left corona radiata Congestive heart failure Spironolactone Keep patient euvolemic  Other Active Problems Cardiomyopathy- Dr. Percival Spanish patient EF was previously 50-55% Entresto- not restarted yet AKI BUN/Creatinine 37/1.98 GFR 29 COVID positive Incidentally COVID positive, asymptomatic  Hospital day # 1  Patient seen and examined by NP/APP with MD. MD to update note as needed.   Janine Ores, DNP, FNP-BC [facility] Pager: [phone]  ATTENDING NOTE: I reviewed above note and agree with the assessment and plan. Pt was seen and examined.   61 year old female with history of hypertension, CHF, obesity admitted for speech difficulty, expressive aphasia, right facial droop, right arm weakness.  CT no acute abnormality but old left CR lacunar infarct.  MRI, MRA head and neck pending.  EF 20 to 25%, no LV thrombus, LDL 101 and A1c 5.7.  Also found to have COVID PCR positive.  UDS pending.  On exam, patient awake alert, orientated x3, no aphasia, fluent language, able to name and repeat, follows commands.  No  gaze palsy, visual field full, slight right nasolabial fold flattening.  Left UE 5/5 proximal, however distal hand gripping 4/5, right hand decreased dexterity.  Bilateral lower extremity 5/5.  Sensation symmetrical, finger-to-nose intact.  Etiology for patient stroke likely due to CHF, cardiomyopathy with low EF.  Currently not on antithrombotics due to within 24 hours of TNK.  On Lipitor 80, and resume Coreg.  Consider to resume other heart failure medications after 24 hours of permissive hypertension  period.  Consult cardiology for CHF management.  AKI on CKD improving, continue BMP monitoring.  COVID infection asymptomatic, no specific treatment at this point.  For detailed assessment and plan, please refer to above as I have made changes wherever appropriate.   Rosalin Hawking, MD PhD Stroke Neurology 12/14/2021 5:58 PM  This patient is critically ill due to left MCA stroke status post TNK, cardiomyopathy and at significant risk of neurological worsening, death form recurrent stroke, hemorrhagic transformation, bleeding from TN K, heart failure. This patient's care requires constant monitoring of vital signs, hemodynamics, respiratory and cardiac monitoring, review of multiple databases, neurological assessment, discussion with family, other specialists and medical decision making of high complexity. I spent 30 minutes of neurocritical care time in the care of this patient.   To contact Stroke Continuity provider, please refer to [email or web address] After hours, contact General Neurology

## 2021-12-14 NOTE — Progress Notes (Signed)
OT Cancellation Note  Patient Details Name: LUMINA GITTO MRN: [id number] DOB: September 13, 1961   Cancelled Treatment:    Reason Eval/Treat Not Completed: Active bedrest order (TNKase) OT order received and appreciated however this conflicts with current bedrest order set. Please increase activity tolerance as appropriate and remove bedrest from orders. . Please contact OT at [phone] if bed rest order is discontinued. OT will hold evaluation at this time and will check back as time allows pending increased activity orders.   Billey Chang, OTR/L  Acute Rehabilitation Services Pager: [phone] Office: [phone] .  12/14/2021, 6:34 AM

## 2021-12-14 NOTE — Consult Note (Addendum)
Cardiology Consultation:   Patient ID: [name] MRN: [id number]; DOB: 11/08/1960  Admit date: 12/13/2021 Date of Consult: 12/14/2021  PCP:  Velna Hatchet, MD   [facility] Providers Cardiologist:  Minus Breeding, MD       Patient Profile:   [name] is a 61 y.o. female with a hx of nonischemic hypertensive cardiomyopathy improved LV function, morbid obesity, CKD stage III and hypertension who is being seen 12/14/2021 for the evaluation of reduced EF at the request of [name] Ores, NP.  Patient with longstanding history of cardiomyopathy, dating back to 2006 with EF of 30 to 35%.  She had a cardiac catheterization May 2006 showing normal coronaries.  Management of her hypertensive recommended.  Her EF was 25 to 30% by echo in 2015.  She was placed on good medical regimen with improved LV function.  Echo 11/2014: LVEF 40-45% and grade 1 DD Echo 12/2016: LVEF 50-55% and grade 1 DD Echo 06/2018: LVEF 50-55%  Patient was treated with carvedilol 12.5 mg twice daily, spironolactone 25 mg daily and Entresto 49/51 mg twice daily.  Also on amlodipine 5 mg daily.    History of Present Illness:   [name] was in usual state for health up until yesterday afternoon when had sudden onset right-sided facial droop with speech disturbance.  Witnessed by [name].  EMS was called and code stroke activated.  Blood pressure elevated.  CBG 114. CT of head showed Age indeterminate small lacunar infarct in the left corona radiata. Patient was given tPN.  She was found to have COVID incidentally.  Pending MRI and MRA of brain.  Echocardiogram showed severely reduced LV function at 20 to 25% from prior.  Intermediate diastolic dysfunction. There is global hypokinesis, the inferior wall and the  inferior septum appear disproportionately hypokinetic/nearly akinetic.  There is also profound LBBB-related sepatl-lateral dyssynchrony due to  LBBB.  There is a pending limited echo with Definity.  Cardiology is  asked for further evaluation.  Patient reported compliance with medications.  No regular blood pressure check.  It was normal few weeks ago.  Patient used to walk every single day but has not done so in past 6 months.  She denies chest pain, shortness of breath, palpitation, orthopnea, PND, syncope, or melena.  She reports persistent lower extremity edema and takes Lasix as needed.  Denies adding extra salt but does eat food high in salt.  Past Medical History:  Diagnosis Date   CHF (congestive heart failure) (HCC)    Hypertension    Overweight(278.02)    Sciatica    Secondary cardiomyopathy, unspecified    EF now 50%    Past Surgical History:  Procedure Laterality Date   ABDOMINAL HYSTERECTOMY       Inpatient Medications: Scheduled Meds:  Chlorhexidine Gluconate Cloth  6 each Topical Daily   pantoprazole (PROTONIX) IV  40 mg Intravenous QHS   Continuous Infusions:  clevidipine     PRN Meds: acetaminophen **OR** acetaminophen (TYLENOL) oral liquid 160 mg/5 mL **OR** acetaminophen, perflutren lipid microspheres (DEFINITY) IV suspension, senna-docusate  Allergies:    Allergies  Allergen Reactions   Latex Itching   Chlorhexidine Gluconate Hives    Unknown if this is the cause of the hives, raised areas around lab draw sites. No tape and area wiped with chlorhexidine.    Social History:   Social History   Socioeconomic History   Marital status: Single    Spouse name: Not on file   Number of children: Not  on file   Years of education: Not on file   Highest education level: Not on file  Occupational History   Not on file  Tobacco Use   Smoking status: Never   Smokeless tobacco: Never  Substance and Sexual Activity   Alcohol use: Yes    Alcohol/week: 7.0 - 14.0 standard drinks    Types: 7 - 14 Cans of beer per week    Comment: social-daily beers   Drug use: No   Sexual activity: Yes    Birth control/protection: Surgical  Other Topics Concern   Not on file   Social History Narrative   Not on file   Social Determinants of Health   Financial Resource Strain: Not on file  Food Insecurity: Not on file  Transportation Needs: Not on file  Physical Activity: Not on file  Stress: Not on file  Social Connections: Not on file  Intimate Partner Violence: Not on file    Family History:   Family History  Problem Relation Age of Onset   Cerebral aneurysm Mother 56       ruptured   Hypertension Mother        severe   Heart attack Father 48   Coronary artery disease Other    Hypertension Brother        severe   Colon cancer Neg Hx    Breast cancer Neg Hx      ROS:  Please see the history of present illness.  All other ROS reviewed and negative.     Physical Exam/Data:   Vitals:   12/14/21 1200 12/14/21 1300 12/14/21 1400 12/14/21 1500  BP: (!) 143/102 (!) 140/99 (!) 140/103 (!) 135/96  Pulse: 80 97 92 84  Resp: 18 (!) 21 20 16   Temp:      TempSrc:      SpO2: 100% 92% 100% 97%  Weight:      Height:        Intake/Output Summary (Last 24 hours) at 12/14/2021 1554 Last data filed at 12/14/2021 1300 Gross per 24 hour  Intake 1500 ml  Output 1100 ml  Net 400 ml   Last 3 Weights 12/13/2021 12/13/2021 02/07/2021  Weight (lbs) 208 lb 5.4 oz 215 lb 2.7 oz 215 lb 9.6 oz  Weight (kg) 94.5 kg 97.6 kg 97.796 kg     Body mass index is 33.63 kg/m.  General:  Well nourished, well developed, in no acute distress HEENT: normal Neck: no JVD Vascular: No carotid bruits; Distal pulses 2+ bilaterally Cardiac:  normal S1, S2; RRR; no murmur  Lungs:  clear to auscultation bilaterally, no wheezing, rhonchi or rales  Abd: soft, nontender, no hepatomegaly  EHU:DJSHF edema Musculoskeletal:  No deformities, BUE and BLE strength normal and equal Skin: warm and dry  Neuro:  CNs 2-12 intact, no focal abnormalities noted Psych:  Normal affect   EKG:  The EKG was personally reviewed and demonstrates:  Sinus rhythm, HR 94 bpm, LBBB Telemetry:  Telemetry  was personally reviewed and demonstrates:  Sinus rhythm at 80-90s, LBBB  Relevant CV Studies:  Echo 06/2018 Study Conclusions   - Left ventricle: The cavity size was normal. There was mild focal    basal hypertrophy of the septum. Systolic function was normal.    The estimated ejection fraction was in the range of 50% to 55%.    Wall motion was normal; there were no regional wall motion    abnormalities. Findings consistent with abnormal relaxation, but    does not  meet quantitative criteria for diastolic dysfunction.  - Aortic valve: Transvalvular velocity was within the normal range.    There was no stenosis. There was no regurgitation.  - Aorta: Ascending aortic AP diameter: 38 mm.  - Ascending aorta: The ascending aorta was mildly dilated.  - Mitral valve: There was trivial regurgitation.  - Left atrium: The atrium was mildly dilated.  - Right ventricle: Systolic function was normal.  - Atrial septum: No defect or patent foramen ovale was identified.  - Tricuspid valve: There was no significant regurgitation.  - Pulmonic valve: There was no significant regurgitation.   Impressions:   - Normal LV systolic function. Impaired relaxation without criteria    for diastolic dysfunction. Similar to prior study.   Cath 2006: Coronaries:  The left main was normal.  The LAD was large and wrapped the  apex and was normal.  The first diagonal was small and normal.  The second  diagonal was moderate size and normal.  The circumflex and the AV groove was  somewhat small but normal.  There was a very large ramus intermediate which  was normal.  OM1 was small and normal.  The right coronary artery was a  dominant vessel though not particularly large.  It was normal throughout its  course.  There was a PDA which was normal.    Left ventriculogram:  The left ventriculogram was obtained in the RAO  projection.  The EF was about 30-35% with global hypokinesis.    CONCLUSION:  Normal  coronaries.  Moderate left ventricular  dysfunction.  Elevated left ventricular  end diastolic pressure and pulmonary capillary  wedge pressure.    PLAN:  The patient will have medical management of her hypertension  Laboratory Data:  High Sensitivity Troponin:  No results for input(s): TROPONINIHS in the last 720 hours.   Chemistry Recent Labs  Lab 12/13/21 1513 12/13/21 1515 12/14/21 0354  NA 142 142 142  K 4.6 4.7 4.1  CL 113* 115* 115*  CO2 18*  --  19*  GLUCOSE 113* 111* 97  BUN 41* 42* 37*  CREATININE 2.21* 2.30* 1.98*  CALCIUM 9.2  --  8.9  GFRNONAA 25*  --  29*  ANIONGAP 11  --  8    Recent Labs  Lab 12/13/21 1513 12/14/21 0354  PROT 7.8 6.3*  ALBUMIN 4.0 3.3*  AST 17 14*  ALT 13 13  ALKPHOS 113 93  BILITOT 0.6 0.8   Lipids  Recent Labs  Lab 12/14/21 0354  CHOL 172  TRIG 53  HDL 60  LDLCALC 101*  CHOLHDL 2.9    Hematology Recent Labs  Lab 12/13/21 1513 12/13/21 1515 12/14/21 0354  WBC 6.5  --  5.6  RBC 3.85*  --  3.46*  HGB 11.8* 12.6 10.6*  HCT 35.5* 37.0 31.4*  MCV 92.2  --  90.8  MCH 30.6  --  30.6  MCHC 33.2  --  33.8  RDW 14.5  --  14.5  PLT 259  --  230    Radiology/Studies:  ECHOCARDIOGRAM COMPLETE  Result Date: 12/14/2021    ECHOCARDIOGRAM REPORT   Patient Name:   [name] [facility] Date of Exam: 12/14/2021 Medical Rec #:  [id number]   Height:       66.0 in Accession #:    [id number]  Weight:       208.3 lb Date of Birth:  07/30/1961  BSA:          2.035 m Patient Age:  60 years    BP:           124/87 mmHg Patient Gender: F           HR:           83 bpm. Exam Location:  Inpatient Procedure: 2D Echo Indications:    Stroke  History:        Patient has prior history of Echocardiogram examinations, most                 recent 06/09/2018. CHF and Cardiomyopathy; Risk                 Factors:Hypertension.  Sonographer:    Arlyss Gandy Referring Phys: [id number] [place]  1. No left ventricular thrombus is seen, but apex is  suboptimally seen. If embolic stroke is suspected, suggest repeat imaging with Definity contrast. Left ventricular ejection fraction, by estimation, is 20 to 25%. The left ventricle has severely decreased function. The left ventricle demonstrates global hypokinesis. The left ventricular internal cavity size was mildly dilated. Indeterminate diastolic filling due to E-A fusion. Although there is global hypokinesis, the inferior wall and the inferior septum appear disproportionately hypokinetic/nearly akinetic. There is also profound LBBB-related sepatl-lateral dyssynchrony due to LBBB.  2. Right ventricular systolic function is normal. The right ventricular size is normal. There is normal pulmonary artery systolic pressure.  3. Left atrial size was mild to moderately dilated.  4. The mitral valve is normal in structure. Trivial mitral valve regurgitation.  5. The aortic valve is tricuspid. Aortic valve regurgitation is not visualized. No aortic stenosis is present.  6. The inferior vena cava is normal in size with greater than 50% respiratory variability, suggesting right atrial pressure of 3 mmHg. Comparison(s): Prior images reviewed side by side. The left ventricular function is significantly worse. Conclusion(s)/Recommendation(s): No left ventricular thrombus is seen, but if embolic stroke is suspected, suggest repeat imaging with Definity contrast. FINDINGS  Left Ventricle: No left ventricular thrombus is seen, but apex is suboptimally seen. If embolic stroke is suspected, suggest repeat imaging with Definity contrast. Left ventricular ejection fraction, by estimation, is 20 to 25%. The left ventricle has severely decreased function. The left ventricle demonstrates global hypokinesis. The left ventricular internal cavity size was mildly dilated. There is no left ventricular hypertrophy. Abnormal (paradoxical) septal motion, consistent with left bundle branch block. Indeterminate diastolic filling due to E-A  fusion.  LV Wall Scoring: The inferior wall, mid inferoseptal segment, and basal inferoseptal segment are akinetic. The entire anterior wall, entire lateral wall, entire anterior septum, and entire apex are hypokinetic. Although there is global hypokinesis, the inferior wall and the inferior septum appear disproportionately hypokinetic/nearly akinetic. There is also profound LBBB-related sepatl-lateral dyssynchrony due to LBBB. Right Ventricle: The right ventricular size is normal. No increase in right ventricular wall thickness. Right ventricular systolic function is normal. There is normal pulmonary artery systolic pressure. The tricuspid regurgitant velocity is 2.29 m/s, and  with an assumed right atrial pressure of 3 mmHg, the estimated right ventricular systolic pressure is 61.9 mmHg. Left Atrium: Left atrial size was mild to moderately dilated. Right Atrium: Right atrial size was normal in size. Pericardium: There is no evidence of pericardial effusion. Mitral Valve: The mitral valve is normal in structure. Trivial mitral valve regurgitation. Tricuspid Valve: The tricuspid valve is normal in structure. Tricuspid valve regurgitation is trivial. Aortic Valve: The aortic valve is tricuspid. Aortic valve regurgitation is not visualized. No aortic stenosis is present.  Aortic valve mean gradient measures 4.0 mmHg. Aortic valve peak gradient measures 8.1 mmHg. Aortic valve area, by VTI measures 1.71 cm. Pulmonic Valve: The pulmonic valve was normal in structure. Pulmonic valve regurgitation is not visualized. Aorta: The aortic root is normal in size and structure. Venous: The inferior vena cava is normal in size with greater than 50% respiratory variability, suggesting right atrial pressure of 3 mmHg. IAS/Shunts: No atrial level shunt detected by color flow Doppler.  LEFT VENTRICLE PLAX 2D LVIDd:         5.60 cm   Diastology LVIDs:         5.30 cm   LV e' medial:   7.07 cm/s LV PW:         1.00 cm   LV E/e' medial:  18.2 LV IVS:        1.00 cm LVOT diam:     2.00 cm LV SV:         42 LV SV Index:   21 LVOT Area:     3.14 cm  RIGHT VENTRICLE             IVC RV Basal diam:  3.50 cm     IVC diam: 1.60 cm RV Mid diam:    2.70 cm RV S prime:     14.00 cm/s TAPSE (M-mode): 2.0 cm LEFT ATRIUM             Index        RIGHT ATRIUM           Index LA diam:        3.10 cm 1.52 cm/m   RA Area:     14.70 cm LA Vol (A2C):   75.8 ml 37.25 ml/m  RA Volume:   36.20 ml  17.79 ml/m LA Vol (A4C):   64.7 ml 31.80 ml/m LA Biplane Vol: 70.5 ml 34.65 ml/m  AORTIC VALVE AV Area (Vmax):    1.59 cm AV Area (Vmean):   1.49 cm AV Area (VTI):     1.71 cm AV Vmax:           142.00 cm/s AV Vmean:          100.000 cm/s AV VTI:            0.248 m AV Peak Grad:      8.1 mmHg AV Mean Grad:      4.0 mmHg LVOT Vmax:         71.80 cm/s LVOT Vmean:        47.500 cm/s LVOT VTI:          0.135 m LVOT/AV VTI ratio: 0.54  AORTA Ao Root diam: 3.20 cm Ao Asc diam:  3.30 cm MITRAL VALVE                TRICUSPID VALVE MV Area (PHT): 7.09 cm     TR Peak grad:   21.0 mmHg MV Decel Time: 107 msec     TR Vmax:        229.00 cm/s MV E velocity: 129.00 cm/s                             SHUNTS                             Systemic VTI:  0.14 m  Systemic Diam: 2.00 cm Sanda Klein MD Electronically signed by Sanda Klein MD Signature Date/Time: 12/14/2021/12:53:35 PM    Final    CT HEAD CODE STROKE WO CONTRAST  Result Date: 12/13/2021 CLINICAL DATA:  Code stroke.  Neuro deficit, acute, stroke suspected EXAM: CT HEAD WITHOUT CONTRAST TECHNIQUE: Contiguous axial images were obtained from the base of the skull through the vertex without intravenous contrast. RADIATION DOSE REDUCTION: This exam was performed according to the departmental dose-optimization program which includes automated exposure control, adjustment of the mA and/or kV according to patient size and/or use of iterative reconstruction technique. COMPARISON:  None. FINDINGS:  Brain: No evidence of acute large vascular territory infarction, hemorrhage, hydrocephalus, extra-axial collection or mass lesion/mass effect. Small age indeterminate infarct in the left corona radiata. Remote appearing infarct in the left basal ganglia. Additional patchy white matter hypoattenuation, age advanced. Vascular: No hyperdense vessel identified. Skull: No acute fracture. Sinuses/Orbits: Inferior maxillary sinus mucosal thickening with retention cyst versus polyp in the right maxillary sinus. Unremarkable orbits. Other: No mastoid effusions. IMPRESSION: 1. Age indeterminate small lacunar infarct in the left corona radiata. 2. No evidence of acute large vascular territory infarct or acute hemorrhage. 3. Age advanced white matter hypoattenuation, nonspecific but potentially related to chronic microvascular disease or demyelination. MRI with contrast could further evaluate if clinically indicated. Findings discussed with Dr. Leonel Ramsay via telephone at 3:22 p.m. Electronically Signed   By: Margaretha Sheffield M.D.   On: 12/13/2021 15:25     Assessment and Plan:   Acute on chronic combined CHF - Longstanding history of systolic heart failure secondary to hypertensive cardiomyopathy dating back to 2006.  She had normal coronaries by cardiac catheterization at that time.  No ischemic evaluation since then.  With good medical regimen, her EF is improved to 50 to 55%. -Echo this admission showed reduced LV function at 20 to 25% with wall motion abnormality as summarized above.  Patient noted to have left bundle branch block on 02/2021.  Prior EKGs with narrow QRS complex. - There is a pending reading of limited echo with Definity. -Patient will need ischemic evaluation with possible cardiac catheterization when medically stable, likely in outpatient setting.  Thankfully she is not having any anginal symptoms. -Resume home carvedilol, spironolactone and Entresto when able on neurological standpoint.  We  will consider titration of home dose of beta-blocker as well. Would not start amlodipine given reduced LV function. -Patient does eat food high in salt.  Discussed low-sodium diet in detail.  2.  Hypertension -Allowing permissive hypertension -Home antihypertensive on hold -As above  3.  CKD stage III b -Baseline creatinine around 2 -Serum creatinine of 2.21 on admission>> improved to 1.98 today  4. Acute stroke - Per primary team  Risk Assessment/Risk Scores:   New York Heart Association (NYHA) Functional Class NYHA Class II   For questions or updates, please contact [facility] Please consult [email or web address] for contact info under    Jarrett Soho, [place]  12/14/2021 3:54 PM   Attending Note:   The patient was seen and examined.  Agree with assessment and plan as noted above.  Changes made to the above note as needed.  Patient seen and independently examined with Robbie Lis, PA .   We discussed all aspects of the encounter. I agree with the assessment and plan as stated above.     Acute on chronic combined CHF:   pt has developed a LBBB.  Was actually first seen on ECG in April 2022.  She now has an EF of 20-25%.  Was admitted with a stroke. She admits to not being compliant with her diet - eats salt regularly.   Will continue with current meds for now - will allow for permissive HTN for now.   Will await neuro's OK and will then ramp up our CHF meds / diuresis, etc.  She may ultimately need a BI-V pacer .    She will require an ischemic work up at some point .     Creatinine is elevated.   This might limit our options for work up - may make if difficult to do a cath or cor. CTA.   2.  HTN:   will not increase meds yet .   Will wait several days and then increase meds when given the OK by neuro    I have spent a total of 40 minutes with patient reviewing hospital  notes , telemetry, EKGs, labs and examining patient as well as establishing an assessment and  plan that was discussed with the patient.  > 50% of time was spent in direct patient care.     Thayer Headings, Brooke Bonito., MD, [facility] 12/14/2021, 4:16 PM [address],  [place] Pager [phone]

## 2021-12-14 NOTE — Evaluation (Signed)
Physical Therapy Evaluation Patient Details Name: [name] MRN: [id number] DOB: 1961-03-21 Today's Date: 12/14/2021  History of Present Illness  Pt is a 61 y.o. female who presented to the ED on 2/9 secondary to right facial droop, slurred speech and word retrieval difficulty. CT head 2/9: Age indeterminate small lacunar infarct in the left corona radiata. TNK given. Pt found to have COVID-19. PMH: essential hypertension, CHF, obesity with a BMI of 34.73 kg/m, and secondary cardiomyopathy.  Clinical Impression  Patient able to demonstrate mobility close to her baseline.  Limited to in room on respiratory isolation and with heart monitor, but did two laps without assistive device and demonstrated balance activities without difficulty.  PT will sign off.  Noted some decreased coordination/strength R hand and OT to assess/address.         Recommendations for follow up therapy are one component of a multi-disciplinary discharge planning process, led by the attending physician.  Recommendations may be updated based on patient status, additional functional criteria and insurance authorization.  Follow Up Recommendations No PT follow up    Assistance Recommended at Discharge PRN  Patient can return home with the following  Assist for transportation;Help with stairs or ramp for entrance    Equipment Recommendations None recommended by PT  Recommendations for Other Services       Functional Status Assessment Patient has not had a recent decline in their functional status     Precautions / Restrictions Precautions Precautions: None      Mobility  Bed Mobility Overal bed mobility: Modified Independent, Independent                  Transfers Overall transfer level: Independent                      Ambulation/Gait Ambulation/Gait assistance: Supervision Gait Distance (Feet): 80 Feet Assistive device: None Gait Pattern/deviations: Step-through pattern, Antalgic        General Gait Details: mild antalgia on R pt reports due to plantar fasciitis  Stairs            Wheelchair Mobility    Modified Rankin (Stroke Patients Only) Modified Rankin (Stroke Patients Only) Pre-Morbid Rankin Score: No symptoms Modified Rankin: Slight disability     Balance Overall balance assessment: Independent   Sitting balance-Leahy Scale: Normal       Standing balance-Leahy Scale: Normal                 High Level Balance Comments: balance with step taps forward, eyes closed x 10 sec, partial tandem x 30 sec, looking over shoulders and picking up item from floor             Pertinent Vitals/Pain Pain Assessment Pain Assessment: No/denies pain    Home Living Family/patient expects to be discharged to:: Private residence Living Arrangements: Other relatives (nephew) Available Help at Discharge: Friend(s);Family;Available 24 hours/day Type of Home: Apartment Home Access: Stairs to enter Entrance Stairs-Rails: Right Entrance Stairs-Number of Steps: flight   Home Layout: One level Home Equipment: Grab bars - tub/shower      Prior Function Prior Level of Function : Independent/Modified Independent             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       Hand Dominance        Extremity/Trunk Assessment   Upper Extremity Assessment Upper Extremity Assessment: RUE deficits/detail RUE Deficits / Details: decreased grip compared to L and decreased  coordination with touching fingers to thumb    Lower Extremity Assessment Lower Extremity Assessment: Overall WFL for tasks assessed    Cervical / Trunk Assessment Cervical / Trunk Assessment: Normal  Communication   Communication: No difficulties  Cognition Arousal/Alertness: Awake/alert Behavior During Therapy: WFL for tasks assessed/performed Overall Cognitive Status: Within Functional Limits for tasks assessed                                           General Comments General comments (skin integrity, edema, etc.): Reviewed stroke risk factors, pt able to name several and warning signs ("BE FAST")    Exercises     Assessment/Plan    PT Assessment Patient does not need any further PT services  PT Problem List         PT Treatment Interventions      PT Goals (Current goals can be found in the Care Plan section)  Acute Rehab PT Goals Patient Stated Goal: t PT Goal Formulation: All assessment and education complete, DC therapy    Frequency       Co-evaluation               AM-PAC PT "6 Clicks" Mobility  Outcome Measure Help needed turning from your back to your side while in a flat bed without using bedrails?: None Help needed moving from lying on your back to sitting on the side of a flat bed without using bedrails?: None Help needed moving to and from a bed to a chair (including a wheelchair)?: None Help needed standing up from a chair using your arms (e.g., wheelchair or bedside chair)?: None Help needed to walk in hospital room?: None Help needed climbing 3-5 steps with a railing? : Total 6 Click Score: 21    End of Session Equipment Utilized During Treatment: Gait belt Activity Tolerance: Patient tolerated treatment well Patient left: in bed;with call bell/phone within reach;with family/visitor present   PT Visit Diagnosis: Other symptoms and signs involving the nervous system (R29.898)    Time: 8546-2703 PT Time Calculation (min) (ACUTE ONLY): 18 min   Charges:   PT Evaluation $PT Eval Low Complexity: 1 Low          Magda Kiel, PT Acute Rehabilitation Services [phone] Office:[phone] 12/14/2021   [name] 12/14/2021, 2:19 PM

## 2021-12-14 NOTE — Progress Notes (Signed)
Echocardiogram 2D Echocardiogram has been performed.  [name] 12/14/2021, 9:42 AM

## 2021-12-14 NOTE — TOC CAGE-AID Note (Signed)
Transition of Care [facility]) - CAGE-AID Screening   Patient Details  Name: [name] MRN: [id number] Date of Birth: 27-Jan-1961  [facility] CM/SW Contact:    Bladen Umar C Tarpley-Carter, [place] Phone Number: 12/14/2021, 1:19 PM   Clinical Narrative: Pt is unable to participate in Cage Aid. CSW will assess pt at a better time.  Shahir Karen Tarpley-Carter, MSW, LCSW-A Pronouns:  She/Her/Hers [place] Transitions of Care Clinical Social Worker Direct Number:  [phone] [email or web address]  CAGE-AID Screening: Substance Abuse Screening unable to be completed due to: : Patient unable to participate             Substance Abuse Education Offered: Yes  Substance abuse interventions: Scientist, clinical (histocompatibility and immunogenetics)

## 2021-12-14 NOTE — Progress Notes (Signed)
°  Transition of Care [facility]) Screening Note   Patient Details  Name: [name] Date of Birth: Apr 26, 1961   Transition of Care [facility]) CM/SW Contact:    Ella Bodo, RN Phone Number: 12/14/2021, 4:40 PM    Transition of Care Department [facility]) has reviewed patient and no TOC needs have been identified at this time. We will continue to monitor patient advancement through interdisciplinary progression rounds. If new patient transition needs arise, please place a TOC consult.  Reinaldo Raddle, RN, BSN  Trauma/Neuro ICU Case Manager [phone]

## 2021-12-14 NOTE — Progress Notes (Signed)
PT Cancellation Note  Patient Details Name: [name] MRN: [id number] DOB: 01/09/1961   Cancelled Treatment:    Reason Eval/Treat Not Completed: Active bedrest order; will check back if activity orders upgraded.    Reginia Naas 12/14/2021, 9:04 AM Magda Kiel, PT Acute Rehabilitation Services Pager:[phone] [phone] 12/14/2021

## 2021-12-15 LAB — BASIC METABOLIC PANEL
Anion gap: 10 (ref 5–15)
BUN: 37 mg/dL — ABNORMAL HIGH (ref 6–20)
CO2: 18 mmol/L — ABNORMAL LOW (ref 22–32)
Calcium: 9.1 mg/dL (ref 8.9–10.3)
Chloride: 112 mmol/L — ABNORMAL HIGH (ref 98–111)
Creatinine, Ser: 1.96 mg/dL — ABNORMAL HIGH (ref 0.44–1.00)
GFR, Estimated: 29 mL/min — ABNORMAL LOW (ref >60)
Glucose, Bld: 113 mg/dL — ABNORMAL HIGH (ref 70–99)
Potassium: 4.1 mmol/L (ref 3.5–5.1)
Sodium: 140 mmol/L (ref 135–145)

## 2021-12-15 LAB — CBC
HCT: 33.2 % — ABNORMAL LOW (ref 36.0–46.0)
Hemoglobin: 10.7 g/dL — ABNORMAL LOW (ref 12.0–15.0)
MCH: 30.1 pg (ref 26.0–34.0)
MCHC: 32.2 g/dL (ref 30.0–36.0)
MCV: 93.3 fL (ref 80.0–100.0)
Platelets: 233 10*3/uL (ref 150–400)
RBC: 3.56 MIL/uL — ABNORMAL LOW (ref 3.87–5.11)
RDW: 14.4 % (ref 11.5–15.5)
WBC: 10 10*3/uL (ref 4.0–10.5)
nRBC: 0 % (ref 0.0–0.2)

## 2021-12-15 MED ORDER — SACUBITRIL-VALSARTAN 24-26 MG PO TABS
1.0000 | ORAL_TABLET | Freq: Two times a day (BID) | ORAL | Status: DC
  Administered 2021-12-15 – 2021-12-16 (×3): 1 via ORAL
  Filled 2021-12-15 (×3): qty 1

## 2021-12-15 MED ORDER — OXYCODONE HCL 5 MG PO TABS
5.0000 mg | ORAL_TABLET | Freq: Four times a day (QID) | ORAL | Status: DC | PRN
  Administered 2021-12-15 – 2021-12-18 (×7): 5 mg via ORAL
  Filled 2021-12-15 (×7): qty 1

## 2021-12-15 MED ORDER — PANTOPRAZOLE SODIUM 40 MG PO TBEC
40.0000 mg | DELAYED_RELEASE_TABLET | Freq: Every day | ORAL | Status: DC
  Administered 2021-12-15 – 2021-12-17 (×3): 40 mg via ORAL
  Filled 2021-12-15 (×3): qty 1

## 2021-12-15 NOTE — Evaluation (Signed)
Occupational Therapy Evaluation Patient Details Name: [name] MRN: [id number] DOB: December 06, 1960 Today's Date: 12/15/2021   History of Present Illness Pt is a 61 y.o. female who presented to the ED on 2/9 secondary to right facial droop, slurred speech and word retrieval difficulty. CT head 2/9: Age indeterminate small lacunar infarct in the left corona radiata. TNK given. Pt found to have COVID-19. PMH: essential hypertension, CHF, obesity with a BMI of 34.73 kg/m, and secondary cardiomyopathy.   Clinical Impression   [name] was evaluation s/p the above CVA, she is indep at baseline including working. Upon evaluation pt demonstrated indep transfers, mobility and ADLs. She is mildly limited by diminished coordination and dexterity of her R hand, but it is overall WFL. Educated pt to continue to use R hand as functionally as possible and reviewed coordination exercises. Also reviewed BE FAST stroke education, pt verbalized good understanding. Pt will benefit from OT acutely to progress the limitations listed below. Anticipate pt's R limitations resolved acutely without need for follow up care.      Recommendations for follow up therapy are one component of a multi-disciplinary discharge planning process, led by the attending physician.  Recommendations may be updated based on patient status, additional functional criteria and insurance authorization.   Follow Up Recommendations  No OT follow up    Assistance Recommended at Discharge PRN  Patient can return home with the following Assist for transportation    Functional Status Assessment  Patient has had a recent decline in their functional status and demonstrates the ability to make significant improvements in function in a reasonable and predictable amount of time.  Equipment Recommendations  None recommended by OT    Recommendations for Other Services       Precautions / Restrictions Precautions Precautions: None Precaution  Comments: covid+      Mobility Bed Mobility Overal bed mobility: Modified Independent, Independent             General bed mobility comments: R groin pain limiting but overall mod I    Transfers Overall transfer level: Independent                        Balance Overall balance assessment: Independent   Sitting balance-Leahy Scale: Normal       Standing balance-Leahy Scale: Normal                             ADL either performed or assessed with clinical judgement   ADL Overall ADL's : Needs assistance/impaired                                       General ADL Comments: Overall supervision-mod I for all ADLs this date. Pt mildly limited by R coordination during self feeding and grooming     Vision Baseline Vision/History: 0 No visual deficits Vision Assessment?: No apparent visual deficits            Pertinent Vitals/Pain Pain Assessment Pain Assessment: Faces Faces Pain Scale: Hurts little more Pain Location: R groin Pain Descriptors / Indicators: Discomfort, Grimacing Pain Intervention(s): Limited activity within patient's tolerance     Hand Dominance Right   Extremity/Trunk Assessment Upper Extremity Assessment Upper Extremity Assessment: RUE deficits/detail RUE Deficits / Details: 4/5 MMT, ROM is WFL. slow coordination when compared to the R, but overall  WFL. Able to use functionally for ADLs RUE Sensation: WNL RUE Coordination: decreased fine motor   Lower Extremity Assessment Lower Extremity Assessment: Overall WFL for tasks assessed   Cervical / Trunk Assessment Cervical / Trunk Assessment: Normal   Communication Communication Communication: No difficulties   Cognition Arousal/Alertness: Awake/alert Behavior During Therapy: WFL for tasks assessed/performed Overall Cognitive Status: Within Functional Limits for tasks assessed           General Comments: reviewed BE FAST education     General  Comments  VSS on RA     Home Living Family/patient expects to be discharged to:: Private residence Living Arrangements: Other relatives Available Help at Discharge: Friend(s);Family;Available 24 hours/day Type of Home: Apartment Home Access: Stairs to enter Entrance Stairs-Number of Steps: flight Entrance Stairs-Rails: Right Home Layout: One level     Bathroom Shower/Tub: Teacher, early years/pre: Standard     Home Equipment: Grab bars - tub/shower      Lives With: Family    Prior Functioning/Environment Prior Level of Function : Independent/Modified Independent             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 ADLs Comments: indep        OT Problem List: Decreased range of motion;Decreased coordination      OT Treatment/Interventions: Self-care/ADL training;Therapeutic exercise;Therapeutic activities    OT Goals(Current goals can be found in the care plan section) Acute Rehab OT Goals Patient Stated Goal: home OT Goal Formulation: With patient Time For Goal Achievement: 12/29/21 Potential to Achieve Goals: Good ADL Goals Pt/caregiver will Perform Home Exercise Program: Right Upper extremity;With written HEP provided Additional ADL Goal #1: Pt will indep complete all BADLs  OT Frequency: Min 2X/week       AM-PAC OT "6 Clicks" Daily Activity     Outcome Measure Help from another person eating meals?: None Help from another person taking care of personal grooming?: None Help from another person toileting, which includes using toliet, bedpan, or urinal?: None Help from another person bathing (including washing, rinsing, drying)?: None Help from another person to put on and taking off regular upper body clothing?: None Help from another person to put on and taking off regular lower body clothing?: None 6 Click Score: 24   End of Session Nurse Communication: Mobility status  Activity Tolerance: Patient tolerated treatment well Patient  left: in chair;with call bell/phone within reach;with family/visitor present  OT Visit Diagnosis: Hemiplegia and hemiparesis Hemiplegia - Right/Left: Right Hemiplegia - dominant/non-dominant: Dominant Hemiplegia - caused by: Cerebral infarction                Time: 1550-1605 OT Time Calculation (min): 15 min Charges:  OT General Charges $OT Visit: 1 Visit OT Evaluation $OT Eval Low Complexity: 1 Low   Shepherd Finnan A Jenee Spaugh 12/15/2021, 4:15 PM

## 2021-12-15 NOTE — Progress Notes (Signed)
Progress Note  Patient Name: [name] Date of Encounter: 12/15/2021  Primary Cardiologist:   Minus Breeding, MD   Subjective   She has no acute complaints.  She says that she is getting back the strength of her right hand.    Inpatient Medications    Scheduled Meds:  aspirin EC  325 mg Oral Daily   atorvastatin  80 mg Oral Daily   carvedilol  12.5 mg Oral BID WC   Chlorhexidine Gluconate Cloth  6 each Topical Daily   pantoprazole (PROTONIX) IV  40 mg Intravenous QHS   Continuous Infusions:  clevidipine     PRN Meds: acetaminophen **OR** acetaminophen (TYLENOL) oral liquid 160 mg/5 mL **OR** acetaminophen, senna-docusate   Vital Signs    Vitals:   12/14/21 2300 12/14/21 2333 12/15/21 0512 12/15/21 0900  BP:  128/90 (!) 134/98 114/79  Pulse: 93 100 87 90  Resp: 18   18  Temp:  98.8 F (37.1 C) 98.9 F (37.2 C) 99 F (37.2 C)  TempSrc:  Oral Oral Oral  SpO2: 97%  100% 100%  Weight:  93.8 kg    Height:  5\' 6"  (1.676 m)      Intake/Output Summary (Last 24 hours) at 12/15/2021 0956 Last data filed at 12/15/2021 0911 Gross per 24 hour  Intake 1210 ml  Output 2150 ml  Net -940 ml   Filed Weights   12/13/21 1513 12/13/21 1745 12/14/21 2333  Weight: 97.6 kg 94.5 kg 93.8 kg    Telemetry    NSR - Personally Reviewed  ECG    NA - Personally Reviewed  Physical Exam   GEN: No acute distress.   Neck: No  JVD Cardiac: RRR, no murmurs, rubs, or gallops.  Respiratory: Clear  to auscultation bilaterally. GI: Soft, nontender, non-distended  MS: No  edema; No deformity. Neuro:  Nonfocal  Psych: Normal affect   Labs    Chemistry Recent Labs  Lab 12/13/21 1513 12/13/21 1515 12/14/21 0354 12/15/21 0157  NA 142 142 142 140  K 4.6 4.7 4.1 4.1  CL 113* 115* 115* 112*  CO2 18*  --  19* 18*  GLUCOSE 113* 111* 97 113*  BUN 41* 42* 37* 37*  CREATININE 2.21* 2.30* 1.98* 1.96*  CALCIUM 9.2  --  8.9 9.1  PROT 7.8  --  6.3*  --   ALBUMIN 4.0  --  3.3*  --    AST 17  --  14*  --   ALT 13  --  13  --   ALKPHOS 113  --  93  --   BILITOT 0.6  --  0.8  --   GFRNONAA 25*  --  29* 29*  ANIONGAP 11  --  8 10     Hematology Recent Labs  Lab 12/13/21 1513 12/13/21 1515 12/14/21 0354 12/15/21 0157  WBC 6.5  --  5.6 10.0  RBC 3.85*  --  3.46* 3.56*  HGB 11.8* 12.6 10.6* 10.7*  HCT 35.5* 37.0 31.4* 33.2*  MCV 92.2  --  90.8 93.3  MCH 30.6  --  30.6 30.1  MCHC 33.2  --  33.8 32.2  RDW 14.5  --  14.5 14.4  PLT 259  --  230 233    Cardiac EnzymesNo results for input(s): TROPONINI in the last 168 hours. No results for input(s): TROPIPOC in the last 168 hours.   BNPNo results for input(s): BNP, PROBNP in the last 168 hours.   DDimer No results for  input(s): DDIMER in the last 168 hours.   Radiology    MR ANGIO HEAD WO CONTRAST  Result Date: 12/14/2021 CLINICAL DATA:  Neuro deficit, acute, stroke suspected. Right facial droop and aphasia. EXAM: MRI HEAD WITHOUT CONTRAST MRA HEAD WITHOUT CONTRAST MRA NECK WITHOUT CONTRAST TECHNIQUE: Multiplanar, multiecho pulse sequences of the brain and surrounding structures were obtained without intravenous contrast. Angiographic images of the Circle of Willis were obtained using MRA technique without intravenous contrast. Angiographic images of the neck were obtained using MRA technique without intravenous contrast. Carotid stenosis measurements (when applicable) are obtained utilizing NASCET criteria, using the distal internal carotid diameter as the denominator. COMPARISON:  Head CT 12/13/2021 FINDINGS: MRI HEAD FINDINGS Brain: There are small acute cortical infarcts in the left frontoparietal region (MCA territory). There are numerous chronic microhemorrhages throughout both cerebral hemispheres, including prominent involvement of the thalami basal ganglia, and in the cerebellum and brainstem, most likely secondary to chronic hypertension. Patchy T2 hyperintensities in the cerebral white matter and pons are  nonspecific but compatible with moderately age advanced chronic small vessel ischemic disease. Chronic lacunar infarcts are noted in the centrum semiovale/corona radiata bilaterally, basal ganglia, and thalami. The ventricles and sulci are within normal limits for age. No mass, midline shift, or extra-axial fluid collection is identified. Vascular: Major intracranial vascular flow voids are preserved. Skull and upper cervical spine: Unremarkable bone marrow signal. Sinuses/Orbits: Unremarkable orbits. Mild mucosal thickening in the maxillary sinuses. Clear mastoid air cells. Other: None. MRA HEAD FINDINGS The intracranial vertebral arteries are widely patent to the basilar. Patent PICA, AICA, and SCA origins are seen bilaterally. The basilar artery is widely patent with a small fenestration incidentally noted proximally. There are right larger than left posterior communicating arteries. Both PCAs are patent without evidence of a significant proximal stenosis. The internal carotid arteries are widely patent from skull base to carotid termini. There is a 2 mm bulge from the left paraclinoid ICA. A 2 mm bulge at the left posterior communicating artery origin is most compatible with an infundibulum. ACAs and MCAs are patent without evidence of a proximal branch occlusion or significant proximal stenosis. A fenestration is incidentally noted in the anterior communicating region. MRA NECK FINDINGS There is a standard 3 vessel aortic arch. The proximal common carotid arteries are tortuous with associated signal dropout greater on the right which is likely technical. The remainder of the common carotid arteries and included portions of the cervical internal arteries are widely patent without evidence of stenosis or dissection. The vertebral arteries are codominant with antegrade flow bilaterally. Poor signal in the proximal right V1 segment is likely technical, with both vertebral arteries otherwise appearing widely patent  without evidence of a significant stenosis or dissection. IMPRESSION: 1. 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 2. Moderate chronic small vessel ischemic disease with multiple chronic lacunar infarcts. 3. Numerous chronic microhemorrhages likely related to chronic hypertension. 4. No major intracranial arterial occlusion or significant proximal stenosis. 5. 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 6. Widely patent cervical carotid and vertebral arteries. Electronically Signed   By: Logan Bores M.D.   On: 12/14/2021 19:39   MR ANGIO NECK WO CONTRAST  Result Date: 12/14/2021 CLINICAL DATA:  Neuro deficit, acute, stroke suspected. Right facial droop and aphasia. EXAM: MRI HEAD WITHOUT CONTRAST MRA HEAD WITHOUT CONTRAST MRA NECK WITHOUT CONTRAST TECHNIQUE: Multiplanar, multiecho pulse sequences of the brain and surrounding structures were obtained without intravenous contrast. Angiographic images of the  Circle of Willis were obtained using MRA technique without intravenous contrast. Angiographic images of the neck were obtained using MRA technique without intravenous contrast. Carotid stenosis measurements (when applicable) are obtained utilizing NASCET criteria, using the distal internal carotid diameter as the denominator. COMPARISON:  Head CT 12/13/2021 FINDINGS: MRI HEAD FINDINGS Brain: There are small acute cortical infarcts in the left frontoparietal region (MCA territory). There are numerous chronic microhemorrhages throughout both cerebral hemispheres, including prominent involvement of the thalami basal ganglia, and in the cerebellum and brainstem, most likely secondary to chronic hypertension. Patchy T2 hyperintensities in the cerebral white matter and pons are nonspecific but compatible with moderately age advanced chronic small vessel ischemic disease. Chronic lacunar infarcts are noted in the centrum semiovale/corona radiata bilaterally, basal ganglia, and  thalami. The ventricles and sulci are within normal limits for age. No mass, midline shift, or extra-axial fluid collection is identified. Vascular: Major intracranial vascular flow voids are preserved. Skull and upper cervical spine: Unremarkable bone marrow signal. Sinuses/Orbits: Unremarkable orbits. Mild mucosal thickening in the maxillary sinuses. Clear mastoid air cells. Other: None. MRA HEAD FINDINGS The intracranial vertebral arteries are widely patent to the basilar. Patent PICA, AICA, and SCA origins are seen bilaterally. The basilar artery is widely patent with a small fenestration incidentally noted proximally. There are right larger than left posterior communicating arteries. Both PCAs are patent without evidence of a significant proximal stenosis. The internal carotid arteries are widely patent from skull base to carotid termini. There is a 2 mm bulge from the left paraclinoid ICA. A 2 mm bulge at the left posterior communicating artery origin is most compatible with an infundibulum. ACAs and MCAs are patent without evidence of a proximal branch occlusion or significant proximal stenosis. A fenestration is incidentally noted in the anterior communicating region. MRA NECK FINDINGS There is a standard 3 vessel aortic arch. The proximal common carotid arteries are tortuous with associated signal dropout greater on the right which is likely technical. The remainder of the common carotid arteries and included portions of the cervical internal arteries are widely patent without evidence of stenosis or dissection. The vertebral arteries are codominant with antegrade flow bilaterally. Poor signal in the proximal right V1 segment is likely technical, with both vertebral arteries otherwise appearing widely patent without evidence of a significant stenosis or dissection. IMPRESSION: 1. 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 2. Moderate chronic small vessel ischemic disease with multiple  chronic lacunar infarcts. 3. Numerous chronic microhemorrhages likely related to chronic hypertension. 4. No major intracranial arterial occlusion or significant proximal stenosis. 5. 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 6. Widely patent cervical carotid and vertebral arteries. Electronically Signed   By: Logan Bores M.D.   On: 12/14/2021 19:39   MR BRAIN WO CONTRAST  Result Date: 12/14/2021 CLINICAL DATA:  Neuro deficit, acute, stroke suspected. Right facial droop and aphasia. EXAM: MRI HEAD WITHOUT CONTRAST MRA HEAD WITHOUT CONTRAST MRA NECK WITHOUT CONTRAST TECHNIQUE: Multiplanar, multiecho pulse sequences of the brain and surrounding structures were obtained without intravenous contrast. Angiographic images of the Circle of Willis were obtained using MRA technique without intravenous contrast. Angiographic images of the neck were obtained using MRA technique without intravenous contrast. Carotid stenosis measurements (when applicable) are obtained utilizing NASCET criteria, using the distal internal carotid diameter as the denominator. COMPARISON:  Head CT 12/13/2021 FINDINGS: MRI HEAD FINDINGS Brain: There are small acute cortical infarcts in the left frontoparietal region (MCA territory). There are numerous  chronic microhemorrhages throughout both cerebral hemispheres, including prominent involvement of the thalami basal ganglia, and in the cerebellum and brainstem, most likely secondary to chronic hypertension. Patchy T2 hyperintensities in the cerebral white matter and pons are nonspecific but compatible with moderately age advanced chronic small vessel ischemic disease. Chronic lacunar infarcts are noted in the centrum semiovale/corona radiata bilaterally, basal ganglia, and thalami. The ventricles and sulci are within normal limits for age. No mass, midline shift, or extra-axial fluid collection is identified. Vascular: Major intracranial vascular flow voids are preserved.  Skull and upper cervical spine: Unremarkable bone marrow signal. Sinuses/Orbits: Unremarkable orbits. Mild mucosal thickening in the maxillary sinuses. Clear mastoid air cells. Other: None. MRA HEAD FINDINGS The intracranial vertebral arteries are widely patent to the basilar. Patent PICA, AICA, and SCA origins are seen bilaterally. The basilar artery is widely patent with a small fenestration incidentally noted proximally. There are right larger than left posterior communicating arteries. Both PCAs are patent without evidence of a significant proximal stenosis. The internal carotid arteries are widely patent from skull base to carotid termini. There is a 2 mm bulge from the left paraclinoid ICA. A 2 mm bulge at the left posterior communicating artery origin is most compatible with an infundibulum. ACAs and MCAs are patent without evidence of a proximal branch occlusion or significant proximal stenosis. A fenestration is incidentally noted in the anterior communicating region. MRA NECK FINDINGS There is a standard 3 vessel aortic arch. The proximal common carotid arteries are tortuous with associated signal dropout greater on the right which is likely technical. The remainder of the common carotid arteries and included portions of the cervical internal arteries are widely patent without evidence of stenosis or dissection. The vertebral arteries are codominant with antegrade flow bilaterally. Poor signal in the proximal right V1 segment is likely technical, with both vertebral arteries otherwise appearing widely patent without evidence of a significant stenosis or dissection. IMPRESSION: 1. 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 2. Moderate chronic small vessel ischemic disease with multiple chronic lacunar infarcts. 3. Numerous chronic microhemorrhages likely related to chronic hypertension. 4. No major intracranial arterial occlusion or significant proximal stenosis. 5. 2 mm aneurysm versus  infundibulum along the left paraclinoid ICA. 6. Widely patent cervical carotid and vertebral arteries. Electronically Signed   By: Logan Bores M.D.   On: 12/14/2021 19:39   ECHOCARDIOGRAM COMPLETE  Result Date: 12/14/2021    ECHOCARDIOGRAM REPORT   Patient Name:   [name] [facility] Date of Exam: 12/14/2021 Medical Rec #:  [id number]   Height:       66.0 in Accession #:    [id number]  Weight:       208.3 lb Date of Birth:  [birth year]  BSA:          2.035 m Patient Age:    70 years    BP:           124/87 mmHg Patient Gender: F           HR:           83 bpm. Exam Location:  Inpatient Procedure: 2D Echo Indications:    Stroke  History:        Patient has prior history of Echocardiogram examinations, most                 recent 06/09/2018. CHF and Cardiomyopathy; Risk                 Factors:Hypertension.  Sonographer:    Arlyss Gandy Referring Phys: [id number] [place]  1. No left ventricular thrombus is seen, but apex is suboptimally seen. If embolic stroke is suspected, suggest repeat imaging with Definity contrast. Left ventricular ejection fraction, by estimation, is 20 to 25%. The left ventricle has severely decreased function. The left ventricle demonstrates global hypokinesis. The left ventricular internal cavity size was mildly dilated. Indeterminate diastolic filling due to E-A fusion. Although there is global hypokinesis, the inferior wall and the inferior septum appear disproportionately hypokinetic/nearly akinetic. There is also profound LBBB-related sepatl-lateral dyssynchrony due to LBBB.  2. Right ventricular systolic function is normal. The right ventricular size is normal. There is normal pulmonary artery systolic pressure.  3. Left atrial size was mild to moderately dilated.  4. The mitral valve is normal in structure. Trivial mitral valve regurgitation.  5. The aortic valve is tricuspid. Aortic valve regurgitation is not visualized. No aortic stenosis is present.  6. The inferior vena  cava is normal in size with greater than 50% respiratory variability, suggesting right atrial pressure of 3 mmHg. Comparison(s): Prior images reviewed side by side. The left ventricular function is significantly worse. Conclusion(s)/Recommendation(s): No left ventricular thrombus is seen, but if embolic stroke is suspected, suggest repeat imaging with Definity contrast. FINDINGS  Left Ventricle: No left ventricular thrombus is seen, but apex is suboptimally seen. If embolic stroke is suspected, suggest repeat imaging with Definity contrast. Left ventricular ejection fraction, by estimation, is 20 to 25%. The left ventricle has severely decreased function. The left ventricle demonstrates global hypokinesis. The left ventricular internal cavity size was mildly dilated. There is no left ventricular hypertrophy. Abnormal (paradoxical) septal motion, consistent with left bundle branch block. Indeterminate diastolic filling due to E-A fusion.  LV Wall Scoring: The inferior wall, mid inferoseptal segment, and basal inferoseptal segment are akinetic. The entire anterior wall, entire lateral wall, entire anterior septum, and entire apex are hypokinetic. Although there is global hypokinesis, the inferior wall and the inferior septum appear disproportionately hypokinetic/nearly akinetic. There is also profound LBBB-related sepatl-lateral dyssynchrony due to LBBB. Right Ventricle: The right ventricular size is normal. No increase in right ventricular wall thickness. Right ventricular systolic function is normal. There is normal pulmonary artery systolic pressure. The tricuspid regurgitant velocity is 2.29 m/s, and  with an assumed right atrial pressure of 3 mmHg, the estimated right ventricular systolic pressure is 62.7 mmHg. Left Atrium: Left atrial size was mild to moderately dilated. Right Atrium: Right atrial size was normal in size. Pericardium: There is no evidence of pericardial effusion. Mitral Valve: The mitral valve  is normal in structure. Trivial mitral valve regurgitation. Tricuspid Valve: The tricuspid valve is normal in structure. Tricuspid valve regurgitation is trivial. Aortic Valve: The aortic valve is tricuspid. Aortic valve regurgitation is not visualized. No aortic stenosis is present. Aortic valve mean gradient measures 4.0 mmHg. Aortic valve peak gradient measures 8.1 mmHg. Aortic valve area, by VTI measures 1.71 cm. Pulmonic Valve: The pulmonic valve was normal in structure. Pulmonic valve regurgitation is not visualized. Aorta: The aortic root is normal in size and structure. Venous: The inferior vena cava is normal in size with greater than 50% respiratory variability, suggesting right atrial pressure of 3 mmHg. IAS/Shunts: No atrial level shunt detected by color flow Doppler.  LEFT VENTRICLE PLAX 2D LVIDd:         5.60 cm   Diastology LVIDs:         5.30 cm   LV e'  medial:   7.07 cm/s LV PW:         1.00 cm   LV E/e' medial: 18.2 LV IVS:        1.00 cm LVOT diam:     2.00 cm LV SV:         42 LV SV Index:   21 LVOT Area:     3.14 cm  RIGHT VENTRICLE             IVC RV Basal diam:  3.50 cm     IVC diam: 1.60 cm RV Mid diam:    2.70 cm RV S prime:     14.00 cm/s TAPSE (M-mode): 2.0 cm LEFT ATRIUM             Index        RIGHT ATRIUM           Index LA diam:        3.10 cm 1.52 cm/m   RA Area:     14.70 cm LA Vol (A2C):   75.8 ml 37.25 ml/m  RA Volume:   36.20 ml  17.79 ml/m LA Vol (A4C):   64.7 ml 31.80 ml/m LA Biplane Vol: 70.5 ml 34.65 ml/m  AORTIC VALVE AV Area (Vmax):    1.59 cm AV Area (Vmean):   1.49 cm AV Area (VTI):     1.71 cm AV Vmax:           142.00 cm/s AV Vmean:          100.000 cm/s AV VTI:            0.248 m AV Peak Grad:      8.1 mmHg AV Mean Grad:      4.0 mmHg LVOT Vmax:         71.80 cm/s LVOT Vmean:        47.500 cm/s LVOT VTI:          0.135 m LVOT/AV VTI ratio: 0.54  AORTA Ao Root diam: 3.20 cm Ao Asc diam:  3.30 cm MITRAL VALVE                TRICUSPID VALVE MV Area (PHT): 7.09  cm     TR Peak grad:   21.0 mmHg MV Decel Time: 107 msec     TR Vmax:        229.00 cm/s MV E velocity: 129.00 cm/s                             SHUNTS                             Systemic VTI:  0.14 m                             Systemic Diam: 2.00 cm Dani Gobble Croitoru MD Electronically signed by Sanda Klein MD Signature Date/Time: 12/14/2021/12:53:35 PM    Final    CT HEAD CODE STROKE WO CONTRAST  Result Date: 12/13/2021 CLINICAL DATA:  Code stroke.  Neuro deficit, acute, stroke suspected EXAM: CT HEAD WITHOUT CONTRAST TECHNIQUE: Contiguous axial images were obtained from the base of the skull through the vertex without intravenous contrast. RADIATION DOSE REDUCTION: This exam was performed according to the departmental dose-optimization program which includes automated exposure control, adjustment of the mA and/or kV according to patient size  and/or use of iterative reconstruction technique. COMPARISON:  None. FINDINGS: Brain: No evidence of acute large vascular territory infarction, hemorrhage, hydrocephalus, extra-axial collection or mass lesion/mass effect. Small age indeterminate infarct in the left corona radiata. Remote appearing infarct in the left basal ganglia. Additional patchy white matter hypoattenuation, age advanced. Vascular: No hyperdense vessel identified. Skull: No acute fracture. Sinuses/Orbits: Inferior maxillary sinus mucosal thickening with retention cyst versus polyp in the right maxillary sinus. Unremarkable orbits. Other: No mastoid effusions. IMPRESSION: 1. Age indeterminate small lacunar infarct in the left corona radiata. 2. No evidence of acute large vascular territory infarct or acute hemorrhage. 3. Age advanced white matter hypoattenuation, nonspecific but potentially related to chronic microvascular disease or demyelination. MRI with contrast could further evaluate if clinically indicated. Findings discussed with Dr. Leonel Ramsay via telephone at 3:22 p.m. Electronically Signed    By: Margaretha Sheffield M.D.   On: 12/13/2021 15:25   ECHOCARDIOGRAM LIMITED  Result Date: 12/14/2021    ECHOCARDIOGRAM LIMITED REPORT   Patient Name:   [name] [facility] Date of Exam: 12/14/2021 Medical Rec #:  [id number]   Height:       66.0 in Accession #:    [id number]  Weight:       208.3 lb Date of Birth:  17-Mar-1961  BSA:          2.035 m Patient Age:    11 years    BP:           128/93 mmHg Patient Gender: F           HR:           90 bpm. Exam Location:  Inpatient Procedure: 2D Echo, Limited Echo and Intracardiac Opacification Agent Indications:    Stroke  History:        Patient has prior history of Echocardiogram examinations. CHF,                 Signs/Symptoms:Chest Pain and Dyspnea; Risk                 Factors:Hypertension.  Sonographer:    Jyl Heinz Referring Phys: [id number] Rosalin Hawking                  Limited additional images with Definity contrast.                 The apex is now well visualized. There is no evidence of left                 ventricular thrombus.                 Otherwise similar findings to the earlier study.   LV Volumes (MOD) LV vol d, MOD A2C: 196.0 ml LV vol d, MOD A4C: 206.0 ml LV vol s, MOD A2C: 148.0 ml LV vol s, MOD A4C: 159.0 ml LV SV MOD A2C:     48.0 ml LV SV MOD A4C:     206.0 ml LV SV MOD BP:      48.9 ml Dani Gobble Croitoru MD Electronically signed by Sanda Klein MD Signature Date/Time: 12/14/2021/4:14:22 PM    Final     Cardiac Studies   Echocardiogram   12/14/2021  1. No left ventricular thrombus is seen, but apex is suboptimally seen.  If embolic stroke is suspected, suggest repeat imaging with Definity  contrast. Left ventricular ejection fraction, by estimation, is 20 to 25%.  The left ventricle has severely  decreased function. The left ventricle  demonstrates global hypokinesis.  The left ventricular internal cavity size was mildly dilated.  Indeterminate diastolic filling due to E-A fusion. Although there is  global hypokinesis, the inferior wall and the   inferior septum appear disproportionately hypokinetic/nearly akinetic.  There is also profound LBBB-related sepatl-lateral dyssynchrony due to  LBBB.   2. Right ventricular systolic function is normal. The right ventricular  size is normal. There is normal pulmonary artery systolic pressure.   3. Left atrial size was mild to moderately dilated.   4. The mitral valve is normal in structure. Trivial mitral valve  regurgitation.   5. The aortic valve is tricuspid. Aortic valve regurgitation is not  visualized. No aortic stenosis is present.   6. The inferior vena cava is normal in size with greater than 50%  respiratory variability, suggesting right atrial pressure of 3 mmHg.   Note with contrast no evidence of LV thrombus   Patient Profile     61 y.o. female with a hx of nonischemic hypertensive cardiomyopathy improved LV function, morbid obesity, CKD stage III and hypertension who is being seen 12/14/2021 for the evaluation of reduced EF at the request of Janine Ores, NP.  She was admitted for CVA.  EF was noted to be lower than previous.    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  Given the acute decrease we would eventually want a repeat study possibly with CT.  She had an ejection fraction improved with time of her meds.  However, would be reasonable also to get PYP as an outpatient possibly MRI eventually.  I will pursue this.  Previously it was thought that this was likely related to difficult to control hypertension   Hypertension: Blood pressure is mildly elevated.  Beta-blocker has been restarted.  Her creatinine is elevated but has been stable for some time and I think would tolerate the Entresto to be restarted as next med.  I will hold off on the spironolactone and amlodipine for now.    CKD 3B: This has been  followed closely.   Acute CVA: Per primary team.  She is pending further work-up.  She has been treated with DAPT.  COVID-positive: Incidental finding  For questions or updates, please contact [place] Please consult [email or web address] for contact info under Cardiology/STEMI.   Signed, Minus Breeding, MD  12/15/2021, 9:56 AM

## 2021-12-15 NOTE — Progress Notes (Addendum)
STROKE TEAM PROGRESS NOTE   INTERVAL HISTORY Her daughter is at the bedside. Cardiology resuming entresto.  Patient states her right hand weakness is improving but she is not as dexterous in that hand.  Repeat echo with Definity contrast does not show a definite cardiac clot. Vitals:   12/14/21 2200 12/14/21 2300 12/14/21 2333 12/15/21 0512  BP:   128/90 (!) 134/98  Pulse: 94 93 100 87  Resp: (!) 21 18    Temp:   98.8 F (37.1 C) 98.9 F (37.2 C)  TempSrc:   Oral Oral  SpO2: 100% 97%  100%  Weight:   93.8 kg   Height:   5\' 6"  (1.676 m)    CBC:  Recent Labs  Lab 12/13/21 1513 12/13/21 1515 12/14/21 0354 12/15/21 0157  WBC 6.5  --  5.6 10.0  NEUTROABS 3.5  --   --   --   HGB 11.8*   < > 10.6* 10.7*  HCT 35.5*   < > 31.4* 33.2*  MCV 92.2  --  90.8 93.3  PLT 259  --  230 233   < > = values in this interval not displayed.    Basic Metabolic Panel:  Recent Labs  Lab 12/14/21 0354 12/15/21 0157  NA 142 140  K 4.1 4.1  CL 115* 112*  CO2 19* 18*  GLUCOSE 97 113*  BUN 37* 37*  CREATININE 1.98* 1.96*  CALCIUM 8.9 9.1    Lipid Panel:  Recent Labs  Lab 12/14/21 0354  CHOL 172  TRIG 53  HDL 60  CHOLHDL 2.9  VLDL 11  LDLCALC 101*    HgbA1c:  Recent Labs  Lab 12/14/21 0354  HGBA1C 5.7*    Urine Drug Screen: No results for input(s): LABOPIA, COCAINSCRNUR, LABBENZ, AMPHETMU, THCU, LABBARB in the last 168 hours.  Alcohol Level  Recent Labs  Lab 12/13/21 1505  ETH <10     IMAGING past 24 hours MR ANGIO HEAD WO CONTRAST  Result Date: 12/14/2021 CLINICAL DATA:  Neuro deficit, acute, stroke suspected. Right facial droop and aphasia. EXAM: MRI HEAD WITHOUT CONTRAST MRA HEAD WITHOUT CONTRAST MRA NECK WITHOUT CONTRAST TECHNIQUE: Multiplanar, multiecho pulse sequences of the brain and surrounding structures were obtained without intravenous contrast. Angiographic images of the Circle of Willis were obtained using MRA technique without intravenous contrast.  Angiographic images of the neck were obtained using MRA technique without intravenous contrast. Carotid stenosis measurements (when applicable) are obtained utilizing NASCET criteria, using the distal internal carotid diameter as the denominator. COMPARISON:  Head CT 12/13/2021 FINDINGS: MRI HEAD FINDINGS Brain: There are small acute cortical infarcts in the left frontoparietal region (MCA territory). There are numerous chronic microhemorrhages throughout both cerebral hemispheres, including prominent involvement of the thalami basal ganglia, and in the cerebellum and brainstem, most likely secondary to chronic hypertension. Patchy T2 hyperintensities in the cerebral white matter and pons are nonspecific but compatible with moderately age advanced chronic small vessel ischemic disease. Chronic lacunar infarcts are noted in the centrum semiovale/corona radiata bilaterally, basal ganglia, and thalami. The ventricles and sulci are within normal limits for age. No mass, midline shift, or extra-axial fluid collection is identified. Vascular: Major intracranial vascular flow voids are preserved. Skull and upper cervical spine: Unremarkable bone marrow signal. Sinuses/Orbits: Unremarkable orbits. Mild mucosal thickening in the maxillary sinuses. Clear mastoid air cells. Other: None. MRA HEAD FINDINGS The intracranial vertebral arteries are widely patent to the basilar. Patent PICA, AICA, and SCA origins are seen bilaterally. The basilar artery  is widely patent with a small fenestration incidentally noted proximally. There are right larger than left posterior communicating arteries. Both PCAs are patent without evidence of a significant proximal stenosis. The internal carotid arteries are widely patent from skull base to carotid termini. There is a 2 mm bulge from the left paraclinoid ICA. A 2 mm bulge at the left posterior communicating artery origin is most compatible with an infundibulum. ACAs and MCAs are patent without  evidence of a proximal branch occlusion or significant proximal stenosis. A fenestration is incidentally noted in the anterior communicating region. MRA NECK FINDINGS There is a standard 3 vessel aortic arch. The proximal common carotid arteries are tortuous with associated signal dropout greater on the right which is likely technical. The remainder of the common carotid arteries and included portions of the cervical internal arteries are widely patent without evidence of stenosis or dissection. The vertebral arteries are codominant with antegrade flow bilaterally. Poor signal in the proximal right V1 segment is likely technical, with both vertebral arteries otherwise appearing widely patent without evidence of a significant stenosis or dissection. IMPRESSION: 1. 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 2. Moderate chronic small vessel ischemic disease with multiple chronic lacunar infarcts. 3. Numerous chronic microhemorrhages likely related to chronic hypertension. 4. No major intracranial arterial occlusion or significant proximal stenosis. 5. 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 6. Widely patent cervical carotid and vertebral arteries. Electronically Signed   By: Logan Bores M.D.   On: 12/14/2021 19:39   MR ANGIO NECK WO CONTRAST  Result Date: 12/14/2021 CLINICAL DATA:  Neuro deficit, acute, stroke suspected. Right facial droop and aphasia. EXAM: MRI HEAD WITHOUT CONTRAST MRA HEAD WITHOUT CONTRAST MRA NECK WITHOUT CONTRAST TECHNIQUE: Multiplanar, multiecho pulse sequences of the brain and surrounding structures were obtained without intravenous contrast. Angiographic images of the Circle of Willis were obtained using MRA technique without intravenous contrast. Angiographic images of the neck were obtained using MRA technique without intravenous contrast. Carotid stenosis measurements (when applicable) are obtained utilizing NASCET criteria, using the distal  internal carotid diameter as the denominator. COMPARISON:  Head CT 12/13/2021 FINDINGS: MRI HEAD FINDINGS Brain: There are small acute cortical infarcts in the left frontoparietal region (MCA territory). There are numerous chronic microhemorrhages throughout both cerebral hemispheres, including prominent involvement of the thalami basal ganglia, and in the cerebellum and brainstem, most likely secondary to chronic hypertension. Patchy T2 hyperintensities in the cerebral white matter and pons are nonspecific but compatible with moderately age advanced chronic small vessel ischemic disease. Chronic lacunar infarcts are noted in the centrum semiovale/corona radiata bilaterally, basal ganglia, and thalami. The ventricles and sulci are within normal limits for age. No mass, midline shift, or extra-axial fluid collection is identified. Vascular: Major intracranial vascular flow voids are preserved. Skull and upper cervical spine: Unremarkable bone marrow signal. Sinuses/Orbits: Unremarkable orbits. Mild mucosal thickening in the maxillary sinuses. Clear mastoid air cells. Other: None. MRA HEAD FINDINGS The intracranial vertebral arteries are widely patent to the basilar. Patent PICA, AICA, and SCA origins are seen bilaterally. The basilar artery is widely patent with a small fenestration incidentally noted proximally. There are right larger than left posterior communicating arteries. Both PCAs are patent without evidence of a significant proximal stenosis. The internal carotid arteries are widely patent from skull base to carotid termini. There is a 2 mm bulge from the left paraclinoid ICA. A 2 mm bulge at the left posterior communicating artery origin is most compatible  with an infundibulum. ACAs and MCAs are patent without evidence of a proximal branch occlusion or significant proximal stenosis. A fenestration is incidentally noted in the anterior communicating region. MRA NECK FINDINGS There is a standard 3 vessel  aortic arch. The proximal common carotid arteries are tortuous with associated signal dropout greater on the right which is likely technical. The remainder of the common carotid arteries and included portions of the cervical internal arteries are widely patent without evidence of stenosis or dissection. The vertebral arteries are codominant with antegrade flow bilaterally. Poor signal in the proximal right V1 segment is likely technical, with both vertebral arteries otherwise appearing widely patent without evidence of a significant stenosis or dissection. IMPRESSION: 1. 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 2. Moderate chronic small vessel ischemic disease with multiple chronic lacunar infarcts. 3. Numerous chronic microhemorrhages likely related to chronic hypertension. 4. No major intracranial arterial occlusion or significant proximal stenosis. 5. 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 6. Widely patent cervical carotid and vertebral arteries. Electronically Signed   By: Logan Bores M.D.   On: 12/14/2021 19:39   MR BRAIN WO CONTRAST  Result Date: 12/14/2021 CLINICAL DATA:  Neuro deficit, acute, stroke suspected. Right facial droop and aphasia. EXAM: MRI HEAD WITHOUT CONTRAST MRA HEAD WITHOUT CONTRAST MRA NECK WITHOUT CONTRAST TECHNIQUE: Multiplanar, multiecho pulse sequences of the brain and surrounding structures were obtained without intravenous contrast. Angiographic images of the Circle of Willis were obtained using MRA technique without intravenous contrast. Angiographic images of the neck were obtained using MRA technique without intravenous contrast. Carotid stenosis measurements (when applicable) are obtained utilizing NASCET criteria, using the distal internal carotid diameter as the denominator. COMPARISON:  Head CT 12/13/2021 FINDINGS: MRI HEAD FINDINGS Brain: There are small acute cortical infarcts in the left frontoparietal region (MCA territory).  There are numerous chronic microhemorrhages throughout both cerebral hemispheres, including prominent involvement of the thalami basal ganglia, and in the cerebellum and brainstem, most likely secondary to chronic hypertension. Patchy T2 hyperintensities in the cerebral white matter and pons are nonspecific but compatible with moderately age advanced chronic small vessel ischemic disease. Chronic lacunar infarcts are noted in the centrum semiovale/corona radiata bilaterally, basal ganglia, and thalami. The ventricles and sulci are within normal limits for age. No mass, midline shift, or extra-axial fluid collection is identified. Vascular: Major intracranial vascular flow voids are preserved. Skull and upper cervical spine: Unremarkable bone marrow signal. Sinuses/Orbits: Unremarkable orbits. Mild mucosal thickening in the maxillary sinuses. Clear mastoid air cells. Other: None. MRA HEAD FINDINGS The intracranial vertebral arteries are widely patent to the basilar. Patent PICA, AICA, and SCA origins are seen bilaterally. The basilar artery is widely patent with a small fenestration incidentally noted proximally. There are right larger than left posterior communicating arteries. Both PCAs are patent without evidence of a significant proximal stenosis. The internal carotid arteries are widely patent from skull base to carotid termini. There is a 2 mm bulge from the left paraclinoid ICA. A 2 mm bulge at the left posterior communicating artery origin is most compatible with an infundibulum. ACAs and MCAs are patent without evidence of a proximal branch occlusion or significant proximal stenosis. A fenestration is incidentally noted in the anterior communicating region. MRA NECK FINDINGS There is a standard 3 vessel aortic arch. The proximal common carotid arteries are tortuous with associated signal dropout greater on the right which is likely technical. The remainder of the common carotid arteries and included portions  of the cervical internal arteries are widely patent without evidence of stenosis or dissection. The vertebral arteries are codominant with antegrade flow bilaterally. Poor signal in the proximal right V1 segment is likely technical, with both vertebral arteries otherwise appearing widely patent without evidence of a significant stenosis or dissection. IMPRESSION: 1. 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 2. Moderate chronic small vessel ischemic disease with multiple chronic lacunar infarcts. 3. Numerous chronic microhemorrhages likely related to chronic hypertension. 4. No major intracranial arterial occlusion or significant proximal stenosis. 5. 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 6. Widely patent cervical carotid and vertebral arteries. Electronically Signed   By: Logan Bores M.D.   On: 12/14/2021 19:39   ECHOCARDIOGRAM COMPLETE  Result Date: 12/14/2021    ECHOCARDIOGRAM REPORT   Patient Name:   [name] [facility] Date of Exam: 12/14/2021 Medical Rec #:  [id number]   Height:       66.0 in Accession #:    [id number]  Weight:       208.3 lb Date of Birth:  11-21-1960  BSA:          2.035 m Patient Age:    48 years    BP:           124/87 mmHg Patient Gender: F           HR:           83 bpm. Exam Location:  Inpatient Procedure: 2D Echo Indications:    Stroke  History:        Patient has prior history of Echocardiogram examinations, most                 recent 06/09/2018. CHF and Cardiomyopathy; Risk                 Factors:Hypertension.  Sonographer:    Arlyss Gandy Referring Phys: [id number] [place]  1. No left ventricular thrombus is seen, but apex is suboptimally seen. If embolic stroke is suspected, suggest repeat imaging with Definity contrast. Left ventricular ejection fraction, by estimation, is 20 to 25%. The left ventricle has severely decreased function. The left ventricle demonstrates global hypokinesis. The left ventricular internal cavity  size was mildly dilated. Indeterminate diastolic filling due to E-A fusion. Although there is global hypokinesis, the inferior wall and the inferior septum appear disproportionately hypokinetic/nearly akinetic. There is also profound LBBB-related sepatl-lateral dyssynchrony due to LBBB.  2. Right ventricular systolic function is normal. The right ventricular size is normal. There is normal pulmonary artery systolic pressure.  3. Left atrial size was mild to moderately dilated.  4. The mitral valve is normal in structure. Trivial mitral valve regurgitation.  5. The aortic valve is tricuspid. Aortic valve regurgitation is not visualized. No aortic stenosis is present.  6. The inferior vena cava is normal in size with greater than 50% respiratory variability, suggesting right atrial pressure of 3 mmHg. Comparison(s): Prior images reviewed side by side. The left ventricular function is significantly worse. Conclusion(s)/Recommendation(s): No left ventricular thrombus is seen, but if embolic stroke is suspected, suggest repeat imaging with Definity contrast. FINDINGS  Left Ventricle: No left ventricular thrombus is seen, but apex is suboptimally seen. If embolic stroke is suspected, suggest repeat imaging with Definity contrast. Left ventricular ejection fraction, by estimation, is 20 to 25%. The left ventricle has severely decreased function. The left ventricle demonstrates global hypokinesis. The left ventricular internal cavity size was mildly dilated. There is  no left ventricular hypertrophy. Abnormal (paradoxical) septal motion, consistent with left bundle branch block. Indeterminate diastolic filling due to E-A fusion.  LV Wall Scoring: The inferior wall, mid inferoseptal segment, and basal inferoseptal segment are akinetic. The entire anterior wall, entire lateral wall, entire anterior septum, and entire apex are hypokinetic. Although there is global hypokinesis, the inferior wall and the inferior septum appear  disproportionately hypokinetic/nearly akinetic. There is also profound LBBB-related sepatl-lateral dyssynchrony due to LBBB. Right Ventricle: The right ventricular size is normal. No increase in right ventricular wall thickness. Right ventricular systolic function is normal. There is normal pulmonary artery systolic pressure. The tricuspid regurgitant velocity is 2.29 m/s, and  with an assumed right atrial pressure of 3 mmHg, the estimated right ventricular systolic pressure is 68.0 mmHg. Left Atrium: Left atrial size was mild to moderately dilated. Right Atrium: Right atrial size was normal in size. Pericardium: There is no evidence of pericardial effusion. Mitral Valve: The mitral valve is normal in structure. Trivial mitral valve regurgitation. Tricuspid Valve: The tricuspid valve is normal in structure. Tricuspid valve regurgitation is trivial. Aortic Valve: The aortic valve is tricuspid. Aortic valve regurgitation is not visualized. No aortic stenosis is present. Aortic valve mean gradient measures 4.0 mmHg. Aortic valve peak gradient measures 8.1 mmHg. Aortic valve area, by VTI measures 1.71 cm. Pulmonic Valve: The pulmonic valve was normal in structure. Pulmonic valve regurgitation is not visualized. Aorta: The aortic root is normal in size and structure. Venous: The inferior vena cava is normal in size with greater than 50% respiratory variability, suggesting right atrial pressure of 3 mmHg. IAS/Shunts: No atrial level shunt detected by color flow Doppler.  LEFT VENTRICLE PLAX 2D LVIDd:         5.60 cm   Diastology LVIDs:         5.30 cm   LV e' medial:   7.07 cm/s LV PW:         1.00 cm   LV E/e' medial: 18.2 LV IVS:        1.00 cm LVOT diam:     2.00 cm LV SV:         42 LV SV Index:   21 LVOT Area:     3.14 cm  RIGHT VENTRICLE             IVC RV Basal diam:  3.50 cm     IVC diam: 1.60 cm RV Mid diam:    2.70 cm RV S prime:     14.00 cm/s TAPSE (M-mode): 2.0 cm LEFT ATRIUM             Index        RIGHT  ATRIUM           Index LA diam:        3.10 cm 1.52 cm/m   RA Area:     14.70 cm LA Vol (A2C):   75.8 ml 37.25 ml/m  RA Volume:   36.20 ml  17.79 ml/m LA Vol (A4C):   64.7 ml 31.80 ml/m LA Biplane Vol: 70.5 ml 34.65 ml/m  AORTIC VALVE AV Area (Vmax):    1.59 cm AV Area (Vmean):   1.49 cm AV Area (VTI):     1.71 cm AV Vmax:           142.00 cm/s AV Vmean:          100.000 cm/s AV VTI:            0.248 m  AV Peak Grad:      8.1 mmHg AV Mean Grad:      4.0 mmHg LVOT Vmax:         71.80 cm/s LVOT Vmean:        47.500 cm/s LVOT VTI:          0.135 m LVOT/AV VTI ratio: 0.54  AORTA Ao Root diam: 3.20 cm Ao Asc diam:  3.30 cm MITRAL VALVE                TRICUSPID VALVE MV Area (PHT): 7.09 cm     TR Peak grad:   21.0 mmHg MV Decel Time: 107 msec     TR Vmax:        229.00 cm/s MV E velocity: 129.00 cm/s                             SHUNTS                             Systemic VTI:  0.14 m                             Systemic Diam: 2.00 cm Dani Gobble Croitoru MD Electronically signed by Sanda Klein MD Signature Date/Time: 12/14/2021/12:53:35 PM    Final    ECHOCARDIOGRAM LIMITED  Result Date: 12/14/2021    ECHOCARDIOGRAM LIMITED REPORT   Patient Name:   [name] [facility] Date of Exam: 12/14/2021 Medical Rec #:  [id number]   Height:       66.0 in Accession #:    [id number]  Weight:       208.3 lb Date of Birth:  [birth year]  BSA:          2.035 m Patient Age:    89 years    BP:           128/93 mmHg Patient Gender: F           HR:           90 bpm. Exam Location:  Inpatient Procedure: 2D Echo, Limited Echo and Intracardiac Opacification Agent Indications:    Stroke  History:        Patient has prior history of Echocardiogram examinations. CHF,                 Signs/Symptoms:Chest Pain and Dyspnea; Risk                 Factors:Hypertension.  Sonographer:    Jyl Heinz Referring Phys: [id number] Rosalin Hawking                  Limited additional images with Definity contrast.                 The apex is now well visualized. There is  no evidence of left                 ventricular thrombus.                 Otherwise similar findings to the earlier study.   LV Volumes (MOD) LV vol d, MOD A2C: 196.0 ml LV vol d, MOD A4C: 206.0 ml LV vol s, MOD A2C: 148.0 ml LV vol s, MOD A4C: 159.0 ml LV SV MOD A2C:     48.0 ml LV SV MOD A4C:  206.0 ml LV SV MOD BP:      48.9 ml Mihai Croitoru MD Electronically signed by Sanda Klein MD Signature Date/Time: 12/14/2021/4:14:22 PM    Final     PHYSICAL EXAM  Physical Exam  Constitutional: Obese middle-aged African-American lady not in distress Psych: Affect appropriate to situation Eyes: No scleral injection HENT: No OP obstrucion MSK: no joint deformities.  Cardiovascular: Normal rate and regular rhythm.  Respiratory: Effort normal, non-labored breathing GI: Soft.  No distension. There is no tenderness.  Skin: WDI  Neuro: Mental Status: Patient is awake, alert, oriented to person, place, month, year, and situation. Patient is able to give a clear and coherent history. No signs of aphasia or neglect Cranial Nerves: II: Visual Fields are full. Pupils are equal, round, and reactive to light.   III,IV, VI: EOMI without ptosis or diploplia.  V: Facial sensation is symmetric to temperature VII: Mild right facial asymmetry.   VIII: Hearing is intact to voice X: Palate elevates symmetrically XI: Shoulder shrug is symmetric. XII: Tongue protrudes midline without atrophy or fasciculations.  Motor: Tone is normal. Bulk is normal. 5/5 strength was present in all extremities.  Diminished fine finger movements on the right.  Orbits left over right upper extremity. Sensory: Sensation is symmetric to light touch and temperature in the arms and legs. No extinction to DSS present.  Deep Tendon Reflexes: 2+ and symmetric in the biceps and patellae.  Plantars: Toes are downgoing bilaterally.  Cerebellar: FNF and HKS are intact bilaterally  NIH stroke scale 1.  Premorbid modified Rankin score  0  ASSESSMENT/PLAN [name] is a 61 y.o. female with history of essential hypertension, CHF, obesity with a BMI of 34.73 kg/m, and secondary cardiomyopathy presenting with an acute onset of right facial droop and speech disturbance. TNKase was administered on 2/9 1525. Initial NIHss 4 for facial droop and dysarthria.   Stroke: left MCA infarct, embolic pattern, likely due to cardiomyopathy with low EF Code Stroke Age indeterminate small lacunar infarct in the left corona radiata. Age advanced white matter hypoattenuation, nonspecific but potentially related to chronic microvascular disease or demyelination. MRI with contrast could further evaluate if clinically indicated. MRI small acute left frontoparietal MCA branch infarct.  Moderate changes of small vessel disease.  Numerous chronic microhemorrhages. MRA brain and neck no large vessel stenosis or occlusion.   2D Echo 20 to 25%. The left ventricle has severely decreased function. The left ventricle demonstrates global hypokinesis.  2D echo with contrast no definite LV clot LDL 101 HgbA1c 5.7 VTE prophylaxis - SCDs    Diet   Diet Heart Room service appropriate? Yes; Fluid consistency: Thin   No antithrombotic prior to admission, now on Aspirin 325mg  daily  Therapy recommendations: No PT follow-up  disposition: Home CHF History of CHF following with cardiology Home medication including Entresto, Coreg, spironolactone EF 20 to 25% Repeat 2D echo with contrast showed no LV thrombus Gradually resume home meds after 24 hours of permissive hypertension Cardiology on board- resume entresto today  Hypertension Home meds: carvedilol, amlodipine, spironolactone, Entresto Stable Restarting BP meds after 24 hours of permissive hypertension Coreg 12.5mg  resumed Long-term BP goal normotensive  Hyperlipidemia Home meds:  Atorvastatin 40mg ,  Increased to to Atorvastatin 80mg  LDL 101, goal < 70 Continue statin at discharge  Other  Stroke Risk Factors Obesity, Body mass index is 33.38 kg/m., BMI >/= 30 associated with increased stroke risk, recommend weight loss, diet and exercise as appropriate  Hx stroke/TIA Age indeterminate  small lacunar infarct in the left corona radiata Congestive heart failure Spironolactone Keep patient euvolemic  Other Active Problems Cardiomyopathy- Dr. Percival Spanish patient AKI BUN/Creatinine 37/1.98-> 37/1.96 GFR 29 COVID positive Incidentally COVID positive, asymptomatic  Hospital day # 2  Patient seen and examined by NP/APP with MD. MD to update note as needed.   [name] Ores, DNP, FNP-BC [facility] Pager: [phone]  I have personally obtained history,examined this patient, reviewed notes, independently viewed imaging studies, participated in medical decision making and plan of care.ROS completed by me personally and pertinent positives fully documented  I have made any additions or clarifications directly to the above note. Agree with note above.  Patient presented with right hand weakness due to embolic left frontoparietal MCA branch infarct likely of cardioembolic etiology.  2D echo has not shown definite LV clot.  Discussed with cardiology Dr. Percival Spanish will consider doing cardiac CT versus MRI scan on Monday for further delineation of LV wall and clot.  Patient also appears to be at risk for sleep apnea and may benefit with consideration and participation in the sleep smart study.  Will give information to review and decide if she wants to participate.  COVID infection and likely incidental in nature she has no symptoms and needs no definitive treatment related to that.  Greater than 50% time during this 50-minute visit was spent in counseling and coordination of care and discussion with patient and care team and answering questions.  [name] Contras, MD Medical Director [facility] Pager: [phone] 12/15/2021 2:50 PM  To contact Stroke Continuity  provider, please refer to [email or web address] After hours, contact General Neurology

## 2021-12-16 LAB — CBC
HCT: 31.5 % — ABNORMAL LOW (ref 36.0–46.0)
Hemoglobin: 10.5 g/dL — ABNORMAL LOW (ref 12.0–15.0)
MCH: 30.4 pg (ref 26.0–34.0)
MCHC: 33.3 g/dL (ref 30.0–36.0)
MCV: 91.3 fL (ref 80.0–100.0)
Platelets: 211 10*3/uL (ref 150–400)
RBC: 3.45 MIL/uL — ABNORMAL LOW (ref 3.87–5.11)
RDW: 14.6 % (ref 11.5–15.5)
WBC: 10.7 10*3/uL — ABNORMAL HIGH (ref 4.0–10.5)
nRBC: 0 % (ref 0.0–0.2)

## 2021-12-16 LAB — BASIC METABOLIC PANEL
Anion gap: 10 (ref 5–15)
BUN: 42 mg/dL — ABNORMAL HIGH (ref 6–20)
CO2: 19 mmol/L — ABNORMAL LOW (ref 22–32)
Calcium: 8.7 mg/dL — ABNORMAL LOW (ref 8.9–10.3)
Chloride: 111 mmol/L (ref 98–111)
Creatinine, Ser: 2.35 mg/dL — ABNORMAL HIGH (ref 0.44–1.00)
GFR, Estimated: 23 mL/min — ABNORMAL LOW (ref >60)
Glucose, Bld: 116 mg/dL — ABNORMAL HIGH (ref 70–99)
Potassium: 4.2 mmol/L (ref 3.5–5.1)
Sodium: 140 mmol/L (ref 135–145)

## 2021-12-16 MED ORDER — HYDRALAZINE HCL 10 MG PO TABS
10.0000 mg | ORAL_TABLET | Freq: Three times a day (TID) | ORAL | Status: DC
  Administered 2021-12-16 – 2021-12-18 (×7): 10 mg via ORAL
  Filled 2021-12-16 (×7): qty 1

## 2021-12-16 MED ORDER — ISOSORBIDE MONONITRATE ER 30 MG PO TB24
30.0000 mg | ORAL_TABLET | Freq: Every day | ORAL | Status: DC
  Administered 2021-12-16 – 2021-12-18 (×3): 30 mg via ORAL
  Filled 2021-12-16 (×3): qty 1

## 2021-12-16 NOTE — Progress Notes (Signed)
Progress Note  Patient Name: [name] Date of Encounter: 12/16/2021  Primary Cardiologist:   Minus Breeding, MD   Subjective   She is having some sharp right groin pain.    Inpatient Medications    Scheduled Meds:  aspirin EC  325 mg Oral Daily   atorvastatin  80 mg Oral Daily   carvedilol  12.5 mg Oral BID WC   Chlorhexidine Gluconate Cloth  6 each Topical Daily   pantoprazole  40 mg Oral QHS   sacubitril-valsartan  1 tablet Oral BID   Continuous Infusions:  clevidipine     PRN Meds: acetaminophen **OR** acetaminophen (TYLENOL) oral liquid 160 mg/5 mL **OR** acetaminophen, oxyCODONE, senna-docusate   Vital Signs    Vitals:   12/15/21 1900 12/15/21 2338 12/16/21 0347 12/16/21 0800  BP: 113/84 128/83 128/83 (!) 121/93  Pulse:  88 84 94  Resp:  14 13 18   Temp: 98.6 F (37 C) 98.6 F (37 C) 98.6 F (37 C) 98.5 F (36.9 C)  TempSrc: Oral Oral Oral Oral  SpO2: 100% 100% 100% 100%  Weight:      Height:       No intake or output data in the 24 hours ending 12/16/21 1137  Filed Weights   12/13/21 1513 12/13/21 1745 12/14/21 2333  Weight: 97.6 kg 94.5 kg 93.8 kg    Telemetry    NSR, NSVT - Personally Reviewed  ECG    NA - Personally Reviewed  Physical Exam   GEN: No  acute distress.   Neck: No  JVD Cardiac: RRR, no murmurs, rubs, or gallops.  Respiratory: Clear   to auscultation bilaterally. GI: Soft, nontender, non-distended, normal bowel sounds  MS:   No edema; No deformity. Neuro:   Nonfocal  Psych: Oriented and appropriate    Labs    Chemistry Recent Labs  Lab 12/13/21 1513 12/13/21 1515 12/14/21 0354 12/15/21 0157 12/16/21 0213  NA 142   < > 142 140 140  K 4.6   < > 4.1 4.1 4.2  CL 113*   < > 115* 112* 111  CO2 18*  --  19* 18* 19*  GLUCOSE 113*   < > 97 113* 116*  BUN 41*   < > 37* 37* 42*  CREATININE 2.21*   < > 1.98* 1.96* 2.35*  CALCIUM 9.2  --  8.9 9.1 8.7*  PROT 7.8  --  6.3*  --   --   ALBUMIN 4.0  --  3.3*  --   --    AST 17  --  14*  --   --   ALT 13  --  13  --   --   ALKPHOS 113  --  93  --   --   BILITOT 0.6  --  0.8  --   --   GFRNONAA 25*  --  29* 29* 23*  ANIONGAP 11  --  8 10 10    < > = values in this interval not displayed.     Hematology Recent Labs  Lab 12/14/21 0354 12/15/21 0157 12/16/21 0213  WBC 5.6 10.0 10.7*  RBC 3.46* 3.56* 3.45*  HGB 10.6* 10.7* 10.5*  HCT 31.4* 33.2* 31.5*  MCV 90.8 93.3 91.3  MCH 30.6 30.1 30.4  MCHC 33.8 32.2 33.3  RDW 14.5 14.4 14.6  PLT 230 233 211    Cardiac EnzymesNo results for input(s): TROPONINI in the last 168 hours. No results for input(s): TROPIPOC in the last 168 hours.  BNPNo results for input(s): BNP, PROBNP in the last 168 hours.   DDimer No results for input(s): DDIMER in the last 168 hours.   Radiology    MR ANGIO HEAD WO CONTRAST  Result Date: 12/14/2021 CLINICAL DATA:  Neuro deficit, acute, stroke suspected. Right facial droop and aphasia. EXAM: MRI HEAD WITHOUT CONTRAST MRA HEAD WITHOUT CONTRAST MRA NECK WITHOUT CONTRAST TECHNIQUE: Multiplanar, multiecho pulse sequences of the brain and surrounding structures were obtained without intravenous contrast. Angiographic images of the Circle of Willis were obtained using MRA technique without intravenous contrast. Angiographic images of the neck were obtained using MRA technique without intravenous contrast. Carotid stenosis measurements (when applicable) are obtained utilizing NASCET criteria, using the distal internal carotid diameter as the denominator. COMPARISON:  Head CT 12/13/2021 FINDINGS: MRI HEAD FINDINGS Brain: There are small acute cortical infarcts in the left frontoparietal region (MCA territory). There are numerous chronic microhemorrhages throughout both cerebral hemispheres, including prominent involvement of the thalami basal ganglia, and in the cerebellum and brainstem, most likely secondary to chronic hypertension. Patchy T2 hyperintensities in the cerebral white matter  and pons are nonspecific but compatible with moderately age advanced chronic small vessel ischemic disease. Chronic lacunar infarcts are noted in the centrum semiovale/corona radiata bilaterally, basal ganglia, and thalami. The ventricles and sulci are within normal limits for age. No mass, midline shift, or extra-axial fluid collection is identified. Vascular: Major intracranial vascular flow voids are preserved. Skull and upper cervical spine: Unremarkable bone marrow signal. Sinuses/Orbits: Unremarkable orbits. Mild mucosal thickening in the maxillary sinuses. Clear mastoid air cells. Other: None. MRA HEAD FINDINGS The intracranial vertebral arteries are widely patent to the basilar. Patent PICA, AICA, and SCA origins are seen bilaterally. The basilar artery is widely patent with a small fenestration incidentally noted proximally. There are right larger than left posterior communicating arteries. Both PCAs are patent without evidence of a significant proximal stenosis. The internal carotid arteries are widely patent from skull base to carotid termini. There is a 2 mm bulge from the left paraclinoid ICA. A 2 mm bulge at the left posterior communicating artery origin is most compatible with an infundibulum. ACAs and MCAs are patent without evidence of a proximal branch occlusion or significant proximal stenosis. A fenestration is incidentally noted in the anterior communicating region. MRA NECK FINDINGS There is a standard 3 vessel aortic arch. The proximal common carotid arteries are tortuous with associated signal dropout greater on the right which is likely technical. The remainder of the common carotid arteries and included portions of the cervical internal arteries are widely patent without evidence of stenosis or dissection. The vertebral arteries are codominant with antegrade flow bilaterally. Poor signal in the proximal right V1 segment is likely technical, with both vertebral arteries otherwise appearing  widely patent without evidence of a significant stenosis or dissection. IMPRESSION: 1. 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 2. Moderate chronic small vessel ischemic disease with multiple chronic lacunar infarcts. 3. Numerous chronic microhemorrhages likely related to chronic hypertension. 4. No major intracranial arterial occlusion or significant proximal stenosis. 5. 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 6. Widely patent cervical carotid and vertebral arteries. Electronically Signed   By: Logan Bores M.D.   On: 12/14/2021 19:39   MR ANGIO NECK WO CONTRAST  Result Date: 12/14/2021 CLINICAL DATA:  Neuro deficit, acute, stroke suspected. Right facial droop and aphasia. EXAM: MRI HEAD WITHOUT CONTRAST MRA HEAD WITHOUT CONTRAST MRA NECK WITHOUT CONTRAST TECHNIQUE: Multiplanar, multiecho  pulse sequences of the brain and surrounding structures were obtained without intravenous contrast. Angiographic images of the Circle of Willis were obtained using MRA technique without intravenous contrast. Angiographic images of the neck were obtained using MRA technique without intravenous contrast. Carotid stenosis measurements (when applicable) are obtained utilizing NASCET criteria, using the distal internal carotid diameter as the denominator. COMPARISON:  Head CT 12/13/2021 FINDINGS: MRI HEAD FINDINGS Brain: There are small acute cortical infarcts in the left frontoparietal region (MCA territory). There are numerous chronic microhemorrhages throughout both cerebral hemispheres, including prominent involvement of the thalami basal ganglia, and in the cerebellum and brainstem, most likely secondary to chronic hypertension. Patchy T2 hyperintensities in the cerebral white matter and pons are nonspecific but compatible with moderately age advanced chronic small vessel ischemic disease. Chronic lacunar infarcts are noted in the centrum semiovale/corona radiata bilaterally, basal  ganglia, and thalami. The ventricles and sulci are within normal limits for age. No mass, midline shift, or extra-axial fluid collection is identified. Vascular: Major intracranial vascular flow voids are preserved. Skull and upper cervical spine: Unremarkable bone marrow signal. Sinuses/Orbits: Unremarkable orbits. Mild mucosal thickening in the maxillary sinuses. Clear mastoid air cells. Other: None. MRA HEAD FINDINGS The intracranial vertebral arteries are widely patent to the basilar. Patent PICA, AICA, and SCA origins are seen bilaterally. The basilar artery is widely patent with a small fenestration incidentally noted proximally. There are right larger than left posterior communicating arteries. Both PCAs are patent without evidence of a significant proximal stenosis. The internal carotid arteries are widely patent from skull base to carotid termini. There is a 2 mm bulge from the left paraclinoid ICA. A 2 mm bulge at the left posterior communicating artery origin is most compatible with an infundibulum. ACAs and MCAs are patent without evidence of a proximal branch occlusion or significant proximal stenosis. A fenestration is incidentally noted in the anterior communicating region. MRA NECK FINDINGS There is a standard 3 vessel aortic arch. The proximal common carotid arteries are tortuous with associated signal dropout greater on the right which is likely technical. The remainder of the common carotid arteries and included portions of the cervical internal arteries are widely patent without evidence of stenosis or dissection. The vertebral arteries are codominant with antegrade flow bilaterally. Poor signal in the proximal right V1 segment is likely technical, with both vertebral arteries otherwise appearing widely patent without evidence of a significant stenosis or dissection. IMPRESSION: 1. 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 2. Moderate chronic small vessel ischemic disease with  multiple chronic lacunar infarcts. 3. Numerous chronic microhemorrhages likely related to chronic hypertension. 4. No major intracranial arterial occlusion or significant proximal stenosis. 5. 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 6. Widely patent cervical carotid and vertebral arteries. Electronically Signed   By: Logan Bores M.D.   On: 12/14/2021 19:39   MR BRAIN WO CONTRAST  Result Date: 12/14/2021 CLINICAL DATA:  Neuro deficit, acute, stroke suspected. Right facial droop and aphasia. EXAM: MRI HEAD WITHOUT CONTRAST MRA HEAD WITHOUT CONTRAST MRA NECK WITHOUT CONTRAST TECHNIQUE: Multiplanar, multiecho pulse sequences of the brain and surrounding structures were obtained without intravenous contrast. Angiographic images of the Circle of Willis were obtained using MRA technique without intravenous contrast. Angiographic images of the neck were obtained using MRA technique without intravenous contrast. Carotid stenosis measurements (when applicable) are obtained utilizing NASCET criteria, using the distal internal carotid diameter as the denominator. COMPARISON:  Head CT 12/13/2021 FINDINGS: MRI HEAD FINDINGS  Brain: There are small acute cortical infarcts in the left frontoparietal region (MCA territory). There are numerous chronic microhemorrhages throughout both cerebral hemispheres, including prominent involvement of the thalami basal ganglia, and in the cerebellum and brainstem, most likely secondary to chronic hypertension. Patchy T2 hyperintensities in the cerebral white matter and pons are nonspecific but compatible with moderately age advanced chronic small vessel ischemic disease. Chronic lacunar infarcts are noted in the centrum semiovale/corona radiata bilaterally, basal ganglia, and thalami. The ventricles and sulci are within normal limits for age. No mass, midline shift, or extra-axial fluid collection is identified. Vascular: Major intracranial vascular flow voids are  preserved. Skull and upper cervical spine: Unremarkable bone marrow signal. Sinuses/Orbits: Unremarkable orbits. Mild mucosal thickening in the maxillary sinuses. Clear mastoid air cells. Other: None. MRA HEAD FINDINGS The intracranial vertebral arteries are widely patent to the basilar. Patent PICA, AICA, and SCA origins are seen bilaterally. The basilar artery is widely patent with a small fenestration incidentally noted proximally. There are right larger than left posterior communicating arteries. Both PCAs are patent without evidence of a significant proximal stenosis. The internal carotid arteries are widely patent from skull base to carotid termini. There is a 2 mm bulge from the left paraclinoid ICA. A 2 mm bulge at the left posterior communicating artery origin is most compatible with an infundibulum. ACAs and MCAs are patent without evidence of a proximal branch occlusion or significant proximal stenosis. A fenestration is incidentally noted in the anterior communicating region. MRA NECK FINDINGS There is a standard 3 vessel aortic arch. The proximal common carotid arteries are tortuous with associated signal dropout greater on the right which is likely technical. The remainder of the common carotid arteries and included portions of the cervical internal arteries are widely patent without evidence of stenosis or dissection. The vertebral arteries are codominant with antegrade flow bilaterally. Poor signal in the proximal right V1 segment is likely technical, with both vertebral arteries otherwise appearing widely patent without evidence of a significant stenosis or dissection. IMPRESSION: 1. 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 2. Moderate chronic small vessel ischemic disease with multiple chronic lacunar infarcts. 3. Numerous chronic microhemorrhages likely related to chronic hypertension. 4. No major intracranial arterial occlusion or significant proximal stenosis. 5. 2 mm aneurysm  versus infundibulum along the left paraclinoid ICA. 6. Widely patent cervical carotid and vertebral arteries. Electronically Signed   By: Logan Bores M.D.   On: 12/14/2021 19:39   ECHOCARDIOGRAM LIMITED  Result Date: 12/14/2021    ECHOCARDIOGRAM LIMITED REPORT   Patient Name:   [name] [facility] Date of Exam: 12/14/2021 Medical Rec #:  [id number]   Height:       66.0 in Accession #:    [id number]  Weight:       208.3 lb Date of Birth:  1960/11/25  BSA:          2.035 m Patient Age:    26 years    BP:           128/93 mmHg Patient Gender: F           HR:           90 bpm. Exam Location:  Inpatient Procedure: 2D Echo, Limited Echo and Intracardiac Opacification Agent Indications:    Stroke  History:        Patient has prior history of Echocardiogram examinations. CHF,  Signs/Symptoms:Chest Pain and Dyspnea; Risk                 Factors:Hypertension.  Sonographer:    Jyl Heinz Referring Phys: [id number] Rosalin Hawking                  Limited additional images with Definity contrast.                 The apex is now well visualized. There is no evidence of left                 ventricular thrombus.                 Otherwise similar findings to the earlier study.   LV Volumes (MOD) LV vol d, MOD A2C: 196.0 ml LV vol d, MOD A4C: 206.0 ml LV vol s, MOD A2C: 148.0 ml LV vol s, MOD A4C: 159.0 ml LV SV MOD A2C:     48.0 ml LV SV MOD A4C:     206.0 ml LV SV MOD BP:      48.9 ml Dani Gobble Croitoru MD Electronically signed by Sanda Klein MD Signature Date/Time: 12/14/2021/4:14:22 PM    Final     Cardiac Studies   Echocardiogram   12/14/2021  1. No left ventricular thrombus is seen, but apex is suboptimally seen.  If embolic stroke is suspected, suggest repeat imaging with Definity  contrast. Left ventricular ejection fraction, by estimation, is 20 to 25%.  The left ventricle has severely  decreased function. The left ventricle demonstrates global hypokinesis.  The left ventricular internal cavity size was mildly  dilated.  Indeterminate diastolic filling due to E-A fusion. Although there is  global hypokinesis, the inferior wall and the  inferior septum appear disproportionately hypokinetic/nearly akinetic.  There is also profound LBBB-related sepatl-lateral dyssynchrony due to  LBBB.   2. Right ventricular systolic function is normal. The right ventricular  size is normal. There is normal pulmonary artery systolic pressure.   3. Left atrial size was mild to moderately dilated.   4. The mitral valve is normal in structure. Trivial mitral valve  regurgitation.   5. The aortic valve is tricuspid. Aortic valve regurgitation is not  visualized. No aortic stenosis is present.   6. The inferior vena cava is normal in size with greater than 50%  respiratory variability, suggesting right atrial pressure of 3 mmHg.   Note with contrast no evidence of LV thrombus   Patient Profile     61 y.o. female with a hx of nonischemic hypertensive cardiomyopathy improved LV function, morbid obesity, CKD stage III and hypertension who is being seen 12/14/2021 for the evaluation of reduced EF at the request of Janine Ores, NP.  She was admitted for CVA.  EF was noted to be lower than previous.    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  Plan to increase the beta blocker and then use nitrates/hydral  Hypertension: Blood pressure is going to be managed based on the above.   CKD 3B:    Creat jumped up.   Stop Entresto.     Acute CVA: Per primary team.   I spoke with Dr. Leonie Man.    I will try to get an MRI to look for intracardiac thrombus as well as assess for infiltrative etiology to reduced EF.    COVID-positive:  Incidental finding  For questions or updates, please contact [place] Please consult [email or web address] for contact info under Cardiology/STEMI.   Signed, Minus Breeding, MD   12/16/2021, 11:37 AM

## 2021-12-16 NOTE — Progress Notes (Addendum)
STROKE TEAM PROGRESS NOTE   INTERVAL HISTORY Her family is at the bedside. Cardiology resuming entresto and plan cardiac MRI from Monday.  Patient is willing to participate in the sleep smart stroke study and will undergo the NOx 3 monitor tonight for screening Cardiac imaging tomorrow per cardiology.  Vitals:   12/15/21 1900 12/15/21 2338 12/16/21 0347 12/16/21 0800  BP: 113/84 128/83 128/83 (!) 121/93  Pulse:  88 84 94  Resp:  14 13 18   Temp: 98.6 F (37 C) 98.6 F (37 C) 98.6 F (37 C) 98.5 F (36.9 C)  TempSrc: Oral Oral Oral Oral  SpO2: 100% 100% 100% 100%  Weight:      Height:       CBC:  Recent Labs  Lab 12/13/21 1513 12/13/21 1515 12/15/21 0157 12/16/21 0213  WBC 6.5   < > 10.0 10.7*  NEUTROABS 3.5  --   --   --   HGB 11.8*   < > 10.7* 10.5*  HCT 35.5*   < > 33.2* 31.5*  MCV 92.2   < > 93.3 91.3  PLT 259   < > 233 211   < > = values in this interval not displayed.    Basic Metabolic Panel:  Recent Labs  Lab 12/15/21 0157 12/16/21 0213  NA 140 140  K 4.1 4.2  CL 112* 111  CO2 18* 19*  GLUCOSE 113* 116*  BUN 37* 42*  CREATININE 1.96* 2.35*  CALCIUM 9.1 8.7*    Lipid Panel:  Recent Labs  Lab 12/14/21 0354  CHOL 172  TRIG 53  HDL 60  CHOLHDL 2.9  VLDL 11  LDLCALC 101*    HgbA1c:  Recent Labs  Lab 12/14/21 0354  HGBA1C 5.7*    Urine Drug Screen: No results for input(s): LABOPIA, COCAINSCRNUR, LABBENZ, AMPHETMU, THCU, LABBARB in the last 168 hours.  Alcohol Level  Recent Labs  Lab 12/13/21 1505  ETH <10     IMAGING past 24 hours No results found.  PHYSICAL EXAM  Physical Exam  Constitutional: Obese middle-aged African-American lady not in distress Psych: Affect appropriate to situation Cardiovascular: Normal rate and regular rhythm.  Respiratory: Effort normal, non-labored breathing  Neuro: Mental Status: Patient is awake, alert, oriented to person, place, month, year, and situation. Patient is able to give a clear and  coherent history. No signs of aphasia or neglect Cranial Nerves: II: Visual Fields are full. Pupils are equal, round, and reactive to light.   III,IV, VI: EOMI without ptosis or diploplia.  V: Facial sensation is symmetric to temperature VII: Mild right facial asymmetry.   VIII: Hearing is intact to voice X: Palate elevates symmetrically XI: Shoulder shrug is symmetric. XII: Tongue protrudes midline without atrophy or fasciculations.  Motor: Tone is normal. Bulk is normal. 5/5 strength was present in all extremities.  Diminished fine finger movements on the right.  Orbits left over right upper extremity. Sensory: Sensation is symmetric to light touch and temperature in the arms and legs. No extinction to DSS present.  Deep Tendon Reflexes: 2+ and symmetric in the biceps and patellae.  Plantars: Toes are downgoing bilaterally.  Cerebellar: FNF and HKS are intact bilaterally  NIH stroke scale 1.  Premorbid modified Rankin score 0  ASSESSMENT/PLAN Ms. EMMRY HINSCH is a 61 y.o. female with history of essential hypertension, CHF, obesity with a BMI of 34.73 kg/m, and secondary cardiomyopathy presenting with an acute onset of right facial droop and speech disturbance. TNKase was administered on 2/9  1525. Initial NIHss 4 for facial droop and dysarthria.   Stroke: left MCA infarct, embolic pattern, likely due to cardiomyopathy with low EF Code Stroke Age indeterminate small lacunar infarct in the left corona radiata. Age advanced white matter hypoattenuation, nonspecific but potentially related to chronic microvascular disease or demyelination. MRI with contrast could further evaluate if clinically indicated. MRI small acute left frontoparietal MCA branch infarct.  Moderate changes of small vessel disease.  Numerous chronic microhemorrhages. MRA brain and neck no large vessel stenosis or occlusion.   2D Echo 20 to 25%. The left ventricle has severely decreased function. The left ventricle  demonstrates global hypokinesis.  2D echo with contrast no definite LV clot LDL 101 HgbA1c 5.7 VTE prophylaxis - SCDs No antithrombotic prior to admission, now on Aspirin 325mg  daily  Therapy recommendations: No PT follow-up  disposition: Home CHF History of CHF following with cardiology Home medication including Entresto, Coreg, spironolactone EF 20 to 25% Repeat 2D echo with contrast showed no LV thrombus Gradually resume home meds after 24 hours of permissive hypertension Entresto now on hold r/t Cr, increased BB - cardiology  Cardiac MRI tomorrow MRI to look for intracardiac thrombus as well as assess for infiltrative etiology to reduced EF.  Hypertension Home meds: carvedilol, amlodipine, spironolactone, Entresto Stable Restarting BP meds after 24 hours of permissive hypertension Coreg 12.5mg  resumed Long-term BP goal normotensive  Hyperlipidemia Home meds:  Atorvastatin 40mg ,  Increased to to Atorvastatin 80mg  LDL 101, goal < 70 Continue statin at discharge  Other Stroke Risk Factors Obesity, Body mass index is 33.38 kg/m., BMI >/= 30 associated with increased stroke risk, recommend weight loss, diet and exercise as appropriate  Hx stroke/TIA Age indeterminate small lacunar infarct in the left corona radiata Congestive heart failure Spironolactone Keep patient euvolemic  Other Active Problems Cardiomyopathy- Dr. Percival Spanish patient AKI BUN/Creatinine 37/1.98-> 37/1.96 -> 42/2.35 GFR 29 Entresto d/c'd COVID positive Incidentally COVID positive, asymptomatic  Hospital day # 3  Patient seen and examined by NP/APP with MD. MD to update note as needed.   Janine Ores, DNP, FNP-BC [facility] Pager: [phone] I have personally obtained history,examined this patient, reviewed notes, independently viewed imaging studies, participated in medical decision making and plan of care.ROS completed by me personally and pertinent positives fully documented   I have made any additions or clarifications directly to the above note. Agree with note above.  Patient is doing well but cardiology plan on scheduling MRI scan of the heart for tomorrow to look for infiltrative cardiomyopathy versus left atrial clot.  Patient is agreeable to participate in the sleep smart study for stroke prevention and will undergo NOx 3 monitor for screening tonight.  Long discussion with patient and multiple family members at the bedside and answered questions.  Greater than 50% time during this 35-minute visit was spent on counseling and coordination of care about stroke and cardiomyopathy and sleep apnea and answering questions  Antony Contras, MD Medical Director [place] Pager: [phone] 12/16/2021 2:58 PM   To contact Stroke Continuity provider, please refer to [email or web address] After hours, contact General Neurology

## 2021-12-17 ENCOUNTER — Encounter (HOSPITAL_COMMUNITY): Payer: Self-pay | Admitting: Neurology

## 2021-12-17 ENCOUNTER — Encounter (HOSPITAL_COMMUNITY): Payer: Managed Care, Other (non HMO)

## 2021-12-17 ENCOUNTER — Inpatient Hospital Stay (HOSPITAL_COMMUNITY): Payer: Managed Care, Other (non HMO)

## 2021-12-17 DIAGNOSIS — I6389 Other cerebral infarction: Secondary | ICD-10-CM

## 2021-12-17 LAB — CBC
HCT: 31.5 % — ABNORMAL LOW (ref 36.0–46.0)
Hemoglobin: 10.4 g/dL — ABNORMAL LOW (ref 12.0–15.0)
MCH: 30.3 pg (ref 26.0–34.0)
MCHC: 33 g/dL (ref 30.0–36.0)
MCV: 91.8 fL (ref 80.0–100.0)
Platelets: 223 10*3/uL (ref 150–400)
RBC: 3.43 MIL/uL — ABNORMAL LOW (ref 3.87–5.11)
RDW: 14.6 % (ref 11.5–15.5)
WBC: 9.4 10*3/uL (ref 4.0–10.5)
nRBC: 0 % (ref 0.0–0.2)

## 2021-12-17 LAB — BASIC METABOLIC PANEL
Anion gap: 10 (ref 5–15)
BUN: 45 mg/dL — ABNORMAL HIGH (ref 6–20)
CO2: 20 mmol/L — ABNORMAL LOW (ref 22–32)
Calcium: 8.7 mg/dL — ABNORMAL LOW (ref 8.9–10.3)
Chloride: 110 mmol/L (ref 98–111)
Creatinine, Ser: 2.26 mg/dL — ABNORMAL HIGH (ref 0.44–1.00)
GFR, Estimated: 25 mL/min — ABNORMAL LOW (ref >60)
Glucose, Bld: 118 mg/dL — ABNORMAL HIGH (ref 70–99)
Potassium: 4.3 mmol/L (ref 3.5–5.1)
Sodium: 140 mmol/L (ref 135–145)

## 2021-12-17 MED ORDER — ACETAMINOPHEN 160 MG/5ML PO SOLN: 650.0000 mg | ORAL | Status: DC | PRN

## 2021-12-17 MED ORDER — ACETAMINOPHEN 500 MG PO TABS
1000.0000 mg | ORAL_TABLET | Freq: Four times a day (QID) | ORAL | Status: DC | PRN
  Administered 2021-12-18: 1000 mg via ORAL
  Filled 2021-12-17: qty 2

## 2021-12-17 MED ORDER — GADOBUTROL 1 MMOL/ML IV SOLN
10.0000 mL | Freq: Once | INTRAVENOUS | Status: AC | PRN
  Administered 2021-12-17: 10 mL via INTRAVENOUS

## 2021-12-17 MED ORDER — ACETAMINOPHEN 650 MG RE SUPP: 650.0000 mg | RECTAL | Status: DC | PRN

## 2021-12-17 NOTE — Progress Notes (Signed)
Progress Note  Patient Name: [name] Date of Encounter: 12/17/2021  Primary Cardiologist:   Minus Breeding, MD   Subjective   She is complaining of right forearm pain.  No SOB or chest pain.   Inpatient Medications    Scheduled Meds:  aspirin EC  325 mg Oral Daily   atorvastatin  80 mg Oral Daily   carvedilol  12.5 mg Oral BID WC   Chlorhexidine Gluconate Cloth  6 each Topical Daily   hydrALAZINE  10 mg Oral Q8H   isosorbide mononitrate  30 mg Oral Daily   pantoprazole  40 mg Oral QHS   Continuous Infusions:  clevidipine     PRN Meds: acetaminophen **OR** acetaminophen (TYLENOL) oral liquid 160 mg/5 mL **OR** acetaminophen, oxyCODONE, senna-docusate   Vital Signs    Vitals:   12/16/21 1700 12/16/21 2016 12/16/21 2340 12/17/21 0448  BP: 125/88 121/82 122/82 125/77  Pulse: 90 90 95 92  Resp: 18 17 16 18   Temp: 98.4 F (36.9 C) 98.7 F (37.1 C) 98.8 F (37.1 C) 98.2 F (36.8 C)  TempSrc: Oral Oral Oral Oral  SpO2: 98% 100% 100% 100%  Weight:      Height:       No intake or output data in the 24 hours ending 12/17/21 0728  Filed Weights   12/13/21 1513 12/13/21 1745 12/14/21 2333  Weight: 97.6 kg 94.5 kg 93.8 kg    Telemetry    NSR, NSVT - Personally Reviewed  ECG    NA - Personally Reviewed  Physical Exam   GEN: No  acute distress.   Neck: No  JVD Cardiac: RRR, no murmurs, rubs, or gallops.  Respiratory: Clear   to auscultation bilaterally. GI: Soft, nontender, non-distended, normal bowel sounds  MS:  No edema; No deformity. Neuro:   Nonfocal  Psych: Oriented and appropriate   Labs    Chemistry Recent Labs  Lab 12/13/21 1513 12/13/21 1515 12/14/21 0354 12/15/21 0157 12/16/21 0213 12/17/21 0219  NA 142   < > 142 140 140 140  K 4.6   < > 4.1 4.1 4.2 4.3  CL 113*   < > 115* 112* 111 110  CO2 18*  --  19* 18* 19* 20*  GLUCOSE 113*   < > 97 113* 116* 118*  BUN 41*   < > 37* 37* 42* 45*  CREATININE 2.21*   < > 1.98* 1.96* 2.35*  2.26*  CALCIUM 9.2  --  8.9 9.1 8.7* 8.7*  PROT 7.8  --  6.3*  --   --   --   ALBUMIN 4.0  --  3.3*  --   --   --   AST 17  --  14*  --   --   --   ALT 13  --  13  --   --   --   ALKPHOS 113  --  93  --   --   --   BILITOT 0.6  --  0.8  --   --   --   GFRNONAA 25*  --  29* 29* 23* 25*  ANIONGAP 11  --  8 10 10 10    < > = values in this interval not displayed.     Hematology Recent Labs  Lab 12/15/21 0157 12/16/21 0213 12/17/21 0219  WBC 10.0 10.7* 9.4  RBC 3.56* 3.45* 3.43*  HGB 10.7* 10.5* 10.4*  HCT 33.2* 31.5* 31.5*  MCV 93.3 91.3 91.8  MCH 30.1 30.4  30.3  MCHC 32.2 33.3 33.0  RDW 14.4 14.6 14.6  PLT 233 211 223    Cardiac EnzymesNo results for input(s): TROPONINI in the last 168 hours. No results for input(s): TROPIPOC in the last 168 hours.   BNPNo results for input(s): BNP, PROBNP in the last 168 hours.   DDimer No results for input(s): DDIMER in the last 168 hours.   Radiology    No results found.  Cardiac Studies   Echocardiogram   12/14/2021  1. No left ventricular thrombus is seen, but apex is suboptimally seen.  If embolic stroke is suspected, suggest repeat imaging with Definity  contrast. Left ventricular ejection fraction, by estimation, is 20 to 25%.  The left ventricle has severely  decreased function. The left ventricle demonstrates global hypokinesis.  The left ventricular internal cavity size was mildly dilated.  Indeterminate diastolic filling due to E-A fusion. Although there is  global hypokinesis, the inferior wall and the  inferior septum appear disproportionately hypokinetic/nearly akinetic.  There is also profound LBBB-related sepatl-lateral dyssynchrony due to  LBBB.   2. Right ventricular systolic function is normal. The right ventricular  size is normal. There is normal pulmonary artery systolic pressure.   3. Left atrial size was mild to moderately dilated.   4. The mitral valve is normal in structure. Trivial mitral valve   regurgitation.   5. The aortic valve is tricuspid. Aortic valve regurgitation is not  visualized. No aortic stenosis is present.   6. The inferior vena cava is normal in size with greater than 50%  respiratory variability, suggesting right atrial pressure of 3 mmHg.   Note with contrast no evidence of LV thrombus   Patient Profile     61 y.o. female with a hx of nonischemic hypertensive cardiomyopathy improved LV function, morbid obesity, CKD stage III and hypertension who is being seen 12/14/2021 for the evaluation of reduced EF at the request of Janine Ores, NP.  She was admitted for CVA.  EF was noted to be lower than previous.    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    Hypertension:   This is being managed in the context of treating his CHF.  CKD 3B:    Creat jumped up on Entresto but trending down.    Acute CVA:   MRI pending looking for thrombus.    COVID-positive: Incidental finding  For questions or updates, please contact [place] Please consult [email or web address] for contact info under Cardiology/STEMI.   Signed, Minus Breeding, MD  12/17/2021, 7:28 AM

## 2021-12-17 NOTE — Progress Notes (Signed)
Occupational Therapy Treatment Patient Details Name: [name] MRN: [id number] DOB: 23-Mar-1961 Today's Date: 12/17/2021   History of present illness Pt is a 61 y.o. female who presented to the ED on 2/9 secondary to right facial droop, slurred speech and word retrieval difficulty. CT head 2/9: Age indeterminate small lacunar infarct in the left corona radiata. TNK given. Pt found to have COVID-19. PMH: essential hypertension, CHF, obesity with a BMI of 34.73 kg/m, and secondary cardiomyopathy.   OT comments  [name] reported new distal RUE pain and L ankle pain that started suddenly and had limited her ability to get OOB. Pt is able to get EOB with supervision and increased time while guarding RUE and LLE. Pain exacerbated by weight bearing, supinating the RUE, R elbow movement and L dorsiflexion. New pain limiting pts ability to safely ambulate in the room this date. D/c remains appropriate, OT to continue to follow acutely.    Recommendations for follow up therapy are one component of a multi-disciplinary discharge planning process, led by the attending physician.  Recommendations may be updated based on patient status, additional functional criteria and insurance authorization.    Follow Up Recommendations  No OT follow up    Assistance Recommended at Discharge PRN  Patient can return home with the following  Assist for transportation   Equipment Recommendations  None recommended by OT       Precautions / Restrictions Precautions Precautions: None Precaution Comments: covid+ Restrictions Weight Bearing Restrictions: No       Mobility Bed Mobility Overal bed mobility: Modified Independent             General bed mobility comments: significantly increased time    Transfers         ADL either performed or assessed with clinical judgement   ADL Overall ADL's : Needs assistance/impaired Eating/Feeding: Independent;Sitting   Grooming: Modified  independent;Sitting Grooming Details (indicate cue type and reason): with LUE due to painful RUE       Functional mobility during ADLs: Minimal assistance General ADL Comments: limited to bed level this session due to pain at L ankle and RUE    Extremity/Trunk Assessment Upper Extremity Assessment Upper Extremity Assessment: RUE deficits/detail RUE Deficits / Details: new pain limiting strength and ROM of distal RUE RUE: Unable to fully assess due to pain;Shoulder pain with ROM;Shoulder pain at rest RUE Sensation: WNL RUE Coordination: decreased fine motor   Lower Extremity Assessment Lower Extremity Assessment: LLE deficits/detail LLE Deficits / Details: L ankle pain limiting strenth and ROM LLE: Unable to fully assess due to pain        Vision   Vision Assessment?: No apparent visual deficits   Perception Perception Perception: Within Functional Limits   Praxis Praxis Praxis: Intact    Cognition Arousal/Alertness: Awake/alert Behavior During Therapy: WFL for tasks assessed/performed Overall Cognitive Status: Within Functional Limits for tasks assessed                  General Comments VSS on RA, pain limiting    Pertinent Vitals/ Pain       Pain Assessment Pain Assessment: 0-10 Faces Pain Scale: Hurts whole lot Pain Location: distal RUE and L ankle Pain Descriptors / Indicators: Discomfort, Grimacing, Guarding, Sore Pain Intervention(s): Limited activity within patient's tolerance, Monitored during session   Frequency  Min 2X/week        Progress Toward Goals  OT Goals(current goals can now be found in the care plan section)  Progress towards OT  goals: Progressing toward goals  Acute Rehab OT Goals Patient Stated Goal: less pain OT Goal Formulation: With patient Time For Goal Achievement: 12/29/21 Potential to Achieve Goals: Good ADL Goals Pt/caregiver will Perform Home Exercise Program: Right Upper extremity;With written HEP  provided Additional ADL Goal #1: Pt will indep complete all BADLs  Plan Discharge plan remains appropriate       AM-PAC OT "6 Clicks" Daily Activity     Outcome Measure   Help from another person eating meals?: None Help from another person taking care of personal grooming?: None Help from another person toileting, which includes using toliet, bedpan, or urinal?: None Help from another person bathing (including washing, rinsing, drying)?: None Help from another person to put on and taking off regular upper body clothing?: None Help from another person to put on and taking off regular lower body clothing?: None 6 Click Score: 24    End of Session    OT Visit Diagnosis: Hemiplegia and hemiparesis Hemiplegia - Right/Left: Right Hemiplegia - dominant/non-dominant: Dominant Hemiplegia - caused by: Cerebral infarction   Activity Tolerance Patient limited by pain              Time: 0200-0216 OT Time Calculation (min): 16 min  Charges: OT General Charges $OT Visit: 1 Visit OT Treatments $Therapeutic Activity: 8-22 mins   [name] A [name] 12/17/2021, 3:03 PM

## 2021-12-17 NOTE — Progress Notes (Addendum)
STROKE TEAM PROGRESS NOTE   INTERVAL HISTORY Patient is seen in her room with no family at the bedside.  She has undergone her cardiac MRI.  She did undergo the sleep smart study last night, but data from the monitor was inadequate.  She is complaining of new right arm pain which limits her ROM as well as left ankle pain and swelling.. Right upper extremity and left leg movements are limited due to pain today  Vitals:   12/16/21 1700 12/16/21 2016 12/16/21 2340 12/17/21 0448  BP: 125/88 121/82 122/82 125/77  Pulse: 90 90 95 92  Resp: 18 17 16 18   Temp: 98.4 F (36.9 C) 98.7 F (37.1 C) 98.8 F (37.1 C) 98.2 F (36.8 C)  TempSrc: Oral Oral Oral Oral  SpO2: 98% 100% 100% 100%  Weight:      Height:       CBC:  Recent Labs  Lab 12/13/21 1513 12/13/21 1515 12/16/21 0213 12/17/21 0219  WBC 6.5   < > 10.7* 9.4  NEUTROABS 3.5  --   --   --   HGB 11.8*   < > 10.5* 10.4*  HCT 35.5*   < > 31.5* 31.5*  MCV 92.2   < > 91.3 91.8  PLT 259   < > 211 223   < > = values in this interval not displayed.    Basic Metabolic Panel:  Recent Labs  Lab 12/16/21 0213 12/17/21 0219  NA 140 140  K 4.2 4.3  CL 111 110  CO2 19* 20*  GLUCOSE 116* 118*  BUN 42* 45*  CREATININE 2.35* 2.26*  CALCIUM 8.7* 8.7*    Lipid Panel:  Recent Labs  Lab 12/14/21 0354  CHOL 172  TRIG 53  HDL 60  CHOLHDL 2.9  VLDL 11  LDLCALC 101*    HgbA1c:  Recent Labs  Lab 12/14/21 0354  HGBA1C 5.7*    Urine Drug Screen: No results for input(s): LABOPIA, COCAINSCRNUR, LABBENZ, AMPHETMU, THCU, LABBARB in the last 168 hours.  Alcohol Level  Recent Labs  Lab 12/13/21 1505  ETH <10     IMAGING past 24 hours No results found.  PHYSICAL EXAM  Physical Exam  Constitutional: Obese middle-aged African-American lady not in distress Psych: Affect appropriate to situation Cardiovascular: Normal rate and regular rhythm.  Respiratory: Effort normal, non-labored breathing  Neuro: Mental  Status: Patient is awake, alert, oriented to person, place, month, year, and situation. Patient is able to give a clear and coherent history. No signs of aphasia or neglect Cranial Nerves: II: Visual Fields are full. Pupils are equal, round, and reactive to light.   III,IV, VI: EOMI without ptosis or diploplia.  V: Facial sensation is symmetric to light touch VII: Mild right facial asymmetry with left sided facial droop.   VIII: Hearing is intact to voice XII: Tongue protrudes midline without atrophy or fasciculations.  Motor: Tone is normal. Bulk is normal. 5/5 strength in LUE and BLE, 4/5 in RUE with ROM limited secondary to pain  Diminished fine finger movements on the right.  Right upper extremity and left leg movements are limited due to pain today Sensory: Sensation is symmetric to light touch in the arms and legs. No extinction to DSS present.   Cerebellar: FNF  intact on left, unable to perform on right  NIH stroke scale 1.  Premorbid modified Rankin score 0  ASSESSMENT/PLAN [name] is a 61 y.o. female with history of essential hypertension, CHF, obesity with a  BMI of 34.73 kg/m, and secondary cardiomyopathy presenting with an acute onset of right facial droop and speech disturbance. TNKase was administered on 2/9 1525. Initial NIHss 4 for facial droop and dysarthria.   Stroke: left MCA infarct, embolic pattern, likely due to cardiomyopathy with low EF Code Stroke Age indeterminate small lacunar infarct in the left corona radiata. Age advanced white matter hypoattenuation, nonspecific but potentially related to chronic microvascular disease or demyelination. MRI with contrast could further evaluate if clinically indicated. MRI small acute left frontoparietal MCA branch infarct.  Moderate changes of small vessel disease.  Numerous chronic microhemorrhages. MRA brain and neck no large vessel stenosis or occlusion.   2D Echo 20 to 25%. The left ventricle has severely  decreased function. The left ventricle demonstrates global hypokinesis.  2D echo with contrast no definite LV clot LDL 101 HgbA1c 5.7 VTE prophylaxis - SCDs No antithrombotic prior to admission, now on Aspirin 325mg  daily  Therapy recommendations: No PT follow-up  disposition: Home CHF History of CHF following with cardiology Home medication including Entresto, Coreg, spironolactone EF 20 to 25% Repeat 2D echo with contrast showed no LV thrombus Gradually resume home meds after 24 hours of permissive hypertension Entresto now on hold r/t Cr, increased BB - cardiology  Cardiac MRI today MRI to look for intracardiac thrombus as well as assess for infiltrative etiology to reduced EF.  Hypertension Home meds: carvedilol, amlodipine, spironolactone, Entresto Stable Restarting BP meds after 24 hours of permissive hypertension Coreg 12.5mg  resumed Long-term BP goal normotensive  Hyperlipidemia Home meds:  Atorvastatin 40mg ,  Increased to to Atorvastatin 80mg  LDL 101, goal < 70 Continue statin at discharge  Other Stroke Risk Factors Obesity, Body mass index is 33.38 kg/m., BMI >/= 30 associated with increased stroke risk, recommend weight loss, diet and exercise as appropriate  Hx stroke/TIA Age indeterminate small lacunar infarct in the left corona radiata Congestive heart failure Spironolactone Keep patient euvolemic  Other Active Problems Cardiomyopathy- Dr. Percival Spanish patient AKI BUN/Creatinine 37/1.98-> 37/1.96 -> 42/2.35 GFR 29 Entresto d/c'd COVID positive Incidentally COVID positive, asymptomatic  Hospital day # 4  Patient seen and examined by NP/APP with MD. MD to update note as needed.   Tse [place]ito , MSN, AGACNP-BC [facility] See Amion for schedule and pager information 12/17/2021 2:30 PM   I have personally obtained history,examined this patient, reviewed notes, independently viewed imaging studies, participated in medical decision  making and plan of care.ROS completed by me personally and pertinent positives fully documented  I have made any additions or clarifications directly to the above note. Agree with note above.  Patient did sign consent and participate in the sleep smart study and an overnight NOx 3 monitor however the study had technical limitations and will need to be repeated tonight.  Patient also has new complaints of left leg pain and swelling we will check lower extremity Dopplers for DVT.  She also has right upper extremity pain which may be musculoskeletal and will treat with NSAIDs.  Await results of cardiac MRI.  Discussed with Dr. Percival Spanish. Greater than 50% time during this 35-minute visit was spent on counseling and coordination of care and evaluation of leg pain and arm pain and discussion with cardiology and answered questions.  Antony Contras, MD Medical Director [facility] Pager: [phone] 12/17/2021 3:34 PM   To contact Stroke Continuity provider, please refer to [email or web address] After hours, contact General Neurology

## 2021-12-18 ENCOUNTER — Inpatient Hospital Stay (HOSPITAL_COMMUNITY): Payer: Managed Care, Other (non HMO)

## 2021-12-18 DIAGNOSIS — M79662 Pain in left lower leg: Secondary | ICD-10-CM

## 2021-12-18 LAB — BASIC METABOLIC PANEL
Anion gap: 10 (ref 5–15)
BUN: 44 mg/dL — ABNORMAL HIGH (ref 6–20)
CO2: 19 mmol/L — ABNORMAL LOW (ref 22–32)
Calcium: 9.2 mg/dL (ref 8.9–10.3)
Chloride: 108 mmol/L (ref 98–111)
Creatinine, Ser: 2.28 mg/dL — ABNORMAL HIGH (ref 0.44–1.00)
GFR, Estimated: 24 mL/min — ABNORMAL LOW (ref >60)
Glucose, Bld: 105 mg/dL — ABNORMAL HIGH (ref 70–99)
Potassium: 4 mmol/L (ref 3.5–5.1)
Sodium: 137 mmol/L (ref 135–145)

## 2021-12-18 LAB — CBC
HCT: 32.4 % — ABNORMAL LOW (ref 36.0–46.0)
Hemoglobin: 10.3 g/dL — ABNORMAL LOW (ref 12.0–15.0)
MCH: 29.5 pg (ref 26.0–34.0)
MCHC: 31.8 g/dL (ref 30.0–36.0)
MCV: 92.8 fL (ref 80.0–100.0)
Platelets: 224 10*3/uL (ref 150–400)
RBC: 3.49 MIL/uL — ABNORMAL LOW (ref 3.87–5.11)
RDW: 14.4 % (ref 11.5–15.5)
WBC: 9.2 10*3/uL (ref 4.0–10.5)
nRBC: 0 % (ref 0.0–0.2)

## 2021-12-18 MED ORDER — HYDRALAZINE HCL 10 MG PO TABS: 10.0000 mg | ORAL_TABLET | Freq: Three times a day (TID) | ORAL | 1 refills | Status: DC

## 2021-12-18 MED ORDER — CARVEDILOL 6.25 MG PO TABS
18.7500 mg | ORAL_TABLET | Freq: Two times a day (BID) | ORAL | 1 refills | Status: DC
Start: 2021-12-18 — End: 2022-01-30

## 2021-12-18 MED ORDER — CLOPIDOGREL BISULFATE 75 MG PO TABS: 75.0000 mg | ORAL_TABLET | Freq: Every day | ORAL | 0 refills | Status: DC

## 2021-12-18 MED ORDER — ISOSORBIDE MONONITRATE ER 30 MG PO TB24
30.0000 mg | ORAL_TABLET | Freq: Every day | ORAL | 1 refills | Status: DC
Start: 2021-12-19 — End: 2022-09-12

## 2021-12-18 MED ORDER — CLOPIDOGREL BISULFATE 75 MG PO TABS: 75.0000 mg | ORAL_TABLET | Freq: Every day | ORAL | Status: DC

## 2021-12-18 MED ORDER — ASPIRIN 325 MG PO TBEC: 325.0000 mg | DELAYED_RELEASE_TABLET | Freq: Every day | ORAL | 0 refills | Status: DC

## 2021-12-18 MED ORDER — CARVEDILOL 6.25 MG PO TABS: 18.7500 mg | ORAL_TABLET | Freq: Two times a day (BID) | ORAL | Status: DC

## 2021-12-18 MED ORDER — ATORVASTATIN CALCIUM 80 MG PO TABS: 80.0000 mg | ORAL_TABLET | Freq: Every day | ORAL | 1 refills | Status: AC

## 2021-12-18 MED ORDER — ASPIRIN 81 MG PO TBEC: 81.0000 mg | DELAYED_RELEASE_TABLET | Freq: Every day | ORAL | 11 refills | Status: DC

## 2021-12-18 MED ORDER — ASPIRIN EC 81 MG PO TBEC: 81.0000 mg | DELAYED_RELEASE_TABLET | Freq: Every day | ORAL | Status: DC

## 2021-12-18 NOTE — Progress Notes (Signed)
Progress Note  Patient Name: [name] Date of Encounter: 12/18/2021  Primary Cardiologist:   Minus Breeding, MD   Subjective   Today she is complaining of severe left foot pain. Denies SOB.  Right groin is mildly tender.  Right forearm was not as much of a complaint.    Inpatient Medications    Scheduled Meds:  aspirin EC  325 mg Oral Daily   atorvastatin  80 mg Oral Daily   carvedilol  12.5 mg Oral BID WC   Chlorhexidine Gluconate Cloth  6 each Topical Daily   hydrALAZINE  10 mg Oral Q8H   isosorbide mononitrate  30 mg Oral Daily   pantoprazole  40 mg Oral QHS   Continuous Infusions:  clevidipine     PRN Meds: acetaminophen **OR** acetaminophen (TYLENOL) oral liquid 160 mg/5 mL **OR** acetaminophen, oxyCODONE, senna-docusate   Vital Signs    Vitals:   12/17/21 0448 12/17/21 1730 12/18/21 0433 12/18/21 0633  BP: 125/77 128/86 126/84 116/81  Pulse: 92 75 92   Resp: 18 16 (!) 23   Temp: 98.2 F (36.8 C) 98.5 F (36.9 C) 99.2 F (37.3 C)   TempSrc: Oral Oral Oral   SpO2: 100% 100% 100%   Weight:      Height:        Intake/Output Summary (Last 24 hours) at 12/18/2021 0812 Last data filed at 12/18/2021 0045 Gross per 24 hour  Intake --  Output 300 ml  Net -300 ml    Filed Weights   12/13/21 1513 12/13/21 1745 12/14/21 2333  Weight: 97.6 kg 94.5 kg 93.8 kg    Telemetry    NSR, NSVT - Personally Reviewed  ECG    NA - Personally Reviewed  Physical Exam   GEN: No  acute distress.   Neck: No  JVD Cardiac: RRR, no murmurs, rubs, or gallops.  Respiratory: Clear   to auscultation bilaterally. GI: Soft, nontender, non-distended, normal bowel sounds  MS:  No edema; No deformity.    Left foot swelling with tenderness to touch Neuro:   Nonfocal  Psych: Oriented and appropriate    Labs    Chemistry Recent Labs  Lab 12/13/21 1513 12/13/21 1515 12/14/21 0354 12/15/21 0157 12/16/21 0213 12/17/21 0219 12/18/21 0658  NA 142   < > 142   < > 140  140 137  K 4.6   < > 4.1   < > 4.2 4.3 4.0  CL 113*   < > 115*   < > 111 110 108  CO2 18*  --  19*   < > 19* 20* 19*  GLUCOSE 113*   < > 97   < > 116* 118* 105*  BUN 41*   < > 37*   < > 42* 45* 44*  CREATININE 2.21*   < > 1.98*   < > 2.35* 2.26* 2.28*  CALCIUM 9.2  --  8.9   < > 8.7* 8.7* 9.2  PROT 7.8  --  6.3*  --   --   --   --   ALBUMIN 4.0  --  3.3*  --   --   --   --   AST 17  --  14*  --   --   --   --   ALT 13  --  13  --   --   --   --   ALKPHOS 113  --  93  --   --   --   --  BILITOT 0.6  --  0.8  --   --   --   --   GFRNONAA 25*  --  29*   < > 23* 25* 24*  ANIONGAP 11  --  8   < > 10 10 10    < > = values in this interval not displayed.     Hematology Recent Labs  Lab 12/16/21 0213 12/17/21 0219 12/18/21 0658  WBC 10.7* 9.4 9.2  RBC 3.45* 3.43* 3.49*  HGB 10.5* 10.4* 10.3*  HCT 31.5* 31.5* 32.4*  MCV 91.3 91.8 92.8  MCH 30.4 30.3 29.5  MCHC 33.3 33.0 31.8  RDW 14.6 14.6 14.4  PLT 211 223 224    Cardiac EnzymesNo results for input(s): TROPONINI in the last 168 hours. No results for input(s): TROPIPOC in the last 168 hours.   BNPNo results for input(s): BNP, PROBNP in the last 168 hours.   DDimer No results for input(s): DDIMER in the last 168 hours.   Radiology    MR CARDIAC MORPHOLOGY W WO CONTRAST  Result Date: 12/17/2021 CLINICAL DATA:  Cardiomyopathy,?LV thrombus EXAM: CARDIAC MRI TECHNIQUE: The patient was scanned on a 1.5 Tesla GE magnet. A dedicated cardiac coil was used. Functional imaging was done using Fiesta sequences. 2,3, and 4 chamber views were done to assess for RWMA's. Modified Simpson's rule using a short axis stack was used to calculate an ejection fraction on a dedicated work Conservation officer, nature. The patient received 10 cc of Gadavist. After 10 minutes inversion recovery sequences were used to assess for infiltration and scar tissue. CONTRAST:  Gadavist 10 cc FINDINGS: Limited images of the lung fields showed no gross  abnormalities. Moderate left ventricular dilation with normal wall thickness. Diffuse severe hypokinesis with septal-lateral dyssynchrony consistent with LBBB, LV EF 20%. No LV thrombus noted. Normal right ventricular size with low normal to mildly decreased systolic function, RV EF 43%. Mild left atrial enlargement. Normal right atrium. Trivial mitral regurgitation. Trileaflet aortic valve with no stenosis or regurgitation. Delayed enhancement imaging: In one view, there was a small area of subendocardial late gadolinium enhancement (LGE) at the apex, but this could not be reproduced in other views so may have been artifactual. MEASUREMENTS: MEASUREMENTS LVEDV 306 mL LVSV 60 mL LVEF 20% RVEDV 137 mL RVSV 60 mL RVEF 44% T1 1129, ECV 31% IMPRESSION: 1. Moderate LV dilation with diffuse severe LV hypokinesis and prominent septal-lateral dyssynchrony consistent with LBBB. EF 20%. 2.  No LV thrombus. 3. Normal RV size with low normal to mildly decreased systolic function, EF 15%. 4. Possible very small area of subendocardial LGE at the apex but may be artifactual. Overall, no evidence for large prior MI, myocarditis, or infiltrative disease like amyloidosis. 5. Mildly elevated extracellular volume percentage in the septum, this is likely nonspecific. With minimal to no LV scarring and prominent septal-lateral dyssynchrony, this patient would likely be good CRT candidate. Dalton Mclean Electronically Signed   By: Loralie Champagne M.D.   On: 12/17/2021 15:31    Cardiac Studies   Echocardiogram   12/14/2021  1. No left ventricular thrombus is seen, but apex is suboptimally seen.  If embolic stroke is suspected, suggest repeat imaging with Definity  contrast. Left ventricular ejection fraction, by estimation, is 20 to 25%.  The left ventricle has severely  decreased function. The left ventricle demonstrates global hypokinesis.  The left ventricular internal cavity size was mildly dilated.  Indeterminate diastolic  filling due to E-A fusion. Although there is  global  hypokinesis, the inferior wall and the  inferior septum appear disproportionately hypokinetic/nearly akinetic.  There is also profound LBBB-related sepatl-lateral dyssynchrony due to  LBBB.   2. Right ventricular systolic function is normal. The right ventricular  size is normal. There is normal pulmonary artery systolic pressure.   3. Left atrial size was mild to moderately dilated.   4. The mitral valve is normal in structure. Trivial mitral valve  regurgitation.   5. The aortic valve is tricuspid. Aortic valve regurgitation is not  visualized. No aortic stenosis is present.   6. The inferior vena cava is normal in size with greater than 50%  respiratory variability, suggesting right atrial pressure of 3 mmHg.   Note with contrast no evidence of LV thrombus   Patient Profile     61 y.o. female with a hx of nonischemic hypertensive cardiomyopathy improved LV function, morbid obesity, CKD stage III and hypertension who is being seen 12/14/2021 for the evaluation of reduced EF at the request of Janine Ores, NP.  She was admitted for CVA.  EF was noted to be lower than previous.    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    Soft BPs.  I will however, given NSVT on her monitor, try to increase the beta blocker slightly while she is here.    Hypertension:   This is being managed in the context of treating his CHF   CKD 3B:    Creat jumped up on Entresto.  Unchanged today.     Acute CVA:   No thrombus.  No evidence of infiltrative disease.  Will likely need CRT if EF does not improve after med titration particularly if BP and creat don't allow med titration.  No change in therapy and I will follow as an out patient.  We will arrange TOC.   COVID-positive: Incidental finding  For questions or updates, please  contact [place] Please consult [email or web address] for contact info under Cardiology/STEMI.   Signed, Minus Breeding, MD  12/18/2021, 8:12 AM

## 2021-12-18 NOTE — Progress Notes (Signed)
Bilateral lower extremity venous duplex completed.  12/18/2021 3:12 PM Kelby Aline., MHA, RVT, RDCS, RDMS

## 2021-12-18 NOTE — TOC CAGE-AID Note (Signed)
Transition of Care [facility]) - CAGE-AID Screening   Patient Details  Name: [name] MRN: [id number] Date of Birth: 11/28/1960  Transition of Care [facility]) CM/SW Contact:    Karol Skarzynski C Tarpley-Carter, LCSWA Phone Number: 12/18/2021, 2:22 PM   Clinical Narrative: Pt participated in [place].  Pt stated she does not use substance, but drinks ETOH (beer) daily.  Pt was offered resources, due to usage of ETOH.   Aubert Choyce Tarpley-Carter, MSW, LCSW-A Pronouns:  She/Her/Hers [place] Transitions of Care Clinical Social Worker Direct Number:  [phone] [email or web address]    CAGE-AID Screening: Substance Abuse Screening unable to be completed due to: : Patient unable to participate  Have You Ever Felt You Ought to Cut Down on Your Drinking or Drug Use?: No Have People Annoyed You By Critizing Your Drinking Or Drug Use?: No Have You Felt Bad Or Guilty About Your Drinking Or Drug Use?: No Have You Ever Had a Drink or Used Drugs First Thing In The Morning to Steady Your Nerves or to Get Rid of a Hangover?: No CAGE-AID Score: 0  Substance Abuse Education Offered: Yes  Substance abuse interventions: Scientist, clinical (histocompatibility and immunogenetics)

## 2021-12-18 NOTE — Discharge Summary (Addendum)
Stroke Discharge Summary  Patient ID: [name]   MRN: [id number]      DOB: 03/10/1961  Date of Admission: 12/13/2021 Date of Discharge: 12/18/2021  Attending Physician:  Stroke, Md, MD, Stroke MD Consultant(s):    cardiology  Patient's PCP:  Velna Hatchet, MD  DISCHARGE DIAGNOSIS: left sided MCA stroke Principal Problem:   Acute ischemic stroke ([place]) -left MCA infarct likely embolic from cardiomyopathy with low EF COVID-positive-incidental finding Acute on chronic systolic heart failure and diastolic heart failure Hypertension Chronic kidney disease stage IIIb  Allergies as of 12/18/2021       Reactions   Latex Itching   Chlorhexidine Gluconate Hives   Unknown if this is the cause of the hives, raised areas around lab draw sites. No tape and area wiped with chlorhexidine.        Medication List     STOP taking these medications    Entresto 49-51 MG Generic drug: sacubitril-valsartan   methylPREDNISolone 4 MG Tbpk tablet Commonly known as: MEDROL DOSEPAK   terbinafine 250 MG tablet Commonly known as: LamISIL       TAKE these medications    acetaminophen 325 MG tablet Commonly known as: TYLENOL Take 2 tablets (650 mg total) by mouth every 4 (four) hours as needed for headache or mild pain.   amLODipine 5 MG tablet Commonly known as: NORVASC Take 1 tablet (5 mg total) by mouth daily.   aspirin 81 MG EC tablet Take 1 tablet (81 mg total) by mouth daily. Swallow whole. Start taking on: December 19, 2021   atorvastatin 80 MG tablet Commonly known as: LIPITOR Take 1 tablet (80 mg total) by mouth daily. Start taking on: December 19, 2021 What changed:  medication strength how much to take additional instructions   carvedilol 6.25 MG tablet Commonly known as: COREG Take 3 tablets (18.75 mg total) by mouth 2 (two) times daily with a meal. What changed:  medication strength See the new instructions.   clopidogrel 75 MG tablet Commonly known as:  PLAVIX Take 1 tablet (75 mg total) by mouth daily.   hydrALAZINE 10 MG tablet Commonly known as: APRESOLINE Take 1 tablet (10 mg total) by mouth every 8 (eight) hours.   HYDROcodone-acetaminophen 5-325 MG tablet Commonly known as: NORCO/VICODIN Take 1 tablet by mouth every 6 (six) hours as needed for moderate pain.   isosorbide mononitrate 30 MG 24 hr tablet Commonly known as: IMDUR Take 1 tablet (30 mg total) by mouth daily. Start taking on: December 19, 2021   meloxicam 7.5 MG tablet Commonly known as: Mobic Take 1 tablet (7.5 mg total) by mouth daily.   spironolactone 25 MG tablet Commonly known as: ALDACTONE Take 25 mg by mouth daily.   zolpidem 5 MG tablet Commonly known as: AMBIEN Take 5 mg by mouth at bedtime as needed for sleep.        LABORATORY STUDIES CBC    Component Value Date/Time   WBC 9.2 12/18/2021 0658   RBC 3.49 (L) 12/18/2021 0658   HGB 10.3 (L) 12/18/2021 0658   HCT 32.4 (L) 12/18/2021 0658   PLT 224 12/18/2021 0658   MCV 92.8 12/18/2021 0658   MCH 29.5 12/18/2021 0658   MCHC 31.8 12/18/2021 0658   RDW 14.4 12/18/2021 0658   LYMPHSABS 1.9 12/13/2021 1513   MONOABS 0.9 12/13/2021 1513   EOSABS 0.2 12/13/2021 1513   BASOSABS 0.0 12/13/2021 1513   CMP    Component Value Date/Time  NA 137 12/18/2021 0658   K 4.0 12/18/2021 0658   CL 108 12/18/2021 0658   CO2 19 (L) 12/18/2021 0658   GLUCOSE 105 (H) 12/18/2021 0658   BUN 44 (H) 12/18/2021 0658   CREATININE 2.28 (H) 12/18/2021 0658   CREATININE 1.53 (H) 08/11/2015 0905   CALCIUM 9.2 12/18/2021 0658   PROT 6.3 (L) 12/14/2021 0354   ALBUMIN 3.3 (L) 12/14/2021 0354   AST 14 (L) 12/14/2021 0354   ALT 13 12/14/2021 0354   ALKPHOS 93 12/14/2021 0354   BILITOT 0.8 12/14/2021 0354   GFRNONAA 24 (L) 12/18/2021 0658   GFRNONAA 29 (L) 01/30/2015 1145   GFRAA 34 (L) 01/30/2015 1145   COAGS Lab Results  Component Value Date   INR 1.0 12/13/2021   INR 1.0 05/13/2009   INR 1.0 03/27/2008    Lipid Panel    Component Value Date/Time   CHOL 172 12/14/2021 0354   TRIG 53 12/14/2021 0354   HDL 60 12/14/2021 0354   CHOLHDL 2.9 12/14/2021 0354   VLDL 11 12/14/2021 0354   LDLCALC 101 (H) 12/14/2021 0354   HgbA1C  Lab Results  Component Value Date   HGBA1C 5.7 (H) 12/14/2021   Urinalysis    Component Value Date/Time   COLORURINE YELLOW 01/18/2010 2053   APPEARANCEUR CLEAR 01/18/2010 2053   LABSPEC 1.022 01/18/2010 2053   PHURINE 5.0 01/18/2010 2053   GLUCOSEU NEGATIVE 01/18/2010 2053   GLUCOSEU NEGATIVE 12/09/2006 1110   [place] 01/18/2010 2053   BILIRUBINUR NEGATIVE 01/18/2010 2053   KETONESUR NEGATIVE 01/18/2010 2053   PROTEINUR NEGATIVE 01/18/2010 2053   UROBILINOGEN 0.2 01/18/2010 2053   NITRITE NEGATIVE 01/18/2010 2053   LEUKOCYTESUR  01/18/2010 2053    NEGATIVE MICROSCOPIC NOT DONE ON URINES WITH NEGATIVE PROTEIN, BLOOD, LEUKOCYTES, NITRITE, OR GLUCOSE <1000 mg/dL.   Urine Drug Screen No results found for: LABOPIA, COCAINSCRNUR, LABBENZ, AMPHETMU, THCU, LABBARB  Alcohol Level    Component Value Date/Time   ETH <10 12/13/2021 1505     SIGNIFICANT DIAGNOSTIC STUDIES MR ANGIO HEAD WO CONTRAST  Result Date: 12/14/2021 CLINICAL DATA:  Neuro deficit, acute, stroke suspected. Right facial droop and aphasia. EXAM: MRI HEAD WITHOUT CONTRAST MRA HEAD WITHOUT CONTRAST MRA NECK WITHOUT CONTRAST TECHNIQUE: Multiplanar, multiecho pulse sequences of the brain and surrounding structures were obtained without intravenous contrast. Angiographic images of the Circle of Willis were obtained using MRA technique without intravenous contrast. Angiographic images of the neck were obtained using MRA technique without intravenous contrast. Carotid stenosis measurements (when applicable) are obtained utilizing NASCET criteria, using the distal internal carotid diameter as the denominator. COMPARISON:  Head CT 12/13/2021 FINDINGS: MRI HEAD FINDINGS Brain: There are small acute  cortical infarcts in the left frontoparietal region (MCA territory). There are numerous chronic microhemorrhages throughout both cerebral hemispheres, including prominent involvement of the thalami basal ganglia, and in the cerebellum and brainstem, most likely secondary to chronic hypertension. Patchy T2 hyperintensities in the cerebral white matter and pons are nonspecific but compatible with moderately age advanced chronic small vessel ischemic disease. Chronic lacunar infarcts are noted in the centrum semiovale/corona radiata bilaterally, basal ganglia, and thalami. The ventricles and sulci are within normal limits for age. No mass, midline shift, or extra-axial fluid collection is identified. Vascular: Major intracranial vascular flow voids are preserved. Skull and upper cervical spine: Unremarkable bone marrow signal. Sinuses/Orbits: Unremarkable orbits. Mild mucosal thickening in the maxillary sinuses. Clear mastoid air cells. Other: None. MRA HEAD FINDINGS The intracranial vertebral arteries are widely patent  to the basilar. Patent PICA, AICA, and SCA origins are seen bilaterally. The basilar artery is widely patent with a small fenestration incidentally noted proximally. There are right larger than left posterior communicating arteries. Both PCAs are patent without evidence of a significant proximal stenosis. The internal carotid arteries are widely patent from skull base to carotid termini. There is a 2 mm bulge from the left paraclinoid ICA. A 2 mm bulge at the left posterior communicating artery origin is most compatible with an infundibulum. ACAs and MCAs are patent without evidence of a proximal branch occlusion or significant proximal stenosis. A fenestration is incidentally noted in the anterior communicating region. MRA NECK FINDINGS There is a standard 3 vessel aortic arch. The proximal common carotid arteries are tortuous with associated signal dropout greater on the right which is likely  technical. The remainder of the common carotid arteries and included portions of the cervical internal arteries are widely patent without evidence of stenosis or dissection. The vertebral arteries are codominant with antegrade flow bilaterally. Poor signal in the proximal right V1 segment is likely technical, with both vertebral arteries otherwise appearing widely patent without evidence of a significant stenosis or dissection. IMPRESSION: 1. 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 2. Moderate chronic small vessel ischemic disease with multiple chronic lacunar infarcts. 3. Numerous chronic microhemorrhages likely related to chronic hypertension. 4. No major intracranial arterial occlusion or significant proximal stenosis. 5. 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 6. Widely patent cervical carotid and vertebral arteries. Electronically Signed   By: Logan Bores M.D.   On: 12/14/2021 19:39   MR ANGIO NECK WO CONTRAST  Result Date: 12/14/2021 CLINICAL DATA:  Neuro deficit, acute, stroke suspected. Right facial droop and aphasia. EXAM: MRI HEAD WITHOUT CONTRAST MRA HEAD WITHOUT CONTRAST MRA NECK WITHOUT CONTRAST TECHNIQUE: Multiplanar, multiecho pulse sequences of the brain and surrounding structures were obtained without intravenous contrast. Angiographic images of the Circle of Willis were obtained using MRA technique without intravenous contrast. Angiographic images of the neck were obtained using MRA technique without intravenous contrast. Carotid stenosis measurements (when applicable) are obtained utilizing NASCET criteria, using the distal internal carotid diameter as the denominator. COMPARISON:  Head CT 12/13/2021 FINDINGS: MRI HEAD FINDINGS Brain: There are small acute cortical infarcts in the left frontoparietal region (MCA territory). There are numerous chronic microhemorrhages throughout both cerebral hemispheres, including prominent involvement of the  thalami basal ganglia, and in the cerebellum and brainstem, most likely secondary to chronic hypertension. Patchy T2 hyperintensities in the cerebral white matter and pons are nonspecific but compatible with moderately age advanced chronic small vessel ischemic disease. Chronic lacunar infarcts are noted in the centrum semiovale/corona radiata bilaterally, basal ganglia, and thalami. The ventricles and sulci are within normal limits for age. No mass, midline shift, or extra-axial fluid collection is identified. Vascular: Major intracranial vascular flow voids are preserved. Skull and upper cervical spine: Unremarkable bone marrow signal. Sinuses/Orbits: Unremarkable orbits. Mild mucosal thickening in the maxillary sinuses. Clear mastoid air cells. Other: None. MRA HEAD FINDINGS The intracranial vertebral arteries are widely patent to the basilar. Patent PICA, AICA, and SCA origins are seen bilaterally. The basilar artery is widely patent with a small fenestration incidentally noted proximally. There are right larger than left posterior communicating arteries. Both PCAs are patent without evidence of a significant proximal stenosis. The internal carotid arteries are widely patent from skull base to carotid termini. There is a 2 mm bulge from the left paraclinoid  ICA. A 2 mm bulge at the left posterior communicating artery origin is most compatible with an infundibulum. ACAs and MCAs are patent without evidence of a proximal branch occlusion or significant proximal stenosis. A fenestration is incidentally noted in the anterior communicating region. MRA NECK FINDINGS There is a standard 3 vessel aortic arch. The proximal common carotid arteries are tortuous with associated signal dropout greater on the right which is likely technical. The remainder of the common carotid arteries and included portions of the cervical internal arteries are widely patent without evidence of stenosis or dissection. The vertebral arteries  are codominant with antegrade flow bilaterally. Poor signal in the proximal right V1 segment is likely technical, with both vertebral arteries otherwise appearing widely patent without evidence of a significant stenosis or dissection. IMPRESSION: 1. 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 2. Moderate chronic small vessel ischemic disease with multiple chronic lacunar infarcts. 3. Numerous chronic microhemorrhages likely related to chronic hypertension. 4. No major intracranial arterial occlusion or significant proximal stenosis. 5. 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 6. Widely patent cervical carotid and vertebral arteries. Electronically Signed   By: Logan Bores M.D.   On: 12/14/2021 19:39   MR BRAIN WO CONTRAST  Result Date: 12/14/2021 CLINICAL DATA:  Neuro deficit, acute, stroke suspected. Right facial droop and aphasia. EXAM: MRI HEAD WITHOUT CONTRAST MRA HEAD WITHOUT CONTRAST MRA NECK WITHOUT CONTRAST TECHNIQUE: Multiplanar, multiecho pulse sequences of the brain and surrounding structures were obtained without intravenous contrast. Angiographic images of the Circle of Willis were obtained using MRA technique without intravenous contrast. Angiographic images of the neck were obtained using MRA technique without intravenous contrast. Carotid stenosis measurements (when applicable) are obtained utilizing NASCET criteria, using the distal internal carotid diameter as the denominator. COMPARISON:  Head CT 12/13/2021 FINDINGS: MRI HEAD FINDINGS Brain: There are small acute cortical infarcts in the left frontoparietal region (MCA territory). There are numerous chronic microhemorrhages throughout both cerebral hemispheres, including prominent involvement of the thalami basal ganglia, and in the cerebellum and brainstem, most likely secondary to chronic hypertension. Patchy T2 hyperintensities in the cerebral white matter and pons are nonspecific but compatible  with moderately age advanced chronic small vessel ischemic disease. Chronic lacunar infarcts are noted in the centrum semiovale/corona radiata bilaterally, basal ganglia, and thalami. The ventricles and sulci are within normal limits for age. No mass, midline shift, or extra-axial fluid collection is identified. Vascular: Major intracranial vascular flow voids are preserved. Skull and upper cervical spine: Unremarkable bone marrow signal. Sinuses/Orbits: Unremarkable orbits. Mild mucosal thickening in the maxillary sinuses. Clear mastoid air cells. Other: None. MRA HEAD FINDINGS The intracranial vertebral arteries are widely patent to the basilar. Patent PICA, AICA, and SCA origins are seen bilaterally. The basilar artery is widely patent with a small fenestration incidentally noted proximally. There are right larger than left posterior communicating arteries. Both PCAs are patent without evidence of a significant proximal stenosis. The internal carotid arteries are widely patent from skull base to carotid termini. There is a 2 mm bulge from the left paraclinoid ICA. A 2 mm bulge at the left posterior communicating artery origin is most compatible with an infundibulum. ACAs and MCAs are patent without evidence of a proximal branch occlusion or significant proximal stenosis. A fenestration is incidentally noted in the anterior communicating region. MRA NECK FINDINGS There is a standard 3 vessel aortic arch. The proximal common carotid arteries are tortuous with associated signal dropout greater on the  right which is likely technical. The remainder of the common carotid arteries and included portions of the cervical internal arteries are widely patent without evidence of stenosis or dissection. The vertebral arteries are codominant with antegrade flow bilaterally. Poor signal in the proximal right V1 segment is likely technical, with both vertebral arteries otherwise appearing widely patent without evidence of a  significant stenosis or dissection. IMPRESSION: 1. 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 2. Moderate chronic small vessel ischemic disease with multiple chronic lacunar infarcts. 3. Numerous chronic microhemorrhages likely related to chronic hypertension. 4. No major intracranial arterial occlusion or significant proximal stenosis. 5. 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 6. Widely patent cervical carotid and vertebral arteries. Electronically Signed   By: Logan Bores M.D.   On: 12/14/2021 19:39   MR CARDIAC MORPHOLOGY W WO CONTRAST  Result Date: 12/17/2021 CLINICAL DATA:  Cardiomyopathy,?LV thrombus EXAM: CARDIAC MRI TECHNIQUE: The patient was scanned on a 1.5 Tesla GE magnet. A dedicated cardiac coil was used. Functional imaging was done using [place] sequences. 2,3, and 4 chamber views were done to assess for RWMA's. Modified Simpson's rule using a short axis stack was used to calculate an ejection fraction on a dedicated work Conservation officer, nature. The patient received 10 cc of Gadavist. After 10 minutes inversion recovery sequences were used to assess for infiltration and scar tissue. CONTRAST:  Gadavist 10 cc FINDINGS: Limited images of the lung fields showed no gross abnormalities. Moderate left ventricular dilation with normal wall thickness. Diffuse severe hypokinesis with septal-lateral dyssynchrony consistent with LBBB, LV EF 20%. No LV thrombus noted. Normal right ventricular size with low normal to mildly decreased systolic function, RV EF 65%. Mild left atrial enlargement. Normal right atrium. Trivial mitral regurgitation. Trileaflet aortic valve with no stenosis or regurgitation. Delayed enhancement imaging: In one view, there was a small area of subendocardial late gadolinium enhancement (LGE) at the apex, but this could not be reproduced in other views so may have been artifactual. MEASUREMENTS: MEASUREMENTS LVEDV 306 mL LVSV 60 mL LVEF  20% RVEDV 137 mL RVSV 60 mL RVEF 44% T1 1129, ECV 31% IMPRESSION: 1. Moderate LV dilation with diffuse severe LV hypokinesis and prominent septal-lateral dyssynchrony consistent with LBBB. EF 20%. 2.  No LV thrombus. 3. Normal RV size with low normal to mildly decreased systolic function, EF 03%. 4. Possible very small area of subendocardial LGE at the apex but may be artifactual. Overall, no evidence for large prior MI, myocarditis, or infiltrative disease like amyloidosis. 5. Mildly elevated extracellular volume percentage in the septum, this is likely nonspecific. With minimal to no LV scarring and prominent septal-lateral dyssynchrony, this patient would likely be good CRT candidate. Dalton Mclean Electronically Signed   By: Loralie Champagne M.D.   On: 12/17/2021 15:31   ECHOCARDIOGRAM COMPLETE  Result Date: 12/14/2021    ECHOCARDIOGRAM REPORT   Patient Name:   [name] [facility] Date of Exam: 12/14/2021 Medical Rec #:  [id number]   Height:       66.0 in Accession #:    [id number]  Weight:       208.3 lb Date of Birth:  09-28-1961  BSA:          2.035 m Patient Age:    14 years    BP:           124/87 mmHg Patient Gender: F           HR:  83 bpm. Exam Location:  Inpatient Procedure: 2D Echo Indications:    Stroke  History:        Patient has prior history of Echocardiogram examinations, most                 recent 06/09/2018. CHF and Cardiomyopathy; Risk                 Factors:Hypertension.  Sonographer:    Arlyss Gandy Referring Phys: [id number] [place]  1. No left ventricular thrombus is seen, but apex is suboptimally seen. If embolic stroke is suspected, suggest repeat imaging with Definity contrast. Left ventricular ejection fraction, by estimation, is 20 to 25%. The left ventricle has severely decreased function. The left ventricle demonstrates global hypokinesis. The left ventricular internal cavity size was mildly dilated. Indeterminate diastolic filling due to E-A fusion. Although  there is global hypokinesis, the inferior wall and the inferior septum appear disproportionately hypokinetic/nearly akinetic. There is also profound LBBB-related sepatl-lateral dyssynchrony due to LBBB.  2. Right ventricular systolic function is normal. The right ventricular size is normal. There is normal pulmonary artery systolic pressure.  3. Left atrial size was mild to moderately dilated.  4. The mitral valve is normal in structure. Trivial mitral valve regurgitation.  5. The aortic valve is tricuspid. Aortic valve regurgitation is not visualized. No aortic stenosis is present.  6. The inferior vena cava is normal in size with greater than 50% respiratory variability, suggesting right atrial pressure of 3 mmHg. Comparison(s): Prior images reviewed side by side. The left ventricular function is significantly worse. Conclusion(s)/Recommendation(s): No left ventricular thrombus is seen, but if embolic stroke is suspected, suggest repeat imaging with Definity contrast. FINDINGS  Left Ventricle: No left ventricular thrombus is seen, but apex is suboptimally seen. If embolic stroke is suspected, suggest repeat imaging with Definity contrast. Left ventricular ejection fraction, by estimation, is 20 to 25%. The left ventricle has severely decreased function. The left ventricle demonstrates global hypokinesis. The left ventricular internal cavity size was mildly dilated. There is no left ventricular hypertrophy. Abnormal (paradoxical) septal motion, consistent with left bundle branch block. Indeterminate diastolic filling due to E-A fusion.  LV Wall Scoring: The inferior wall, mid inferoseptal segment, and basal inferoseptal segment are akinetic. The entire anterior wall, entire lateral wall, entire anterior septum, and entire apex are hypokinetic. Although there is global hypokinesis, the inferior wall and the inferior septum appear disproportionately hypokinetic/nearly akinetic. There is also profound LBBB-related  sepatl-lateral dyssynchrony due to LBBB. Right Ventricle: The right ventricular size is normal. No increase in right ventricular wall thickness. Right ventricular systolic function is normal. There is normal pulmonary artery systolic pressure. The tricuspid regurgitant velocity is 2.29 m/s, and  with an assumed right atrial pressure of 3 mmHg, the estimated right ventricular systolic pressure is 51.0 mmHg. Left Atrium: Left atrial size was mild to moderately dilated. Right Atrium: Right atrial size was normal in size. Pericardium: There is no evidence of pericardial effusion. Mitral Valve: The mitral valve is normal in structure. Trivial mitral valve regurgitation. Tricuspid Valve: The tricuspid valve is normal in structure. Tricuspid valve regurgitation is trivial. Aortic Valve: The aortic valve is tricuspid. Aortic valve regurgitation is not visualized. No aortic stenosis is present. Aortic valve mean gradient measures 4.0 mmHg. Aortic valve peak gradient measures 8.1 mmHg. Aortic valve area, by VTI measures 1.71 cm. Pulmonic Valve: The pulmonic valve was normal in structure. Pulmonic valve regurgitation is not visualized. Aorta: The aortic root is  normal in size and structure. Venous: The inferior vena cava is normal in size with greater than 50% respiratory variability, suggesting right atrial pressure of 3 mmHg. IAS/Shunts: No atrial level shunt detected by color flow Doppler.  LEFT VENTRICLE PLAX 2D LVIDd:         5.60 cm   Diastology LVIDs:         5.30 cm   LV e' medial:   7.07 cm/s LV PW:         1.00 cm   LV E/e' medial: 18.2 LV IVS:        1.00 cm LVOT diam:     2.00 cm LV SV:         42 LV SV Index:   21 LVOT Area:     3.14 cm  RIGHT VENTRICLE             IVC RV Basal diam:  3.50 cm     IVC diam: 1.60 cm RV Mid diam:    2.70 cm RV S prime:     14.00 cm/s TAPSE (M-mode): 2.0 cm LEFT ATRIUM             Index        RIGHT ATRIUM           Index LA diam:        3.10 cm 1.52 cm/m   RA Area:     14.70 cm  LA Vol (A2C):   75.8 ml 37.25 ml/m  RA Volume:   36.20 ml  17.79 ml/m LA Vol (A4C):   64.7 ml 31.80 ml/m LA Biplane Vol: 70.5 ml 34.65 ml/m  AORTIC VALVE AV Area (Vmax):    1.59 cm AV Area (Vmean):   1.49 cm AV Area (VTI):     1.71 cm AV Vmax:           142.00 cm/s AV Vmean:          100.000 cm/s AV VTI:            0.248 m AV Peak Grad:      8.1 mmHg AV Mean Grad:      4.0 mmHg LVOT Vmax:         71.80 cm/s LVOT Vmean:        47.500 cm/s LVOT VTI:          0.135 m LVOT/AV VTI ratio: 0.54  AORTA Ao Root diam: 3.20 cm Ao Asc diam:  3.30 cm MITRAL VALVE                TRICUSPID VALVE MV Area (PHT): 7.09 cm     TR Peak grad:   21.0 mmHg MV Decel Time: 107 msec     TR Vmax:        229.00 cm/s MV E velocity: 129.00 cm/s                             SHUNTS                             Systemic VTI:  0.14 m                             Systemic Diam: 2.00 cm Dani Gobble Croitoru MD Electronically signed by Sanda Klein MD Signature Date/Time: 12/14/2021/12:53:35 PM    Final    CT  HEAD CODE STROKE WO CONTRAST  Result Date: 12/13/2021 CLINICAL DATA:  Code stroke.  Neuro deficit, acute, stroke suspected EXAM: CT HEAD WITHOUT CONTRAST TECHNIQUE: Contiguous axial images were obtained from the base of the skull through the vertex without intravenous contrast. RADIATION DOSE REDUCTION: This exam was performed according to the departmental dose-optimization program which includes automated exposure control, adjustment of the mA and/or kV according to patient size and/or use of iterative reconstruction technique. COMPARISON:  None. FINDINGS: Brain: No evidence of acute large vascular territory infarction, hemorrhage, hydrocephalus, extra-axial collection or mass lesion/mass effect. Small age indeterminate infarct in the left corona radiata. Remote appearing infarct in the left basal ganglia. Additional patchy white matter hypoattenuation, age advanced. Vascular: No hyperdense vessel identified. Skull: No acute fracture.  Sinuses/Orbits: Inferior maxillary sinus mucosal thickening with retention cyst versus polyp in the right maxillary sinus. Unremarkable orbits. Other: No mastoid effusions. IMPRESSION: 1. Age indeterminate small lacunar infarct in the left corona radiata. 2. No evidence of acute large vascular territory infarct or acute hemorrhage. 3. Age advanced white matter hypoattenuation, nonspecific but potentially related to chronic microvascular disease or demyelination. MRI with contrast could further evaluate if clinically indicated. Findings discussed with Dr. Leonel Ramsay via telephone at 3:22 p.m. Electronically Signed   By: Margaretha Sheffield M.D.   On: 12/13/2021 15:25   VAS [place] LOWER EXTREMITY VENOUS (DVT)  Result Date: 12/18/2021  Lower Venous DVT Study Patient Name:  [name] [facility]  Date of Exam:   12/18/2021 Medical Rec #: [id number]    Accession #:    [id number] Date of Birth: 1961/10/23   Patient Gender: F Patient Age:   34 years Exam Location:  [facility] Procedure:      VAS [place] LOWER EXTREMITY VENOUS (DVT) Referring Phys: Elwin Sleight DE LA TORRE --------------------------------------------------------------------------------  Indications: Left lower extremity pain.  Comparison Study: No prior study Performing Technologist: Maudry Mayhew MHA, RDMS, RVT, RDCS  Examination Guidelines: A complete evaluation includes B-mode imaging, spectral Doppler, color Doppler, and power Doppler as needed of all accessible portions of each vessel. Bilateral testing is considered an integral part of a complete examination. Limited examinations for reoccurring indications may be performed as noted. The reflux portion of the exam is performed with the patient in reverse Trendelenburg.  +---------+---------------+---------+-----------+----------+--------------+  RIGHT     Compressibility Phasicity Spontaneity Properties Thrombus Aging  +---------+---------------+---------+-----------+----------+--------------+  CFV       Full             Yes       Yes                                    +---------+---------------+---------+-----------+----------+--------------+  SFJ       Full                                                             +---------+---------------+---------+-----------+----------+--------------+  FV Prox   Full                                                             +---------+---------------+---------+-----------+----------+--------------+  FV Mid    Full                                                             +---------+---------------+---------+-----------+----------+--------------+  FV Distal Full                                                             +---------+---------------+---------+-----------+----------+--------------+  PFV       Full                                                             +---------+---------------+---------+-----------+----------+--------------+  POP       Full            Yes       Yes                                    +---------+---------------+---------+-----------+----------+--------------+  PTV       Full                                                             +---------+---------------+---------+-----------+----------+--------------+  PERO      Full                                                             +---------+---------------+---------+-----------+----------+--------------+   +---------+---------------+---------+-----------+----------+--------------+  LEFT      Compressibility Phasicity Spontaneity Properties Thrombus Aging  +---------+---------------+---------+-----------+----------+--------------+  CFV       Full            Yes       Yes                                    +---------+---------------+---------+-----------+----------+--------------+  SFJ       Full                                                             +---------+---------------+---------+-----------+----------+--------------+  FV Prox   Full                                                              +---------+---------------+---------+-----------+----------+--------------+  FV Mid    Full                                                             +---------+---------------+---------+-----------+----------+--------------+  FV Distal Full                                                             +---------+---------------+---------+-----------+----------+--------------+  PFV       Full                                                             +---------+---------------+---------+-----------+----------+--------------+  POP       Full            Yes       Yes                                    +---------+---------------+---------+-----------+----------+--------------+  PTV       Full                                                             +---------+---------------+---------+-----------+----------+--------------+  PERO      Full                                                             +---------+---------------+---------+-----------+----------+--------------+     Summary: RIGHT: - There is no evidence of deep vein thrombosis in the lower extremity.  - No cystic structure found in the popliteal fossa.  LEFT: - There is no evidence of deep vein thrombosis in the lower extremity.  - No cystic structure found in the popliteal fossa.  *See table(s) above for measurements and observations. Electronically signed by Deitra Mayo MD on 12/18/2021 at 4:11:22 PM.    Final    ECHOCARDIOGRAM LIMITED  Result Date: 12/14/2021    ECHOCARDIOGRAM LIMITED REPORT   Patient Name:   [name] [facility] Date of Exam: 12/14/2021 Medical Rec #:  [id number]   Height:       66.0 in Accession #:    [id number]  Weight:       208.3 lb Date of Birth:  03-Aug-1961  BSA:          2.035 m Patient Age:    29 years    BP:           128/93 mmHg Patient Gender: F           HR:  90 bpm. Exam Location:  Inpatient Procedure: 2D Echo, Limited Echo and Intracardiac Opacification Agent Indications:    Stroke  History:         Patient has prior history of Echocardiogram examinations. CHF,                 Signs/Symptoms:Chest Pain and Dyspnea; Risk                 Factors:Hypertension.  Sonographer:    Jyl Heinz Referring Phys: [id number] Rosalin Hawking                  Limited additional images with Definity contrast.                 The apex is now well visualized. There is no evidence of left                 ventricular thrombus.                 Otherwise similar findings to the earlier study.   LV Volumes (MOD) LV vol d, MOD A2C: 196.0 ml LV vol d, MOD A4C: 206.0 ml LV vol s, MOD A2C: 148.0 ml LV vol s, MOD A4C: 159.0 ml LV SV MOD A2C:     48.0 ml LV SV MOD A4C:     206.0 ml LV SV MOD BP:      48.9 ml Mihai Croitoru MD Electronically signed by Sanda Klein MD Signature Date/Time: 12/14/2021/4:14:22 PM    Final       HISTORY OF PRESENT ILLNESS Patient with a history of HTN, CHF, obesity and cardiomyopathy presented with acute onset right facial droop and speech disturbances.  She was incidentally COVID-19 positive.   HOSPITAL COURSE Upon admission, TNK was given for patient's stroke symptoms with improvement noted.  MRI revealed small acute left MCA infarcts in the frontoparietal region.  Patient was seen by cardiology for CHF, as her EF was 20-25%.  She underwent cardiac MRI which demonstrated no cardiac source of embolus.  Patient developed right arm and left leg pain with slight swelling in the left leg.  Lower extremity ultrasound was negative for DVT.  She was treated with nonsteroidal anti-inflammatory drugs and showed some improvement.  She was advised to follow-up with her primary care physician for further evaluation and treatment of her left foot and right arm pain.  Patient participated in the sleep smart study and signed informed consent form but did not screen positive for sleep apnea on the overnight NOx 3 monitor and was a screen failure    Stroke: left MCA infarct, embolic pattern, likely due to cardiomyopathy with  low EF Code Stroke Age indeterminate small lacunar infarct in the left corona radiata. Age advanced white matter hypoattenuation, nonspecific but potentially related to chronic microvascular disease or demyelination. MRI with contrast could further evaluate if clinically indicated. MRI small acute left frontoparietal MCA branch infarct.  Moderate changes of small vessel disease.  Numerous chronic microhemorrhages. MRA brain and neck no large vessel stenosis or occlusion.   2D Echo 20 to 25%. The left ventricle has severely decreased function. The left ventricle demonstrates global hypokinesis.  2D echo with contrast no definite LV clot LDL 101 HgbA1c 5.7 VTE prophylaxis - SCDs No antithrombotic prior to admission, now on Aspirin 325mg  daily  Therapy recommendations: No PT follow-up  disposition: Home CHF History of CHF following with cardiology Home medication including Entresto, Coreg, spironolactone EF 20 to 25% Repeat 2D echo  with contrast showed no LV thrombus Gradually resume home meds after 24 hours of permissive hypertension Entresto now on hold r/t Cr, increased BB - cardiology  Cardiac MRI revels no thrombus.   Hypertension Home meds: carvedilol, amlodipine, spironolactone, Entresto Stable Restarting BP meds after 24 hours of permissive hypertension Coreg 12.5mg  resumed Long-term BP goal normotensive   Hyperlipidemia Home meds:  Atorvastatin 40mg ,  Increased to to Atorvastatin 80mg  LDL 101, goal < 70 Continue statin at discharge   Other Stroke Risk Factors Obesity, Body mass index is 33.38 kg/m., BMI >/= 30 associated with increased stroke risk, recommend weight loss, diet and exercise as appropriate  Hx stroke/TIA Age indeterminate small lacunar infarct in the left corona radiata Congestive heart failure Spironolactone Keep patient euvolemic   Other Active Problems Cardiomyopathy- Dr. Percival Spanish patient AKI BUN/Creatinine 37/1.98-> 37/1.96 -> 42/2.35 GFR  29 Entresto d/c'd COVID positive Incidentally COVID positive, asymptomatic no treatment indicated  RN Pressure Injury Documentation:     DISCHARGE EXAM Blood pressure 132/89, pulse 92, temperature 98.2 F (36.8 C), temperature source Oral, resp. rate 19, height 5\' 6"  (1.676 m), weight 93.8 kg, SpO2 100 %. General:  Alert, obese patient in no acute distress   NEURO:  Mental Status: AA&Ox3  Speech/Language: speech is without dysarthria or aphasia.   Cranial Nerves:  II: PERRL. Visual fields full.  III, IV, VI: EOMI. Eyelids elevate symmetrically.  V: Sensation is intact to light touch and symmetrical to face.  VII: Smile is symmetrical.   VIII: hearing intact to voice. IX, X:  Phonation is normal.  XII: tongue is midline without fasciculations. Motor: 5/5 strength to all muscle groups tested. Range of motion in right arm limited secondary to pain Sensation- Intact to light touch bilaterally. Coordination: FTN intact on left.  No drift.  Gait- deferred   Discharge Diet       Diet   Diet Heart Room service appropriate? Yes; Fluid consistency: Thin   liquids  DISCHARGE PLAN Disposition:  to home aspirin 81 mg daily and clopidogrel 75 mg daily for secondary stroke prevention for 3 weeks then aspirin alone. Ongoing stroke risk factor control by Primary Care Physician at time of discharge Follow-up PCP Velna Hatchet, MD in 2 weeks. Follow-up in [facility] in 8 weeks, office to schedule an appointment.  Follow-up with primary care physician for further evaluation and treatment for left foot and right foot pain  33minutes were spent preparing discharge.  [place] , MSN, AGACNP-BC [facility] See Amion for schedule and pager information 12/18/2021 4:31 PM   I have personally obtained history,examined this patient, reviewed notes, independently viewed imaging studies, participated in medical decision making and plan  of care.ROS completed by me personally and pertinent positives fully documented  I have made any additions or clarifications directly to the above note. Agree with note above.    Antony Contras, MD Medical Director [facility] Pager: [phone] 12/18/2021 4:35 PM

## 2021-12-18 NOTE — Discharge Instructions (Signed)
[name], you were admitted with left sided facial droop and speech difficulties which were caused by a stroke.  TNK was administered in the ED to treat this, and your symptoms improved.  You were seen by the cardiology team, and your medications for CHF were changed.  MRI and echocardiogram of your heart showed no source of blood clots.  You did develop some right arm and left leg pain, but an ultrasound of your left leg revealed no blood clots there.   You will be discharged home and need to follow up in the stroke clinic in 6-8 weeks.  You will take 81 mg of aspirin daily indefinitely and plavix for three weeks.

## 2021-12-18 NOTE — Progress Notes (Signed)
F/u appt scheduled per MD request, appt info placed on AVS.

## 2021-12-19 ENCOUNTER — Telehealth: Payer: Self-pay | Admitting: Cardiology

## 2021-12-19 ENCOUNTER — Telehealth: Payer: Self-pay

## 2021-12-19 NOTE — Telephone Encounter (Signed)
We received a phone call from the patients daughter this afternoon. The patient was seen in the ED by Dr Leonie Man and prior to discharge, they had asked the nurse about obtaining a letter excusing the patient from work until January 02, 2022. The nurse advised that they needed to call our office to get that note since the patient was seen by Dr Leonie Man in the ED.  Patient has reached out to PCP regarding this and left a vm with them regarding this.  Can you please advise if this is something you can provide, and if not, where the patient can go to get this note?  Thanks!

## 2021-12-19 NOTE — Telephone Encounter (Signed)
Spoke with patient who was recently in the hospital for a "stroke". 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 I recommended she contact her neurologist for the letter.

## 2021-12-19 NOTE — Telephone Encounter (Signed)
Patient's daughter is calling to talk with Dr. Charlesetta Shanks or nurse

## 2021-12-20 ENCOUNTER — Encounter: Payer: Self-pay | Admitting: *Deleted

## 2021-12-20 NOTE — Telephone Encounter (Signed)
I called the patient back. The letter is complete. She wanted it mailed to her home address. It will go out today.  Also, she needed a 6-8 week hospital follow up. She has been scheduled on 02/18/22.

## 2021-12-20 NOTE — Telephone Encounter (Signed)
Spoke with patient to inform her she is cleared by neurologist to go back to work by March 1. Today, she reports having trouble with her left leg, that it is weak and it took her 40 minutes to go up about 12 steps to her apartment.

## 2021-12-20 NOTE — Telephone Encounter (Signed)
I spoke to the patient. No change in the use of her leg since leaving the hospital. She has an appt with her PCP at 10am on 12/21/21. She has a pending appt w/ Dr. Leonie Man for her 6-8 week post-hospital follow up on 02/18/22. The letter is being mailed to her home address today.

## 2021-12-27 NOTE — Progress Notes (Signed)
Cardiology Office Note   Date:  12/28/2021   ID:  [name], DOB 28-Aug-1961, MRN [id number]  PCP:  Velna Hatchet, MD  Cardiologist:   Minus Breeding, MD   Chief Complaint  Patient presents with   Cardiomyopathy      History of Present Illness: [name] is a 61 y.o. female who presents for follow up of hypertensive cardiomyopathy.  Her EF was at one point 30% but two years ago it was up to 50 - 55%.     I last talked to her on virtual visit in 2020.    She was in the hospital last week.  She had a CVA.   There was an acute left MCA infarct.  EF was now found to be 20 - 25%.  MRI demonstrated no source of clot.   There was no evidence of previous MI or amyloid.  She had a negative screen for sleep apnea.  [name] was held secondary to AKI.  She had beta blocker titrated.  She was Covid positive.  She had right leg and left arm pain of unclear etiology.   She has had bilateral hip pain and may be diagnosed with arthritis and is getting physical therapy ordered.  She is not having any chest pressure, neck or arm discomfort.  She is not having any new shortness of breath, PND or orthopnea.  She is not having any palpitations, presyncope or syncope.  She has had some very mild edema.  Is not having any weight gain.   Past Medical History:  Diagnosis Date   CHF (congestive heart failure) (HCC)    Hypertension    Overweight(278.02)    Sciatica    Secondary cardiomyopathy, unspecified    EF now 50%    Past Surgical History:  Procedure Laterality Date   ABDOMINAL HYSTERECTOMY       Current Outpatient Medications  Medication Sig Dispense Refill   acetaminophen (TYLENOL) 325 MG tablet Take 2 tablets (650 mg total) by mouth every 4 (four) hours as needed for headache or mild pain.     aspirin EC 81 MG EC tablet Take 1 tablet (81 mg total) by mouth daily. Swallow whole. 30 tablet 11   atorvastatin (LIPITOR) 80 MG tablet Take 1 tablet (80 mg total) by mouth daily. 30 tablet 1    carvedilol (COREG) 6.25 MG tablet Take 3 tablets (18.75 mg total) by mouth 2 (two) times daily with a meal. 180 tablet 1   clopidogrel (PLAVIX) 75 MG tablet Take 1 tablet (75 mg total) by mouth daily. 21 tablet 0   HYDROcodone-acetaminophen (NORCO/VICODIN) 5-325 MG tablet Take 1 tablet by mouth every 6 (six) hours as needed for moderate pain.     isosorbide mononitrate (IMDUR) 30 MG 24 hr tablet Take 1 tablet (30 mg total) by mouth daily. 30 tablet 1   spironolactone (ALDACTONE) 25 MG tablet Take 25 mg by mouth daily.     zolpidem (AMBIEN) 5 MG tablet Take 5 mg by mouth at bedtime as needed for sleep.  1   hydrALAZINE (APRESOLINE) 25 MG tablet Take 1 tablet (25 mg total) by mouth 3 (three) times daily. 90 tablet 3   No current facility-administered medications for this visit.    Allergies:   Latex and Chlorhexidine gluconate    ROS:  Please see the history of present illness.   Otherwise, review of systems are positive for none.   All other systems are reviewed and negative.  PHYSICAL EXAM: VS:  BP 118/82    Pulse 84    Ht 5\' 6"  (1.676 m)    Wt 205 lb 6.4 oz (93.2 kg)    SpO2 95%    BMI 33.15 kg/m  , BMI Body mass index is 33.15 kg/m. GENERAL:  Well appearing NECK:  No jugular venous distention, waveform within normal limits, carotid upstroke brisk and symmetric, no bruits, no thyromegaly LUNGS:  Clear to auscultation bilaterally CHEST:  Unremarkable HEART:  PMI not displaced or sustained,S1 and S2 within normal limits, no S3, no S4, no clicks, no rubs, no murmurs ABD:  Flat, positive bowel sounds normal in frequency in pitch, no bruits, no rebound, no guarding, no midline pulsatile mass, no hepatomegaly, no splenomegaly EXT:  2 plus pulses throughout, no edema, no cyanosis no clubbing   EKG:  EKG is  ordered today. The ekg ordered today demonstrates sinus rhythm, rate 84, left bundle branch block   Recent Labs: 12/14/2021: ALT 13 12/18/2021: BUN 44; Creatinine, Ser 2.28;  Hemoglobin 10.3; Platelets 224; Potassium 4.0; Sodium 137    Lipid Panel    Component Value Date/Time   CHOL 172 12/14/2021 0354   TRIG 53 12/14/2021 0354   HDL 60 12/14/2021 0354   CHOLHDL 2.9 12/14/2021 0354   VLDL 11 12/14/2021 0354   LDLCALC 101 (H) 12/14/2021 0354      Wt Readings from Last 3 Encounters:  12/28/21 205 lb 6.4 oz (93.2 kg)  12/14/21 206 lb 12.7 oz (93.8 kg)  02/07/21 215 lb 9.6 oz (97.8 kg)      Other studies Reviewed: Additional studies/ records that were reviewed today include: Hospital records. Review of the above records demonstrates:  Please see elsewhere in the note.     ASSESSMENT AND PLAN:  NONISCHEMIC CARDIOMYOPATHY:   Today I am going to increase her hydralazine to 25 mg 3 times daily.  At the next visit we can consider going up on the Imdur, hydralazine or beta-blocker.  We could ultimately consider Wilder Glade and when she is on optimal medical therapy follow-up echocardiogram.   HTN:   This is being managed in the context of treating his CHF   CKD:   Her last creatinine was 1.98 at discharge.    DYSLIPIDEMIA:   LDL was 101.  Continue current therapy.   CVA: Follow-up per neurology and primary team.   Current medicines are reviewed at length with the patient today.  The patient does not have concerns regarding medicines.  The following changes have been made:     As above  Labs/ tests ordered today include: None  Orders Placed This Encounter  Procedures   EKG 12-Lead     Disposition:   FU with me after the APP.     Signed, Minus Breeding, MD  12/28/2021 10:32 AM    [facility]

## 2021-12-28 ENCOUNTER — Ambulatory Visit: Payer: Managed Care, Other (non HMO) | Admitting: Cardiology

## 2021-12-28 ENCOUNTER — Other Ambulatory Visit: Payer: Self-pay

## 2021-12-28 ENCOUNTER — Encounter: Payer: Self-pay | Admitting: Cardiology

## 2021-12-28 ENCOUNTER — Ambulatory Visit: Payer: Managed Care, Other (non HMO) | Admitting: General Practice

## 2021-12-28 VITALS — BP 118/82 | HR 84 | Ht 66.0 in | Wt 205.4 lb

## 2021-12-28 DIAGNOSIS — I10 Essential (primary) hypertension: Secondary | ICD-10-CM | POA: Diagnosis not present

## 2021-12-28 DIAGNOSIS — I428 Other cardiomyopathies: Secondary | ICD-10-CM

## 2021-12-28 DIAGNOSIS — N1832 Chronic kidney disease, stage 3b: Secondary | ICD-10-CM | POA: Diagnosis not present

## 2021-12-28 MED ORDER — HYDRALAZINE HCL 25 MG PO TABS: 25.0000 mg | ORAL_TABLET | Freq: Three times a day (TID) | ORAL | 3 refills | Status: DC

## 2021-12-28 NOTE — Patient Instructions (Signed)
Medication Instructions:  STOP AMLODIPINE  START: HYDRALAZINE 25mg  THREE TIMES DAILY  *If you need a refill on your cardiac medications before your next appointment, please call your pharmacy*  Follow-Up: At [facility], you and your health needs are our priority.  As part of our continuing mission to provide you with exceptional heart care, we have created designated Provider Care Teams.  These Care Teams include your primary Cardiologist (physician) and Advanced Practice Providers (APPs -  Physician Assistants and Nurse Practitioners) who all work together to provide you with the care you need, when you need it.  We recommend signing up for the patient portal called "MyChart".  Sign up information is provided on this After Visit Summary.  MyChart is used to connect with patients for Virtual Visits (Telemedicine).  Patients are able to view lab/test results, encounter notes, upcoming appointments, etc.  Non-urgent messages can be sent to your provider as well.   To learn more about what you can do with MyChart, go to [email or web address].    Your next appointment:   1 month(s)  The format for your next appointment:   In Person  Provider:   Coletta Memos, FNP

## 2022-01-28 NOTE — Progress Notes (Signed)
? ?Cardiology Office Note:   ? ?Date:  01/30/2022  ? ?ID:  [name], DOB 01-11-1961, MRN [id number] ? ?PCP:  Velna Hatchet, MD ?  ?[facility] Providers ?Cardiologist:  Minus Breeding, MD  ?   ? ?Referring MD: Velna Hatchet, MD  ? ?Follow-up for nonischemic cardiomyopathy ? ?History of Present Illness:   ? ?[name] is a 61 y.o. female with a hx of nonischemic cardiomyopathy, hypertension, stage IIIb CKD.  EF previously 30% follow-up echocardiogram showed improvement in EF to 50-55%.  She was seen virtually in 2020 by Dr. Percival Spanish.  She was admitted to the hospital 12/18/2021.  She followed up with Dr. Percival Spanish posthospitalization.  She was noted to have had a CVA.  There was an acute left in CVA infarct.  Her follow-up echocardiogram showed an LVEF of 20-25%.  Her MRI did not demonstrate a clot source.  There was no evidence of previous MI or amyloid.  She was negative for sleep apnea.  Her [name] was held due to AKI.  Her beta blocking medication was titrated.  She was COVID-positive.  She was also noted to have right leg and left arm pain of unclear etiology.  She denied chest pressure, arm and neck discomfort.  She was not having any shortness of breath PND orthopnea.  She denied palpitations presyncope and syncope.  She was noted to have very mild edema.  Her weight was stable.  Her hydralazine was increased to 25 mg 3 times daily.  Recommendation for going up on Imdur, hydralazine or beta-blocker at next visit.  There is also recommendation for ultimately considering Farxiga and optimizing medical therapy with plan for repeat echocardiogram. ? ?She presents to the clinic today for follow-up evaluation states she feels fairly well.  She has been trying to be more physically active.  She is able to do all of her ADLs, walk around her property/apartment area, and do grocery shopping.  She does note that with increased physical activity she does have increased work of breathing.  We reviewed her diet  and she eats fairly well.  She eats small portions.  Her blood pressure today is 134/88 with pulse of 93.  I will increase her carvedilol, give her the salty 6 diet sheet, have her increase her physical activity as tolerated, and plan follow-up for 1-2 months. ? ?Today she denies chest pain, shortness of breath, lower extremity edema, fatigue, palpitations, melena, hematuria, hemoptysis, diaphoresis, weakness, presyncope, syncope, orthopnea, and PND. ? ? ?Past Medical History:  ?Diagnosis Date  ? CHF (congestive heart failure) ([place])   ? Hypertension   ? Overweight(278.02)   ? Sciatica   ? Secondary cardiomyopathy, unspecified   ? EF now 50%  ? ? ?Past Surgical History:  ?Procedure Laterality Date  ? ABDOMINAL HYSTERECTOMY    ? ? ?Current Medications: ?Current Meds  ?Medication Sig  ? acetaminophen (TYLENOL) 325 MG tablet Take 2 tablets (650 mg total) by mouth every 4 (four) hours as needed for headache or mild pain.  ? aspirin EC 81 MG EC tablet Take 1 tablet (81 mg total) by mouth daily. Swallow whole.  ? atorvastatin (LIPITOR) 80 MG tablet Take 1 tablet (80 mg total) by mouth daily.  ? carvedilol (COREG) 6.25 MG tablet Take 3 tablets (18.75 mg total) by mouth 2 (two) times daily with a meal.  ? clopidogrel (PLAVIX) 75 MG tablet Take 1 tablet (75 mg total) by mouth daily.  ? hydrALAZINE (APRESOLINE) 25 MG tablet Take 1 tablet (  25 mg total) by mouth 3 (three) times daily.  ? HYDROcodone-acetaminophen (NORCO/VICODIN) 5-325 MG tablet Take 1 tablet by mouth every 6 (six) hours as needed for moderate pain.  ? isosorbide mononitrate (IMDUR) 30 MG 24 hr tablet Take 1 tablet (30 mg total) by mouth daily.  ? spironolactone (ALDACTONE) 25 MG tablet Take 25 mg by mouth daily.  ? zolpidem (AMBIEN) 5 MG tablet Take 5 mg by mouth at bedtime as needed for sleep.  ?  ? ?Allergies:   Latex and Chlorhexidine gluconate  ? ?Social History  ? ?Socioeconomic History  ? Marital status: Single  ?  Spouse name: Not on file  ? Number of  children: Not on file  ? Years of education: Not on file  ? Highest education level: Not on file  ?Occupational History  ? Not on file  ?Tobacco Use  ? Smoking status: Never  ? Smokeless tobacco: Never  ?Substance and Sexual Activity  ? Alcohol use: Yes  ?  Alcohol/week: 7.0 - 14.0 standard drinks  ?  Types: 7 - 14 Cans of beer per week  ?  Comment: social-daily beers  ? Drug use: No  ? Sexual activity: Yes  ?  Birth control/protection: Surgical  ?Other Topics Concern  ? Not on file  ?Social History Narrative  ? Not on file  ? ?Social Determinants of Health  ? ?Financial Resource Strain: Not on file  ?Food Insecurity: Not on file  ?Transportation Needs: Not on file  ?Physical Activity: Not on file  ?Stress: Not on file  ?Social Connections: Not on file  ?  ? ?Family History: ?The patient's family history includes Cerebral aneurysm (age of onset: 52) in her mother; Coronary artery disease in an other family member; Heart attack (age of onset: 53) in her father; Hypertension in her brother and mother. There is no history of Colon cancer or Breast cancer. ? ?ROS:   ?Please see the history of present illness.    ? All other systems reviewed and are negative. ? ? ?Risk Assessment/Calculations:   ?  ? ?    ? ?Physical Exam:   ? ?VS:  BP 134/88   Pulse 93   Ht '5\' 6"'$  (1.676 m)   Wt 204 lb (92.5 kg)   SpO2 97%   BMI 32.93 kg/m?    ? ?Wt Readings from Last 3 Encounters:  ?01/30/22 204 lb (92.5 kg)  ?12/28/21 205 lb 6.4 oz (93.2 kg)  ?12/14/21 206 lb 12.7 oz (93.8 kg)  ?  ? ?GEN:  Well nourished, well developed in no acute distress ?HEENT: Normal ?NECK: No JVD; No carotid bruits ?LYMPHATICS: No lymphadenopathy ?CARDIAC: RRR, no murmurs, rubs, gallops ?RESPIRATORY:  Clear to auscultation without rales, wheezing or rhonchi  ?ABDOMEN: Soft, non-tender, non-distended ?MUSCULOSKELETAL:  No edema; No deformity  ?SKIN: Warm and dry ?NEUROLOGIC:  Alert and oriented x 3 ?PSYCHIATRIC:  Normal affect  ? ? ?EKGs/Labs/Other Studies  Reviewed:   ? ?The following studies were reviewed today: ? ?Echocardiogram 12/14/2021 ?IMPRESSIONS  ? ? ? 1. No left ventricular thrombus is seen, but apex is suboptimally seen.  ?If embolic stroke is suspected, suggest repeat imaging with Definity  ?contrast. Left ventricular ejection fraction, by estimation, is 20 to 25%.  ?The left ventricle has severely  ?decreased function. The left ventricle demonstrates global hypokinesis.  ?The left ventricular internal cavity size was mildly dilated.  ?Indeterminate diastolic filling due to E-A fusion. Although there is  ?global hypokinesis, the inferior wall and the  ?  inferior septum appear disproportionately hypokinetic/nearly akinetic.  ?There is also profound LBBB-related sepatl-lateral dyssynchrony due to  ?LBBB.  ? 2. Right ventricular systolic function is normal. The right ventricular  ?size is normal. There is normal pulmonary artery systolic pressure.  ? 3. Left atrial size was mild to moderately dilated.  ? 4. The mitral valve is normal in structure. Trivial mitral valve  ?regurgitation.  ? 5. The aortic valve is tricuspid. Aortic valve regurgitation is not  ?visualized. No aortic stenosis is present.  ? 6. The inferior vena cava is normal in size with greater than 50%  ?respiratory variability, suggesting right atrial pressure of 3 mmHg.  ? ?Comparison(s): Prior images reviewed side by side. The left ventricular  ?function is significantly worse.  ? ?Echocardiogram 12/14/2021 ? Limited additional images with Definity contrast.  ?                The apex is now well visualized. There is no evidence of  ?left  ?                ventricular thrombus.  ?                Otherwise similar findings to the earlier study.  ? ?EKG: None today. ? ?Recent Labs: ?12/14/2021: ALT 13 ?12/18/2021: BUN 44; Creatinine, Ser 2.28; Hemoglobin 10.3; Platelets 224; Potassium 4.0; Sodium 137  ?Recent Lipid Panel ?   ?Component Value Date/Time  ? CHOL 172 12/14/2021 0354  ? TRIG 53  12/14/2021 0354  ? HDL 60 12/14/2021 0354  ? CHOLHDL 2.9 12/14/2021 0354  ? VLDL 11 12/14/2021 0354  ? Andrew 101 (H) 12/14/2021 0354  ? ? ?ASSESSMENT & PLAN  ? ? ?Nonischemic cardiomyopathy-no increased DOE or [place]

## 2022-01-30 ENCOUNTER — Encounter (HOSPITAL_BASED_OUTPATIENT_CLINIC_OR_DEPARTMENT_OTHER): Payer: Self-pay | Admitting: General Practice

## 2022-01-30 ENCOUNTER — Ambulatory Visit (INDEPENDENT_AMBULATORY_CARE_PROVIDER_SITE_OTHER): Payer: Managed Care, Other (non HMO) | Admitting: General Practice

## 2022-01-30 VITALS — BP 134/88 | HR 93 | Ht 66.0 in | Wt 204.0 lb

## 2022-01-30 DIAGNOSIS — I428 Other cardiomyopathies: Secondary | ICD-10-CM | POA: Diagnosis not present

## 2022-01-30 DIAGNOSIS — E785 Hyperlipidemia, unspecified: Secondary | ICD-10-CM | POA: Diagnosis not present

## 2022-01-30 DIAGNOSIS — N1832 Chronic kidney disease, stage 3b: Secondary | ICD-10-CM | POA: Diagnosis not present

## 2022-01-30 DIAGNOSIS — I10 Essential (primary) hypertension: Secondary | ICD-10-CM | POA: Diagnosis not present

## 2022-01-30 MED ORDER — CARVEDILOL 25 MG PO TABS: 25.0000 mg | ORAL_TABLET | Freq: Two times a day (BID) | ORAL | 3 refills | Status: DC

## 2022-01-30 MED ORDER — CLOPIDOGREL BISULFATE 75 MG PO TABS: 75.0000 mg | ORAL_TABLET | Freq: Every day | ORAL | 11 refills | Status: DC

## 2022-01-30 NOTE — Patient Instructions (Signed)
Medication Instructions:  ?Your physician has recommended you make the following change in your medication:  ? ?Start: Carvedilol '25mg'$  twice daily  ?*If you need a refill on your cardiac medications before your next appointment, please call your pharmacy* ? ?Follow-Up: ?At [facility], you and your health needs are our priority.  As part of our continuing mission to provide you with exceptional heart care, we have created designated Provider Care Teams.  These Care Teams include your primary Cardiologist (physician) and Advanced Practice Providers (APPs -  Physician Assistants and Nurse Practitioners) who all work together to provide you with the care you need, when you need it. ? ?We recommend signing up for the patient portal called "MyChart".  Sign up information is provided on this After Visit Summary.  MyChart is used to connect with patients for Virtual Visits (Telemedicine).  Patients are able to view lab/test results, encounter notes, upcoming appointments, etc.  Non-urgent messages can be sent to your provider as well.   ?To learn more about what you can do with MyChart, go to [email or web address].   ? ?Your next appointment:   ?1-2 month(s) ? ?The format for your next appointment:   ?In Person ? ?Provider:   ?Minus Breeding, MD  or Coletta Memos, NP { ? ?Other Instructions ?Exercise recommendations: ?The American Heart Association recommends 150 minutes of moderate intensity exercise weekly. ?Try 30 minutes of moderate intensity exercise 4-5 times per week. ?This could include walking, jogging, or swimming. ? ? ? ?

## 2022-02-13 MED FILL — Tenecteplase For IV Soln Kit 50 MG: INTRAVENOUS | Qty: 24 | Status: AC

## 2022-02-13 MED FILL — Tenecteplase For IV Soln Kit 50 MG: INTRAVENOUS | Qty: 26 | Status: AC

## 2022-02-18 ENCOUNTER — Ambulatory Visit: Payer: Managed Care, Other (non HMO) | Admitting: Neurology

## 2022-02-18 ENCOUNTER — Encounter: Payer: Self-pay | Admitting: Neurology

## 2022-02-18 VITALS — BP 133/91 | HR 86 | Ht 66.0 in | Wt 203.0 lb

## 2022-02-18 DIAGNOSIS — I639 Cerebral infarction, unspecified: Secondary | ICD-10-CM | POA: Diagnosis not present

## 2022-02-18 DIAGNOSIS — I255 Ischemic cardiomyopathy: Secondary | ICD-10-CM

## 2022-02-18 NOTE — Progress Notes (Signed)
?[facility] ?[address] ?[place]. [place] [address] ?[phone] ? ?     OFFICE FOLLOW-UP NOTE ? ?[name] ?Date of Birth:  August 18, 1961 ?Medical Record Number:  [id number]  ? ?HPI: [name] is a 61 year old African-American lady seen today for initial office visit following hospital admission for stroke in February 2023.  History is obtained from the patient and review of electronic medical records I personally reviewed pertinent available imaging films in PACS.  She has past medical history of congestive heart failure, hypertension, hyperlipidemia and obesity.  She presented on 12/13/2021 to the emergency room by EMS for evaluation of acute onset of right facial droop and speech disturbance.  She was at her workplace and talking to coworkers and symptoms developed suddenly.  Her blood pressure was in the 195K systolics in blood glucose was 114 mg percent.  She was given thrombolysis with IV TNK and she was not a candidate for thrombectomy as there was no LVO noted on the MR angiogram.  2D echo showed diminished ejection fraction of 20 to 25% with global hypokinesis but no definite clot.  LDL cholesterol was 101 mg percent and hemoglobin A1c was 5.7.  Cardiac MRI was performed to look for intracardiac thrombus but was negative.  Patient was placed on aspirin and Plavix for 3 weeks followed by aspirin alone but she was also screened for sleep smart study but tested negative on the NOx 3 monitor for sleep apnea.  Patient states she is done well since discharge facial droop resolved.  She has no residual weakness.  She occasionally gets short of breath when she tries to walk fast.  Otherwise she is doing well.  Blood pressure is well controlled today it is 131/91.  She is tolerating aspirin Plavix well without bleeding or bruising.  She remains on Lipitor without muscle aches and pains.  She has no therapy needs.  She has no complaints today.. ? ?ROS:   ?14 system review of systems is  positive for shortness of breath, speech difficulties all other systems negative ? ?PMH:  ?Past Medical History:  ?Diagnosis Date  ? CHF (congestive heart failure) ([place])   ? Hypertension   ? Overweight(278.02)   ? Sciatica   ? Secondary cardiomyopathy, unspecified   ? EF now 50%  ? ? ?Social History:  ?Social History  ? ?Socioeconomic History  ? Marital status: Single  ?  Spouse name: Not on file  ? Number of children: Not on file  ? Years of education: Not on file  ? Highest education level: Not on file  ?Occupational History  ? Not on file  ?Tobacco Use  ? Smoking status: Never  ? Smokeless tobacco: Never  ?Substance and Sexual Activity  ? Alcohol use: Yes  ?  Alcohol/week: 7.0 - 14.0 standard drinks  ?  Types: 7 - 14 Cans of beer per week  ?  Comment: social-daily beers  ? Drug use: No  ? Sexual activity: Yes  ?  Birth control/protection: Surgical  ?Other Topics Concern  ? Not on file  ?Social History Narrative  ? Not on file  ? ?Social Determinants of Health  ? ?Financial Resource Strain: Not on file  ?Food Insecurity: Not on file  ?Transportation Needs: Not on file  ?Physical Activity: Not on file  ?Stress: Not on file  ?Social Connections: Not on file  ?Intimate Partner Violence: Not on file  ? ? ?Medications:   ?Current Outpatient Medications on File Prior to Visit  ?  Medication Sig Dispense Refill  ? acetaminophen (TYLENOL) 325 MG tablet Take 2 tablets (650 mg total) by mouth every 4 (four) hours as needed for headache or mild pain.    ? aspirin EC 81 MG EC tablet Take 1 tablet (81 mg total) by mouth daily. Swallow whole. 30 tablet 11  ? atorvastatin (LIPITOR) 80 MG tablet Take 1 tablet (80 mg total) by mouth daily. 30 tablet 1  ? carvedilol (COREG) 25 MG tablet Take 1 tablet (25 mg total) by mouth 2 (two) times daily with a meal. 180 tablet 3  ? hydrALAZINE (APRESOLINE) 25 MG tablet Take 1 tablet (25 mg total) by mouth 3 (three) times daily. 90 tablet 3  ? HYDROcodone-acetaminophen (NORCO/VICODIN) 5-325 MG  tablet Take 1 tablet by mouth every 6 (six) hours as needed for moderate pain.    ? isosorbide mononitrate (IMDUR) 30 MG 24 hr tablet Take 1 tablet (30 mg total) by mouth daily. 30 tablet 1  ? spironolactone (ALDACTONE) 25 MG tablet Take 25 mg by mouth daily.    ? zolpidem (AMBIEN) 5 MG tablet Take 5 mg by mouth at bedtime as needed for sleep.  1  ? ?No current facility-administered medications on file prior to visit.  ? ? ?Allergies:   ?Allergies  ?Allergen Reactions  ? Latex Itching  ? Chlorhexidine Gluconate Hives  ?  Unknown if this is the cause of the hives, raised areas around lab draw sites. No tape and area wiped with chlorhexidine.  ? ? ?Physical Exam ?General: Obese middle-aged African-American lady seated, in no evident distress ?Head: head normocephalic and atraumatic.  ?Neck: supple with no carotid or supraclavicular bruits ?Cardiovascular: regular rate and rhythm, no murmurs ?Musculoskeletal: no deformity ?Skin:  no rash/petichiae ?Vascular:  Normal pulses all extremities ?Vitals:  ? 02/18/22 0825  ?BP: (!) 133/91  ?Pulse: 86  ? ?Neurologic Exam ?Mental Status: Awake and fully alert. Oriented to place and time. Recent and remote memory intact. Attention span, concentration and fund of knowledge appropriate. Mood and affect appropriate.  ?Cranial Nerves: Fundoscopic exam reveals sharp disc margins. Pupils equal, briskly reactive to light. Extraocular movements full without nystagmus. Visual fields full to confrontation. Hearing intact. Facial sensation intact. Face, tongue, palate moves normally and symmetrically.  ?Motor: Normal bulk and tone. Normal strength in all tested extremity muscles. ?Sensory.: intact to touch ,pinprick .position and vibratory sensation.  ?Coordination: Rapid alternating movements normal in all extremities. Finger-to-nose and heel-to-shin performed accurately bilaterally. ?Gait and Station: Arises from chair without difficulty. Stance is normal. Gait demonstrates normal stride  length and balance . Able to heel, toe and tandem walk without difficulty.  ?Reflexes: 1+ and symmetric. Toes downgoing.  ? ?NIHSS  0 ?Modified Rankin  1 ? ? ?ASSESSMENT: 61 year old African-American lady with left MCA branch infarcts in February 2023 of cryptogenic etiology in the setting of cardiomyopathy and COVID infection.  Vascular risk factors of  obesity,cardiomyopathy, hyperlipidemia, hypertension. ? ? ? ? ?PLAN: I had a long d/w patient and her daughter about her recent stroke, cardiomyopathy, risk for recurrent stroke/TIAs, personally independently reviewed imaging studies and stroke evaluation results and answered questions.Continue aspirin 81 mg daily alone and now discontinue Plavix for secondary stroke prevention and maintain strict control of hypertension with blood pressure goal below 130/90, diabetes with hemoglobin A1c goal below 6.5% and lipids with LDL cholesterol goal below 70 mg/dL. I also advised the patient to eat a healthy diet with plenty of whole grains, cereals, fruits and vegetables, exercise regularly and  maintain ideal body weight .continue close follow-up with cardiology for management of her congestive heart failure and cardiomyopathy.  Followup in the future with my nurse practitioner in 6 months or call earlier if necessary.Greater than 50% of time during this 35 minute visit was spent on counseling,explanation of diagnosis, planning of further management, discussion with patient and family and coordination of care ?Antony Contras, MD ?Note: This document was prepared with digital dictation and possible smart phrase technology. Any transcriptional errors that result from this process are unintentional ? ?

## 2022-02-18 NOTE — Patient Instructions (Signed)
I had a long d/w patient and her daughter about her recent stroke, cardiomyopathy, risk for recurrent stroke/TIAs, personally independently reviewed imaging studies and stroke evaluation results and answered questions.Continue aspirin 81 mg daily alone and now discontinue Plavix for secondary stroke prevention and maintain strict control of hypertension with blood pressure goal below 130/90, diabetes with hemoglobin A1c goal below 6.5% and lipids with LDL cholesterol goal below 70 mg/dL. I also advised the patient to eat a healthy diet with plenty of whole grains, cereals, fruits and vegetables, exercise regularly and maintain ideal body weight .continue close follow-up with cardiology for management of her congestive heart failure and cardiomyopathy.  Followup in the future with my nurse practitioner in 6 months or call earlier if necessary. ?Stroke Prevention ?Some medical conditions and behaviors can lead to a higher chance of having a stroke. You can help prevent a stroke by eating healthy, exercising, not smoking, and managing any medical conditions you have. ?Stroke is a leading cause of functional impairment. Primary prevention is particularly important because a majority of strokes are first-time events. Stroke changes the lives of not only those who experience a stroke but also their family and other caregivers. ?How can this condition affect me? ?A stroke is a medical emergency and should be treated right away. A stroke can lead to brain damage and can sometimes be life-threatening. If a person gets medical treatment right away, there is a better chance of surviving and recovering from a stroke. ?What can increase my risk? ?The following medical conditions may increase your risk of a stroke: ?Cardiovascular disease. ?High blood pressure (hypertension). ?Diabetes. ?High cholesterol. ?Sickle cell disease. ?Blood clotting disorders (hypercoagulable state). ?Obesity. ?Sleep disorders (obstructive sleep  apnea). ?Other risk factors include: ?Being older than age 68. ?Having a history of blood clots, stroke, or mini-stroke (transient ischemic attack, TIA). ?Genetic factors, such as race, ethnicity, or a family history of stroke. ?Smoking cigarettes or using other tobacco products. ?Taking birth control pills, especially if you also use tobacco. ?Heavy use of alcohol or drugs, especially cocaine and methamphetamine. ?Physical inactivity. ?What actions can I take to prevent this? ?Manage your health conditions ?High cholesterol levels. ?Eating a healthy diet is important for preventing high cholesterol. If cholesterol cannot be managed through diet alone, you may need to take medicines. ?Take any prescribed medicines to control your cholesterol as told by your health care provider. ?Hypertension. ?To reduce your risk of stroke, try to keep your blood pressure below 130/80. ?Eating a healthy diet and exercising regularly are important for controlling blood pressure. If these steps are not enough to manage your blood pressure, you may need to take medicines. ?Take any prescribed medicines to control hypertension as told by your health care provider. ?Ask your health care provider if you should monitor your blood pressure at home. ?Have your blood pressure checked every year, even if your blood pressure is normal. Blood pressure increases with age and some medical conditions. ?Diabetes. ?Eating a healthy diet and exercising regularly are important parts of managing your blood sugar (glucose). If your blood sugar cannot be managed through diet and exercise, you may need to take medicines. ?Take any prescribed medicines to control your diabetes as told by your health care provider. ?Get evaluated for obstructive sleep apnea. Talk to your health care provider about getting a sleep evaluation if you snore a lot or have excessive sleepiness. ?Make sure that any other medical conditions you have, such as atrial fibrillation or  atherosclerosis, are managed. ?  Nutrition ?Follow instructions from your health care provider about what to eat or drink to help manage your health condition. These instructions may include: ?Reducing your daily calorie intake. ?Limiting how much salt (sodium) you use to 1,500 milligrams (mg) each day. ?Using only healthy fats for cooking, such as olive oil, canola oil, or sunflower oil. ?Eating healthy foods. You can do this by: ?Choosing foods that are high in fiber, such as whole grains, and fresh fruits and vegetables. ?Eating at least 5 servings of fruits and vegetables a day. Try to fill one-half of your plate with fruits and vegetables at each meal. ?Choosing lean protein foods, such as lean cuts of meat, poultry without skin, fish, tofu, beans, and nuts. ?Eating low-fat dairy products. ?Avoiding foods that are high in sodium. This can help lower blood pressure. ?Avoiding foods that have saturated fat, trans fat, and cholesterol. This can help prevent high cholesterol. ?Avoiding processed and prepared foods. ?Counting your daily carbohydrate intake. ? ?Lifestyle ?If you drink alcohol: ?Limit how much you have to: ?0-1 drink a day for women who are not pregnant. ?0-2 drinks a day for men. ?Know how much alcohol is in your drink. In the [place] one drink equals one 12 oz bottle of beer (352m), one 5 oz glass of wine (1488m, or one 1? oz glass of hard liquor (4441m ?Do not use any products that contain nicotine or tobacco. These products include cigarettes, chewing tobacco, and vaping devices, such as e-cigarettes. If you need help quitting, ask your health care provider. ?Avoid secondhand smoke. ?Do not use drugs. ?Activity ? ?Try to stay at a healthy weight. ?Get at least 30 minutes of exercise on most days, such as: ?Fast walking. ?Biking. ?Swimming. ?Medicines ?Take over-the-counter and prescription medicines only as told by your health care provider. Aspirin or blood thinners (antiplatelets or  anticoagulants) may be recommended to reduce your risk of forming blood clots that can lead to stroke. ?Avoid taking birth control pills. Talk to your health care provider about the risks of taking birth control pills if: ?You are over 35 84ars old. ?You smoke. ?You get very bad headaches. ?You have had a blood clot. ?Where to find more information ?American Stroke Association: www.strokeassociation.org ?Get help right away if: ?You or a loved one has any symptoms of a stroke. "BE FAST" is an easy way to remember the main warning signs of a stroke: ?B - Balance. Signs are dizziness, sudden trouble walking, or loss of balance. ?E - Eyes. Signs are trouble seeing or a sudden change in vision. ?F - Face. Signs are sudden weakness or numbness of the face, or the face or eyelid drooping on one side. ?A - Arms. Signs are weakness or numbness in an arm. This happens suddenly and usually on one side of the body. ?S - Speech. Signs are sudden trouble speaking, slurred speech, or trouble understanding what people say. ?T - Time. Time to call emergency services. Write down what time symptoms started. ?You or a loved one has other signs of a stroke, such as: ?A sudden, severe headache with no known cause. ?Nausea or vomiting. ?Seizure. ?These symptoms may represent a serious problem that is an emergency. Do not wait to see if the symptoms will go away. Get medical help right away. Call your local emergency services (911 in the [place] Do not drive yourself to the hospital. ?Summary ?You can help to prevent a stroke by eating healthy, exercising, not smoking, limiting alcohol intake, and managing any  medical conditions you may have. ?Do not use any products that contain nicotine or tobacco. These include cigarettes, chewing tobacco, and vaping devices, such as e-cigarettes. If you need help quitting, ask your health care provider. ?Remember "BE FAST" for warning signs of a stroke. Get help right away if you or a loved one has any  of these signs. ?This information is not intended to replace advice given to you by your health care provider. Make sure you discuss any questions you have with your health care provider. ?Document Revised: 05/22/2020 D

## 2022-02-20 IMAGING — MR MR CARD MORPHOLOGY WO/W CM
45 of 48 series · 45 of 48 positions shown · IV contrast (Contrast agent)
Comparison: none

CLINICAL DATA: Cardiomyopathy,?LV thrombus

EXAM:
CARDIAC MRI
TECHNIQUE: The patient was scanned on a 1.5 Tesla GE magnet. A dedicated
cardiac coil was used. Functional imaging was done using Fiesta
sequences. [DATE], and 4 chamber views were done to assess for RWMA's.
Modified Chytre rule using a short axis stack was used to
calculate an ejection fraction on a dedicated work station using
Circle software. The patient received 10 cc of Gadavist. After 10
minutes inversion recovery sequences were used to assess for
infiltration and scar tissue.
CONTRAST:  Gadavist 10 cc

[Series 4: [id number]_bh · axial · 8.0mm · 1.48mm/px · 1 of 16 slices shown]
[im 1/16]
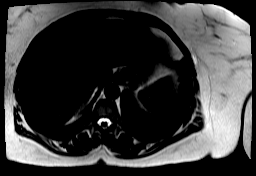

[Series 8: bSSFP · oblique · 8.0mm · 1.61mm/px · 1 of 25 slices shown (1 of 24)]
[im 1/25]
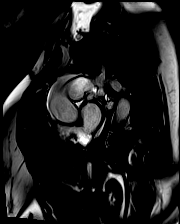

[Series 9: bSSFP · oblique · 8.0mm · 1.61mm/px · 1 of 25 slices shown (2 of 24)]
[im 1/25]
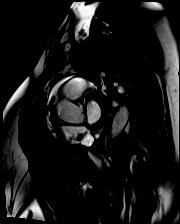

[Series 10: bSSFP · oblique · 8.0mm · 1.61mm/px · 1 of 25 slices shown (3 of 24)]
[im 1/25]
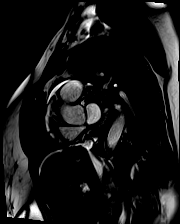

[Series 11: bSSFP · oblique · 8.0mm · 1.61mm/px · 1 of 25 slices shown (4 of 24)]
[im 1/25]
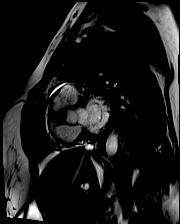

[Series 12: bSSFP · oblique · 8.0mm · 1.61mm/px · 1 of 25 slices shown (5 of 24)]
[im 1/25]
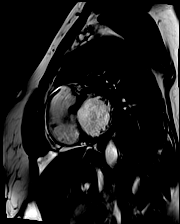

[Series 13: bSSFP · oblique · 8.0mm · 1.61mm/px · 1 of 25 slices shown (6 of 24)]
[im 1/25]
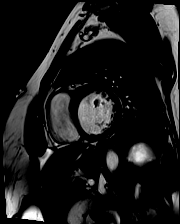

[Series 14: bSSFP · oblique · 8.0mm · 1.61mm/px · 1 of 25 slices shown (7 of 24)]
[im 1/25]
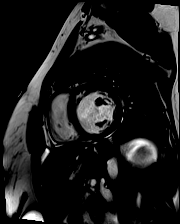

[Series 15: bSSFP · oblique · 8.0mm · 1.61mm/px · 1 of 25 slices shown (8 of 24)]
[im 1/25]
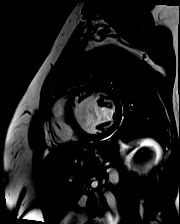

[Series 16: bSSFP · oblique · 8.0mm · 1.61mm/px · 1 of 25 slices shown (9 of 24)]
[im 1/25]
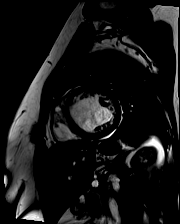

[Series 17: bSSFP · oblique · 8.0mm · 1.61mm/px · 1 of 25 slices shown (10 of 24)]
[im 1/25]
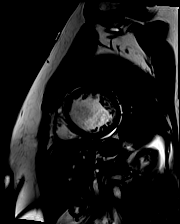

[Series 18: bSSFP · oblique · 8.0mm · 1.61mm/px · 1 of 25 slices shown (11 of 24)]
[im 1/25]
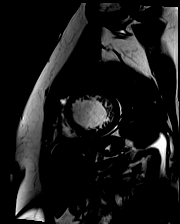

[Series 19: bSSFP · oblique · 8.0mm · 1.61mm/px · 1 of 25 slices shown (12 of 24)]
[im 1/25]
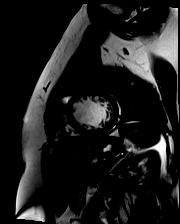

[Series 20: bSSFP · oblique · 8.0mm · 1.61mm/px · 1 of 25 slices shown (13 of 24)]
[im 1/25]
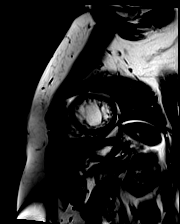

[Series 21: bSSFP · oblique · 8.0mm · 1.61mm/px · 1 of 25 slices shown (14 of 24)]
[im 1/25]
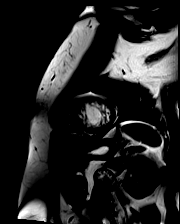

[Series 22: bSSFP · oblique · 8.0mm · 1.61mm/px · 1 of 25 slices shown (15 of 24)]
[im 1/25]
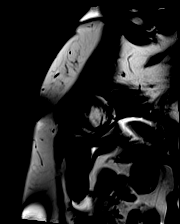

[Series 23: bSSFP · oblique · 8.0mm · 1.61mm/px · 1 of 25 slices shown (16 of 24)]
[im 1/25]
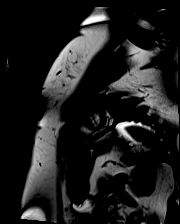

[Series 24: bSSFP · oblique · 8.0mm · 1.61mm/px · 1 of 25 slices shown (17 of 24)]
[im 1/25]
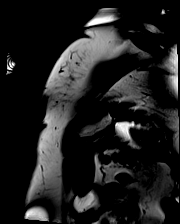

[Series 25: bSSFP · oblique · 8.0mm · 1.61mm/px · 1 of 25 slices shown (18 of 24)]
[im 1/25]
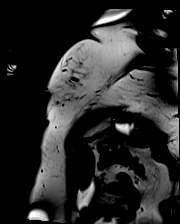

[Series 26: bSSFP · oblique · 6.0mm · 1.41mm/px · 1 of 25 slices shown (19 of 24)]
[im 1/25]
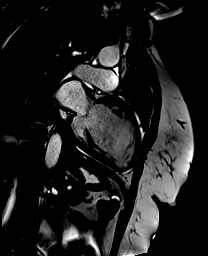

[Series 27: bSSFP · coronal · 6.0mm · 1.41mm/px · 1 of 25 slices shown (20 of 24)]
[im 1/25]
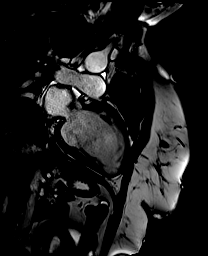

[Series 28: bSSFP · axial · 6.0mm · 1.41mm/px · 1 of 25 slices shown (21 of 24)]
[im 1/25]
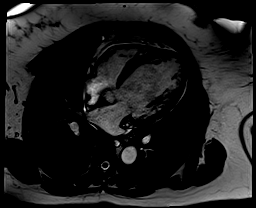

[Series 29: STIR · oblique · 8.0mm · 2.02mm/px · 1 of 17 slices shown]
[im 1/17]
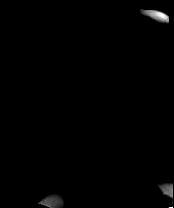

[Series 30: (id)_long_t1 · oblique · 8.0mm · 2.08mm/px · 1 of 24 slices shown]
[im 1/24]
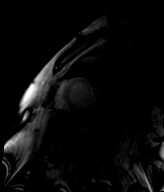

[Series 31: (id)_long_t1_moco · oblique · 8.0mm · 2.08mm/px · 1 of 21 slices shown]
[im 1/21]
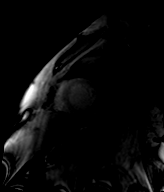

[Series 32: (id)_long_t1_moco_t1 · oblique · 8.0mm · 2.08mm/px · 1 of 6 slices shown]
[im 1/6]
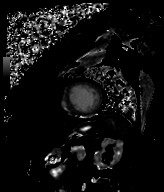

[Series 34: (id)_trufi · oblique · 8.0mm · 2.08mm/px · 1 of 9 slices shown]
[im 1/9]
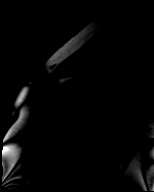

[Series 35: (id)_trufi_moco · oblique · 8.0mm · 2.08mm/px · 1 of 9 slices shown]
[im 1/9]
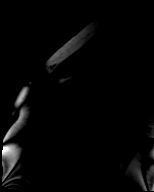

[Series 36: (id)_trufi_moco_t2 · oblique · 8.0mm · 2.08mm/px · 1 of 3 slices shown]
[im 1/3]
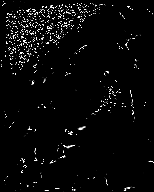

[Series 38: pre short axis · oblique · non-contrast · 8.0mm · 2.50mm/px · 1 of 10 slices shown (1 of 6)]
[im 1/10]
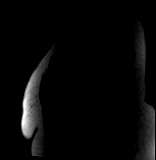

[Series 39: pre short axis · oblique · non-contrast · 8.0mm · 2.50mm/px · 1 of 10 slices shown (2 of 6)]
[im 1/10]
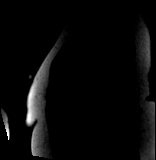

[Series 40: pre short axis · oblique · non-contrast · 8.0mm · 2.50mm/px · 1 of 10 slices shown (3 of 6)]
[im 1/10]
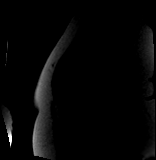

[Series 41: pre short axis · oblique · non-contrast · 8.0mm · 2.50mm/px · 1 of 10 slices shown (4 of 6)]
[im 1/10]
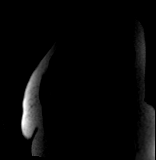

[Series 42: pre short axis · oblique · non-contrast · 8.0mm · 2.50mm/px · 1 of 10 slices shown (5 of 6)]
[im 1/10]
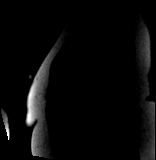

[Series 43: pre short axis · oblique · non-contrast · 8.0mm · 2.50mm/px · 1 of 10 slices shown (6 of 6)]
[im 1/10]
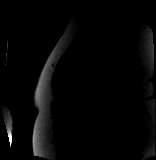

[Series 44: rest short axis · oblique · 8.0mm · 2.25mm/px · 1 of 60 slices shown (1 of 6)]
[im 1/60]
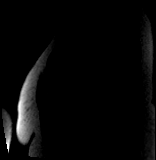

[Series 45: rest short axis · oblique · 8.0mm · 2.25mm/px · 1 of 60 slices shown (2 of 6)]
[im 1/60]
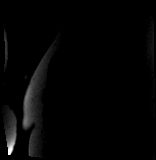

[Series 46: rest short axis · oblique · 8.0mm · 2.25mm/px · 1 of 60 slices shown (3 of 6)]
[im 1/60]
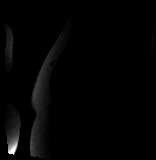

[Series 47: rest short axis · oblique · 8.0mm · 2.25mm/px · 1 of 60 slices shown (4 of 6)]
[im 1/60]
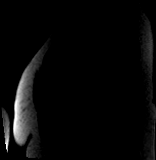

[Series 48: rest short axis · oblique · 8.0mm · 2.25mm/px · 1 of 60 slices shown (5 of 6)]
[im 1/60]
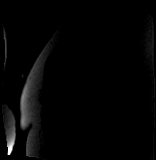

[Series 49: rest short axis · oblique · 8.0mm · 2.25mm/px · 1 of 60 slices shown (6 of 6)]
[im 1/60]
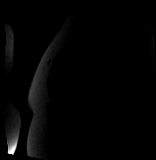

[Series 50: bSSFP · oblique · 8.0mm · 1.97mm/px · 1 of 17 slices shown (22 of 24)]
[im 1/17]
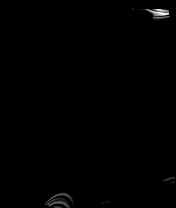

[Series 51: bSSFP · oblique · 8.0mm · 1.97mm/px · 1 of 17 slices shown (23 of 24)]
[im 1/17]
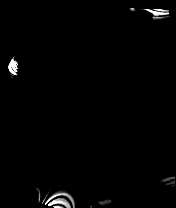

[Series 52: bSSFP · axial · 6.0mm · 1.41mm/px · 1 of 25 slices shown (24 of 24)]
[im 1/25]
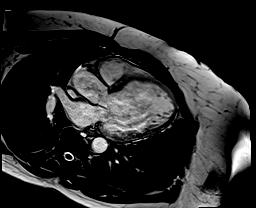

[Series 54: aortic valve cine · oblique · 6.0mm · 1.41mm/px · 1 of 19 slices shown]
[im 1/19]
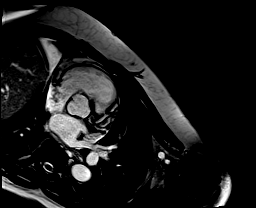

[45 of 48 positions shown; findings below may reference images not displayed]

FINDINGS: Limited images of the lung fields showed no gross abnormalities.

Moderate left ventricular dilation with normal wall thickness.
Diffuse severe hypokinesis with septal-lateral dyssynchrony
consistent with LBBB, LV EF 20%. No LV thrombus noted. Normal right
ventricular size with low normal to mildly decreased systolic
function, RV EF 44%. Mild left atrial enlargement. Normal right
atrium. Trivial mitral regurgitation. Trileaflet aortic valve with
no stenosis or regurgitation.

Delayed enhancement imaging: In one view, there was a small area of
subendocardial late gadolinium enhancement (LGE) at the apex, but
this could not be reproduced in other views so may have been
artifactual.

MEASUREMENTS:
MEASUREMENTS
LVEDV 306 mL
LVSV 60 mL
LVEF 20%

RVEDV 137 mL
RVSV 60 mL
RVEF 44%

T1 1127, ECV 31%
IMPRESSION: 1. Moderate LV dilation with diffuse severe LV hypokinesis and
prominent septal-lateral dyssynchrony consistent with LBBB. EF 20%.

2.  No LV thrombus.

3. Normal RV size with low normal to mildly decreased systolic
function, EF 44%.

4. Possible very small area of subendocardial LGE at the apex but
may be artifactual. Overall, no evidence for large prior MI,
myocarditis, or infiltrative disease like amyloidosis.

5. Mildly elevated extracellular volume percentage in the septum,
this is likely nonspecific.

With minimal to no LV scarring and prominent septal-lateral
dyssynchrony, this patient would likely be Krzysztof Sylwia Nizar candidate.

Yaquelin Barocio

## 2022-03-26 ENCOUNTER — Other Ambulatory Visit: Payer: Self-pay

## 2022-03-26 NOTE — Patient Outreach (Signed)
[facility] [facility]) Care Management  03/26/2022  LANICE FOLDEN [birth year] [id number]   First telephone outreach attempt to obtain mRS. No answer. Left message for returned call.  Philmore Pali [facility] Management Assistant [phone]

## 2022-03-28 ENCOUNTER — Other Ambulatory Visit: Payer: Self-pay

## 2022-03-28 NOTE — Patient Outreach (Signed)
[facility]) Care Management  03/28/2022  MAKAHLA KISER 09/03/1961 [id number]   Second telephone outreach attempt to obtain mRS. No answer. Left message for returned call.  Philmore Pali [facility] Management Assistant [phone]

## 2022-04-02 ENCOUNTER — Other Ambulatory Visit: Payer: Self-pay

## 2022-04-02 NOTE — Patient Outreach (Signed)
[facility] [facility]) Care Management  04/02/2022  [name] May 28, 1961 [id number]   3 outreach attempts were completed to obtain mRs. mRs could not be obtained because patient never returned my calls. mRs=7    [place] Management Assistant [phone]

## 2022-04-02 NOTE — Progress Notes (Unsigned)
Cardiology Office Note   Date:  04/03/2022   ID:  [name], DOB 1961/01/29, MRN [id number]  PCP:  Velna Hatchet, MD  Cardiologist:   Minus Breeding, MD   Chief Complaint  Patient presents with   Cardiomyopathy      History of Present Illness: [name] is a 61 y.o. female who presents for follow up of hypertensive cardiomyopathy.  Her EF was at one point 30% but two years ago it was up to 50 - 55%.     I last talked to her on virtual visit in 2020.    She was in the hospital in Feb.  She had a CVA.   There was an acute left MCA infarct.  EF was now found to be 20 - 25%.  MRI demonstrated no source of clot.   There was no evidence of previous MI or amyloid.  She had a negative screen for sleep apnea.  [name] was held secondary to AKI.  She had beta blocker titrated.  She was Covid positive.  She had right leg and left arm pain of unclear etiology.    At the last visit her Coreg was increased.  She is done well with med changes.  She had no lightheadedness, presyncope or syncope.  She denies any shortness of breath, PND or orthopnea.  She had no chest pressure, neck or arm discomfort.  She walks about twice a day for exercise.   Past Medical History:  Diagnosis Date   CHF (congestive heart failure) (HCC)    Hypertension    Overweight(278.02)    Sciatica    Secondary cardiomyopathy, unspecified    EF now 50%    Past Surgical History:  Procedure Laterality Date   ABDOMINAL HYSTERECTOMY       Current Outpatient Medications  Medication Sig Dispense Refill   acetaminophen (TYLENOL) 325 MG tablet Take 2 tablets (650 mg total) by mouth every 4 (four) hours as needed for headache or mild pain.     aspirin EC 81 MG EC tablet Take 1 tablet (81 mg total) by mouth daily. Swallow whole. 30 tablet 11   atorvastatin (LIPITOR) 80 MG tablet Take 1 tablet (80 mg total) by mouth daily. 30 tablet 1   carvedilol (COREG) 25 MG tablet Take 1 tablet (25 mg total) by mouth 2 (two) times  daily with a meal. 180 tablet 3   hydrALAZINE (APRESOLINE) 25 MG tablet Take 1 tablet (25 mg total) by mouth 3 (three) times daily. 90 tablet 3   HYDROcodone-acetaminophen (NORCO/VICODIN) 5-325 MG tablet Take 1 tablet by mouth every 6 (six) hours as needed for moderate pain.     isosorbide mononitrate (IMDUR) 30 MG 24 hr tablet Take 1 tablet (30 mg total) by mouth daily. 30 tablet 1   spironolactone (ALDACTONE) 25 MG tablet Take 25 mg by mouth daily.     zolpidem (AMBIEN) 5 MG tablet Take 5 mg by mouth at bedtime as needed for sleep.  1   No current facility-administered medications for this visit.    Allergies:   Latex and Chlorhexidine gluconate    ROS:  Please see the history of present illness.   Otherwise, review of systems are positive for none.   All other systems are reviewed and negative.    PHYSICAL EXAM: VS:  BP 124/86 (BP Location: Left Arm, Patient Position: Sitting, Cuff Size: Large)   Pulse 81   Ht '5\' 6"'$  (1.676 m)   Wt 203 lb (92.1  kg)   BMI 32.77 kg/m  , BMI Body mass index is 32.77 kg/m. GENERAL:  Well appearing NECK:  No jugular venous distention, waveform within normal limits, carotid upstroke brisk and symmetric, no bruits, no thyromegaly LUNGS:  Clear to auscultation bilaterally CHEST:  Unremarkable HEART:  PMI not displaced or sustained,S1 and S2 within normal limits, no S3, no S4, no clicks, no rubs, no murmurs ABD:  Flat, positive bowel sounds normal in frequency in pitch, no bruits, no rebound, no guarding, no midline pulsatile mass, no hepatomegaly, no splenomegaly EXT:  2 plus pulses throughout, no edema, no cyanosis no clubbing    EKG:  EKG is not ordered today.    Recent Labs: 12/14/2021: ALT 13 12/18/2021: BUN 44; Creatinine, Ser 2.28; Hemoglobin 10.3; Platelets 224; Potassium 4.0; Sodium 137    Lipid Panel    Component Value Date/Time   CHOL 172 12/14/2021 0354   TRIG 53 12/14/2021 0354   HDL 60 12/14/2021 0354   CHOLHDL 2.9 12/14/2021  0354   VLDL 11 12/14/2021 0354   LDLCALC 101 (H) 12/14/2021 0354      Wt Readings from Last 3 Encounters:  04/03/22 203 lb (92.1 kg)  02/18/22 203 lb (92.1 kg)  01/30/22 204 lb (92.5 kg)      Other studies Reviewed: Additional studies/ records that were reviewed today include: Labs Review of the above records demonstrates:  Please see elsewhere in the note.     ASSESSMENT AND PLAN:  NONISCHEMIC CARDIOMYOPATHY: Today I cannot titrate her meds further.  I am not going to add [place] with her renal insufficiency.  I think after the next visit if she is doing well and make no further med titrations we could order a follow-up echo.  HTN:    This is being managed in the context of treating his CHF   CKD:   Her last creatinine was 2.28.  I will follow-up with a basic metabolic profile.  CVA:    She had previous CVA and followed by neurology.  No change in therapy.   Current medicines are reviewed at length with the patient today.  The patient does not have concerns regarding medicines.  The following changes have been made:     None  Labs/ tests ordered today include:    Orders Placed This Encounter  Procedures   Basic metabolic panel   Hemoglobin   CBC     Disposition:   FU with APP in 6 months   Signed, Minus Breeding, MD  04/03/2022 10:48 AM    [place]

## 2022-04-03 ENCOUNTER — Encounter: Payer: Self-pay | Admitting: Cardiology

## 2022-04-03 ENCOUNTER — Ambulatory Visit (INDEPENDENT_AMBULATORY_CARE_PROVIDER_SITE_OTHER): Payer: Managed Care, Other (non HMO) | Admitting: Cardiology

## 2022-04-03 VITALS — BP 124/86 | HR 81 | Ht 66.0 in | Wt 203.0 lb

## 2022-04-03 DIAGNOSIS — E785 Hyperlipidemia, unspecified: Secondary | ICD-10-CM

## 2022-04-03 DIAGNOSIS — N1831 Chronic kidney disease, stage 3a: Secondary | ICD-10-CM | POA: Diagnosis not present

## 2022-04-03 DIAGNOSIS — I428 Other cardiomyopathies: Secondary | ICD-10-CM | POA: Diagnosis not present

## 2022-04-03 DIAGNOSIS — I10 Essential (primary) hypertension: Secondary | ICD-10-CM

## 2022-04-03 DIAGNOSIS — I639 Cerebral infarction, unspecified: Secondary | ICD-10-CM

## 2022-04-03 LAB — BASIC METABOLIC PANEL
BUN/Creatinine Ratio: 20 (ref 9–23)
BUN: 44 mg/dL — ABNORMAL HIGH (ref 6–24)
CO2: 18 mmol/L — ABNORMAL LOW (ref 20–29)
Calcium: 9.2 mg/dL (ref 8.7–10.2)
Chloride: 109 mmol/L — ABNORMAL HIGH (ref 96–106)
Creatinine, Ser: 2.24 mg/dL — ABNORMAL HIGH (ref 0.57–1.00)
Glucose: 89 mg/dL (ref 70–99)
Potassium: 5.2 mmol/L (ref 3.5–5.2)
Sodium: 141 mmol/L (ref 134–144)
eGFR: 25 mL/min/{1.73_m2} — ABNORMAL LOW (ref >59)

## 2022-04-03 LAB — CBC
Hematocrit: 35.4 % (ref 34.0–46.6)
Hemoglobin: 11.2 g/dL (ref 11.1–15.9)
MCH: 30.1 pg (ref 26.6–33.0)
MCHC: 31.6 g/dL (ref 31.5–35.7)
MCV: 95 fL (ref 79–97)
Platelets: 260 10*3/uL (ref 150–450)
RBC: 3.72 x10E6/uL — ABNORMAL LOW (ref 3.77–5.28)
RDW: 12.7 % (ref 11.7–15.4)
WBC: 4.4 10*3/uL (ref 3.4–10.8)

## 2022-04-03 NOTE — Patient Instructions (Signed)
Medication Instructions:  The current medical regimen is effective;  continue present plan and medications.  *If you need a refill on your cardiac medications before your next appointment, please call your pharmacy*   Lab Work: BMET, CBC, Hemoglobin  If you have labs (blood work) drawn today and your tests are completely normal, you will receive your results only by: [place] (if you have MyChart) OR A paper copy in the mail If you have any lab test that is abnormal or we need to change your treatment, we will call you to review the results.    Follow-Up: At [facility], you and your health needs are our priority.  As part of our continuing mission to provide you with exceptional heart care, we have created designated Provider Care Teams.  These Care Teams include your primary Cardiologist (physician) and Advanced Practice Providers (APPs -  Physician Assistants and Nurse Practitioners) who all work together to provide you with the care you need, when you need it.  We recommend signing up for the patient portal called "MyChart".  Sign up information is provided on this After Visit Summary.  MyChart is used to connect with patients for Virtual Visits (Telemedicine).  Patients are able to view lab/test results, encounter notes, upcoming appointments, etc.  Non-urgent messages can be sent to your provider as well.   To learn more about what you can do with MyChart, go to [email or web address].    Your next appointment:   6 month(s)  The format for your next appointment:   In Person  Provider:  Coletta Memos, NP

## 2022-04-04 ENCOUNTER — Encounter: Payer: Self-pay | Admitting: *Deleted

## 2022-05-03 ENCOUNTER — Other Ambulatory Visit: Payer: Self-pay | Admitting: Cardiology

## 2022-08-19 NOTE — Progress Notes (Deleted)
[facility] [address] [place]. [place] [address] [phone]       OFFICE FOLLOW-UP NOTE  [name] Date of Birth:  1961-10-05 Medical Record Number:  [id number]   Primary neurologist: Dr. Leonie Man Reason for visit: Stroke follow-up  No chief complaint on file.     HPI:   Update 08/20/2022 JM: Patient returns for 59-monthstroke follow-up.  Overall stable without new stroke/TIA symptoms and denies residual stroke deficits.  Compliant on aspirin and atorvastatin.  Blood pressure well controlled.  Closely follows with PCP and cardiology.     History provided for reference purposes only Consult visit 02/18/2022 Dr. SLeonie Man Ms. MGrahnis a 61year old African-American lady seen today for initial office visit following hospital admission for stroke in February 2023.  History is obtained from the patient and review of electronic medical records I personally reviewed pertinent available imaging films in PACS.  She has past medical history of congestive heart failure, hypertension, hyperlipidemia and obesity.  She presented on 12/13/2021 to the emergency room by EMS for evaluation of acute onset of right facial droop and speech disturbance.  She was at her workplace and talking to coworkers and symptoms developed suddenly.  Her blood pressure was in the 1825Ksystolics in blood glucose was 114 mg percent.  She was given thrombolysis with IV TNK and she was not a candidate for thrombectomy as there was no LVO noted on the MR angiogram.  2D echo showed diminished ejection fraction of 20 to 25% with global hypokinesis but no definite clot.  LDL cholesterol was 101 mg percent and hemoglobin A1c was 5.7.  Cardiac MRI was performed to look for intracardiac thrombus but was negative.  Patient was placed on aspirin and Plavix for 3 weeks followed by aspirin alone but she was also screened for sleep smart study but tested negative on the NOx 3 monitor for sleep apnea.  Patient states she  is done well since discharge facial droop resolved.  She has no residual weakness.  She occasionally gets short of breath when she tries to walk fast.  Otherwise she is doing well.  Blood pressure is well controlled today it is 131/91.  She is tolerating aspirin Plavix well without bleeding or bruising.  She remains on Lipitor without muscle aches and pains.  She has no therapy needs.  She has no complaints today..  ROS:   14 system review of systems is positive for shortness of breath, speech difficulties all other systems negative  PMH:  Past Medical History:  Diagnosis Date   CHF (congestive heart failure) (HCC)    Hypertension    Overweight(278.02)    Sciatica    Secondary cardiomyopathy, unspecified    EF now 50%    Social History:  Social History   Socioeconomic History   Marital status: Single    Spouse name: Not on file   Number of children: Not on file   Years of education: Not on file   Highest education level: Not on file  Occupational History   Not on file  Tobacco Use   Smoking status: Never   Smokeless tobacco: Never  Substance and Sexual Activity   Alcohol use: Yes    Alcohol/week: 7.0 - 14.0 standard drinks of alcohol    Types: 7 - 14 Cans of beer per week    Comment: social-daily beers   Drug use: No   Sexual activity: Yes    Birth control/protection: Surgical  Other Topics Concern  Not on file  Social History Narrative   Not on file   Social Determinants of Health   Financial Resource Strain: Not on file  Food Insecurity: Not on file  Transportation Needs: Not on file  Physical Activity: Not on file  Stress: Not on file  Social Connections: Not on file  Intimate Partner Violence: Not on file    Medications:   Current Outpatient Medications on File Prior to Visit  Medication Sig Dispense Refill   acetaminophen (TYLENOL) 325 MG tablet Take 2 tablets (650 mg total) by mouth every 4 (four) hours as needed for headache or mild pain.     aspirin  EC 81 MG EC tablet Take 1 tablet (81 mg total) by mouth daily. Swallow whole. 30 tablet 11   atorvastatin (LIPITOR) 80 MG tablet Take 1 tablet (80 mg total) by mouth daily. 30 tablet 1   carvedilol (COREG) 25 MG tablet Take 1 tablet (25 mg total) by mouth 2 (two) times daily with a meal. 180 tablet 3   hydrALAZINE (APRESOLINE) 25 MG tablet TAKE 1 TABLET(25 MG) BY MOUTH THREE TIMES DAILY 90 tablet 3   HYDROcodone-acetaminophen (NORCO/VICODIN) 5-325 MG tablet Take 1 tablet by mouth every 6 (six) hours as needed for moderate pain.     isosorbide mononitrate (IMDUR) 30 MG 24 hr tablet Take 1 tablet (30 mg total) by mouth daily. 30 tablet 1   spironolactone (ALDACTONE) 25 MG tablet Take 25 mg by mouth daily.     zolpidem (AMBIEN) 5 MG tablet Take 5 mg by mouth at bedtime as needed for sleep.  1   No current facility-administered medications on file prior to visit.    Allergies:   Allergies  Allergen Reactions   Latex Itching   Chlorhexidine Gluconate Hives    Unknown if this is the cause of the hives, raised areas around lab draw sites. No tape and area wiped with chlorhexidine.    Physical Exam There were no vitals filed for this visit. There is no height or weight on file to calculate BMI.   General: Obese middle-aged African-American lady seated, in no evident distress Head: head normocephalic and atraumatic.  Neck: supple with no carotid or supraclavicular bruits Cardiovascular: regular rate and rhythm, no murmurs Musculoskeletal: no deformity Skin:  no rash/petichiae Vascular:  Normal pulses all extremities  Neurologic Exam Mental Status: Awake and fully alert. Oriented to place and time. Recent and remote memory intact. Attention span, concentration and fund of knowledge appropriate. Mood and affect appropriate.  Cranial Nerves: Pupils equal, briskly reactive to light. Extraocular movements full without nystagmus. Visual fields full to confrontation. Hearing intact. Facial  sensation intact. Face, tongue, palate moves normally and symmetrically.  Motor: Normal bulk and tone. Normal strength in all tested extremity muscles. Sensory.: intact to touch ,pinprick .position and vibratory sensation.  Coordination: Rapid alternating movements normal in all extremities. Finger-to-nose and heel-to-shin performed accurately bilaterally. Gait and Station: Arises from chair without difficulty. Stance is normal. Gait demonstrates normal stride length and balance . Able to heel, toe and tandem walk without difficulty.  Reflexes: 1+ and symmetric. Toes downgoing.        ASSESSMENT/PLAN: 61 year old African-American lady with left MCA branch infarcts in February 2023 of cryptogenic etiology in the setting of cardiomyopathy and COVID infection.  Vascular risk factors of  obesity,cardiomyopathy, hyperlipidemia, hypertension.    -Continue aspirin 81 mg daily and atorvastatin 80 mg daily for secondary stroke prevention  -Continue close PCP follow-up for aggressive stroke risk  factor management including BP goal<130/90 and HLD with LDL goal<70 -continue close follow-up with cardiology for management of her congestive heart failure and cardiomyopathy.       I spent *** minutes of face-to-face and non-face-to-face time with patient.  This included previsit chart review, lab review, study review, order entry, electronic health record documentation, patient education  Frann Rider, [facility]  [facility] [address] [place], [place] [address]  Phone [phone] Fax [phone] Note: This document was prepared with digital dictation and possible smart phrase technology. Any transcriptional errors that result from this process are unintentional.

## 2022-08-20 ENCOUNTER — Ambulatory Visit: Payer: Self-pay | Admitting: Adult Health

## 2022-08-26 ENCOUNTER — Ambulatory Visit: Payer: Self-pay | Admitting: Adult Health

## 2022-08-26 ENCOUNTER — Telehealth: Payer: Self-pay | Admitting: Neurology

## 2022-08-26 NOTE — Progress Notes (Deleted)
[facility] [address] [place]. [place] [address] [phone]       OFFICE FOLLOW-UP NOTE  [name] Date of Birth:  29-Oct-1961 Medical Record Number:  [id number]   Primary neurologist: Dr. Leonie [name] Reason for visit: Stroke follow-up  No chief complaint on file.     HPI:   Update 08/26/2022 JM: Patient returns for 60-monthstroke follow-up.  Overall stable without new stroke/TIA symptoms and denies residual stroke deficits.  Compliant on aspirin and atorvastatin.  Blood pressure well controlled.  Closely follows with PCP and cardiology.     History provided for reference purposes only Consult visit 02/18/2022 Dr. SLeonie Man [name] a 61year old African-American lady seen today for initial office visit following hospital admission for stroke in February 2023.  History is obtained from the patient and review of electronic medical records I personally reviewed pertinent available imaging films in PACS.  She has past medical history of congestive heart failure, hypertension, hyperlipidemia and obesity.  She presented on 12/13/2021 to the emergency room by EMS for evaluation of acute onset of right facial droop and speech disturbance.  She was at her workplace and talking to coworkers and symptoms developed suddenly.  Her blood pressure was in the 1256Lsystolics in blood glucose was 114 mg percent.  She was given thrombolysis with IV TNK and she was not a candidate for thrombectomy as there was no LVO noted on the MR angiogram.  2D echo showed diminished ejection fraction of 20 to 25% with global hypokinesis but no definite clot.  LDL cholesterol was 101 mg percent and hemoglobin A1c was 5.7.  Cardiac MRI was performed to look for intracardiac thrombus but was negative.  Patient was placed on aspirin and Plavix for 3 weeks followed by aspirin alone but she was also screened for sleep smart study but tested negative on the NOx 3 monitor for sleep apnea.  Patient states she  is done well since discharge facial droop resolved.  She has no residual weakness.  She occasionally gets short of breath when she tries to walk fast.  Otherwise she is doing well.  Blood pressure is well controlled today it is 131/91.  She is tolerating aspirin Plavix well without bleeding or bruising.  She remains on Lipitor without muscle aches and pains.  She has no therapy needs.  She has no complaints today..  ROS:   14 system review of systems is positive for shortness of breath, speech difficulties all other systems negative  PMH:  Past Medical History:  Diagnosis Date   CHF (congestive heart failure) (HCC)    Hypertension    Overweight(278.02)    Sciatica    Secondary cardiomyopathy, unspecified    EF now 50%    Social History:  Social History   Socioeconomic History   Marital status: Single    Spouse name: Not on file   Number of children: Not on file   Years of education: Not on file   Highest education level: Not on file  Occupational History   Not on file  Tobacco Use   Smoking status: Never   Smokeless tobacco: Never  Substance and Sexual Activity   Alcohol use: Yes    Alcohol/week: 7.0 - 14.0 standard drinks of alcohol    Types: 7 - 14 Cans of beer per week    Comment: social-daily beers   Drug use: No   Sexual activity: Yes    Birth control/protection: Surgical  Other Topics Concern  Not on file  Social History Narrative   Not on file   Social Determinants of Health   Financial Resource Strain: Not on file  Food Insecurity: Not on file  Transportation Needs: Not on file  Physical Activity: Not on file  Stress: Not on file  Social Connections: Not on file  Intimate Partner Violence: Not on file    Medications:   Current Outpatient Medications on File Prior to Visit  Medication Sig Dispense Refill   acetaminophen (TYLENOL) 325 MG tablet Take 2 tablets (650 mg total) by mouth every 4 (four) hours as needed for headache or mild pain.     aspirin  EC 81 MG EC tablet Take 1 tablet (81 mg total) by mouth daily. Swallow whole. 30 tablet 11   atorvastatin (LIPITOR) 80 MG tablet Take 1 tablet (80 mg total) by mouth daily. 30 tablet 1   carvedilol (COREG) 25 MG tablet Take 1 tablet (25 mg total) by mouth 2 (two) times daily with a meal. 180 tablet 3   hydrALAZINE (APRESOLINE) 25 MG tablet TAKE 1 TABLET(25 MG) BY MOUTH THREE TIMES DAILY 90 tablet 3   HYDROcodone-acetaminophen (NORCO/VICODIN) 5-325 MG tablet Take 1 tablet by mouth every 6 (six) hours as needed for moderate pain.     isosorbide mononitrate (IMDUR) 30 MG 24 hr tablet Take 1 tablet (30 mg total) by mouth daily. 30 tablet 1   spironolactone (ALDACTONE) 25 MG tablet Take 25 mg by mouth daily.     zolpidem (AMBIEN) 5 MG tablet Take 5 mg by mouth at bedtime as needed for sleep.  1   No current facility-administered medications on file prior to visit.    Allergies:   Allergies  Allergen Reactions   Latex Itching   Chlorhexidine Gluconate Hives    Unknown if this is the cause of the hives, raised areas around lab draw sites. No tape and area wiped with chlorhexidine.    Physical Exam There were no vitals filed for this visit. There is no height or weight on file to calculate BMI.   General: Obese middle-aged African-American lady seated, in no evident distress Head: head normocephalic and atraumatic.  Neck: supple with no carotid or supraclavicular bruits Cardiovascular: regular rate and rhythm, no murmurs Musculoskeletal: no deformity Skin:  no rash/petichiae Vascular:  Normal pulses all extremities  Neurologic Exam Mental Status: Awake and fully alert. Oriented to place and time. Recent and remote memory intact. Attention span, concentration and fund of knowledge appropriate. Mood and affect appropriate.  Cranial Nerves: Pupils equal, briskly reactive to light. Extraocular movements full without nystagmus. Visual fields full to confrontation. Hearing intact. Facial  sensation intact. Face, tongue, palate moves normally and symmetrically.  Motor: Normal bulk and tone. Normal strength in all tested extremity muscles. Sensory.: intact to touch ,pinprick .position and vibratory sensation.  Coordination: Rapid alternating movements normal in all extremities. Finger-to-nose and heel-to-shin performed accurately bilaterally. Gait and Station: Arises from chair without difficulty. Stance is normal. Gait demonstrates normal stride length and balance . Able to heel, toe and tandem walk without difficulty.  Reflexes: 1+ and symmetric. Toes downgoing.        ASSESSMENT/PLAN: 61 year old African-American lady with left MCA branch infarcts in February 2023 of cryptogenic etiology in the setting of cardiomyopathy and COVID infection.  Vascular risk factors of  obesity,cardiomyopathy, hyperlipidemia, hypertension.    -Continue aspirin 81 mg daily and atorvastatin 80 mg daily for secondary stroke prevention  -Continue close PCP follow-up for aggressive stroke risk  factor management including BP goal<130/90 and HLD with LDL goal<70 -continue close follow-up with cardiology for management of her congestive heart failure and cardiomyopathy.       I spent *** minutes of face-to-face and non-face-to-face time with patient.  This included previsit chart review, lab review, study review, order entry, electronic health record documentation, patient education  Frann Rider, [facility]  [facility] [address] [place], [place] [address]  Phone [phone] Fax [phone] Note: This document was prepared with digital dictation and possible smart phrase technology. Any transcriptional errors that result from this process are unintentional.

## 2022-08-26 NOTE — Telephone Encounter (Signed)
Pt cancelled appt due to do not have copay.  Transferred pt to Billing.

## 2022-08-29 ENCOUNTER — Other Ambulatory Visit: Payer: Self-pay | Admitting: Cardiology

## 2022-08-30 DIAGNOSIS — I130 Hypertensive heart and chronic kidney disease with heart failure and stage 1 through stage 4 chronic kidney disease, or unspecified chronic kidney disease: Secondary | ICD-10-CM | POA: Diagnosis not present

## 2022-08-30 DIAGNOSIS — N184 Chronic kidney disease, stage 4 (severe): Secondary | ICD-10-CM | POA: Diagnosis not present

## 2022-08-30 DIAGNOSIS — Z23 Encounter for immunization: Secondary | ICD-10-CM | POA: Diagnosis not present

## 2022-09-09 NOTE — Progress Notes (Addendum)
Cardiology Office Note   Date:  09/12/2022   ID:  [name], DOB 11-18-1960, MRN [id number]  PCP:  Velna Hatchet, MD  Cardiologist:   Minus Breeding, MD   Chief Complaint  Patient presents with   Shortness of Breath      History of Present Illness: [name] is a 61 y.o. female who presents for follow up of hypertensive cardiomyopathy.  Her EF was at one point 30% but two years ago it was up to 50 - 55%.    She was in the hospital in Feb 2023.  She had a CVA.   There was an acute left MCA infarct.  EF was now found to be 20 - 25%.  MRI demonstrated no source of clot.   There was no evidence of previous MI or amyloid.  She had a negative screen for sleep apnea.  [name] was held secondary to AKI.  She had beta blocker titrated.  She was Covid positive.  She had right leg and left arm pain of unclear etiology.    She had some marginal blood pressures but they seem to be up a little bit.  She has renal insufficiency with a last creatinine being 2.24.  She has been getting some shortness of breath.  This happens around 2 AM in the morning.  She goes to bed around 7 PM.  She gets up early for work.  At 2 AM she often sits up and has to cough clearing out sputum.  He said it is kind of a coughing paroxysmal.  She then can get up and go to work.  She is not describing any new shortness of breath with work and no PND or orthopnea.  She had no new palpitations, presyncope or syncope.  She is not describing any chest pain.   Past Medical History:  Diagnosis Date   CHF (congestive heart failure) (HCC)    Hypertension    Overweight(278.02)    Sciatica    Secondary cardiomyopathy, unspecified    EF now 50%    Past Surgical History:  Procedure Laterality Date   ABDOMINAL HYSTERECTOMY       Current Outpatient Medications  Medication Sig Dispense Refill   acetaminophen (TYLENOL) 325 MG tablet Take 2 tablets (650 mg total) by mouth every 4 (four) hours as needed for headache or  mild pain.     aspirin EC 81 MG EC tablet Take 1 tablet (81 mg total) by mouth daily. Swallow whole. 30 tablet 11   atorvastatin (LIPITOR) 80 MG tablet Take 1 tablet (80 mg total) by mouth daily. 30 tablet 1   carvedilol (COREG) 25 MG tablet Take 1 tablet (25 mg total) by mouth 2 (two) times daily with a meal. 180 tablet 3   empagliflozin (JARDIANCE) 10 MG TABS tablet Take 1 tablet (10 mg total) by mouth daily before breakfast. 90 tablet 3   furosemide (LASIX) 20 MG tablet Take 1 tablet (20 mg total) by mouth daily. 90 tablet 3   hydrALAZINE (APRESOLINE) 25 MG tablet TAKE 1 TABLET(25 MG) BY MOUTH THREE TIMES DAILY 270 tablet 3   HYDROcodone-acetaminophen (NORCO/VICODIN) 5-325 MG tablet Take 1 tablet by mouth every 6 (six) hours as needed for moderate pain.     isosorbide mononitrate (IMDUR) 120 MG 24 hr tablet Take 1 tablet (120 mg total) by mouth daily. 90 tablet 3   spironolactone (ALDACTONE) 25 MG tablet Take 25 mg by mouth daily.     zolpidem (AMBIEN)  5 MG tablet Take 5 mg by mouth at bedtime as needed for sleep.  1   No current facility-administered medications for this visit.    Allergies:   Latex and Chlorhexidine gluconate    ROS:  Please see the history of present illness.   Otherwise, review of systems are positive for none.   All other systems are reviewed and negative.    PHYSICAL EXAM: VS:  BP 122/86 (BP Location: Left Arm, Patient Position: Sitting, Cuff Size: Large)   Ht 5' 5" (1.651 m)   Wt 190 lb 3.2 oz (86.3 kg)   SpO2 98%   BMI 31.65 kg/m  , BMI Body mass index is 31.65 kg/m. GENERAL:  Well appearing NECK:    Positive jugular venous distention at 10 cm at 45 degrees, waveform within normal limits, carotid upstroke brisk and symmetric, no bruits, no thyromegaly LUNGS:  Clear to auscultation bilaterally CHEST:  Unremarkable HEART:  PMI not displaced or sustained,S1 and S2 within normal limits, no S3, no S4, no clicks, no rubs, no murmurs ABD:  Flat, positive bowel  sounds normal in frequency in pitch, no bruits, no rebound, no guarding, no midline pulsatile mass, no hepatomegaly, no splenomegaly EXT:  2 plus pulses throughout, no edema, no cyanosis no clubbing    EKG:  EKG is  ordered today. Sinus rhythm, rate 93, left bundle branch block,   Recent Labs: 12/14/2021: ALT 13 04/03/2022: BUN 44; Creatinine, Ser 2.24; Hemoglobin 11.2; Platelets 260; Potassium 5.2; Sodium 141    Lipid Panel    Component Value Date/Time   CHOL 172 12/14/2021 0354   TRIG 53 12/14/2021 0354   HDL 60 12/14/2021 0354   CHOLHDL 2.9 12/14/2021 0354   VLDL 11 12/14/2021 0354   LDLCALC 101 (H) 12/14/2021 0354      Wt Readings from Last 3 Encounters:  09/12/22 190 lb 3.2 oz (86.3 kg)  04/03/22 203 lb (92.1 kg)  02/18/22 203 lb (92.1 kg)      Other studies Reviewed: Additional studies/ records that were reviewed today include: Labs Review of the above records demonstrates:  Please see elsewhere in the note.     ASSESSMENT AND PLAN:  NONISCHEMIC CARDIOMYOPATHY:   Today I am going to increase her Imdur to 120 mg daily.  I will add Jardiance 10 mg daily.  I will order a BNP level and a be met in a week.  I am going to have her take Lasix 20 mg daily as I think she is congested.  I will consider sending her for CRT.   HTN:   This is being managed in the context of treating his CHF  CKD:   Her last creatinine was 2.0.  As this is down I might eventually be able to titrate spironolactone.   CVA:   No change in therapy.     Current medicines are reviewed at length with the patient today.  The patient does not have concerns regarding medicines.  The following changes have been made:     As above  Labs/ tests ordered today include: As above  Orders Placed This Encounter  Procedures   B Nat Peptide   Basic metabolic panel   EKG 01-VCBS     Disposition:   FU with me in  in one month   Signed, Minus Breeding, MD  09/12/2022 10:48 AM    [place]

## 2022-09-12 ENCOUNTER — Encounter: Payer: Self-pay | Admitting: Cardiology

## 2022-09-12 ENCOUNTER — Ambulatory Visit: Payer: BC Managed Care – PPO | Attending: Cardiology | Admitting: Cardiology

## 2022-09-12 VITALS — BP 122/86 | Ht 65.0 in | Wt 190.2 lb

## 2022-09-12 DIAGNOSIS — N1832 Chronic kidney disease, stage 3b: Secondary | ICD-10-CM | POA: Diagnosis not present

## 2022-09-12 DIAGNOSIS — I10 Essential (primary) hypertension: Secondary | ICD-10-CM

## 2022-09-12 DIAGNOSIS — R0602 Shortness of breath: Secondary | ICD-10-CM | POA: Diagnosis not present

## 2022-09-12 DIAGNOSIS — I428 Other cardiomyopathies: Secondary | ICD-10-CM

## 2022-09-12 MED ORDER — FUROSEMIDE 20 MG PO TABS: 20.0000 mg | ORAL_TABLET | Freq: Every day | ORAL | 3 refills | Status: DC

## 2022-09-12 MED ORDER — ISOSORBIDE MONONITRATE ER 120 MG PO TB24: 120.0000 mg | ORAL_TABLET | Freq: Every day | ORAL | 3 refills | Status: DC

## 2022-09-12 MED ORDER — EMPAGLIFLOZIN 10 MG PO TABS: 10.0000 mg | ORAL_TABLET | Freq: Every day | ORAL | 3 refills | Status: DC

## 2022-09-12 NOTE — Patient Instructions (Addendum)
Medication Instructions:  Increase Imdur to 120 mg daily Start Jardiance 10 mg daily before breakfast  Start Lasix 20 mg daily Continue all other medications *If you need a refill on your cardiac medications before your next appointment, please call your pharmacy*   Lab Work: BNP and Bmet in 1 week If you have labs (blood work) drawn today and your tests are completely normal, you will receive your results only by: [place] (if you have MyChart) OR A paper copy in the mail If you have any lab test that is abnormal or we need to change your treatment, we will call you to review the results.   Testing/Procedures: None ordered   Follow-Up: At [facility], you and your health needs are our priority.  As part of our continuing mission to provide you with exceptional heart care, we have created designated Provider Care Teams.  These Care Teams include your primary Cardiologist (physician) and Advanced Practice Providers (APPs -  Physician Assistants and Nurse Practitioners) who all work together to provide you with the care you need, when you need it.  We recommend signing up for the patient portal called "MyChart".  Sign up information is provided on this After Visit Summary.  MyChart is used to connect with patients for Virtual Visits (Telemedicine).  Patients are able to view lab/test results, encounter notes, upcoming appointments, etc.  Non-urgent messages can be sent to your provider as well.   To learn more about what you can do with MyChart, go to [email or web address].    Your next appointment:  1 month    The format for your next appointment: Office   Provider:  Dr.Hochrein   Important Information About Sugar

## 2022-09-18 ENCOUNTER — Other Ambulatory Visit: Payer: Self-pay | Admitting: Internal Medicine

## 2022-09-18 DIAGNOSIS — Z1231 Encounter for screening mammogram for malignant neoplasm of breast: Secondary | ICD-10-CM

## 2022-09-20 ENCOUNTER — Ambulatory Visit
Admission: RE | Admit: 2022-09-20 | Discharge: 2022-09-20 | Disposition: A | Discharge status: Home / Self Care | Payer: BC Managed Care – PPO | Source: Ambulatory Visit | Attending: Internal Medicine | Admitting: Internal Medicine

## 2022-09-20 DIAGNOSIS — Z1231 Encounter for screening mammogram for malignant neoplasm of breast: Secondary | ICD-10-CM

## 2022-09-23 DIAGNOSIS — N1832 Chronic kidney disease, stage 3b: Secondary | ICD-10-CM | POA: Diagnosis not present

## 2022-09-23 DIAGNOSIS — R0602 Shortness of breath: Secondary | ICD-10-CM | POA: Diagnosis not present

## 2022-09-23 DIAGNOSIS — I10 Essential (primary) hypertension: Secondary | ICD-10-CM | POA: Diagnosis not present

## 2022-09-23 DIAGNOSIS — I428 Other cardiomyopathies: Secondary | ICD-10-CM | POA: Diagnosis not present

## 2022-09-24 ENCOUNTER — Telehealth: Payer: Self-pay | Admitting: *Deleted

## 2022-09-24 DIAGNOSIS — N1832 Chronic kidney disease, stage 3b: Secondary | ICD-10-CM

## 2022-09-24 LAB — BASIC METABOLIC PANEL
BUN/Creatinine Ratio: 20 (ref 9–23)
BUN: 59 mg/dL — ABNORMAL HIGH (ref 6–24)
CO2: 18 mmol/L — ABNORMAL LOW (ref 20–29)
Calcium: 9.1 mg/dL (ref 8.7–10.2)
Chloride: 107 mmol/L — ABNORMAL HIGH (ref 96–106)
Creatinine, Ser: 3 mg/dL — ABNORMAL HIGH (ref 0.57–1.00)
Glucose: 117 mg/dL — ABNORMAL HIGH (ref 70–99)
Potassium: 4.9 mmol/L (ref 3.5–5.2)
Sodium: 144 mmol/L (ref 134–144)
eGFR: 17 mL/min/{1.73_m2} — ABNORMAL LOW (ref >59)

## 2022-09-24 LAB — BRAIN NATRIURETIC PEPTIDE: BNP: 908.8 pg/mL — ABNORMAL HIGH (ref 0.0–100.0)

## 2022-09-24 NOTE — Telephone Encounter (Signed)
Patient returning call.

## 2022-09-24 NOTE — Telephone Encounter (Signed)
Left message for pt to call.

## 2022-09-24 NOTE — Telephone Encounter (Signed)
-----   Message from Minus Breeding, MD sent at 09/24/2022  7:48 AM EST ----- Creat is going up.  I would like for her to stop her Vania Rea which is the only change in meds.  Please make sure she is following with renal.  Call Ms. Bagg with the results and send results to Velna Hatchet, MD

## 2022-09-24 NOTE — Telephone Encounter (Signed)
Spoke with pt, aware to stop jardiance. She reports she does not see nephrology. Will make dr hochrein aware.

## 2022-09-25 NOTE — Telephone Encounter (Signed)
Referral placed.

## 2022-09-25 NOTE — Telephone Encounter (Signed)
Left message for pt to call to discuss referral and bnp results, Minus Breeding, MD  I would like to see her early next week.  She is to reduce salt and fluid intake.  If she gets SOB over the weekend she should come to the hospital for admission.  Does she feel better on the 20 mg Lasix daily that she started?

## 2022-10-01 NOTE — Telephone Encounter (Signed)
See result note.  

## 2022-10-10 NOTE — Progress Notes (Signed)
Cardiology Office Note   Date:  10/11/2022   ID:  [name], DOB [birth year], MRN [id number]  PCP:  Velna Hatchet, MD  Cardiologist:   Minus Breeding, MD   Chief Complaint  Patient presents with   Cardiomyopathy      History of Present Illness: [name] is a 61 y.o. female who presents for follow up of hypertensive cardiomyopathy.  Her EF was at one point 30% but two years ago it was up to 50 - 55%.    She was in the hospital in Feb 2023.  She had a CVA.   There was an acute left MCA infarct.  EF was now found to be 20 - 25%.  MRI demonstrated no source of clot.   There was no evidence of previous MI or amyloid.  She had a negative screen for sleep apnea.  [name] was held secondary to AKI.  She had beta blocker titrated.  She was Covid positive.    She had elevated creatinine after starting Farxiga we stopped this.  She has been taking her diuretic.  Has been taking her spironolactone.  She is not having any new shortness of breath, PND or orthopnea.  She has no chest pressure, neck or arm discomfort.  She is not having any weight gain or edemaany she has been taking her diuretic.  Has been taking her spironolactone.  She is not having any new shortness of breath, PND or orthopnea.  She has no chest pressure, neck or arm discomfort.  She is not having any weight gain or edema.   Past Medical History:  Diagnosis Date   CHF (congestive heart failure) (HCC)    Hypertension    Overweight(278.02)    Sciatica    Secondary cardiomyopathy, unspecified    EF now 50%    Past Surgical History:  Procedure Laterality Date   ABDOMINAL HYSTERECTOMY       Current Outpatient Medications  Medication Sig Dispense Refill   acetaminophen (TYLENOL) 325 MG tablet Take 2 tablets (650 mg total) by mouth every 4 (four) hours as needed for headache or mild pain.     aspirin EC 81 MG EC tablet Take 1 tablet (81 mg total) by mouth daily. Swallow whole. 30 tablet 11   atorvastatin (LIPITOR)  80 MG tablet Take 1 tablet (80 mg total) by mouth daily. 30 tablet 1   carvedilol (COREG) 25 MG tablet Take 1 tablet (25 mg total) by mouth 2 (two) times daily with a meal. 180 tablet 3   hydrALAZINE (APRESOLINE) 25 MG tablet TAKE 1 TABLET(25 MG) BY MOUTH THREE TIMES DAILY 270 tablet 3   HYDROcodone-acetaminophen (NORCO/VICODIN) 5-325 MG tablet Take 1 tablet by mouth every 6 (six) hours as needed for moderate pain.     isosorbide mononitrate (IMDUR) 120 MG 24 hr tablet Take 1 tablet (120 mg total) by mouth daily. 90 tablet 3   spironolactone (ALDACTONE) 25 MG tablet Take 25 mg by mouth daily.     zolpidem (AMBIEN) 5 MG tablet Take 5 mg by mouth at bedtime as needed for sleep.  1   furosemide (LASIX) 20 MG tablet Take 1 tablet (20 mg total) by mouth as needed (Swelling and SOB). 30 tablet 0   No current facility-administered medications for this visit.    Allergies:   Latex and Chlorhexidine gluconate    ROS:  Please see the history of present illness.   Otherwise, review of systems are positive for none.  All other systems are reviewed and negative.    PHYSICAL EXAM: VS:  BP 108/74 (BP Location: Left Arm, Patient Position: Sitting, Cuff Size: Normal)   Pulse 76   Ht '5\' 5"'$  (1.651 m)   Wt 188 lb (85.3 kg)   BMI 31.28 kg/m  , BMI Body mass index is 31.28 kg/m. GENERAL:  Well appearing NECK:  No jugular venous distention, waveform within normal limits, carotid upstroke brisk and symmetric, no bruits, no thyromegaly LUNGS:  Clear to auscultation bilaterally CHEST:  Unremarkable HEART:  PMI not displaced or sustained,S1 within normal limits and S2 with paradoxical splitting , no S3, no S4, no clicks, no rubs, no murmurs ABD:  Flat, positive bowel sounds normal in frequency in pitch, no bruits, no rebound, no guarding, no midline pulsatile mass, no hepatomegaly, no splenomegaly EXT:  2 plus pulses throughout, no edema, no cyanosis no clubbing  EKG:  EKG is not ordered today.  Recent  Labs: 12/14/2021: ALT 13 04/03/2022: Hemoglobin 11.2; Platelets 260 09/23/2022: BNP 908.8; BUN 59; Creatinine, Ser 3.00; Potassium 4.9; Sodium 144    Lipid Panel    Component Value Date/Time   CHOL 172 12/14/2021 0354   TRIG 53 12/14/2021 0354   HDL 60 12/14/2021 0354   CHOLHDL 2.9 12/14/2021 0354   VLDL 11 12/14/2021 0354   LDLCALC 101 (H) 12/14/2021 0354      Wt Readings from Last 3 Encounters:  10/11/22 188 lb (85.3 kg)  09/12/22 190 lb 3.2 oz (86.3 kg)  04/03/22 203 lb (92.1 kg)      Other studies Reviewed: Additional studies/ records that were reviewed today include: Labs Review of the above records demonstrates:  Please see elsewhere in the note.     ASSESSMENT AND PLAN:  NONISCHEMIC CARDIOMYOPATHY:   I am going to check a basic metabolic profile.  She is going to be taking Lasix as needed.  If her creatinine is still elevated I probably will have to stop her spironolactone.  I am going to repeat an echocardiogram.  If her ejection fraction still below 30% I like to send her for consideration of CRT.  She would agree to this.  HTN:  This is being managed in the context of treating his CHF   CKD:   Her last creatinine was 3.0 most recently and I will repeat this today.  She has been referred to nephrology.   CVA:   She does not report residual.  No change in therapy.    Current medicines are reviewed at length with the patient today.  The patient does not have concerns regarding medicines.  The following changes have been made:     As above  Labs/ tests ordered today include:       Orders Placed This Encounter  Procedures   Basic metabolic panel   ECHOCARDIOGRAM COMPLETE     Disposition:   FU with me in Feb.   Signed, Minus Breeding, MD  10/11/2022 9:17 AM    [place]

## 2022-10-11 ENCOUNTER — Encounter: Payer: Self-pay | Admitting: Cardiology

## 2022-10-11 ENCOUNTER — Ambulatory Visit: Payer: BC Managed Care – PPO | Attending: Cardiology | Admitting: Cardiology

## 2022-10-11 VITALS — BP 108/74 | HR 76 | Ht 65.0 in | Wt 188.0 lb

## 2022-10-11 DIAGNOSIS — I428 Other cardiomyopathies: Secondary | ICD-10-CM | POA: Diagnosis not present

## 2022-10-11 DIAGNOSIS — N1832 Chronic kidney disease, stage 3b: Secondary | ICD-10-CM

## 2022-10-11 DIAGNOSIS — Z79899 Other long term (current) drug therapy: Secondary | ICD-10-CM | POA: Diagnosis not present

## 2022-10-11 DIAGNOSIS — I10 Essential (primary) hypertension: Secondary | ICD-10-CM | POA: Diagnosis not present

## 2022-10-11 LAB — BASIC METABOLIC PANEL
BUN/Creatinine Ratio: 21 (ref 9–23)
BUN: 49 mg/dL — ABNORMAL HIGH (ref 6–24)
CO2: 18 mmol/L — ABNORMAL LOW (ref 20–29)
Calcium: 9.4 mg/dL (ref 8.7–10.2)
Chloride: 109 mmol/L — ABNORMAL HIGH (ref 96–106)
Creatinine, Ser: 2.32 mg/dL — ABNORMAL HIGH (ref 0.57–1.00)
Glucose: 99 mg/dL (ref 70–99)
Potassium: 4.6 mmol/L (ref 3.5–5.2)
Sodium: 142 mmol/L (ref 134–144)
eGFR: 24 mL/min/{1.73_m2} — ABNORMAL LOW (ref >59)

## 2022-10-11 MED ORDER — FUROSEMIDE 20 MG PO TABS: 20.0000 mg | ORAL_TABLET | ORAL | 0 refills | Status: DC | PRN

## 2022-10-11 NOTE — Patient Instructions (Signed)
Medication Instructions:   DECREASE Lasix to as needed for swelling and/or shortness of breath  *If you need a refill on your cardiac medications before your next appointment, please call your pharmacy*  Lab Work: Your physician recommends that you return for lab work TODAY:  BMP If you have labs (blood work) drawn today and your tests are completely normal, you will receive your results only by: [place] (if you have MyChart) OR A paper copy in the mail If you have any lab test that is abnormal or we need to change your treatment, we will call you to review the results.  Testing/Procedures: Your physician has requested that you have an echocardiogram. Echocardiography is a painless test that uses sound waves to create images of your heart. It provides your doctor with information about the size and shape of your heart and how well your heart's chambers and valves are working. This procedure takes approximately one hour. There are no restrictions for this procedure. Please do NOT wear cologne, perfume, aftershave, or lotions (deodorant is allowed). Please arrive 15 minutes prior to your appointment time.   Follow-Up: At [facility], you and your health needs are our priority.  As part of our continuing mission to provide you with exceptional heart care, we have created designated Provider Care Teams.  These Care Teams include your primary Cardiologist (physician) and Advanced Practice Providers (APPs -  Physician Assistants and Nurse Practitioners) who all work together to provide you with the care you need, when you need it.  We recommend signing up for the patient portal called "MyChart".  Sign up information is provided on this After Visit Summary.  MyChart is used to connect with patients for Virtual Visits (Telemedicine).  Patients are able to view lab/test results, encounter notes, upcoming appointments, etc.  Non-urgent messages can be sent to your provider as well.    To learn more about what you can do with MyChart, go to [email or web address].    Your next appointment:   2 month(s)  The format for your next appointment:   In Person  Provider:   Minus Breeding, MD     Other Instructions  Important Information About Sugar

## 2022-10-14 ENCOUNTER — Other Ambulatory Visit: Payer: Self-pay | Admitting: *Deleted

## 2022-10-14 DIAGNOSIS — N1832 Chronic kidney disease, stage 3b: Secondary | ICD-10-CM

## 2022-10-22 DIAGNOSIS — N1832 Chronic kidney disease, stage 3b: Secondary | ICD-10-CM | POA: Diagnosis not present

## 2022-10-23 ENCOUNTER — Encounter: Payer: Self-pay | Admitting: *Deleted

## 2022-10-23 LAB — BASIC METABOLIC PANEL
BUN/Creatinine Ratio: 22 (ref 9–23)
BUN: 53 mg/dL — ABNORMAL HIGH (ref 6–24)
CO2: 17 mmol/L — ABNORMAL LOW (ref 20–29)
Calcium: 9.5 mg/dL (ref 8.7–10.2)
Chloride: 107 mmol/L — ABNORMAL HIGH (ref 96–106)
Creatinine, Ser: 2.37 mg/dL — ABNORMAL HIGH (ref 0.57–1.00)
Glucose: 87 mg/dL (ref 70–99)
Potassium: 5.2 mmol/L (ref 3.5–5.2)
Sodium: 140 mmol/L (ref 134–144)
eGFR: 23 mL/min/{1.73_m2} — ABNORMAL LOW (ref >59)

## 2022-11-14 ENCOUNTER — Ambulatory Visit (HOSPITAL_COMMUNITY): Payer: BC Managed Care – PPO | Attending: Cardiology

## 2022-11-14 DIAGNOSIS — I428 Other cardiomyopathies: Secondary | ICD-10-CM | POA: Insufficient documentation

## 2022-11-14 LAB — ECHOCARDIOGRAM COMPLETE
Area-P 1/2: 5.38 cm2
S' Lateral: 5.5 cm

## 2022-11-14 MED ORDER — PERFLUTREN LIPID MICROSPHERE
1.0000 mL | INTRAVENOUS | Status: AC | PRN
  Administered 2022-11-14: 2 mL via INTRAVENOUS

## 2022-11-26 ENCOUNTER — Other Ambulatory Visit: Payer: Self-pay | Admitting: Cardiology

## 2022-11-29 ENCOUNTER — Telehealth: Payer: Self-pay | Admitting: Cardiology

## 2022-11-29 NOTE — Telephone Encounter (Signed)
Spoke with patient. She reports Dr. Percival Spanish called her about echo results. Unable to provide results - no result note. Sent to MD

## 2022-11-29 NOTE — Telephone Encounter (Signed)
Pt returning providers call regarding results. Please advise

## 2022-12-02 ENCOUNTER — Other Ambulatory Visit: Payer: Self-pay | Admitting: *Deleted

## 2022-12-02 DIAGNOSIS — I428 Other cardiomyopathies: Secondary | ICD-10-CM

## 2022-12-02 NOTE — Telephone Encounter (Signed)
Referral to EP by Hilda Blades RN

## 2022-12-05 DIAGNOSIS — I129 Hypertensive chronic kidney disease with stage 1 through stage 4 chronic kidney disease, or unspecified chronic kidney disease: Secondary | ICD-10-CM | POA: Diagnosis not present

## 2022-12-05 DIAGNOSIS — N189 Chronic kidney disease, unspecified: Secondary | ICD-10-CM | POA: Diagnosis not present

## 2022-12-05 DIAGNOSIS — N184 Chronic kidney disease, stage 4 (severe): Secondary | ICD-10-CM | POA: Diagnosis not present

## 2022-12-05 DIAGNOSIS — N2581 Secondary hyperparathyroidism of renal origin: Secondary | ICD-10-CM | POA: Diagnosis not present

## 2022-12-05 DIAGNOSIS — D631 Anemia in chronic kidney disease: Secondary | ICD-10-CM | POA: Diagnosis not present

## 2022-12-05 DIAGNOSIS — N179 Acute kidney failure, unspecified: Secondary | ICD-10-CM | POA: Diagnosis not present

## 2022-12-06 ENCOUNTER — Ambulatory Visit: Payer: BC Managed Care – PPO | Attending: Internal Medicine | Admitting: Internal Medicine

## 2022-12-06 ENCOUNTER — Encounter: Payer: Self-pay | Admitting: Internal Medicine

## 2022-12-06 VITALS — BP 146/80 | HR 86 | Ht 64.0 in | Wt 191.4 lb

## 2022-12-06 DIAGNOSIS — I10 Essential (primary) hypertension: Secondary | ICD-10-CM

## 2022-12-06 DIAGNOSIS — I428 Other cardiomyopathies: Secondary | ICD-10-CM | POA: Diagnosis not present

## 2022-12-06 LAB — CBC WITH DIFFERENTIAL/PLATELET
Basophils Absolute: 0 10*3/uL (ref 0.0–0.2)
Basos: 0 %
EOS (ABSOLUTE): 0.1 10*3/uL (ref 0.0–0.4)
Eos: 2 %
Hematocrit: 31.3 % — ABNORMAL LOW (ref 34.0–46.6)
Hemoglobin: 10.2 g/dL — ABNORMAL LOW (ref 11.1–15.9)
Lymphocytes Absolute: 0.9 10*3/uL (ref 0.7–3.1)
Lymphs: 17 %
MCH: 30.2 pg (ref 26.6–33.0)
MCHC: 32.6 g/dL (ref 31.5–35.7)
MCV: 93 fL (ref 79–97)
Monocytes Absolute: 0.8 10*3/uL (ref 0.1–0.9)
Monocytes: 14 %
Neutrophils Absolute: 3.6 10*3/uL (ref 1.4–7.0)
Neutrophils: 67 %
Platelets: 232 10*3/uL (ref 150–450)
RBC: 3.38 x10E6/uL — ABNORMAL LOW (ref 3.77–5.28)
RDW: 15.7 % — ABNORMAL HIGH (ref 11.7–15.4)
WBC: 5.4 10*3/uL (ref 3.4–10.8)

## 2022-12-06 LAB — BASIC METABOLIC PANEL
BUN/Creatinine Ratio: 21 (ref 9–23)
BUN: 42 mg/dL — ABNORMAL HIGH (ref 6–24)
CO2: 15 mmol/L — ABNORMAL LOW (ref 20–29)
Calcium: 9 mg/dL (ref 8.7–10.2)
Chloride: 113 mmol/L — ABNORMAL HIGH (ref 96–106)
Creatinine, Ser: 1.99 mg/dL — ABNORMAL HIGH (ref 0.57–1.00)
Glucose: 73 mg/dL (ref 70–99)
Potassium: 4.9 mmol/L (ref 3.5–5.2)
Sodium: 140 mmol/L (ref 134–144)
eGFR: 28 mL/min/{1.73_m2} — ABNORMAL LOW (ref >59)

## 2022-12-06 NOTE — Patient Instructions (Addendum)
Medication Instructions:  Your physician recommends that you continue on your current medications as directed. Please refer to the Current Medication list given to you today.  *If you need a refill on your cardiac medications before your next appointment, please call your pharmacy*  Lab Work: You will have a CBC and BMET drawn today, 12/06/22  Testing/Procedures: None ordered.  Follow-Up: Dr. [name] ordered a Medtronic Bi-Ventricular ICD. Diagnosis NICM; Your procedure is scheduled for January 03, 2023 at 300 pm.  You will arrive / check in two hours early at [facility], Entrance A, [place].  See Instruction letter:      [id number] Defibrillator Implantation An implantable cardioverter defibrillator (ICD) is a device that identifies and corrects abnormal heart rhythms. Cardioverter defibrillator implantation is a surgery to place an ICD under the skin in the chest or abdomen. An ICD has a battery, a small computer (pulse generator), and wires (leads) that go into the heart. The ICD detects and corrects two types of dangerous irregular heart rhythms (arrhythmias): A rapid heart rhythm in the lower chambers of the heart (ventricles). This is called ventricular tachycardia. The ventricles contracting in an uncoordinated way. This is called ventricular fibrillation. There are different types of ICDs, and the electrical signals from the ICD can be programmed differently based on the condition being treated. The electrical signals from the ICD can be low-energy pulses, high-energy shocks, or a combination of the two. The low-energy pulses are generally used to restore the heartbeat to normal when it is either too slow (bradycardia) or too fast. These pulses are painless. The high-energy shocks are used to treat abnormal rhythms such as ventricular tachycardia or ventricular fibrillation. This shock may feel like a strong jolt in the chest. Your health care provider may recommend  an ICD if you have: Had a ventricular arrhythmia in the past. A damaged heart because of a disease or heart condition. A weakened heart muscle from a heart attack or cardiac arrest. A congenital heart defect. Long QT syndrome, which is a disorder of the heart's electrical system. Brugada syndrome, which is a condition that causes a disruption of the heart's normal rhythm. Tell a health care provider about: Any allergies you have. All medicines you are taking, including vitamins, herbs, eye drops, creams, and over-the-counter medicines. Any problems you or family members have had with anesthetic medicines. Any blood disorders you have. Any surgeries you have had. Any medical conditions you have. Whether you are pregnant or may be pregnant. What are the risks? Generally, this is a safe procedure. However, problems may occur, including: Infection. Bleeding. Allergic reactions to medicines used during the procedure. Blood clots. Swelling or bruising. Damage to nearby structures or organs, such as nerves, lungs, blood vessels, or the heart where the ICD leads or pulse generator is implanted. What happens before the procedure? Staying hydrated Follow instructions from your health care provider about hydration, which may include: Up to 2 hours before the procedure - you may continue to drink clear liquids, such as water, clear fruit juice, black coffee, and plain tea.  Eating and drinking restrictions Follow instructions from your health care provider about eating and drinking, which may include: 8 hours before the procedure - stop eating heavy meals or foods, such as meat, fried foods, or fatty foods. 6 hours before the procedure - stop eating light meals or foods, such as toast or cereal. 6 hours before the procedure - stop drinking milk or drinks that contain milk. 2  hours before the procedure - stop drinking clear liquids. Medicines Ask your health care provider about: Changing or  stopping your regular medicines. This is especially important if you are taking diabetes medicines or blood thinners. Taking medicines such as aspirin and ibuprofen. These medicines can thin your blood. Do not take these medicines unless your health care provider tells you to take them. Taking over-the-counter medicines, vitamins, herbs, and supplements. Tests You may have an exam or testing. These may include: Blood tests. A test to check the electrical signals in your heart (electrocardiogram, ECG). Imaging tests, such as a chest X-ray. Echocardiogram. This is an ultrasound of your heart to evaluate your heart structures and function. An event monitor or Holter monitor to wear at home. General instructions Do not use any products that contain nicotine or tobacco for at least 4 weeks before the procedure. These products include cigarettes, chewing tobacco, and vaping devices, such as e-cigarettes. If you need help quitting, ask your health care provider. Ask your health care provider: How your procedure site will be marked. What steps will be taken to help prevent infection. These may include: Removing hair at the surgery site. Washing skin with a germ-killing soap. Taking antibiotic medicine. You may be asked to shower with a germ-killing soap. Plan to have a responsible adult take you home from the hospital or clinic. What happens during the procedure?  Small monitors will be put on your body. They will be used to check your heart rate, blood pressure, and oxygen level. A pair of sticky pads (defibrillator pads) may be placed on your back and chest. These pads are able to pace your heart as needed during the procedure. An IV will be inserted into one of your veins. You will be given one or more of the following: A medicine to help you relax (sedative). A medicine to numb the area (local anesthetic). A medicine to make you fall asleep(general anesthetic). A small incision will be made  to create a deep pocket under the skin of your chest or abdomen. Leads will be guided through a blood vessel into your heart and attached to your heart muscles. Depending on the ICD, the leads may go into one ventricle, or they may go into both ventricles and into an upper chamber of the heart. An X-ray machine (fluoroscope) will be used to help guide the leads. The other end of the leads will be attached to the pulse generator. The pulse generator will be placed into the pocket under the skin. The ICD will be tested, and your health care provider will program the ICD for the condition being treated. The incision will be closed with stitches (sutures), skin glue, adhesive strips, or staples. A bandage (dressing) will be placed over the incision. The procedure may vary among health care providers and hospitals. What happens after the procedure? Your blood pressure, heart rate, breathing rate, and blood oxygen level will be monitored until you leave the hospital or clinic. Your health care provider will also monitor your ICD to make sure it is working properly. A chest X-ray will be taken to check that the ICD is in the right place. Do not raise the arm on the side of your procedure higher than your shoulder for as long as told by your health care provider. This is usually at least 6 weeks. You may be given an identification card explaining that you have an ICD. You will be given a remote home monitoring device to use with your  ICD to allow your device to communicate with your clinic. Summary An implantable cardioverter defibrillator (ICD) is a device that identifies and corrects abnormal heart rhythms. Cardioverter defibrillator implantation is a surgery to place an ICD under the skin in the chest or abdomen. An ICD consists of a battery, a small computer (pulse generator), and wires (leads) that go into the heart. During the procedure, the ICD will be tested, and your health care provider will  program the ICD for the condition being treated. After the procedure, a chest X-ray will be taken to check that the ICD is in the right place. This information is not intended to replace advice given to you by your health care provider. Make sure you discuss any questions you have with your health care provider. Document Revised: 04/19/2020 Document Reviewed: 04/19/2020 Elsevier Patient Education  [place].

## 2022-12-06 NOTE — H&P (View-Only) (Signed)
HPI [name] is referred by Dr. [facility] for consideration for biv ICD insertion. She is a pleasant 62 yo woman with a non-ischemic/HTN CM and EF of 20%. She has LBBB and class 2 CHF. She recently had to stop the entresto due to renal insufficiency. She has not had syncope. She has mild peripheral edema. Her EF has been up and down in the past but most recently down.  Allergies  Allergen Reactions   Latex Itching   Chlorhexidine Gluconate Hives    Unknown if this is the cause of the hives, raised areas around lab draw sites. No tape and area wiped with chlorhexidine.     Current Outpatient Medications  Medication Sig Dispense Refill   acetaminophen (TYLENOL) 325 MG tablet Take 2 tablets (650 mg total) by mouth every 4 (four) hours as needed for headache or mild pain.     aspirin EC 81 MG EC tablet Take 1 tablet (81 mg total) by mouth daily. Swallow whole. 30 tablet 11   atorvastatin (LIPITOR) 80 MG tablet Take 1 tablet (80 mg total) by mouth daily. 30 tablet 1   carvedilol (COREG) 25 MG tablet Take 1 tablet (25 mg total) by mouth 2 (two) times daily with a meal. 180 tablet 3   furosemide (LASIX) 20 MG tablet Take 1 tablet (20 mg total) by mouth daily. 60 tablet 0   hydrALAZINE (APRESOLINE) 25 MG tablet TAKE 1 TABLET(25 MG) BY MOUTH THREE TIMES DAILY 270 tablet 3   HYDROcodone-acetaminophen (NORCO/VICODIN) 5-325 MG tablet Take 1 tablet by mouth every 6 (six) hours as needed for moderate pain.     isosorbide mononitrate (IMDUR) 120 MG 24 hr tablet Take 1 tablet (120 mg total) by mouth daily. 90 tablet 3   spironolactone (ALDACTONE) 25 MG tablet Take 25 mg by mouth daily.     zolpidem (AMBIEN) 5 MG tablet Take 5 mg by mouth at bedtime as needed for sleep.  1   No current facility-administered medications for this visit.     Past Medical History:  Diagnosis Date   CHF (congestive heart failure) (HCC)    Hypertension    Overweight(278.02)    Sciatica    Secondary cardiomyopathy,  unspecified    EF now 50%    ROS:   All systems reviewed and negative except as noted in the HPI.   Past Surgical History:  Procedure Laterality Date   ABDOMINAL HYSTERECTOMY       Family History  Problem Relation Age of Onset   Cerebral aneurysm Mother 58       ruptured   Hypertension Mother        severe   Heart attack Father 20   Coronary artery disease Other    Hypertension Brother        severe   Colon cancer Neg Hx    Breast cancer Neg Hx      Social History   Socioeconomic History   Marital status: Single    Spouse name: Not on file   Number of children: Not on file   Years of education: Not on file   Highest education level: Not on file  Occupational History   Not on file  Tobacco Use   Smoking status: Never   Smokeless tobacco: Never  Substance and Sexual Activity   Alcohol use: Yes    Alcohol/week: 7.0 - 14.0 standard drinks of alcohol    Types: 7 - 14 Cans of beer per week  Comment: social-daily beers   Drug use: No   Sexual activity: Yes    Birth control/protection: Surgical  Other Topics Concern   Not on file  Social History Narrative   Not on file   Social Determinants of Health   Financial Resource Strain: Not on file  Food Insecurity: Not on file  Transportation Needs: Not on file  Physical Activity: Not on file  Stress: Not on file  Social Connections: Not on file  Intimate Partner Violence: Not on file     BP (!) 146/80   Pulse 86   Ht '5\' 4"'$  (1.626 m)   Wt 191 lb 6.4 oz (86.8 kg)   SpO2 96%   BMI 32.85 kg/m   Physical Exam:  Well appearing NAD HEENT: Unremarkable Neck:  No JVD, no thyromegally Lymphatics:  No adenopathy Back:  No CVA tenderness Lungs:  Clear with no wheezes HEART:  Regular rate rhythm, spit S2 and summation gallop is present.  Abd:  soft, positive bowel sounds, no organomegally, no rebound, no guarding Ext:  2 plus pulses, no edema, no cyanosis, no clubbing Skin:  No rashes no nodules Neuro:   CN II through XII intact, motor grossly intact  EKG - nsr with LBBB  Assess/Plan: NICM/Chronic systolic heart failure, EF 20%, LBBB - I have discussed the treatment options with the paitent and the risks/benefits/goals/expectations of biv ICD insertion were reviewed and she wishes to proceed. HTN -her bp is up a little today. She has white coat HTN.   [name] Overlie Gustave Lindeman,MD

## 2022-12-06 NOTE — Progress Notes (Signed)
    HPI [name] is referred by Dr. JH for consideration for biv ICD insertion. She is a pleasant 61 yo woman with a non-ischemic/HTN CM and EF of 20%. She has LBBB and class 2 CHF. She recently had to stop the entresto due to renal insufficiency. She has not had syncope. She has mild peripheral edema. Her EF has been up and down in the past but most recently down.  Allergies  Allergen Reactions   Latex Itching   Chlorhexidine Gluconate Hives    Unknown if this is the cause of the hives, raised areas around lab draw sites. No tape and area wiped with chlorhexidine.     Current Outpatient Medications  Medication Sig Dispense Refill   acetaminophen (TYLENOL) 325 MG tablet Take 2 tablets (650 mg total) by mouth every 4 (four) hours as needed for headache or mild pain.     aspirin EC 81 MG EC tablet Take 1 tablet (81 mg total) by mouth daily. Swallow whole. 30 tablet 11   atorvastatin (LIPITOR) 80 MG tablet Take 1 tablet (80 mg total) by mouth daily. 30 tablet 1   carvedilol (COREG) 25 MG tablet Take 1 tablet (25 mg total) by mouth 2 (two) times daily with a meal. 180 tablet 3   furosemide (LASIX) 20 MG tablet Take 1 tablet (20 mg total) by mouth daily. 60 tablet 0   hydrALAZINE (APRESOLINE) 25 MG tablet TAKE 1 TABLET(25 MG) BY MOUTH THREE TIMES DAILY 270 tablet 3   HYDROcodone-acetaminophen (NORCO/VICODIN) 5-325 MG tablet Take 1 tablet by mouth every 6 (six) hours as needed for moderate pain.     isosorbide mononitrate (IMDUR) 120 MG 24 hr tablet Take 1 tablet (120 mg total) by mouth daily. 90 tablet 3   spironolactone (ALDACTONE) 25 MG tablet Take 25 mg by mouth daily.     zolpidem (AMBIEN) 5 MG tablet Take 5 mg by mouth at bedtime as needed for sleep.  1   No current facility-administered medications for this visit.     Past Medical History:  Diagnosis Date   CHF (congestive heart failure) (HCC)    Hypertension    Overweight(278.02)    Sciatica    Secondary cardiomyopathy,  unspecified    EF now 50%    ROS:   All systems reviewed and negative except as noted in the HPI.   Past Surgical History:  Procedure Laterality Date   ABDOMINAL HYSTERECTOMY       Family History  Problem Relation Age of Onset   Cerebral aneurysm Mother 45       ruptured   Hypertension Mother        severe   Heart attack Father 45   Coronary artery disease Other    Hypertension Brother        severe   Colon cancer Neg Hx    Breast cancer Neg Hx      Social History   Socioeconomic History   Marital status: Single    Spouse name: Not on file   Number of children: Not on file   Years of education: Not on file   Highest education level: Not on file  Occupational History   Not on file  Tobacco Use   Smoking status: Never   Smokeless tobacco: Never  Substance and Sexual Activity   Alcohol use: Yes    Alcohol/week: 7.0 - 14.0 standard drinks of alcohol    Types: 7 - 14 Cans of beer per week      Comment: social-daily beers   Drug use: No   Sexual activity: Yes    Birth control/protection: Surgical  Other Topics Concern   Not on file  Social History Narrative   Not on file   Social Determinants of Health   Financial Resource Strain: Not on file  Food Insecurity: Not on file  Transportation Needs: Not on file  Physical Activity: Not on file  Stress: Not on file  Social Connections: Not on file  Intimate Partner Violence: Not on file     BP (!) 146/80   Pulse 86   Ht 5' 4" (1.626 m)   Wt 191 lb 6.4 oz (86.8 kg)   SpO2 96%   BMI 32.85 kg/m   Physical Exam:  Well appearing NAD HEENT: Unremarkable Neck:  No JVD, no thyromegally Lymphatics:  No adenopathy Back:  No CVA tenderness Lungs:  Clear with no wheezes HEART:  Regular rate rhythm, spit S2 and summation gallop is present.  Abd:  soft, positive bowel sounds, no organomegally, no rebound, no guarding Ext:  2 plus pulses, no edema, no cyanosis, no clubbing Skin:  No rashes no nodules Neuro:   CN II through XII intact, motor grossly intact  EKG - nsr with LBBB  Assess/Plan: NICM/Chronic systolic heart failure, EF 20%, LBBB - I have discussed the treatment options with the paitent and the risks/benefits/goals/expectations of biv ICD insertion were reviewed and she wishes to proceed. HTN -her bp is up a little today. She has white coat HTN.   Adeliz Tonkinson,MD 

## 2022-12-08 LAB — LAB REPORT - SCANNED
Creatinine, POC: 127.9 mg/dL
EGFR: 25
Microalb Creat Ratio: 58
Microalbumin, Urine: 73.8

## 2022-12-10 ENCOUNTER — Other Ambulatory Visit: Payer: Self-pay | Admitting: Nephrology

## 2022-12-10 DIAGNOSIS — N184 Chronic kidney disease, stage 4 (severe): Secondary | ICD-10-CM

## 2022-12-12 NOTE — Progress Notes (Signed)
Cardiology Office Note   Date:  12/13/2022   ID:  [name], DOB 01-12-1961, MRN [id number]  PCP:  Velna Hatchet, MD  Cardiologist:   Minus Breeding, MD   No chief complaint on file.     History of Present Illness: [name] is a 62 y.o. female who presents for follow up of hypertensive cardiomyopathy.  Her EF was at one point 30% but two years ago it was up to 50 - 55%.    She was in the hospital in Feb 2023.  She had a CVA.   There was an acute left MCA infarct.  EF was now found to be 20 - 25%.  MRI demonstrated no source of clot.   There was no evidence of previous MI or amyloid.  She had a negative screen for sleep apnea.  [name] was held secondary to AKI.  She had beta blocker titrated.  She was Covid positive.   She had elevated creatinine after starting Farxiga we stopped this.   I repeated an echo recently and she was found to have an EF still at 30%.  I sent her to EP to consider CRT.  She is to have this on March 1st.     Since I last saw her she has done okay.  Her creatinine is actually down to 1.99 which is the best I seen in a while.  The patient denies any new symptoms such as chest discomfort, neck or arm discomfort. There has been no new shortness of breath, PND or orthopnea. There have been no reported palpitations, presyncope or syncope.    Past Medical History:  Diagnosis Date   CHF (congestive heart failure) (HCC)    Hypertension    Overweight(278.02)    Sciatica    Secondary cardiomyopathy, unspecified    EF now 50%    Past Surgical History:  Procedure Laterality Date   ABDOMINAL HYSTERECTOMY       Current Outpatient Medications  Medication Sig Dispense Refill   acetaminophen (TYLENOL) 325 MG tablet Take 2 tablets (650 mg total) by mouth every 4 (four) hours as needed for headache or mild pain.     aspirin EC 81 MG EC tablet Take 1 tablet (81 mg total) by mouth daily. Swallow whole. 30 tablet 11   atorvastatin (LIPITOR) 80 MG tablet Take 1  tablet (80 mg total) by mouth daily. 30 tablet 1   carvedilol (COREG) 25 MG tablet Take 1 tablet (25 mg total) by mouth 2 (two) times daily with a meal. 180 tablet 3   furosemide (LASIX) 20 MG tablet Take 1 tablet (20 mg total) by mouth daily. (Patient taking differently: Take 20 mg by mouth as needed.) 60 tablet 0   hydrALAZINE (APRESOLINE) 25 MG tablet TAKE 1 TABLET(25 MG) BY MOUTH THREE TIMES DAILY 270 tablet 3   HYDROcodone-acetaminophen (NORCO/VICODIN) 5-325 MG tablet Take 1 tablet by mouth every 6 (six) hours as needed for moderate pain.     isosorbide mononitrate (IMDUR) 120 MG 24 hr tablet Take 1 tablet (120 mg total) by mouth daily. 90 tablet 3   spironolactone (ALDACTONE) 25 MG tablet Take 25 mg by mouth daily.     zolpidem (AMBIEN) 5 MG tablet Take 5 mg by mouth at bedtime as needed for sleep.  1   No current facility-administered medications for this visit.    Allergies:   Latex and Chlorhexidine gluconate    ROS:  Please see the history of present illness.  Otherwise, review of systems are positive for none.   All other systems are reviewed and negative.    PHYSICAL EXAM: VS:  BP 124/84   Pulse 80   Ht 5' 4"$  (1.626 m)   Wt 184 lb 12.8 oz (83.8 kg)   SpO2 100%   BMI 31.72 kg/m  , BMI Body mass index is 31.72 kg/m. GENERAL:  Well appearing NECK:  No jugular venous distention, waveform within normal limits, carotid upstroke brisk and symmetric, no bruits, no thyromegaly LUNGS:  Clear to auscultation bilaterally CHEST:  Unremarkable HEART:  PMI not displaced or sustained,S1 and paradoxical splitting S2 within normal limits, no S3, no S4, no clicks, no rubs, no murmurs ABD:  Flat, positive bowel sounds normal in frequency in pitch, no bruits, no rebound, no guarding, no midline pulsatile mass, no hepatomegaly, no splenomegaly EXT:  2 plus pulses throughout, no edema, no cyanosis no clubbing   EKG:  EKG is not ordered today. NA   Recent Labs: 12/14/2021: ALT  13 09/23/2022: BNP 908.8 12/06/2022: BUN 42; Creatinine, Ser 1.99; Hemoglobin 10.2; Platelets 232; Potassium 4.9; Sodium 140    Lipid Panel    Component Value Date/Time   CHOL 172 12/14/2021 0354   TRIG 53 12/14/2021 0354   HDL 60 12/14/2021 0354   CHOLHDL 2.9 12/14/2021 0354   VLDL 11 12/14/2021 0354   LDLCALC 101 (H) 12/14/2021 0354      Wt Readings from Last 3 Encounters:  12/13/22 184 lb 12.8 oz (83.8 kg)  12/06/22 191 lb 6.4 oz (86.8 kg)  10/11/22 188 lb (85.3 kg)      Other studies Reviewed: Additional studies/ records that were reviewed today include: Labs Review of the above records demonstrates:  Please see elsewhere in the note.     ASSESSMENT AND PLAN:  NONISCHEMIC CARDIOMYOPATHY:   She seems to be doing well.  I am going to continue the meds as listed.  She is only taking Lasix about once a week and seems to be euvolemic.  She will get her device as above.  HTN:  This is being managed in the context of treating his CHF   CKD:   Her last creatinine was 1.99 which is improved.  No change in therapy.   Current medicines are reviewed at length with the patient today.  The patient does not have concerns regarding medicines.  The following changes have been made:     None  Labs/ tests ordered today include:       No orders of the defined types were placed in this encounter.    Disposition:   FU with me in 3 months. Ronnell Guadalajara, MD  12/13/2022 12:14 PM    [place]

## 2022-12-13 ENCOUNTER — Ambulatory Visit: Payer: BC Managed Care – PPO | Attending: Cardiology | Admitting: Cardiology

## 2022-12-13 ENCOUNTER — Encounter: Payer: Self-pay | Admitting: Cardiology

## 2022-12-13 VITALS — BP 124/84 | HR 80 | Ht 64.0 in | Wt 184.8 lb

## 2022-12-13 DIAGNOSIS — N1831 Chronic kidney disease, stage 3a: Secondary | ICD-10-CM

## 2022-12-13 DIAGNOSIS — I10 Essential (primary) hypertension: Secondary | ICD-10-CM | POA: Diagnosis not present

## 2022-12-13 DIAGNOSIS — I428 Other cardiomyopathies: Secondary | ICD-10-CM | POA: Diagnosis not present

## 2022-12-13 NOTE — Patient Instructions (Signed)
Medication Instructions:  Your physician recommends that you continue on your current medications as directed. Please refer to the Current Medication list given to you today.  *If you need a refill on your cardiac medications before your next appointment, please call your pharmacy*   Lab Work: None If you have labs (blood work) drawn today and your tests are completely normal, you will receive your results only by: [place] (if you have MyChart) OR A paper copy in the mail If you have any lab test that is abnormal or we need to change your treatment, we will call you to review the results.   Testing/Procedures: None   Follow-Up: At [facility], you and your health needs are our priority.  As part of our continuing mission to provide you with exceptional heart care, we have created designated Provider Care Teams.  These Care Teams include your primary Cardiologist (physician) and Advanced Practice Providers (APPs -  Physician Assistants and Nurse Practitioners) who all work together to provide you with the care you need, when you need it.  We recommend signing up for the patient portal called "MyChart".  Sign up information is provided on this After Visit Summary.  MyChart is used to connect with patients for Virtual Visits (Telemedicine).  Patients are able to view lab/test results, encounter notes, upcoming appointments, etc.  Non-urgent messages can be sent to your provider as well.   To learn more about what you can do with MyChart, go to [email or web address].    Your next appointment:   3 month(s)  Provider:   Minus Breeding, MD

## 2023-01-02 NOTE — Pre-Procedure Instructions (Signed)
Instructed patient on the following items: Arrival time 1300 Nothing to eat or drink after midnight No meds AM of procedure Responsible person to drive you home and stay with you for 24 hrs Wash with special soap night before and morning of procedure

## 2023-01-03 ENCOUNTER — Ambulatory Visit (HOSPITAL_COMMUNITY)
Admission: RE | Admit: 2023-01-03 | Discharge: 2023-01-03 | Disposition: A | Discharge status: Home / Self Care | Payer: BC Managed Care – PPO | Attending: Internal Medicine | Admitting: Internal Medicine

## 2023-01-03 ENCOUNTER — Ambulatory Visit (HOSPITAL_COMMUNITY): Payer: BC Managed Care – PPO

## 2023-01-03 ENCOUNTER — Other Ambulatory Visit: Payer: Self-pay

## 2023-01-03 ENCOUNTER — Encounter (HOSPITAL_COMMUNITY)
Admission: RE | Disposition: A | Discharge status: Home / Self Care | Payer: Self-pay | Source: Home / Self Care | Attending: Internal Medicine

## 2023-01-03 DIAGNOSIS — I110 Hypertensive heart disease with heart failure: Secondary | ICD-10-CM | POA: Insufficient documentation

## 2023-01-03 DIAGNOSIS — I447 Left bundle-branch block, unspecified: Secondary | ICD-10-CM | POA: Insufficient documentation

## 2023-01-03 DIAGNOSIS — I5022 Chronic systolic (congestive) heart failure: Secondary | ICD-10-CM | POA: Diagnosis not present

## 2023-01-03 DIAGNOSIS — Z95810 Presence of automatic (implantable) cardiac defibrillator: Secondary | ICD-10-CM | POA: Diagnosis not present

## 2023-01-03 DIAGNOSIS — I428 Other cardiomyopathies: Secondary | ICD-10-CM | POA: Diagnosis not present

## 2023-01-03 HISTORY — PX: BIV ICD INSERTION CRT-D: EP1195

## 2023-01-03 SURGERY — BIV ICD INSERTION CRT-D

## 2023-01-03 MED ORDER — IOHEXOL 350 MG/ML SOLN
INTRAVENOUS | Status: DC | PRN
  Administered 2023-01-03: 5 mL

## 2023-01-03 MED ORDER — CEFAZOLIN SODIUM-DEXTROSE 2-4 GM/100ML-% IV SOLN
INTRAVENOUS | Status: AC
  Filled 2023-01-03: qty 100

## 2023-01-03 MED ORDER — POVIDONE-IODINE 10 % EX SWAB
2.0000 | Freq: Once | CUTANEOUS | Status: AC
  Administered 2023-01-03: 2 via TOPICAL

## 2023-01-03 MED ORDER — HEPARIN (PORCINE) IN NACL 1000-0.9 UT/500ML-% IV SOLN
INTRAVENOUS | Status: DC | PRN
  Administered 2023-01-03: 500 mL

## 2023-01-03 MED ORDER — CEFAZOLIN SODIUM-DEXTROSE 2-4 GM/100ML-% IV SOLN
2.0000 g | INTRAVENOUS | Status: AC
  Administered 2023-01-03: 2 g via INTRAVENOUS

## 2023-01-03 MED ORDER — FENTANYL CITRATE (PF) 100 MCG/2ML IJ SOLN
INTRAMUSCULAR | Status: DC | PRN
  Administered 2023-01-03 (×2): 12.5 ug via INTRAVENOUS
  Administered 2023-01-03: 25 ug via INTRAVENOUS
  Administered 2023-01-03: 12.5 ug via INTRAVENOUS

## 2023-01-03 MED ORDER — ACETAMINOPHEN 325 MG PO TABS
325.0000 mg | ORAL_TABLET | ORAL | Status: DC | PRN
  Administered 2023-01-03: 650 mg via ORAL
  Filled 2023-01-03: qty 2

## 2023-01-03 MED ORDER — SODIUM CHLORIDE 0.9 % IV SOLN
INTRAVENOUS | Status: AC
  Filled 2023-01-03: qty 2

## 2023-01-03 MED ORDER — SODIUM CHLORIDE 0.9 % IV SOLN: INTRAVENOUS | Status: DC

## 2023-01-03 MED ORDER — SODIUM CHLORIDE 0.9 % IV SOLN: 80.0000 mg | INTRAVENOUS | Status: DC

## 2023-01-03 MED ORDER — LIDOCAINE HCL (PF) 1 % IJ SOLN
INTRAMUSCULAR | Status: DC | PRN
  Administered 2023-01-03: 60 mL

## 2023-01-03 MED ORDER — MIDAZOLAM HCL 5 MG/5ML IJ SOLN
INTRAMUSCULAR | Status: AC
  Filled 2023-01-03: qty 5

## 2023-01-03 MED ORDER — FENTANYL CITRATE (PF) 100 MCG/2ML IJ SOLN
INTRAMUSCULAR | Status: AC
  Filled 2023-01-03: qty 2

## 2023-01-03 MED ORDER — ONDANSETRON HCL 4 MG/2ML IJ SOLN: 4.0000 mg | Freq: Four times a day (QID) | INTRAMUSCULAR | Status: DC | PRN

## 2023-01-03 MED ORDER — MIDAZOLAM HCL 5 MG/5ML IJ SOLN
INTRAMUSCULAR | Status: DC | PRN
  Administered 2023-01-03: 1 mg via INTRAVENOUS
  Administered 2023-01-03: 2 mg via INTRAVENOUS
  Administered 2023-01-03 (×2): 1 mg via INTRAVENOUS

## 2023-01-03 SURGICAL SUPPLY — 18 items
CABLE SURGICAL S-101-97-12 (CABLE) ×1 IMPLANT
CATH ATTAIN COMMAND 6250-MB2 (CATHETERS) IMPLANT
CATH ATTAIN SEL SURV 6248V-130 (CATHETERS) IMPLANT
CATH ATTAIN SELECT 6238TEL (CATHETERS) IMPLANT
CATH JOSEPH QUAD ALLRED 6F REP (CATHETERS) IMPLANT
ICD COBALT XT QUAD CRT DTPA2Q1 (ICD Generator) IMPLANT
KIT ESSENTIALS PG (KITS) IMPLANT
LEAD ATTAIN PERFORMA S 4598-88 (Lead) IMPLANT
LEAD CAPSURE NOVUS 5076-52CM (Lead) IMPLANT
LEAD SPRINT QUAT SEC 6935-65CM (Lead) IMPLANT
PAD DEFIB RADIO PHYSIO CONN (PAD) ×1 IMPLANT
SHEATH 7FR PRELUDE SNAP 13 (SHEATH) IMPLANT
SHEATH 9FR PRELUDE SNAP 13 (SHEATH) IMPLANT
SLITTER 6232ADJ (MISCELLANEOUS) IMPLANT
TRAY PACEMAKER INSERTION (PACKS) ×1 IMPLANT
WIRE ACUITY WHISPER EDS 4648 (WIRE) IMPLANT
WIRE ASAHI SION 190X3X12 .014 (WIRE) IMPLANT
WIRE MAILMAN 182CM (WIRE) IMPLANT

## 2023-01-03 NOTE — Interval H&P Note (Signed)
History and Physical Interval Note:  01/03/2023 2:04 PM  [name]  has presented today for surgery, with the diagnosis of non ischemic cardio myopathy.  The various methods of treatment have been discussed with the patient and family. After consideration of risks, benefits and other options for treatment, the patient has consented to  Procedure(s): BIV ICD INSERTION CRT-D (N/A) as a surgical intervention.  The patient's history has been reviewed, patient examined, no change in status, stable for surgery.  I have reviewed the patient's chart and labs.  Questions were answered to the patient's satisfaction.     [name]

## 2023-01-03 NOTE — Discharge Instructions (Signed)
After Your ICD (Implantable Cardiac Defibrillator)   You have a [employer] ICD  ACTIVITY Do not lift your arm above shoulder height for 1 week after your procedure. After 7 days, you may progress as below.  You should remove your sling 24 hours after your procedure, unless otherwise instructed by your provider.     Friday January 10, 2023  Saturday January 11, 2023 Sunday January 12, 2023 Monday January 13, 2023   Do not lift, push, pull, or carry anything over 10 pounds with the affected arm until 6 weeks (Friday February 14, 2023 ) after your procedure.   You may drive AFTER your wound check, unless you have been told otherwise by your provider.   Ask your healthcare provider when you can go back to work   INCISION/Dressing If you are on a blood thinner such as Coumadin, Xarelto, Eliquis, Plavix, or Pradaxa please confirm with your provider when this should be resumed.   If large square, outer bandage is left in place, this can be removed after 24 hours from your procedure. Do not remove steri-strips or glue as below.   Monitor your defibrillator site for redness, swelling, and drainage. Call the device clinic at [phone] if you experience these symptoms or fever/chills.  If your incision is sealed with Steri-strips or staples, you may shower 7 days after your procedure or when told by your provider. Do not remove the steri-strips or let the shower hit directly on your site. You may wash around your site with soap and water.    If you were discharged in a sling, please do not wear this during the day more than 48 hours after your surgery unless otherwise instructed. This may increase the risk of stiffness and soreness in your shoulder.   Avoid lotions, ointments, or perfumes over your incision until it is well-healed.  You may use a hot tub or a pool AFTER your wound check appointment if the incision is completely closed.  Your ICD is designed to protect you from life threatening heart  rhythms. Because of this, you may receive a shock.   1 shock with no symptoms:  Call the office during business hours. 1 shock with symptoms (chest pain, chest pressure, dizziness, lightheadedness, shortness of breath, overall feeling unwell):  Call 911. If you experience 2 or more shocks in 24 hours:  Call 911. If you receive a shock, you should not drive for 6 months per the [place] DMV IF you receive appropriate therapy from your ICD.   ICD Alerts:  Some alerts are vibratory and others beep. These are NOT emergencies. Please call our office to let us know. If this occurs at night or on weekends, it can wait until the next business day. Send a remote transmission.  If your device is capable of reading fluid status (for heart failure), you will be offered monthly monitoring to review this with you.   DEVICE MANAGEMENT Remote monitoring is used to monitor your ICD from home. This monitoring is scheduled every 91 days by our office. It allows [place] to keep an eye on the functioning of your device to ensure it is working properly. You will routinely see your Electrophysiologist annually (more often if necessary).   You should receive your ID card for your new device in 4-8 weeks. Keep this card with you at all times once received. Consider wearing a medical alert bracelet or necklace.  Your ICD  may be MRI compatible. This will be discussed at your next  office visit/wound check.  You should avoid contact with strong electric or magnetic fields.   Do not use amateur (ham) radio equipment or electric (arc) welding torches. MP3 player headphones with magnets should not be used. Some devices are safe to use if held at least 12 inches (30 cm) from your defibrillator. These include power tools, lawn mowers, and speakers. If you are unsure if something is safe to use, ask your health care provider.  When using your cell phone, hold it to the ear that is on the opposite side from the defibrillator. Do not leave  your cell phone in a pocket over the defibrillator.  You may safely use electric blankets, heating pads, computers, and microwave ovens.  Call the office right away if: You have chest pain. You feel more than one shock. You feel more short of breath than you have felt before. You feel more light-headed than you have felt before. Your incision starts to open up.  This information is not intended to replace advice given to you by your health care provider. Make sure you discuss any questions you have with your health care provider.

## 2023-01-06 ENCOUNTER — Encounter (HOSPITAL_COMMUNITY): Payer: Self-pay | Admitting: Internal Medicine

## 2023-01-06 ENCOUNTER — Telehealth: Payer: Self-pay | Admitting: Cardiology

## 2023-01-06 NOTE — Telephone Encounter (Signed)
    Primary Cardiologist: Minus Breeding, MD  Chart reviewed as part of pre-operative protocol coverage. Simple dental extractions are considered low risk procedures per guidelines and generally do not require any specific cardiac clearance. It is also generally accepted that for simple extractions and dental cleanings, there is no need to interrupt blood thinner therapy.   SBE prophylaxis is not required for the patient.  Patient's aspirin is not prescribed by a cardiology provider.  Recommendations for holding aspirin will need to come from the prescribing provider.  I will route this recommendation to the requesting party via Epic fax function and remove from pre-op pool.  Please call with questions.  Deberah Pelton, NP 01/06/2023, 11:45 AM

## 2023-01-06 NOTE — Telephone Encounter (Signed)
   Pre-operative Risk Assessment    Patient Name: [name]  DOB: [birth year] MRN: [id number]     Request for Surgical Clearance    Procedure:   Deep Cleaning  Date of Surgery:  Clearance 01/06/23                                 Surgeon:  Dr. Noah Charon Surgeon's Group or Practice Name: [facility] Phone number:  [phone] Fax number:  [phone]   Type of Clearance Requested:   - Medical  - Pharmacy:  Hold Aspirin TBD by Cardiologist   Type of Anesthesia:  Not Indicated   Additional requests/questions:  Please fax a copy of medical clearance to the surgeon's office.  [name]   01/06/2023, 11:29 AM

## 2023-01-06 NOTE — Telephone Encounter (Signed)
Notes have been faxed to requesting office. Please see notes from Coletta Memos, [place].

## 2023-01-09 ENCOUNTER — Telehealth: Payer: Self-pay | Admitting: Internal Medicine

## 2023-01-09 NOTE — Telephone Encounter (Signed)
New Message:     Patient wants to know when can she start back driving?

## 2023-01-09 NOTE — Telephone Encounter (Signed)
Pt called per message received from [facility] triage.    Pt advised Dr. Lovena Le not in the office, but will send him a message, obtain an answer, and follow up with her, to let her know when she can start driving again.    I consulted Bing Neighbors of our Device Team.  Pt has wound check appointment on 01/15/2023.  Driving clearance will be assessed / granted at that time.  Pt called back and made aware of this.   Pt appreciated Sonia Baller RN answer, and was communicated to Pt. No other follow up required at this time.

## 2023-01-15 ENCOUNTER — Ambulatory Visit: Payer: BC Managed Care – PPO | Attending: Cardiovascular Disease

## 2023-01-15 DIAGNOSIS — I5022 Chronic systolic (congestive) heart failure: Secondary | ICD-10-CM

## 2023-01-15 LAB — CUP PACEART INCLINIC DEVICE CHECK
Date Time Interrogation Session: 20240313110842
Implantable Lead Connection Status: 753985
Implantable Lead Connection Status: 753985
Implantable Lead Connection Status: 753985
Implantable Lead Implant Date: 20240301
Implantable Lead Implant Date: 20240301
Implantable Lead Implant Date: 20240301
Implantable Lead Location: 753858
Implantable Lead Location: 753859
Implantable Lead Location: 753860
Implantable Lead Model: 4598
Implantable Lead Model: 5076
Implantable Lead Model: 6935
Implantable Pulse Generator Implant Date: 20240301
Lead Channel Pacing Threshold Amplitude: 1 V
Lead Channel Pacing Threshold Amplitude: 1 V
Lead Channel Pacing Threshold Amplitude: 1.5 V
Lead Channel Pacing Threshold Pulse Width: 0.4 ms
Lead Channel Pacing Threshold Pulse Width: 0.4 ms
Lead Channel Pacing Threshold Pulse Width: 0.4 ms

## 2023-01-15 NOTE — Patient Instructions (Signed)

## 2023-01-15 NOTE — Progress Notes (Signed)
Wound check appointment. Steri-strips removed. Wound without redness or edema. Incision edges approximated-small superficial open area noted midline.  Will have Pt wash well BID with soap and water and monitor.  Instructed to call device clinic if any concerns in next 2 days.  Normal device function. Thresholds, sensing, and impedances consistent with implant measurements. Device programmed at 3.5V for extra safety margin until 3 month visit. Histogram distribution appropriate for patient and level of activity. No mode switches or ventricular arrhythmias noted. Patient educated about wound care, arm mobility, lifting restrictions, shock plan. ROV in 3 months with implanting physician.

## 2023-01-20 ENCOUNTER — Ambulatory Visit
Admission: RE | Admit: 2023-01-20 | Discharge: 2023-01-20 | Disposition: A | Discharge status: Home / Self Care | Payer: BC Managed Care – PPO | Source: Ambulatory Visit | Attending: Nephrology | Admitting: Nephrology

## 2023-01-20 ENCOUNTER — Telehealth: Payer: Self-pay

## 2023-01-20 DIAGNOSIS — N184 Chronic kidney disease, stage 4 (severe): Secondary | ICD-10-CM | POA: Diagnosis not present

## 2023-01-20 NOTE — Telephone Encounter (Signed)
Following alert received from [employer] received for Device alert for AF ongoing from 3/18, poor rate control Burden 0.9%, no OAC, ASA only. Route to triage.  Patient advised AF seen in ICD and recommended AF clinic referral to discuss [place]. Patient agreeable. Denies any symptoms and unaware her heart was out of rhythm. Advised someone will call from Af clinic to advise about apt info.

## 2023-02-04 ENCOUNTER — Ambulatory Visit (HOSPITAL_COMMUNITY)
Admission: RE | Admit: 2023-02-04 | Discharge: 2023-02-04 | Disposition: A | Discharge status: Home / Self Care | Payer: BC Managed Care – PPO | Source: Ambulatory Visit | Attending: Internal Medicine | Admitting: Internal Medicine

## 2023-02-04 VITALS — BP 134/86 | HR 84 | Ht 65.0 in | Wt 187.4 lb

## 2023-02-04 DIAGNOSIS — Z95810 Presence of automatic (implantable) cardiac defibrillator: Secondary | ICD-10-CM | POA: Diagnosis not present

## 2023-02-04 DIAGNOSIS — I509 Heart failure, unspecified: Secondary | ICD-10-CM | POA: Diagnosis not present

## 2023-02-04 DIAGNOSIS — Z7901 Long term (current) use of anticoagulants: Secondary | ICD-10-CM | POA: Diagnosis not present

## 2023-02-04 DIAGNOSIS — I130 Hypertensive heart and chronic kidney disease with heart failure and stage 1 through stage 4 chronic kidney disease, or unspecified chronic kidney disease: Secondary | ICD-10-CM | POA: Insufficient documentation

## 2023-02-04 DIAGNOSIS — Z8249 Family history of ischemic heart disease and other diseases of the circulatory system: Secondary | ICD-10-CM | POA: Insufficient documentation

## 2023-02-04 DIAGNOSIS — Z79899 Other long term (current) drug therapy: Secondary | ICD-10-CM | POA: Diagnosis not present

## 2023-02-04 DIAGNOSIS — N1832 Chronic kidney disease, stage 3b: Secondary | ICD-10-CM | POA: Insufficient documentation

## 2023-02-04 DIAGNOSIS — Z6831 Body mass index (BMI) 31.0-31.9, adult: Secondary | ICD-10-CM | POA: Diagnosis not present

## 2023-02-04 DIAGNOSIS — E669 Obesity, unspecified: Secondary | ICD-10-CM | POA: Diagnosis not present

## 2023-02-04 DIAGNOSIS — Z8673 Personal history of transient ischemic attack (TIA), and cerebral infarction without residual deficits: Secondary | ICD-10-CM | POA: Diagnosis not present

## 2023-02-04 DIAGNOSIS — I447 Left bundle-branch block, unspecified: Secondary | ICD-10-CM | POA: Insufficient documentation

## 2023-02-04 DIAGNOSIS — I480 Paroxysmal atrial fibrillation: Secondary | ICD-10-CM | POA: Diagnosis not present

## 2023-02-04 DIAGNOSIS — D6869 Other thrombophilia: Secondary | ICD-10-CM

## 2023-02-04 MED ORDER — APIXABAN 5 MG PO TABS: 5.0000 mg | ORAL_TABLET | Freq: Two times a day (BID) | ORAL | 6 refills | Status: DC

## 2023-02-04 MED ORDER — FUROSEMIDE 20 MG PO TABS: 20.0000 mg | ORAL_TABLET | Freq: Every day | ORAL | Status: DC | PRN

## 2023-02-04 NOTE — Progress Notes (Signed)
Primary Care Physician: Velna Hatchet, MD Primary Cardiologist: Dr. Percival Spanish Primary Electrophysiologist: Dr. Lovena Le Referring Physician: Dr. Roderic Scarce is a 62 y.o. female with a history of nonischemic cardiomyopathy with LBBB, class III heart failure, s/p biventricular ICD implantation on 01/03/23, EF 20%, chronic kidney disease stage IIIb, acute ischemic stroke 12/18/21, and HTN who presents for consultation in the [facility]. The patient was initially diagnosed with atrial fibrillation on 01/20/23 after remote check on device alerted for AF. She denied any symptoms and could not feel she was in Afib. Patient is on ASA 81 mg daily due to history of CVA. She has a CHADS2VASC score of 5.  On evaluation today, she is doing well s/p ICD implantation on 01/03/23. She did not know she was in Afib on 3/18 and was surprised when she was called alerting her of the abnormal rhythm. She denies any shortness of breath or tired more than baseline.   She has been taking ASA 81 mg daily. No bleeding concerns.  Today, she denies symptoms of palpitations, chest pain, orthopnea, PND, lower extremity edema, dizziness, presyncope, syncope, snoring, daytime somnolence, bleeding, or neurologic sequela. The patient is tolerating medications without difficulties and is otherwise without complaint today.   Atrial Fibrillation Risk Factors:  she does not have symptoms or diagnosis of sleep apnea. she does not have a history of rheumatic fever. she does not have a history of alcohol use. The patient does not have a history of early familial atrial fibrillation or other arrhythmias.  she has a BMI of Body mass index is 31.18 kg/m.Marland Kitchen Filed Weights   02/04/23 0829  Weight: 85 kg    Family History  Problem Relation Age of Onset   Cerebral aneurysm Mother 45       ruptured   Hypertension Mother        severe   Heart attack Father 28   Coronary artery disease Other     Hypertension Brother        severe   Colon cancer Neg Hx    Breast cancer Neg Hx     Atrial Fibrillation Management history:  Previous antiarrhythmic drugs: None Previous cardioversions: None Previous ablations: None Anticoagulation history: Plan to start Eliquis 5 mg BID today   Past Medical History:  Diagnosis Date   CHF (congestive heart failure) (HCC)    Hypertension    Overweight(278.02)    Sciatica    Secondary cardiomyopathy, unspecified    EF now 50%   Past Surgical History:  Procedure Laterality Date   ABDOMINAL HYSTERECTOMY     BIV ICD INSERTION CRT-D N/A 01/03/2023   Procedure: BIV ICD INSERTION CRT-D;  Surgeon: Evans Lance, MD;  Location: [facility];  Service: Cardiovascular;  Laterality: N/A;    Current Outpatient Medications  Medication Sig Dispense Refill   Acetaminophen (TYLENOL PO) Take 500 mg by mouth as needed.     apixaban (ELIQUIS) 5 MG TABS tablet Take 1 tablet (5 mg total) by mouth 2 (two) times daily. 60 tablet 6   atorvastatin (LIPITOR) 80 MG tablet Take 1 tablet (80 mg total) by mouth daily. 30 tablet 1   carvedilol (COREG) 25 MG tablet Take 1 tablet (25 mg total) by mouth 2 (two) times daily with a meal. 180 tablet 3   hydrALAZINE (APRESOLINE) 25 MG tablet TAKE 1 TABLET(25 MG) BY MOUTH THREE TIMES DAILY 270 tablet 3   isosorbide mononitrate (IMDUR) 120 MG 24  hr tablet Take 1 tablet (120 mg total) by mouth daily. 90 tablet 3   spironolactone (ALDACTONE) 25 MG tablet Take 25 mg by mouth daily.     zolpidem (AMBIEN) 5 MG tablet Take 5 mg by mouth at bedtime as needed for sleep.  1   furosemide (LASIX) 20 MG tablet Take 1 tablet (20 mg total) by mouth daily as needed for fluid or edema.     No current facility-administered medications for this encounter.    Allergies  Allergen Reactions   Latex Itching   Chlorhexidine Gluconate Hives    Unknown if this is the cause of the hives, raised areas around lab draw sites. No tape and area  wiped with chlorhexidine.    Social History   Socioeconomic History   Marital status: Single    Spouse name: Not on file   Number of children: Not on file   Years of education: Not on file   Highest education level: Not on file  Occupational History   Not on file  Tobacco Use   Smoking status: Never   Smokeless tobacco: Never  Substance and Sexual Activity   Alcohol use: Yes    Alcohol/week: 7.0 - 14.0 standard drinks of alcohol    Types: 7 - 14 Cans of beer per week    Comment: social-daily beers   Drug use: No   Sexual activity: Yes    Birth control/protection: Surgical  Other Topics Concern   Not on file  Social History Narrative   Not on file   Social Determinants of Health   Financial Resource Strain: Not on file  Food Insecurity: Not on file  Transportation Needs: Not on file  Physical Activity: Not on file  Stress: Not on file  Social Connections: Not on file  Intimate Partner Violence: Not on file    ROS- All systems are reviewed and negative except as per the HPI above.  Physical Exam: Vitals:   02/04/23 0829  BP: 134/86  Pulse: 84  Weight: 85 kg  Height: 5\' 5"  (1.651 m)    GEN- The patient is a well appearing female, alert and oriented x 3 today.   Head- normocephalic, atraumatic Eyes-  Sclera clear, conjunctiva pink Ears- hearing intact Oropharynx- clear Neck- supple  Lungs- Clear to ausculation bilaterally, normal work of breathing Heart- Regular rate and rhythm, no murmurs, rubs or gallops  GI- soft, NT, ND, + BS Extremities- no clubbing, cyanosis, or edema MS- no significant deformity or atrophy Skin- no rash or lesion Psych- euthymic mood, full affect Neuro- strength and sensation are intact  Wt Readings from Last 3 Encounters:  02/04/23 85 kg  01/03/23 83.9 kg  12/13/22 83.8 kg    EKG today demonstrates:   AS/VP rhythm with PVC HR 84 PR 124 ms QRS 138 ms QT/Qtc 432/510 ms  Echo 11/14/22 demonstrated:  1. Left ventricular  ejection fraction, by estimation, is 20 to 25%. The  left ventricle has severely decreased function. The left ventricle  demonstrates global hypokinesis. The left ventricular internal cavity size  was severely dilated. Left ventricular  diastolic parameters are consistent with Grade II diastolic dysfunction  (pseudonormalization). Elevated left ventricular end-diastolic pressure.   2. Right ventricular systolic function is normal. The right ventricular  size is mildly enlarged.   3. Left atrial size was severely dilated.   4. The mitral valve is normal in structure. Mild to moderate mitral valve  regurgitation. No evidence of mitral stenosis.   5. The aortic  valve is normal in structure. Aortic valve regurgitation is  trivial. No aortic stenosis is present.   6. Aortic dilatation noted. There is mild dilatation of the ascending  aorta, measuring 38 mm.   7. The inferior vena cava is normal in size with greater than 50%  respiratory variability, suggesting right atrial pressure of 3 mmHg.   Epic records are reviewed at length today.  CHA2DS2-VASc Score = 5  The patient's score is based upon: CHF History: 1 HTN History: 1 Diabetes History: 0 Stroke History: 2 Vascular Disease History: 0 Age Score: 0 Gender Score: 1       ASSESSMENT AND PLAN: Paroxysmal Atrial Fibrillation (ICD10:  I48.0) The patient's CHA2DS2-VASc score is 5, indicating a 7.2% annual risk of stroke.    Education provided about Afib with visual diagram. Discussion about anticoagulation given her risk score for stroke in the setting of new diagnosis. We will postpone discussion on medication treatments and ablation pending reassessment of Afib burden. After discussion, we will proceed with conservative observation at this time.  Patient voices understanding of risk for stroke based on history and would like to begin anticoagulation.   Due to recent discovery, we will have her come back in 1 month to reassess AF  burden.  2. Secondary Hypercoagulable State (ICD10:  D68.69) The patient is at significant risk for stroke/thromboembolism based upon her CHA2DS2-VASc Score of 5.  Start Apixaban (Eliquis).   After discussion of risks vs benefits of anticoagulation for prevention of stroke secondary to atrial fibrillation, patient would like to begin medication for this.   We will stop ASA.   We will start Eliquis 5 mg BID and have her follow up in 1 month to check CBC and Bmet.  3. Obesity Body mass index is 31.18 kg/m. Lifestyle modification was discussed at length including regular exercise and weight reduction. Encouraged daily walking.  4. HTN Well controlled today.   Follow up in 1 month Afib clinic.    Emily Filbert, PA-C [facility] [address] [place], [place] [address] [phone] 02/04/2023 9:09 AM

## 2023-02-04 NOTE — Patient Instructions (Signed)
Discontinue Aspirin Start Eliquis 5 mg- Take 1 tablet by mouth twice daily

## 2023-02-12 ENCOUNTER — Telehealth: Payer: Self-pay | Admitting: Internal Medicine

## 2023-02-12 NOTE — Telephone Encounter (Signed)
Patient is requesting note be sent to her work releasing her back to work, off of light duty, on 04/13.  AttnErnest Haber Fax # [phone]

## 2023-02-12 NOTE — Telephone Encounter (Signed)
Opened in Error, will f/u with Dr. Ladona Ridgel

## 2023-02-13 NOTE — Telephone Encounter (Signed)
Faxed to [employer],  Fax [phone]

## 2023-02-26 ENCOUNTER — Telehealth: Payer: Self-pay | Admitting: Cardiology

## 2023-02-26 MED ORDER — CARVEDILOL 25 MG PO TABS: 25.0000 mg | ORAL_TABLET | Freq: Two times a day (BID) | ORAL | 3 refills | Status: DC

## 2023-02-26 NOTE — Telephone Encounter (Signed)
Called patient to confirm medication and pharmacy. Patient thanked me for calling and verbalized understanding. All questions (if any) were answered.

## 2023-02-26 NOTE — Telephone Encounter (Signed)
*  STAT* If patient is at the pharmacy, call can be transferred to refill team.   1. Which medications need to be refilled? (please list name of each medication and dose if known)  carvedilol (COREG) 25 MG tablet  2. Which pharmacy/location (including street and city if local pharmacy) is medication to be sent to? [id number] - [place], [place] - [address] AT [facility] OF [address]  3. Do they need a 30 day or 90 day supply?  90 day supply

## 2023-03-07 ENCOUNTER — Encounter (HOSPITAL_COMMUNITY): Payer: Self-pay | Admitting: Internal Medicine

## 2023-03-07 ENCOUNTER — Ambulatory Visit (HOSPITAL_COMMUNITY)
Admission: RE | Admit: 2023-03-07 | Discharge: 2023-03-07 | Disposition: A | Discharge status: Home / Self Care | Payer: BC Managed Care – PPO | Source: Ambulatory Visit | Attending: Internal Medicine | Admitting: Internal Medicine

## 2023-03-07 VITALS — BP 150/100 | HR 82 | Ht 65.0 in | Wt 185.2 lb

## 2023-03-07 DIAGNOSIS — Z7982 Long term (current) use of aspirin: Secondary | ICD-10-CM | POA: Insufficient documentation

## 2023-03-07 DIAGNOSIS — Z7901 Long term (current) use of anticoagulants: Secondary | ICD-10-CM | POA: Diagnosis not present

## 2023-03-07 DIAGNOSIS — I447 Left bundle-branch block, unspecified: Secondary | ICD-10-CM | POA: Insufficient documentation

## 2023-03-07 DIAGNOSIS — I129 Hypertensive chronic kidney disease with stage 1 through stage 4 chronic kidney disease, or unspecified chronic kidney disease: Secondary | ICD-10-CM | POA: Diagnosis not present

## 2023-03-07 DIAGNOSIS — Z8673 Personal history of transient ischemic attack (TIA), and cerebral infarction without residual deficits: Secondary | ICD-10-CM | POA: Diagnosis not present

## 2023-03-07 DIAGNOSIS — I428 Other cardiomyopathies: Secondary | ICD-10-CM | POA: Insufficient documentation

## 2023-03-07 DIAGNOSIS — E669 Obesity, unspecified: Secondary | ICD-10-CM | POA: Diagnosis not present

## 2023-03-07 DIAGNOSIS — N1832 Chronic kidney disease, stage 3b: Secondary | ICD-10-CM | POA: Diagnosis not present

## 2023-03-07 DIAGNOSIS — I480 Paroxysmal atrial fibrillation: Secondary | ICD-10-CM | POA: Insufficient documentation

## 2023-03-07 DIAGNOSIS — Z6830 Body mass index (BMI) 30.0-30.9, adult: Secondary | ICD-10-CM | POA: Insufficient documentation

## 2023-03-07 DIAGNOSIS — D6869 Other thrombophilia: Secondary | ICD-10-CM | POA: Diagnosis not present

## 2023-03-07 LAB — BASIC METABOLIC PANEL
Anion gap: 10 (ref 5–15)
BUN: 47 mg/dL — ABNORMAL HIGH (ref 6–20)
CO2: 17 mmol/L — ABNORMAL LOW (ref 22–32)
Calcium: 9 mg/dL (ref 8.9–10.3)
Chloride: 110 mmol/L (ref 98–111)
Creatinine, Ser: 2.26 mg/dL — ABNORMAL HIGH (ref 0.44–1.00)
GFR, Estimated: 24 mL/min — ABNORMAL LOW (ref >60)
Glucose, Bld: 87 mg/dL (ref 70–99)
Potassium: 4.8 mmol/L (ref 3.5–5.1)
Sodium: 137 mmol/L (ref 135–145)

## 2023-03-07 LAB — CBC
HCT: 34.2 % — ABNORMAL LOW (ref 36.0–46.0)
Hemoglobin: 10.8 g/dL — ABNORMAL LOW (ref 12.0–15.0)
MCH: 30.4 pg (ref 26.0–34.0)
MCHC: 31.6 g/dL (ref 30.0–36.0)
MCV: 96.3 fL (ref 80.0–100.0)
Platelets: 198 10*3/uL (ref 150–400)
RBC: 3.55 MIL/uL — ABNORMAL LOW (ref 3.87–5.11)
RDW: 14.6 % (ref 11.5–15.5)
WBC: 4 10*3/uL (ref 4.0–10.5)
nRBC: 0 % (ref 0.0–0.2)

## 2023-03-07 NOTE — Progress Notes (Signed)
Primary Care Physician: Alysia Penna, MD Primary Cardiologist: Dr. Antoine Poche Primary Electrophysiologist: Dr. Ladona Ridgel Referring Physician: Dr. Floyde Parkins is a 62 y.o. female with a history of nonischemic cardiomyopathy with LBBB, class III heart failure, s/p biventricular ICD implantation on 01/03/23, EF 20%, chronic kidney disease stage IIIb, acute ischemic stroke 12/18/21, and HTN who presents for consultation in the [facility]. The patient was initially diagnosed with atrial fibrillation on 01/20/23 after remote check on device alerted for AF. She denied any symptoms and could not feel she was in Afib. Patient is on ASA 81 mg daily due to history of CVA. She has a CHADS2VASC score of 5.  On evaluation 02/04/23, she is doing well s/p ICD implantation on 01/03/23. She did not know she was in Afib on 3/18 and was surprised when she was called alerting her of the abnormal rhythm. She denies any shortness of breath or tired more than baseline.   She has been taking ASA 81 mg daily. No bleeding concerns.  On follow up today 03/07/23, she is feeling well overall. She began Eliquis 5 mg BID at last OV and has been compliant; no bleeding concerns. She has energy and denies any symptoms such as palpitations, chest pain, fatigue, or shortness of breath.  Today, she denies symptoms of palpitations, chest pain, orthopnea, PND, lower extremity edema, dizziness, presyncope, syncope, snoring, daytime somnolence, bleeding, or neurologic sequela. The patient is tolerating medications without difficulties and is otherwise without complaint today.   Atrial Fibrillation Risk Factors:  she does not have symptoms or diagnosis of sleep apnea. she does not have a history of rheumatic fever. she does not have a history of alcohol use. The patient does not have a history of early familial atrial fibrillation or other arrhythmias.  she has a BMI of Body mass index is 30.82  kg/m.Marland Kitchen Filed Weights   03/07/23 0829  Weight: 84 kg    Family History  Problem Relation Age of Onset   Cerebral aneurysm Mother 65       ruptured   Hypertension Mother        severe   Heart attack Father 13   Coronary artery disease Other    Hypertension Brother        severe   Colon cancer Neg Hx    Breast cancer Neg Hx     Atrial Fibrillation Management history:  Previous antiarrhythmic drugs: None Previous cardioversions: None Previous ablations: None Anticoagulation history: Eliquis 5 mg BID   Past Medical History:  Diagnosis Date   CHF (congestive heart failure) (HCC)    Hypertension    Overweight(278.02)    Sciatica    Secondary cardiomyopathy, unspecified    EF now 50%   Past Surgical History:  Procedure Laterality Date   ABDOMINAL HYSTERECTOMY     BIV ICD INSERTION CRT-D N/A 01/03/2023   Procedure: BIV ICD INSERTION CRT-D;  Surgeon: Marinus Maw, MD;  Location: [facility];  Service: Cardiovascular;  Laterality: N/A;    Current Outpatient Medications  Medication Sig Dispense Refill   Acetaminophen (TYLENOL PO) Take 500 mg by mouth as needed.     apixaban (ELIQUIS) 5 MG TABS tablet Take 1 tablet (5 mg total) by mouth 2 (two) times daily. 60 tablet 6   atorvastatin (LIPITOR) 80 MG tablet Take 1 tablet (80 mg total) by mouth daily. 30 tablet 1   carvedilol (COREG) 25 MG tablet Take 1 tablet (25 mg  total) by mouth 2 (two) times daily with a meal. 180 tablet 3   furosemide (LASIX) 20 MG tablet Take 1 tablet (20 mg total) by mouth daily as needed for fluid or edema.     hydrALAZINE (APRESOLINE) 25 MG tablet TAKE 1 TABLET(25 MG) BY MOUTH THREE TIMES DAILY 270 tablet 3   isosorbide mononitrate (IMDUR) 120 MG 24 hr tablet Take 1 tablet (120 mg total) by mouth daily. 90 tablet 3   spironolactone (ALDACTONE) 25 MG tablet Take 25 mg by mouth daily.     zolpidem (AMBIEN) 5 MG tablet Take 5 mg by mouth at bedtime as needed for sleep.  1   No current  facility-administered medications for this encounter.    Allergies  Allergen Reactions   Latex Itching   Chlorhexidine Gluconate Hives    Unknown if this is the cause of the hives, raised areas around lab draw sites. No tape and area wiped with chlorhexidine.    Social History   Socioeconomic History   Marital status: Single    Spouse name: Not on file   Number of children: Not on file   Years of education: Not on file   Highest education level: Not on file  Occupational History   Not on file  Tobacco Use   Smoking status: Never   Smokeless tobacco: Never   Tobacco comments:    Never smoke 03/07/23  Substance and Sexual Activity   Alcohol use: Yes    Alcohol/week: 7.0 - 14.0 standard drinks of alcohol    Types: 7 - 14 Cans of beer per week    Comment: 1-2 beers daily 03/07/23   Drug use: No   Sexual activity: Yes    Birth control/protection: Surgical  Other Topics Concern   Not on file  Social History Narrative   Not on file   Social Determinants of Health   Financial Resource Strain: Not on file  Food Insecurity: Not on file  Transportation Needs: Not on file  Physical Activity: Not on file  Stress: Not on file  Social Connections: Not on file  Intimate Partner Violence: Not on file    ROS- All systems are reviewed and negative except as per the HPI above.  Physical Exam: Vitals:   03/07/23 0829  BP: (!) 150/100  Pulse: 82  Weight: 84 kg  Height: 5\' 5"  (1.651 m)    GEN- The patient is well appearing, alert and oriented x 3 today.   Head- normocephalic, atraumatic Eyes-  Sclera clear, conjunctiva pink Ears- hearing intact Oropharynx- clear Neck- supple, no JVP Lymph- no cervical lymphadenopathy Lungs- Clear to ausculation bilaterally, normal work of breathing Heart- Regular rate and rhythm, no murmurs, rubs or gallops, PMI not laterally displaced GI- soft, NT, ND, + BS Extremities- no clubbing, cyanosis, or edema MS- no significant deformity or  atrophy Skin- no rash or lesion Psych- euthymic mood, full affect Neuro- strength and sensation are intact   Wt Readings from Last 3 Encounters:  03/07/23 84 kg  02/04/23 85 kg  01/03/23 83.9 kg    EKG today demonstrates:   AS/VP rhythm  HR 82 PR 136 ms QRS 136 ms QT/Qtc 426/497 ms  Echo 11/14/22 demonstrated:  1. Left ventricular ejection fraction, by estimation, is 20 to 25%. The  left ventricle has severely decreased function. The left ventricle  demonstrates global hypokinesis. The left ventricular internal cavity size  was severely dilated. Left ventricular  diastolic parameters are consistent with Grade II diastolic dysfunction  (  pseudonormalization). Elevated left ventricular end-diastolic pressure.   2. Right ventricular systolic function is normal. The right ventricular  size is mildly enlarged.   3. Left atrial size was severely dilated.   4. The mitral valve is normal in structure. Mild to moderate mitral valve  regurgitation. No evidence of mitral stenosis.   5. The aortic valve is normal in structure. Aortic valve regurgitation is  trivial. No aortic stenosis is present.   6. Aortic dilatation noted. There is mild dilatation of the ascending  aorta, measuring 38 mm.   7. The inferior vena cava is normal in size with greater than 50%  respiratory variability, suggesting right atrial pressure of 3 mmHg.   Epic records are reviewed at length today.  CHA2DS2-VASc Score = 5  The patient's score is based upon: CHF History: 1 HTN History: 1 Diabetes History: 0 Stroke History: 2 Vascular Disease History: 0 Age Score: 0 Gender Score: 1       ASSESSMENT AND PLAN: Paroxysmal Atrial Fibrillation (ICD10:  I48.0) The patient's CHA2DS2-VASc score is 5, indicating a 7.2% annual risk of stroke.    She is in SR today.  Interrogation of [employer] ICD shows zero AF burden since last office visit which is great news. We will proceed with conservative observation at  this time.    2. Secondary Hypercoagulable State (ICD10:  D68.69) The patient is at significant risk for stroke/thromboembolism based upon her CHA2DS2-VASc Score of 5.  Start Apixaban (Eliquis).   After discussion of risks vs benefits of anticoagulation for prevention of stroke secondary to atrial fibrillation, patient would like to begin medication for this.   Continue Eliquis 5 mg BID without interruption.  3. Obesity Body mass index is 30.82 kg/m. Lifestyle modification was discussed at length including regular exercise and weight reduction. Encouraged daily walking.  4. HTN She ate [employer] potato chips this morning. We discussed reducing sodium in her diet.    Follow up in September in Afib clinic.   Lake Bells, PA-C [facility] [address] [place], [place] [address] [phone] 03/07/2023 8:45 AM

## 2023-03-12 NOTE — Progress Notes (Deleted)
  Cardiology Office Note:   Date:  03/12/2023  ID:  [name], DOB 1961/05/09, MRN [id number]  History of Present Illness:   [name] is a 62 y.o. female  who presents for follow up of hypertensive cardiomyopathy.  Her EF was at one point 30% but two years ago it was up to 50 - 55%.    She was in the hospital in Feb 2023.  She had a CVA.   There was an acute left MCA infarct.  EF was now found to be 20 - 25%.  MRI demonstrated no source of clot.   There was no evidence of previous MI or amyloid.  She had a negative screen for sleep apnea.  Sherryll Burger was held secondary to AKI.  She had beta blocker titrated.  She was Covid positive.   She had elevated creatinine after starting Farxiga we stopped this.   I repeated an echo recently and she was found to have an EF still at 30%.  She is not status post CRT.  She subsequently was found to have atrial fibrillation.  She has been followed in the atrial fib clinic.  ***   *** She is to have this on March 1st.     Since I last saw her she has done okay.  Her creatinine is actually down to 1.99 which is the best I seen in a while.  The patient denies any new symptoms such as chest discomfort, neck or arm discomfort. There has been no new shortness of breath, PND or orthopnea. There have been no reported palpitations, presyncope or syncope.   ROS: ***  Studies Reviewed:    EKG:  ***  ***  Risk Assessment/Calculations:   {Does this patient have ATRIAL FIBRILLATION?:[id number]} No BP recorded.  {Refresh Note OR Click here to enter BP  :1}***        Physical Exam:   VS:  There were no vitals taken for this visit.   Wt Readings from Last 3 Encounters:  03/07/23 185 lb 3.2 oz (84 kg)  02/04/23 187 lb 6.4 oz (85 kg)  01/03/23 185 lb (83.9 kg)     GEN: Well nourished, well developed in no acute distress NECK: No JVD; No carotid bruits CARDIAC: ***RRR, no murmurs, rubs, gallops RESPIRATORY:  Clear to auscultation without rales, wheezing or rhonchi   ABDOMEN: Soft, non-tender, non-distended EXTREMITIES:  No edema; No deformity   ASSESSMENT AND PLAN:   NONISCHEMIC CARDIOMYOPATHY:   ***  She seems to be doing well.  I am going to continue the meds as listed.  She is only taking Lasix about once a week and seems to be euvolemic.  She will get her device as above.   HTN:  ***  This is being managed in the context of treating his CHF    CKD:   Her last creatinine was ***  1.99 which is improved.  No change in therapy.     {Are you ordering a CV Procedure (e.g. stress test, cath, DCCV, TEE, etc)?   Press F2        :[id number]}   Signed, Rollene Rotunda, MD

## 2023-03-13 ENCOUNTER — Ambulatory Visit: Payer: BC Managed Care – PPO | Attending: Cardiology | Admitting: Cardiology

## 2023-03-13 DIAGNOSIS — I5022 Chronic systolic (congestive) heart failure: Secondary | ICD-10-CM

## 2023-03-13 DIAGNOSIS — I10 Essential (primary) hypertension: Secondary | ICD-10-CM

## 2023-03-13 DIAGNOSIS — N1832 Chronic kidney disease, stage 3b: Secondary | ICD-10-CM

## 2023-04-08 ENCOUNTER — Ambulatory Visit (INDEPENDENT_AMBULATORY_CARE_PROVIDER_SITE_OTHER): Payer: BC Managed Care – PPO

## 2023-04-08 DIAGNOSIS — I428 Other cardiomyopathies: Secondary | ICD-10-CM

## 2023-04-08 DIAGNOSIS — I5022 Chronic systolic (congestive) heart failure: Secondary | ICD-10-CM | POA: Diagnosis not present

## 2023-04-08 LAB — CUP PACEART REMOTE DEVICE CHECK
Battery Remaining Longevity: 85 mo
Battery Voltage: 3 V
Brady Statistic AP VP Percent: 0.08 %
Brady Statistic AP VS Percent: 0.02 %
Brady Statistic AS VP Percent: 97.77 %
Brady Statistic AS VS Percent: 2.14 %
Brady Statistic RA Percent Paced: 0.1 %
Brady Statistic RV Percent Paced: 1.59 %
Date Time Interrogation Session: 20240603190651
HighPow Impedance: 56 Ohm
Implantable Lead Connection Status: 753985
Implantable Lead Connection Status: 753985
Implantable Lead Connection Status: 753985
Implantable Lead Implant Date: 20240301
Implantable Lead Implant Date: 20240301
Implantable Lead Implant Date: 20240301
Implantable Lead Location: 753858
Implantable Lead Location: 753859
Implantable Lead Location: 753860
Implantable Lead Model: 4598
Implantable Lead Model: 5076
Implantable Lead Model: 6935
Implantable Pulse Generator Implant Date: 20240301
Lead Channel Impedance Value: 304 Ohm
Lead Channel Impedance Value: 380 Ohm
Lead Channel Impedance Value: 380 Ohm
Lead Channel Impedance Value: 380 Ohm
Lead Channel Impedance Value: 399 Ohm
Lead Channel Impedance Value: 437 Ohm
Lead Channel Impedance Value: 513 Ohm
Lead Channel Impedance Value: 532 Ohm
Lead Channel Impedance Value: 608 Ohm
Lead Channel Impedance Value: 627 Ohm
Lead Channel Impedance Value: 627 Ohm
Lead Channel Impedance Value: 665 Ohm
Lead Channel Impedance Value: 684 Ohm
Lead Channel Pacing Threshold Amplitude: 0.75 V
Lead Channel Pacing Threshold Amplitude: 1 V
Lead Channel Pacing Threshold Amplitude: 1.125 V
Lead Channel Pacing Threshold Pulse Width: 0.4 ms
Lead Channel Pacing Threshold Pulse Width: 0.4 ms
Lead Channel Pacing Threshold Pulse Width: 0.4 ms
Lead Channel Sensing Intrinsic Amplitude: 17.9 mV
Lead Channel Sensing Intrinsic Amplitude: 2.8 mV
Lead Channel Setting Pacing Amplitude: 1.5 V
Lead Channel Setting Pacing Amplitude: 2 V
Lead Channel Setting Pacing Amplitude: 3.25 V
Lead Channel Setting Pacing Pulse Width: 0.4 ms
Lead Channel Setting Pacing Pulse Width: 0.4 ms
Lead Channel Setting Sensing Sensitivity: 0.3 mV
Zone Setting Status: 755011
Zone Setting Status: 755011

## 2023-04-15 ENCOUNTER — Encounter: Payer: BC Managed Care – PPO | Admitting: Internal Medicine

## 2023-04-18 ENCOUNTER — Ambulatory Visit: Payer: BC Managed Care – PPO | Attending: Internal Medicine | Admitting: Internal Medicine

## 2023-04-18 ENCOUNTER — Encounter: Payer: Self-pay | Admitting: Internal Medicine

## 2023-04-18 VITALS — BP 138/96 | HR 90 | Ht 65.0 in | Wt 190.0 lb

## 2023-04-18 DIAGNOSIS — I5022 Chronic systolic (congestive) heart failure: Secondary | ICD-10-CM | POA: Diagnosis not present

## 2023-04-18 NOTE — Patient Instructions (Signed)
Medication Instructions:  Your physician recommends that you continue on your current medications as directed. Please refer to the Current Medication list given to you today.  *If you need a refill on your cardiac medications before your next appointment, please call your pharmacy*   Follow-Up: At [facility], you and your health needs are our priority.  As part of our continuing mission to provide you with exceptional heart care, we have created designated Provider Care Teams.  These Care Teams include your primary Cardiologist (physician) and Advanced Practice Providers (APPs -  Physician Assistants and Nurse Practitioners) who all work together to provide you with the care you need, when you need it.  Your next appointment:   1 year(s)  Provider:   You may see Gregg Taylor, MD or one of the following Advanced Practice Providers on your designated Care Team:   Renee Ursuy, PA-C Michael "Andy" Tillery, PA-C Suzann Riddle, NP    

## 2023-04-18 NOTE — Progress Notes (Signed)
HPI [name] returns for ongoing eval, s/p Biv ICD insertion. She is a pleasant 62 yo woman with a non-ischemic/HTN CM and EF of 20%. She has LBBB and class 2 CHF.  She underwent implant several months ago. She feels better.  Allergies  Allergen Reactions   Latex Itching   Chlorhexidine Gluconate Hives    Unknown if this is the cause of the hives, raised areas around lab draw sites. No tape and area wiped with chlorhexidine.     Current Outpatient Medications  Medication Sig Dispense Refill   Acetaminophen (TYLENOL PO) Take 500 mg by mouth as needed.     apixaban (ELIQUIS) 5 MG TABS tablet Take 1 tablet (5 mg total) by mouth 2 (two) times daily. 60 tablet 6   atorvastatin (LIPITOR) 80 MG tablet Take 1 tablet (80 mg total) by mouth daily. 30 tablet 1   carvedilol (COREG) 25 MG tablet Take 1 tablet (25 mg total) by mouth 2 (two) times daily with a meal. 180 tablet 3   furosemide (LASIX) 20 MG tablet Take 1 tablet (20 mg total) by mouth daily as needed for fluid or edema.     hydrALAZINE (APRESOLINE) 25 MG tablet TAKE 1 TABLET(25 MG) BY MOUTH THREE TIMES DAILY 270 tablet 3   isosorbide mononitrate (IMDUR) 120 MG 24 hr tablet Take 1 tablet (120 mg total) by mouth daily. 90 tablet 3   spironolactone (ALDACTONE) 25 MG tablet Take 25 mg by mouth daily.     zolpidem (AMBIEN) 5 MG tablet Take 5 mg by mouth at bedtime as needed for sleep.  1   No current facility-administered medications for this visit.     Past Medical History:  Diagnosis Date   CHF (congestive heart failure) (HCC)    Hypertension    Overweight(278.02)    Sciatica    Secondary cardiomyopathy, unspecified    EF now 50%    ROS:   All systems reviewed and negative except as noted in the HPI.   Past Surgical History:  Procedure Laterality Date   ABDOMINAL HYSTERECTOMY     BIV ICD INSERTION CRT-D N/A 01/03/2023   Procedure: BIV ICD INSERTION CRT-D;  Surgeon: Marinus Maw, MD;  Location: [facility] INVASIVE CV LAB;   Service: Cardiovascular;  Laterality: N/A;     Family History  Problem Relation Age of Onset   Cerebral aneurysm Mother 41       ruptured   Hypertension Mother        severe   Heart attack Father 26   Coronary artery disease Other    Hypertension Brother        severe   Colon cancer Neg Hx    Breast cancer Neg Hx      Social History   Socioeconomic History   Marital status: Single    Spouse name: Not on file   Number of children: Not on file   Years of education: Not on file   Highest education level: Not on file  Occupational History   Not on file  Tobacco Use   Smoking status: Never   Smokeless tobacco: Never   Tobacco comments:    Never smoke 03/07/23  Substance and Sexual Activity   Alcohol use: Yes    Alcohol/week: 7.0 - 14.0 standard drinks of alcohol    Types: 7 - 14 Cans of beer per week    Comment: 1-2 beers daily 03/07/23   Drug use: No   Sexual activity: Yes  Birth control/protection: Surgical  Other Topics Concern   Not on file  Social History Narrative   Not on file   Social Determinants of Health   Financial Resource Strain: Not on file  Food Insecurity: Not on file  Transportation Needs: Not on file  Physical Activity: Not on file  Stress: Not on file  Social Connections: Not on file  Intimate Partner Violence: Not on file     BP (!) 138/96   Pulse 90   Ht 5\' 5"  (1.651 m)   Wt 190 lb (86.2 kg)   SpO2 96%   BMI 31.62 kg/m   Physical Exam:  Well appearing NAD HEENT: Unremarkable Neck:  No JVD, no thyromegally Lymphatics:  No adenopathy Back:  No CVA tenderness Lungs:  Clear with no wheezes and well healed ICD incision. HEART:  Regular rate rhythm, no murmurs, no rubs, no clicks Abd:  soft, positive bowel sounds, no organomegally, no rebound, no guarding Ext:  2 plus pulses, no edema, no cyanosis, no clubbing Skin:  No rashes no nodules Neuro:  CN II through XII intact, motor grossly intact  EKG - NSR with biv  pacing  DEVICE  Normal device function.  See PaceArt for details.   Assess/Plan:  NICM/Chronic systolic heart failure, EF 20%, LBBB - She has undergone insertion of a biv ICD. She appears improved. She will undergo watchful waiting. HTN -her bp is up a little today. She has white coat HTN. I'll defer additional medical therapy to Dr. Davonna Belling.   [name] [name] Vorhees,MD

## 2023-04-21 NOTE — Addendum Note (Signed)
Addended by: Anselm Pancoast on: 04/21/2023 09:09 AM   Modules accepted: Orders

## 2023-04-23 ENCOUNTER — Encounter: Payer: Self-pay | Admitting: Internal Medicine

## 2023-04-25 DIAGNOSIS — D649 Anemia, unspecified: Secondary | ICD-10-CM | POA: Diagnosis not present

## 2023-04-25 DIAGNOSIS — E785 Hyperlipidemia, unspecified: Secondary | ICD-10-CM | POA: Diagnosis not present

## 2023-04-25 DIAGNOSIS — I10 Essential (primary) hypertension: Secondary | ICD-10-CM | POA: Diagnosis not present

## 2023-04-29 DIAGNOSIS — Z0000 Encounter for general adult medical examination without abnormal findings: Secondary | ICD-10-CM | POA: Diagnosis not present

## 2023-04-29 DIAGNOSIS — R82998 Other abnormal findings in urine: Secondary | ICD-10-CM | POA: Diagnosis not present

## 2023-04-29 DIAGNOSIS — Z1331 Encounter for screening for depression: Secondary | ICD-10-CM | POA: Diagnosis not present

## 2023-04-29 DIAGNOSIS — I130 Hypertensive heart and chronic kidney disease with heart failure and stage 1 through stage 4 chronic kidney disease, or unspecified chronic kidney disease: Secondary | ICD-10-CM | POA: Diagnosis not present

## 2023-04-29 DIAGNOSIS — E669 Obesity, unspecified: Secondary | ICD-10-CM | POA: Diagnosis not present

## 2023-05-01 NOTE — Progress Notes (Signed)
Remote ICD transmission.   

## 2023-07-08 ENCOUNTER — Ambulatory Visit (HOSPITAL_COMMUNITY): Payer: BC Managed Care – PPO | Admitting: Internal Medicine

## 2023-07-08 ENCOUNTER — Ambulatory Visit (INDEPENDENT_AMBULATORY_CARE_PROVIDER_SITE_OTHER): Payer: BC Managed Care – PPO

## 2023-07-08 ENCOUNTER — Encounter (HOSPITAL_COMMUNITY): Payer: Self-pay

## 2023-07-08 DIAGNOSIS — I5022 Chronic systolic (congestive) heart failure: Secondary | ICD-10-CM | POA: Diagnosis not present

## 2023-07-08 DIAGNOSIS — I428 Other cardiomyopathies: Secondary | ICD-10-CM

## 2023-07-08 LAB — CUP PACEART REMOTE DEVICE CHECK
Battery Remaining Longevity: 123 mo
Battery Voltage: 3.01 V
Brady Statistic RV Percent Paced: 1.93 %
Date Time Interrogation Session: 20240902234832
HighPow Impedance: 55 Ohm
Implantable Lead Connection Status: 753985
Implantable Lead Connection Status: 753985
Implantable Lead Connection Status: 753985
Implantable Lead Implant Date: 20240301
Implantable Lead Implant Date: 20240301
Implantable Lead Implant Date: 20240301
Implantable Lead Location: 753858
Implantable Lead Location: 753859
Implantable Lead Location: 753860
Implantable Lead Model: 4598
Implantable Lead Model: 5076
Implantable Lead Model: 6935
Implantable Pulse Generator Implant Date: 20240301
Lead Channel Impedance Value: 304 Ohm
Lead Channel Impedance Value: 342 Ohm
Lead Channel Impedance Value: 361 Ohm
Lead Channel Impedance Value: 361 Ohm
Lead Channel Impedance Value: 361 Ohm
Lead Channel Impedance Value: 418 Ohm
Lead Channel Impedance Value: 456 Ohm
Lead Channel Impedance Value: 551 Ohm
Lead Channel Impedance Value: 589 Ohm
Lead Channel Impedance Value: 589 Ohm
Lead Channel Impedance Value: 608 Ohm
Lead Channel Impedance Value: 627 Ohm
Lead Channel Impedance Value: 627 Ohm
Lead Channel Pacing Threshold Amplitude: 0.75 V
Lead Channel Pacing Threshold Amplitude: 0.75 V
Lead Channel Pacing Threshold Amplitude: 1 V
Lead Channel Pacing Threshold Pulse Width: 0.4 ms
Lead Channel Pacing Threshold Pulse Width: 0.4 ms
Lead Channel Pacing Threshold Pulse Width: 0.4 ms
Lead Channel Sensing Intrinsic Amplitude: 19.8 mV
Lead Channel Sensing Intrinsic Amplitude: 3.6 mV
Lead Channel Setting Pacing Amplitude: 1.5 V
Lead Channel Setting Pacing Amplitude: 1.5 V
Lead Channel Setting Pacing Amplitude: 2 V
Lead Channel Setting Pacing Pulse Width: 0.4 ms
Lead Channel Setting Pacing Pulse Width: 0.4 ms
Lead Channel Setting Sensing Sensitivity: 0.3 mV
Zone Setting Status: 755011
Zone Setting Status: 755011

## 2023-07-09 ENCOUNTER — Telehealth: Payer: Self-pay

## 2023-07-09 NOTE — Telephone Encounter (Signed)
Scheduled remote reviewed. Normal device function.   There were 3 NSVT arrhythmias detected and 2 were greater than 20 beats, episodes 19 and 20.  Sent to triage. Next remote 91 days.  ML, CVRS  Both NSVT's show 22-23 beats respectively of NSVT.

## 2023-07-17 NOTE — Progress Notes (Signed)
Remote ICD transmission.   

## 2023-08-15 ENCOUNTER — Other Ambulatory Visit: Payer: Self-pay | Admitting: Internal Medicine

## 2023-08-15 DIAGNOSIS — Z1231 Encounter for screening mammogram for malignant neoplasm of breast: Secondary | ICD-10-CM

## 2023-09-04 ENCOUNTER — Other Ambulatory Visit: Payer: Self-pay | Admitting: Cardiology

## 2023-09-13 ENCOUNTER — Other Ambulatory Visit: Payer: Self-pay | Admitting: Cardiology

## 2023-09-18 DIAGNOSIS — J45901 Unspecified asthma with (acute) exacerbation: Secondary | ICD-10-CM | POA: Diagnosis not present

## 2023-09-18 DIAGNOSIS — J302 Other seasonal allergic rhinitis: Secondary | ICD-10-CM | POA: Diagnosis not present

## 2023-09-29 ENCOUNTER — Ambulatory Visit: Payer: BC Managed Care – PPO

## 2023-10-06 DIAGNOSIS — J45901 Unspecified asthma with (acute) exacerbation: Secondary | ICD-10-CM | POA: Diagnosis not present

## 2023-10-06 DIAGNOSIS — R053 Chronic cough: Secondary | ICD-10-CM | POA: Diagnosis not present

## 2023-10-06 DIAGNOSIS — E785 Hyperlipidemia, unspecified: Secondary | ICD-10-CM | POA: Diagnosis not present

## 2023-10-06 LAB — LAB REPORT - SCANNED: EGFR: 20.1

## 2023-10-07 ENCOUNTER — Ambulatory Visit (INDEPENDENT_AMBULATORY_CARE_PROVIDER_SITE_OTHER): Payer: BC Managed Care – PPO

## 2023-10-07 DIAGNOSIS — I428 Other cardiomyopathies: Secondary | ICD-10-CM | POA: Diagnosis not present

## 2023-10-08 LAB — CUP PACEART REMOTE DEVICE CHECK
Battery Remaining Longevity: 120 mo
Battery Voltage: 3 V
Brady Statistic RV Percent Paced: 2.85 %
Date Time Interrogation Session: 20241203045741
HighPow Impedance: 65 Ohm
Implantable Lead Connection Status: 753985
Implantable Lead Connection Status: 753985
Implantable Lead Connection Status: 753985
Implantable Lead Implant Date: 20240301
Implantable Lead Implant Date: 20240301
Implantable Lead Implant Date: 20240301
Implantable Lead Location: 753858
Implantable Lead Location: 753859
Implantable Lead Location: 753860
Implantable Lead Model: 4598
Implantable Lead Model: 5076
Implantable Lead Model: 6935
Implantable Pulse Generator Implant Date: 20240301
Lead Channel Impedance Value: 304 Ohm
Lead Channel Impedance Value: 380 Ohm
Lead Channel Impedance Value: 399 Ohm
Lead Channel Impedance Value: 399 Ohm
Lead Channel Impedance Value: 399 Ohm
Lead Channel Impedance Value: 437 Ohm
Lead Channel Impedance Value: 494 Ohm
Lead Channel Impedance Value: 627 Ohm
Lead Channel Impedance Value: 646 Ohm
Lead Channel Impedance Value: 646 Ohm
Lead Channel Impedance Value: 665 Ohm
Lead Channel Impedance Value: 703 Ohm
Lead Channel Impedance Value: 703 Ohm
Lead Channel Pacing Threshold Amplitude: 0.625 V
Lead Channel Pacing Threshold Amplitude: 0.75 V
Lead Channel Pacing Threshold Amplitude: 0.875 V
Lead Channel Pacing Threshold Pulse Width: 0.4 ms
Lead Channel Pacing Threshold Pulse Width: 0.4 ms
Lead Channel Pacing Threshold Pulse Width: 0.4 ms
Lead Channel Sensing Intrinsic Amplitude: 11.5 mV
Lead Channel Sensing Intrinsic Amplitude: 3.5 mV
Lead Channel Setting Pacing Amplitude: 1.5 V
Lead Channel Setting Pacing Amplitude: 1.5 V
Lead Channel Setting Pacing Amplitude: 2 V
Lead Channel Setting Pacing Pulse Width: 0.4 ms
Lead Channel Setting Pacing Pulse Width: 0.4 ms
Lead Channel Setting Sensing Sensitivity: 0.3 mV
Zone Setting Status: 755011
Zone Setting Status: 755011

## 2023-10-30 ENCOUNTER — Ambulatory Visit
Admission: RE | Admit: 2023-10-30 | Discharge: 2023-10-30 | Disposition: A | Discharge status: Home / Self Care | Payer: BC Managed Care – PPO | Source: Ambulatory Visit | Attending: Internal Medicine | Admitting: Internal Medicine

## 2023-10-30 DIAGNOSIS — Z1231 Encounter for screening mammogram for malignant neoplasm of breast: Secondary | ICD-10-CM | POA: Diagnosis not present

## 2023-11-02 ENCOUNTER — Other Ambulatory Visit (HOSPITAL_COMMUNITY): Payer: Self-pay | Admitting: Internal Medicine

## 2023-11-23 NOTE — Progress Notes (Deleted)
Cardiology Office Note    Date:  11/23/2023  ID:  [name], DOB 09-14-1961, MRN [id number] PCP:  Alysia Penna, MD  Cardiologist:  Rollene Rotunda, MD  Electrophysiologist:  Lewayne Bunting, MD   Chief Complaint: Follow up for HF   History of Present Illness: .    [name] is a 63 y.o. female with visit-pertinent history of nonischemic cardiomyopathy, hypertension, stage IIIb CKD, paroxysmal atrial fibrillation on Eliquis, acute left CVA in 2023.  In 2015 she was noted to have EF 25 to 30%, recovered to 50 to 55% in 2018.  In 12/2021 she was admitted for an acute left CVA infarct.  Her follow-up echocardiogram showed an LVEF of 20 to 25%.  Her MRI did not demonstrate a clot source, there is no evidence of previous MI or amyloid.  She was negative for sleep apnea.  She was noted to be COVID-positive.  She was previously trialed on Farxiga but had elevated creatinine following this and this was discontinued.  Her last echocardiogram on 11/14/2022 indicated LVEF of 20 to 25%, LV demonstrating global hypokinesis, LV internal cavity size was severely dilated, G2 DD.  Given persistent reduction in EF she was referred to Dr. Ladona Ridgel for consideration of CRT.  She was also referred to nephrology given creatinine 3.0.  She was seen in [name] by Dr. Ladona Ridgel on 12/06/2022, patient wished to proceed with ICD insertion.  She was last seen by Dr. Antoine Poche on 12/13/2022, she remained stable from a cardiac standpoint.   On 01/03/2023 she underwent BiV ICD insertion with Dr. Ladona Ridgel.  On 01/20/2023 after a remote device check it was alerted that she had episodes of atrial fibrillation.  She was followed up with at the A-fib [name].  She was started on Eliquis 5 mg twice daily.  No medication changes were made, on her follow-up visit at 03/07/2023 her device interrogation showed an A-fib burden of 0%.  She was last seen in [name] by Dr. Ladona Ridgel on 04/18/2023, she remained stable from a cardiac standpoint.  Today she  presents for follow-up.  She reports that she  HFrEF/nonischemic cardiomyopathy: First noted to have reduced EF of 25 to 30% in 2015, recovered to 50 to 55% in 2018.  Found to have LVEF of 20 to 25% in 12/2021 following CVA.  MRI with no evidence of previous MI or amyloid.  She was negative for sleep apnea.  GDMT has been limited due to renal function.  In 01/2023 she underwent BiV ICD insertion with Dr. Ladona Ridgel. Today she reports She appears euvolemic and well compensated on exam.   PAF/ICD: On device interrogation found to have episodes of atrial fibrillation.  On Eliquis 5 mg twice daily, she denies any bleeding problems.  She is cardiac unaware of A-fib. Today she reports   HTN: Blood pressure today  Continue current antihypertensive regimen.   CKD: last creatinine    Labwork independently reviewed:   ROS: .   *** denies chest pain, shortness of breath, lower extremity edema, fatigue, palpitations, melena, hematuria, hemoptysis, diaphoresis, weakness, presyncope, syncope, orthopnea, and PND.  All other systems are reviewed and otherwise negative.  Studies Reviewed: Marland Kitchen    EKG:  EKG is ordered today, personally reviewed, demonstrating ***     CV Studies:  Cardiac Studies & Procedures      ECHOCARDIOGRAM  ECHOCARDIOGRAM COMPLETE 11/14/2022  Narrative ECHOCARDIOGRAM REPORT    Patient Name:   [name]   Date of Exam: 11/14/2022 Medical Rec #:  [id number]     Height:       65.0 in Accession #:    [id number]    Weight:       188.0 lb Date of Birth:  1961-07-05    BSA:          1.927 m Patient Age:    61 years      BP:           108/74 mmHg Patient Gender: F             HR:           87 bpm. Exam Location:  [address]  Procedure: 2D Echo, Cardiac Doppler, Color Doppler and Intracardiac Opacification Agent  Indications:    i42.8 Nonischemic cardiomyopathy  History:        Patient has prior history of Echocardiogram examinations, most recent 12/14/2021. CHF and  Cardiomyopathy; Risk Factors:Hypertension.  Sonographer:    Sedonia Small Rodgers-Jones RDCS Referring Phys: [id number] JAMES HOCHREIN  IMPRESSIONS   1. Left ventricular ejection fraction, by estimation, is 20 to 25%. The left ventricle has severely decreased function. The left ventricle demonstrates global hypokinesis. The left ventricular internal cavity size was severely dilated. Left ventricular diastolic parameters are consistent with Grade II diastolic dysfunction (pseudonormalization). Elevated left ventricular end-diastolic pressure. 2. Right ventricular systolic function is normal. The right ventricular size is mildly enlarged. 3. Left atrial size was severely dilated. 4. The mitral valve is normal in structure. Mild to moderate mitral valve regurgitation. No evidence of mitral stenosis. 5. The aortic valve is normal in structure. Aortic valve regurgitation is trivial. No aortic stenosis is present. 6. Aortic dilatation noted. There is mild dilatation of the ascending aorta, measuring 38 mm. 7. The inferior vena cava is normal in size with greater than 50% respiratory variability, suggesting right atrial pressure of 3 mmHg.  FINDINGS Left Ventricle: Left ventricular ejection fraction, by estimation, is 20 to 25%. The left ventricle has severely decreased function. The left ventricle demonstrates global hypokinesis. Definity contrast agent was given IV to delineate the left ventricular endocardial borders. The left ventricular internal cavity size was severely dilated. There is no left ventricular hypertrophy. Abnormal (paradoxical) septal motion, consistent with left bundle branch block. Left ventricular diastolic parameters are consistent with Grade II diastolic dysfunction (pseudonormalization). Elevated left ventricular end-diastolic pressure.  Right Ventricle: The right ventricular size is mildly enlarged. No increase in right ventricular wall thickness. Right ventricular systolic function  is normal.  Left Atrium: Left atrial size was severely dilated.  Right Atrium: Right atrial size was normal in size.  Pericardium: There is no evidence of pericardial effusion.  Mitral Valve: The mitral valve is normal in structure. Mild to moderate mitral valve regurgitation. No evidence of mitral valve stenosis.  Tricuspid Valve: The tricuspid valve is normal in structure. Tricuspid valve regurgitation is mild . No evidence of tricuspid stenosis.  Aortic Valve: The aortic valve is normal in structure. Aortic valve regurgitation is trivial. No aortic stenosis is present.  Pulmonic Valve: The pulmonic valve was normal in structure. Pulmonic valve regurgitation is mild. No evidence of pulmonic stenosis.  Aorta: Aortic dilatation noted. There is mild dilatation of the ascending aorta, measuring 38 mm.  Venous: The inferior vena cava is normal in size with greater than 50% respiratory variability, suggesting right atrial pressure of 3 mmHg.  IAS/Shunts: No atrial level shunt detected by color flow Doppler.   LEFT VENTRICLE PLAX 2D LVIDd:         6.40  cm   Diastology LVIDs:         5.50 cm   LV e' medial:    5.71 cm/s LV PW:         1.00 cm   LV E/e' medial:  18.9 LV IVS:        0.60 cm   LV e' lateral:   7.46 cm/s LVOT diam:     1.70 cm   LV E/e' lateral: 14.5 LV SV:         26 LV SV Index:   13 LVOT Area:     2.27 cm   RIGHT VENTRICLE             IVC RV Basal diam:  4.50 cm     IVC diam: 1.80 cm RV S prime:     14.60 cm/s TAPSE (M-mode): 2.3 cm  LEFT ATRIUM              Index        RIGHT ATRIUM           Index LA diam:        5.30 cm  2.75 cm/m   RA Area:     16.80 cm LA Vol (A2C):   123.0 ml 63.84 ml/m  RA Volume:   51.30 ml  26.62 ml/m LA Vol (A4C):   58.2 ml  30.21 ml/m LA Biplane Vol: 89.2 ml  46.29 ml/m AORTIC VALVE LVOT Vmax:   64.35 cm/s LVOT Vmean:  39.750 cm/s LVOT VTI:    0.113 m  AORTA Ao Root diam: 3.20 cm Ao Asc diam:  3.80 cm  MITRAL VALVE                 TRICUSPID VALVE MV Area (PHT): 5.38 cm     TR Peak grad:   32.9 mmHg MV Decel Time: 141 msec     TR Vmax:        287.00 cm/s MV E velocity: 108.00 cm/s MV A velocity: 112.00 cm/s  SHUNTS MV E/A ratio:  0.96         Systemic VTI:  0.11 m Systemic Diam: 1.70 cm  Armanda Magic MD Electronically signed by Armanda Magic MD Signature Date/Time: 11/14/2022/12:17:43 PM    Final     CARDIAC MRI  MR CARDIAC MORPHOLOGY W WO CONTRAST 12/17/2021  Narrative CLINICAL DATA:  Cardiomyopathy,?LV thrombus  EXAM: CARDIAC MRI  TECHNIQUE: The patient was scanned on a 1.5 Tesla GE magnet. A dedicated cardiac coil was used. Functional imaging was done using Fiesta sequences. 2,3, and 4 chamber views were done to assess for RWMA's. Modified Simpson's rule using a short axis stack was used to calculate an ejection fraction on a dedicated work [employer]. The patient received 10 cc of Gadavist. After 10 minutes inversion recovery sequences were used to assess for infiltration and scar tissue.  CONTRAST:  Gadavist 10 cc  FINDINGS: Limited images of the lung fields showed no gross abnormalities.  Moderate left ventricular dilation with normal wall thickness. Diffuse severe hypokinesis with septal-lateral dyssynchrony consistent with LBBB, LV EF 20%. No LV thrombus noted. Normal right ventricular size with low normal to mildly decreased systolic function, RV EF 44%. Mild left atrial enlargement. Normal right atrium. Trivial mitral regurgitation. Trileaflet aortic valve with no stenosis or regurgitation.  Delayed enhancement imaging: In one view, there was a small area of subendocardial late gadolinium enhancement (LGE) at the apex, but this could not be reproduced in other views so  may have been artifactual.  MEASUREMENTS: MEASUREMENTS LVEDV 306 mL LVSV 60 mL LVEF 20%  RVEDV 137 mL RVSV 60 mL RVEF 44%  T1 1129, ECV 31%  IMPRESSION: 1. Moderate LV  dilation with diffuse severe LV hypokinesis and prominent septal-lateral dyssynchrony consistent with LBBB. EF 20%.  2.  No LV thrombus.  3. Normal RV size with low normal to mildly decreased systolic function, EF 44%.  4. Possible very small area of subendocardial LGE at the apex but may be artifactual. Overall, no evidence for large prior MI, myocarditis, or infiltrative disease like amyloidosis.  5. Mildly elevated extracellular volume percentage in the septum, this is likely nonspecific.  With minimal to no LV scarring and prominent septal-lateral dyssynchrony, this patient would likely be good CRT candidate.  Dalton Mclean   Electronically Signed By: Marca Ancona M.D. On: 12/17/2021 15:31           Current Reported Medications:.    No outpatient medications have been marked as taking for the 11/25/23 encounter (Appointment) with Rip Harbour, NP.    Physical Exam:    VS:  There were no vitals taken for this visit.   Wt Readings from Last 3 Encounters:  04/18/23 190 lb (86.2 kg)  03/07/23 185 lb 3.2 oz (84 kg)  02/04/23 187 lb 6.4 oz (85 kg)    GEN: Well nourished, well developed in no acute distress NECK: No JVD; No carotid bruits CARDIAC: ***RRR, no murmurs, rubs, gallops RESPIRATORY:  Clear to auscultation without rales, wheezing or rhonchi  ABDOMEN: Soft, non-tender, non-distended EXTREMITIES:  No edema; No acute deformity   Asessement and Plan:.     ***     Disposition: F/u with ***  Signed, Rip Harbour, NP

## 2023-11-25 ENCOUNTER — Ambulatory Visit: Payer: BC Managed Care – PPO | Admitting: Cardiology

## 2023-11-25 DIAGNOSIS — I10 Essential (primary) hypertension: Secondary | ICD-10-CM

## 2023-11-25 DIAGNOSIS — I428 Other cardiomyopathies: Secondary | ICD-10-CM

## 2023-11-25 DIAGNOSIS — I5022 Chronic systolic (congestive) heart failure: Secondary | ICD-10-CM

## 2023-11-25 DIAGNOSIS — N1832 Chronic kidney disease, stage 3b: Secondary | ICD-10-CM

## 2023-11-25 DIAGNOSIS — I480 Paroxysmal atrial fibrillation: Secondary | ICD-10-CM

## 2023-12-14 NOTE — Progress Notes (Unsigned)
Cardiology Office Note    Date:  12/14/2023  ID:  CAMDEN MAZZAFERRO, DOB 08-Jun-1961, MRN [id number] PCP:  Alysia Penna, MD  Cardiologist:  Rollene Rotunda, MD  Electrophysiologist:  Lewayne Bunting, MD   Chief Complaint: Follow up for HF   History of Present Illness: .    [name] is a 63 y.o. female with visit-pertinent history of nonischemic cardiomyopathy, hypertension, stage IIIb CKD, paroxysmal atrial fibrillation on Eliquis, acute left CVA in 2023.  In 2015 she was noted to have EF 25 to 30%, recovered to 50 to 55% in 2018.  In 12/2021 she was admitted for an acute left CVA infarct.  Her follow-up echocardiogram showed an LVEF of 20 to 25%.  Her MRI did not demonstrate a clot source, there is no evidence of previous MI or amyloid.  She was negative for sleep apnea.  She was noted to be COVID-positive.  She was previously trialed on Farxiga but had elevated creatinine following this and this was discontinued.  Her last echocardiogram on 11/14/2022 indicated LVEF of 20 to 25%, LV demonstrating global hypokinesis, LV internal cavity size was severely dilated, G2 DD.  Given persistent reduction in EF she was referred to Dr. Ladona Ridgel for consideration of CRT.  She was also referred to nephrology given creatinine 3.0.  She was seen in clinic by Dr. Ladona Ridgel on 12/06/2022, patient wished to proceed with ICD insertion.  She was last seen by Dr. Antoine Poche on 12/13/2022, she remained stable from a cardiac standpoint.   On 01/03/2023 she underwent BiV ICD insertion with Dr. Ladona Ridgel.  On 01/20/2023 after a remote device check it was alerted that she had episodes of atrial fibrillation.  She was followed up with at the A-fib clinic.  She was started on Eliquis 5 mg twice daily.  No medication changes were made, on her follow-up visit at 03/07/2023 her device interrogation showed an A-fib burden of 0%.  She was last seen in clinic by Dr. Ladona Ridgel on 04/18/2023, she remained stable from a cardiac standpoint.  Today she presents  for follow-up.  She reports that she  HFrEF/nonischemic cardiomyopathy: First noted to have reduced EF of 25 to 30% in 2015, recovered to 50 to 55% in 2018.  Found to have LVEF of 20 to 25% in 12/2021 following CVA.  MRI with no evidence of previous MI or amyloid.  She was negative for sleep apnea.  GDMT has been limited due to renal function.  Echo on 11/14/2022 indicated LVEF of 20 to 25%, LV with global hypokinesis, internal cavity size was severely dilated, grade 2 diastolic dysfunction.  In 01/2023 she underwent BiV ICD insertion with Dr. Ladona Ridgel. Today she reports She appears euvolemic and well compensated on exam.  Continue carvedilol 25 mg twice daily, Lasix 20 mg daily, hydralazine 25 mg 3 times daily, Imdur 120 mg daily, spironolactone 25 mg daily.  PAF/ICD: On device interrogation found to have episodes of atrial fibrillation.  Last interrogation on 11/03/2023 indicated normal device function, she had 3 episodes of NSVT, 7-31 beats in duration, no therapy.  On Eliquis 5 mg twice daily, she denies any bleeding problems.  She is cardiac unaware of A-fib. Today she reports   HTN: Blood pressure today  Continue current antihypertensive regimen.   CKD: last creatinine    Labwork independently reviewed:   ROS: .   *** denies chest pain, shortness of breath, lower extremity edema, fatigue, palpitations, melena, hematuria, hemoptysis, diaphoresis, weakness, presyncope, syncope, orthopnea, and PND.  All other systems are reviewed and otherwise negative.  Studies Reviewed: Marland Kitchen    EKG:  EKG is ordered today, personally reviewed, demonstrating ***     CV Studies:  Cardiac Studies & Procedures      ECHOCARDIOGRAM  ECHOCARDIOGRAM COMPLETE 11/14/2022  Narrative ECHOCARDIOGRAM REPORT    Patient Name:   [name] [facility]   Date of Exam: 11/14/2022 Medical Rec #:  [id number]     Height:       65.0 in Accession #:    [id number]    Weight:       188.0 lb Date of Birth:  1960/11/05    BSA:           1.927 m Patient Age:    61 years      BP:           108/74 mmHg Patient Gender: F             HR:           87 bpm. Exam Location:  [address]  Procedure: 2D Echo, Cardiac Doppler, Color Doppler and Intracardiac Opacification Agent  Indications:    i42.8 Nonischemic cardiomyopathy  History:        Patient has prior history of Echocardiogram examinations, most recent 12/14/2021. CHF and Cardiomyopathy; Risk Factors:Hypertension.  Sonographer:    Sedonia Small Rodgers-Jones RDCS Referring Phys: [id number] JAMES HOCHREIN  IMPRESSIONS   1. Left ventricular ejection fraction, by estimation, is 20 to 25%. The left ventricle has severely decreased function. The left ventricle demonstrates global hypokinesis. The left ventricular internal cavity size was severely dilated. Left ventricular diastolic parameters are consistent with Grade II diastolic dysfunction (pseudonormalization). Elevated left ventricular end-diastolic pressure. 2. Right ventricular systolic function is normal. The right ventricular size is mildly enlarged. 3. Left atrial size was severely dilated. 4. The mitral valve is normal in structure. Mild to moderate mitral valve regurgitation. No evidence of mitral stenosis. 5. The aortic valve is normal in structure. Aortic valve regurgitation is trivial. No aortic stenosis is present. 6. Aortic dilatation noted. There is mild dilatation of the ascending aorta, measuring 38 mm. 7. The inferior vena cava is normal in size with greater than 50% respiratory variability, suggesting right atrial pressure of 3 mmHg.  FINDINGS Left Ventricle: Left ventricular ejection fraction, by estimation, is 20 to 25%. The left ventricle has severely decreased function. The left ventricle demonstrates global hypokinesis. Definity contrast agent was given IV to delineate the left ventricular endocardial borders. The left ventricular internal cavity size was severely dilated. There is no left ventricular  hypertrophy. Abnormal (paradoxical) septal motion, consistent with left bundle branch block. Left ventricular diastolic parameters are consistent with Grade II diastolic dysfunction (pseudonormalization). Elevated left ventricular end-diastolic pressure.  Right Ventricle: The right ventricular size is mildly enlarged. No increase in right ventricular wall thickness. Right ventricular systolic function is normal.  Left Atrium: Left atrial size was severely dilated.  Right Atrium: Right atrial size was normal in size.  Pericardium: There is no evidence of pericardial effusion.  Mitral Valve: The mitral valve is normal in structure. Mild to moderate mitral valve regurgitation. No evidence of mitral valve stenosis.  Tricuspid Valve: The tricuspid valve is normal in structure. Tricuspid valve regurgitation is mild . No evidence of tricuspid stenosis.  Aortic Valve: The aortic valve is normal in structure. Aortic valve regurgitation is trivial. No aortic stenosis is present.  Pulmonic Valve: The pulmonic valve was normal in structure. Pulmonic valve regurgitation is mild. No  evidence of pulmonic stenosis.  Aorta: Aortic dilatation noted. There is mild dilatation of the ascending aorta, measuring 38 mm.  Venous: The inferior vena cava is normal in size with greater than 50% respiratory variability, suggesting right atrial pressure of 3 mmHg.  IAS/Shunts: No atrial level shunt detected by color flow Doppler.   LEFT VENTRICLE PLAX 2D LVIDd:         6.40 cm   Diastology LVIDs:         5.50 cm   LV e' medial:    5.71 cm/s LV PW:         1.00 cm   LV E/e' medial:  18.9 LV IVS:        0.60 cm   LV e' lateral:   7.46 cm/s LVOT diam:     1.70 cm   LV E/e' lateral: 14.5 LV SV:         26 LV SV Index:   13 LVOT Area:     2.27 cm   RIGHT VENTRICLE             IVC RV Basal diam:  4.50 cm     IVC diam: 1.80 cm RV S prime:     14.60 cm/s TAPSE (M-mode): 2.3 cm  LEFT ATRIUM              Index         RIGHT ATRIUM           Index LA diam:        5.30 cm  2.75 cm/m   RA Area:     16.80 cm LA Vol (A2C):   123.0 ml 63.84 ml/m  RA Volume:   51.30 ml  26.62 ml/m LA Vol (A4C):   58.2 ml  30.21 ml/m LA Biplane Vol: 89.2 ml  46.29 ml/m AORTIC VALVE LVOT Vmax:   64.35 cm/s LVOT Vmean:  39.750 cm/s LVOT VTI:    0.113 m  AORTA Ao Root diam: 3.20 cm Ao Asc diam:  3.80 cm  MITRAL VALVE                TRICUSPID VALVE MV Area (PHT): 5.38 cm     TR Peak grad:   32.9 mmHg MV Decel Time: 141 msec     TR Vmax:        287.00 cm/s MV E velocity: 108.00 cm/s MV A velocity: 112.00 cm/s  SHUNTS MV E/A ratio:  0.96         Systemic VTI:  0.11 m Systemic Diam: 1.70 cm  Armanda Magic MD Electronically signed by Armanda Magic MD Signature Date/Time: 11/14/2022/12:17:43 PM    Final     CARDIAC MRI  MR CARDIAC MORPHOLOGY W WO CONTRAST 12/17/2021  Narrative CLINICAL DATA:  Cardiomyopathy,?LV thrombus  EXAM: CARDIAC MRI  TECHNIQUE: The patient was scanned on a 1.5 Tesla GE magnet. A dedicated cardiac coil was used. Functional imaging was done using Fiesta sequences. 2,3, and 4 chamber views were done to assess for RWMA's. Modified Simpson's rule using a short axis stack was used to calculate an ejection fraction on a dedicated work [employer]. The patient received 10 cc of Gadavist. After 10 minutes inversion recovery sequences were used to assess for infiltration and scar tissue.  CONTRAST:  Gadavist 10 cc  FINDINGS: Limited images of the lung fields showed no gross abnormalities.  Moderate left ventricular dilation with normal wall thickness. Diffuse severe hypokinesis with septal-lateral dyssynchrony consistent with LBBB, LV  EF 20%. No LV thrombus noted. Normal right ventricular size with low normal to mildly decreased systolic function, RV EF 44%. Mild left atrial enlargement. Normal right atrium. Trivial mitral regurgitation. Trileaflet aortic valve  with no stenosis or regurgitation.  Delayed enhancement imaging: In one view, there was a small area of subendocardial late gadolinium enhancement (LGE) at the apex, but this could not be reproduced in other views so may have been artifactual.  MEASUREMENTS: MEASUREMENTS LVEDV 306 mL LVSV 60 mL LVEF 20%  RVEDV 137 mL RVSV 60 mL RVEF 44%  T1 1129, ECV 31%  IMPRESSION: 1. Moderate LV dilation with diffuse severe LV hypokinesis and prominent septal-lateral dyssynchrony consistent with LBBB. EF 20%.  2.  No LV thrombus.  3. Normal RV size with low normal to mildly decreased systolic function, EF 44%.  4. Possible very small area of subendocardial LGE at the apex but may be artifactual. Overall, no evidence for large prior MI, myocarditis, or infiltrative disease like amyloidosis.  5. Mildly elevated extracellular volume percentage in the septum, this is likely nonspecific.  With minimal to no LV scarring and prominent septal-lateral dyssynchrony, this patient would likely be good CRT candidate.  Dalton Mclean   Electronically Signed By: Marca Ancona M.D. On: 12/17/2021 15:31           Current Reported Medications:.    No outpatient medications have been marked as taking for the 12/17/23 encounter (Appointment) with Rip Harbour, NP.    Physical Exam:    VS:  There were no vitals taken for this visit.   Wt Readings from Last 3 Encounters:  04/18/23 190 lb (86.2 kg)  03/07/23 185 lb 3.2 oz (84 kg)  02/04/23 187 lb 6.4 oz (85 kg)    GEN: Well nourished, well developed in no acute distress NECK: No JVD; No carotid bruits CARDIAC: ***RRR, no murmurs, rubs, gallops RESPIRATORY:  Clear to auscultation without rales, wheezing or rhonchi  ABDOMEN: Soft, non-tender, non-distended EXTREMITIES:  No edema; No acute deformity   Asessement and Plan:.     ***     Disposition: F/u with ***  Signed, Rip Harbour, NP

## 2023-12-15 DIAGNOSIS — J019 Acute sinusitis, unspecified: Secondary | ICD-10-CM | POA: Diagnosis not present

## 2023-12-17 ENCOUNTER — Encounter: Payer: Self-pay | Admitting: Internal Medicine

## 2023-12-17 ENCOUNTER — Ambulatory Visit: Payer: BC Managed Care – PPO | Attending: Cardiology | Admitting: Cardiology

## 2023-12-17 ENCOUNTER — Encounter: Payer: Self-pay | Admitting: Cardiology

## 2023-12-17 ENCOUNTER — Telehealth: Payer: Self-pay

## 2023-12-17 VITALS — BP 136/86 | HR 83 | Ht 65.0 in | Wt 174.0 lb

## 2023-12-17 DIAGNOSIS — N1832 Chronic kidney disease, stage 3b: Secondary | ICD-10-CM

## 2023-12-17 DIAGNOSIS — I428 Other cardiomyopathies: Secondary | ICD-10-CM | POA: Diagnosis not present

## 2023-12-17 DIAGNOSIS — I5020 Unspecified systolic (congestive) heart failure: Secondary | ICD-10-CM | POA: Diagnosis not present

## 2023-12-17 DIAGNOSIS — I639 Cerebral infarction, unspecified: Secondary | ICD-10-CM

## 2023-12-17 DIAGNOSIS — I5022 Chronic systolic (congestive) heart failure: Secondary | ICD-10-CM

## 2023-12-17 DIAGNOSIS — I480 Paroxysmal atrial fibrillation: Secondary | ICD-10-CM | POA: Diagnosis not present

## 2023-12-17 DIAGNOSIS — I10 Essential (primary) hypertension: Secondary | ICD-10-CM

## 2023-12-17 NOTE — Patient Instructions (Signed)
Medication Instructions:  No changes *If you need a refill on your cardiac medications before your next appointment, please call your pharmacy*  Lab Work: No labs  Testing/Procedures: Your physician has requested that you have an echocardiogram. Echocardiography is a painless test that uses sound waves to create images of your heart. It provides your doctor with information about the size and shape of your heart and how well your heart's chambers and valves are working. This procedure takes approximately one hour. There are no restrictions for this procedure. Please do NOT wear cologne, perfume, aftershave, or lotions (deodorant is allowed). Please arrive 15 minutes prior to your appointment time.  Please note: We ask at that you not bring children with you during ultrasound (echo/ vascular) testing. Due to room size and safety concerns, children are not allowed in the ultrasound rooms during exams. Our front office staff cannot provide observation of children in our lobby area while testing is being conducted. An adult accompanying a patient to their appointment will only be allowed in the ultrasound room at the discretion of the ultrasound technician under special circumstances. We apologize for any inconvenience.  Follow-Up: At [facility], you and your health needs are our priority.  As part of our continuing mission to provide you with exceptional heart care, we have created designated Provider Care Teams.  These Care Teams include your primary Cardiologist (physician) and Advanced Practice Providers (APPs -  Physician Assistants and Nurse Practitioners) who all work together to provide you with the care you need, when you need it.  We recommend signing up for the patient portal called "MyChart".  Sign up information is provided on this After Visit Summary.  MyChart is used to connect with patients for Virtual Visits (Telemedicine).  Patients are able to view lab/test results,  encounter notes, upcoming appointments, etc.  Non-urgent messages can be sent to your provider as well.   To learn more about what you can do with MyChart, go to [email or web address].    Your next appointment:   3 month(s)  Provider:   Rollene Rotunda, MD

## 2023-12-17 NOTE — Telephone Encounter (Signed)
Left message on voice mail trying to get patient's most recent Bmet and CBC

## 2024-01-02 ENCOUNTER — Ambulatory Visit (HOSPITAL_COMMUNITY): Payer: BC Managed Care – PPO | Attending: Cardiology

## 2024-01-02 ENCOUNTER — Telehealth: Payer: Self-pay | Admitting: Cardiology

## 2024-01-02 DIAGNOSIS — N1832 Chronic kidney disease, stage 3b: Secondary | ICD-10-CM | POA: Insufficient documentation

## 2024-01-02 DIAGNOSIS — I5020 Unspecified systolic (congestive) heart failure: Secondary | ICD-10-CM | POA: Insufficient documentation

## 2024-01-02 DIAGNOSIS — I428 Other cardiomyopathies: Secondary | ICD-10-CM | POA: Diagnosis not present

## 2024-01-02 DIAGNOSIS — I480 Paroxysmal atrial fibrillation: Secondary | ICD-10-CM | POA: Diagnosis not present

## 2024-01-02 DIAGNOSIS — I10 Essential (primary) hypertension: Secondary | ICD-10-CM | POA: Insufficient documentation

## 2024-01-02 LAB — ECHOCARDIOGRAM COMPLETE
Est EF: 20
S' Lateral: 5.4 cm

## 2024-01-02 MED ORDER — PERFLUTREN LIPID MICROSPHERE
1.0000 mL | INTRAVENOUS | Status: AC | PRN
  Administered 2024-01-02: 2 mL via INTRAVENOUS

## 2024-01-02 NOTE — Telephone Encounter (Signed)
 Patient identification verified by 2 forms. Marilynn Rail, RN    Called and spoke to patient  Patient states:   -received missed call this morning, no voicemail left   -yesterday she received all offering alternate time for Echo appointment today   -she is unsure if this is a follow up call to cancel the Echo  Informed patient message sent to scheduling team for assistance

## 2024-01-02 NOTE — Telephone Encounter (Signed)
 Patient states she is returning a call received a few minutes ago. Please advise.

## 2024-01-06 ENCOUNTER — Ambulatory Visit: Payer: BC Managed Care – PPO

## 2024-01-06 DIAGNOSIS — I428 Other cardiomyopathies: Secondary | ICD-10-CM

## 2024-01-06 DIAGNOSIS — I5020 Unspecified systolic (congestive) heart failure: Secondary | ICD-10-CM | POA: Diagnosis not present

## 2024-01-08 LAB — CUP PACEART REMOTE DEVICE CHECK
Battery Remaining Longevity: 97 mo
Battery Voltage: 3 V
Brady Statistic RV Percent Paced: 3.6 %
Date Time Interrogation Session: 20250303183711
HighPow Impedance: 58 Ohm
Implantable Lead Connection Status: 753985
Implantable Lead Connection Status: 753985
Implantable Lead Connection Status: 753985
Implantable Lead Implant Date: 20240301
Implantable Lead Implant Date: 20240301
Implantable Lead Implant Date: 20240301
Implantable Lead Location: 753858
Implantable Lead Location: 753859
Implantable Lead Location: 753860
Implantable Lead Model: 4598
Implantable Lead Model: 5076
Implantable Lead Model: 6935
Implantable Pulse Generator Implant Date: 20240301
Lead Channel Impedance Value: 285 Ohm
Lead Channel Impedance Value: 361 Ohm
Lead Channel Impedance Value: 380 Ohm
Lead Channel Impedance Value: 380 Ohm
Lead Channel Impedance Value: 399 Ohm
Lead Channel Impedance Value: 399 Ohm
Lead Channel Impedance Value: 513 Ohm
Lead Channel Impedance Value: 551 Ohm
Lead Channel Impedance Value: 589 Ohm
Lead Channel Impedance Value: 589 Ohm
Lead Channel Impedance Value: 589 Ohm
Lead Channel Impedance Value: 665 Ohm
Lead Channel Impedance Value: 684 Ohm
Lead Channel Pacing Threshold Amplitude: 0.75 V
Lead Channel Pacing Threshold Amplitude: 0.875 V
Lead Channel Pacing Threshold Amplitude: 1.25 V
Lead Channel Pacing Threshold Pulse Width: 0.4 ms
Lead Channel Pacing Threshold Pulse Width: 0.4 ms
Lead Channel Pacing Threshold Pulse Width: 0.4 ms
Lead Channel Sensing Intrinsic Amplitude: 18.6 mV
Lead Channel Sensing Intrinsic Amplitude: 3.5 mV
Lead Channel Setting Pacing Amplitude: 1.5 V
Lead Channel Setting Pacing Amplitude: 2 V
Lead Channel Setting Pacing Amplitude: 2.25 V
Lead Channel Setting Pacing Pulse Width: 0.4 ms
Lead Channel Setting Pacing Pulse Width: 0.4 ms
Lead Channel Setting Sensing Sensitivity: 0.3 mV
Zone Setting Status: 755011
Zone Setting Status: 755011

## 2024-01-15 ENCOUNTER — Telehealth: Payer: Self-pay

## 2024-01-15 NOTE — Telephone Encounter (Signed)
-----   Message from Rip Harbour sent at 01/12/2024  6:53 PM EDT ----- Please let Ms. Chopra know that her echocardiogram did not show any significant changes from her priors study, her ejection fraction remains reduced at 20%. Reviewed with Dr. Antoine Poche, he recommended continuing current medications and following up as planned.

## 2024-01-15 NOTE — Telephone Encounter (Signed)
 Left message to call back

## 2024-01-16 ENCOUNTER — Emergency Department (HOSPITAL_COMMUNITY)

## 2024-01-16 ENCOUNTER — Encounter (HOSPITAL_COMMUNITY): Payer: Self-pay

## 2024-01-16 ENCOUNTER — Other Ambulatory Visit: Payer: Self-pay

## 2024-01-16 ENCOUNTER — Inpatient Hospital Stay (HOSPITAL_COMMUNITY)
Admission: EM | Admit: 2024-01-16 | Discharge: 2024-01-20 | DRG: 291 | Disposition: A | Discharge status: Home / Self Care | Attending: Internal Medicine | Admitting: Internal Medicine

## 2024-01-16 DIAGNOSIS — I480 Paroxysmal atrial fibrillation: Secondary | ICD-10-CM | POA: Diagnosis present

## 2024-01-16 DIAGNOSIS — J9601 Acute respiratory failure with hypoxia: Secondary | ICD-10-CM | POA: Diagnosis present

## 2024-01-16 DIAGNOSIS — Z8673 Personal history of transient ischemic attack (TIA), and cerebral infarction without residual deficits: Secondary | ICD-10-CM | POA: Diagnosis not present

## 2024-01-16 DIAGNOSIS — Z8249 Family history of ischemic heart disease and other diseases of the circulatory system: Secondary | ICD-10-CM

## 2024-01-16 DIAGNOSIS — R0602 Shortness of breath: Secondary | ICD-10-CM | POA: Diagnosis not present

## 2024-01-16 DIAGNOSIS — D631 Anemia in chronic kidney disease: Secondary | ICD-10-CM | POA: Diagnosis present

## 2024-01-16 DIAGNOSIS — I110 Hypertensive heart disease with heart failure: Secondary | ICD-10-CM | POA: Diagnosis not present

## 2024-01-16 DIAGNOSIS — E662 Morbid (severe) obesity with alveolar hypoventilation: Secondary | ICD-10-CM | POA: Diagnosis present

## 2024-01-16 DIAGNOSIS — I10 Essential (primary) hypertension: Secondary | ICD-10-CM | POA: Diagnosis not present

## 2024-01-16 DIAGNOSIS — I509 Heart failure, unspecified: Secondary | ICD-10-CM | POA: Diagnosis not present

## 2024-01-16 DIAGNOSIS — R739 Hyperglycemia, unspecified: Secondary | ICD-10-CM | POA: Diagnosis present

## 2024-01-16 DIAGNOSIS — E7849 Other hyperlipidemia: Secondary | ICD-10-CM | POA: Diagnosis not present

## 2024-01-16 DIAGNOSIS — Z79899 Other long term (current) drug therapy: Secondary | ICD-10-CM | POA: Diagnosis not present

## 2024-01-16 DIAGNOSIS — J45909 Unspecified asthma, uncomplicated: Secondary | ICD-10-CM | POA: Diagnosis present

## 2024-01-16 DIAGNOSIS — Z7901 Long term (current) use of anticoagulants: Secondary | ICD-10-CM

## 2024-01-16 DIAGNOSIS — I160 Hypertensive urgency: Secondary | ICD-10-CM | POA: Diagnosis present

## 2024-01-16 DIAGNOSIS — Z95810 Presence of automatic (implantable) cardiac defibrillator: Secondary | ICD-10-CM

## 2024-01-16 DIAGNOSIS — R000 Tachycardia, unspecified: Secondary | ICD-10-CM | POA: Diagnosis present

## 2024-01-16 DIAGNOSIS — I2489 Other forms of acute ischemic heart disease: Secondary | ICD-10-CM | POA: Diagnosis present

## 2024-01-16 DIAGNOSIS — I447 Left bundle-branch block, unspecified: Secondary | ICD-10-CM | POA: Diagnosis present

## 2024-01-16 DIAGNOSIS — N184 Chronic kidney disease, stage 4 (severe): Secondary | ICD-10-CM | POA: Diagnosis not present

## 2024-01-16 DIAGNOSIS — Z1152 Encounter for screening for COVID-19: Secondary | ICD-10-CM

## 2024-01-16 DIAGNOSIS — J069 Acute upper respiratory infection, unspecified: Secondary | ICD-10-CM | POA: Insufficient documentation

## 2024-01-16 DIAGNOSIS — I428 Other cardiomyopathies: Secondary | ICD-10-CM

## 2024-01-16 DIAGNOSIS — E872 Acidosis, unspecified: Secondary | ICD-10-CM | POA: Insufficient documentation

## 2024-01-16 DIAGNOSIS — Z888 Allergy status to other drugs, medicaments and biological substances status: Secondary | ICD-10-CM | POA: Diagnosis not present

## 2024-01-16 DIAGNOSIS — I130 Hypertensive heart and chronic kidney disease with heart failure and stage 1 through stage 4 chronic kidney disease, or unspecified chronic kidney disease: Principal | ICD-10-CM | POA: Diagnosis present

## 2024-01-16 DIAGNOSIS — E785 Hyperlipidemia, unspecified: Secondary | ICD-10-CM | POA: Diagnosis present

## 2024-01-16 DIAGNOSIS — I517 Cardiomegaly: Secondary | ICD-10-CM | POA: Diagnosis not present

## 2024-01-16 DIAGNOSIS — I5041 Acute combined systolic (congestive) and diastolic (congestive) heart failure: Secondary | ICD-10-CM | POA: Diagnosis not present

## 2024-01-16 DIAGNOSIS — I5021 Acute systolic (congestive) heart failure: Secondary | ICD-10-CM | POA: Diagnosis not present

## 2024-01-16 DIAGNOSIS — I5043 Acute on chronic combined systolic (congestive) and diastolic (congestive) heart failure: Secondary | ICD-10-CM | POA: Diagnosis present

## 2024-01-16 DIAGNOSIS — Z91040 Latex allergy status: Secondary | ICD-10-CM

## 2024-01-16 DIAGNOSIS — Z90710 Acquired absence of both cervix and uterus: Secondary | ICD-10-CM

## 2024-01-16 LAB — CBC WITH DIFFERENTIAL/PLATELET
Abs Immature Granulocytes: 0.05 10*3/uL (ref 0.00–0.07)
Basophils Absolute: 0 10*3/uL (ref 0.0–0.1)
Basophils Relative: 0 %
Eosinophils Absolute: 0.6 10*3/uL — ABNORMAL HIGH (ref 0.0–0.5)
Eosinophils Relative: 5 %
HCT: 34.6 % — ABNORMAL LOW (ref 36.0–46.0)
Hemoglobin: 11.2 g/dL — ABNORMAL LOW (ref 12.0–15.0)
Immature Granulocytes: 1 %
Lymphocytes Relative: 9 %
Lymphs Abs: 1 10*3/uL (ref 0.7–4.0)
MCH: 31.6 pg (ref 26.0–34.0)
MCHC: 32.4 g/dL (ref 30.0–36.0)
MCV: 97.7 fL (ref 80.0–100.0)
Monocytes Absolute: 0.8 10*3/uL (ref 0.1–1.0)
Monocytes Relative: 8 %
Neutro Abs: 8 10*3/uL — ABNORMAL HIGH (ref 1.7–7.7)
Neutrophils Relative %: 77 %
Platelets: 223 10*3/uL (ref 150–400)
RBC: 3.54 MIL/uL — ABNORMAL LOW (ref 3.87–5.11)
RDW: 14.6 % (ref 11.5–15.5)
WBC: 10.5 10*3/uL (ref 4.0–10.5)
nRBC: 0 % (ref 0.0–0.2)

## 2024-01-16 LAB — COMPREHENSIVE METABOLIC PANEL
ALT: 15 U/L (ref 0–44)
AST: 28 U/L (ref 15–41)
Albumin: 3.3 g/dL — ABNORMAL LOW (ref 3.5–5.0)
Alkaline Phosphatase: 76 U/L (ref 38–126)
Anion gap: 12 (ref 5–15)
BUN: 41 mg/dL — ABNORMAL HIGH (ref 6–20)
CO2: 17 mmol/L — ABNORMAL LOW (ref 22–32)
Calcium: 8.8 mg/dL — ABNORMAL LOW (ref 8.9–10.3)
Chloride: 110 mmol/L (ref 98–111)
Creatinine, Ser: 2.6 mg/dL — ABNORMAL HIGH (ref 0.44–1.00)
GFR, Estimated: 20 mL/min — ABNORMAL LOW (ref >60)
Glucose, Bld: 171 mg/dL — ABNORMAL HIGH (ref 70–99)
Potassium: 4.6 mmol/L (ref 3.5–5.1)
Sodium: 139 mmol/L (ref 135–145)
Total Bilirubin: 0.6 mg/dL (ref 0.0–1.2)
Total Protein: 7.3 g/dL (ref 6.5–8.1)

## 2024-01-16 LAB — I-STAT VENOUS BLOOD GAS, ED
Acid-base deficit: 9 mmol/L — ABNORMAL HIGH (ref 0.0–2.0)
Bicarbonate: 17 mmol/L — ABNORMAL LOW (ref 20.0–28.0)
Calcium, Ion: 1.16 mmol/L (ref 1.15–1.40)
HCT: 35 % — ABNORMAL LOW (ref 36.0–46.0)
Hemoglobin: 11.9 g/dL — ABNORMAL LOW (ref 12.0–15.0)
O2 Saturation: 99 %
Potassium: 4.6 mmol/L (ref 3.5–5.1)
Sodium: 140 mmol/L (ref 135–145)
TCO2: 18 mmol/L — ABNORMAL LOW (ref 22–32)
pCO2, Ven: 38 mmHg — ABNORMAL LOW (ref 44–60)
pH, Ven: 7.258 (ref 7.25–7.43)
pO2, Ven: 140 mmHg — ABNORMAL HIGH (ref 32–45)

## 2024-01-16 LAB — RESP PANEL BY RT-PCR (RSV, FLU A&B, COVID)  RVPGX2
Influenza A by PCR: NEGATIVE
Influenza B by PCR: NEGATIVE
Resp Syncytial Virus by PCR: NEGATIVE
SARS Coronavirus 2 by RT PCR: NEGATIVE

## 2024-01-16 LAB — TROPONIN I (HIGH SENSITIVITY)
Troponin I (High Sensitivity): 21 ng/L — ABNORMAL HIGH (ref <18)
Troponin I (High Sensitivity): 28 ng/L — ABNORMAL HIGH (ref <18)

## 2024-01-16 LAB — BRAIN NATRIURETIC PEPTIDE: B Natriuretic Peptide: 526.2 pg/mL — ABNORMAL HIGH (ref 0.0–100.0)

## 2024-01-16 MED ORDER — CARVEDILOL 25 MG PO TABS
25.0000 mg | ORAL_TABLET | Freq: Two times a day (BID) | ORAL | Status: DC
  Administered 2024-01-17 – 2024-01-20 (×7): 25 mg via ORAL
  Filled 2024-01-16 (×3): qty 1
  Filled 2024-01-16: qty 2
  Filled 2024-01-16 (×3): qty 1

## 2024-01-16 MED ORDER — HYDRALAZINE HCL 25 MG PO TABS
25.0000 mg | ORAL_TABLET | Freq: Three times a day (TID) | ORAL | Status: DC
  Administered 2024-01-17 – 2024-01-19 (×7): 25 mg via ORAL
  Filled 2024-01-16 (×6): qty 1

## 2024-01-16 MED ORDER — SPIRONOLACTONE 25 MG PO TABS
25.0000 mg | ORAL_TABLET | Freq: Every day | ORAL | Status: DC
  Administered 2024-01-17 – 2024-01-18 (×2): 25 mg via ORAL
  Filled 2024-01-16 (×2): qty 1

## 2024-01-16 MED ORDER — ATORVASTATIN CALCIUM 80 MG PO TABS
80.0000 mg | ORAL_TABLET | Freq: Every day | ORAL | Status: DC
  Administered 2024-01-17 – 2024-01-20 (×4): 80 mg via ORAL
  Filled 2024-01-16 (×2): qty 1
  Filled 2024-01-16: qty 2
  Filled 2024-01-16 (×2): qty 1

## 2024-01-16 MED ORDER — SODIUM CHLORIDE 0.9% FLUSH: 3.0000 mL | INTRAVENOUS | Status: DC | PRN

## 2024-01-16 MED ORDER — ISOSORBIDE MONONITRATE ER 30 MG PO TB24
30.0000 mg | ORAL_TABLET | Freq: Every day | ORAL | Status: DC
  Administered 2024-01-17 – 2024-01-18 (×2): 30 mg via ORAL
  Filled 2024-01-16 (×2): qty 1

## 2024-01-16 MED ORDER — ACETAMINOPHEN 650 MG RE SUPP: 650.0000 mg | Freq: Four times a day (QID) | RECTAL | Status: DC | PRN

## 2024-01-16 MED ORDER — ACETAMINOPHEN 325 MG PO TABS
650.0000 mg | ORAL_TABLET | Freq: Four times a day (QID) | ORAL | Status: DC | PRN
  Administered 2024-01-17: 650 mg via ORAL
  Filled 2024-01-16: qty 2

## 2024-01-16 MED ORDER — FUROSEMIDE 10 MG/ML IJ SOLN
20.0000 mg | Freq: Two times a day (BID) | INTRAMUSCULAR | Status: DC
  Administered 2024-01-16 – 2024-01-17 (×2): 20 mg via INTRAVENOUS
  Filled 2024-01-16 (×2): qty 2

## 2024-01-16 MED ORDER — SODIUM CHLORIDE 0.9 % IV SOLN: 250.0000 mL | INTRAVENOUS | Status: AC | PRN

## 2024-01-16 MED ORDER — LEVALBUTEROL HCL 0.63 MG/3ML IN NEBU
0.6300 mg | INHALATION_SOLUTION | Freq: Four times a day (QID) | RESPIRATORY_TRACT | Status: DC | PRN
  Administered 2024-01-17 – 2024-01-18 (×2): 0.63 mg via RESPIRATORY_TRACT
  Filled 2024-01-16 (×2): qty 3

## 2024-01-16 MED ORDER — ONDANSETRON HCL 4 MG/2ML IJ SOLN: 4.0000 mg | Freq: Four times a day (QID) | INTRAMUSCULAR | Status: DC | PRN

## 2024-01-16 MED ORDER — SODIUM CHLORIDE 0.9% FLUSH
3.0000 mL | Freq: Two times a day (BID) | INTRAVENOUS | Status: DC
  Administered 2024-01-17 – 2024-01-20 (×8): 3 mL via INTRAVENOUS

## 2024-01-16 MED ORDER — APIXABAN 5 MG PO TABS
5.0000 mg | ORAL_TABLET | Freq: Two times a day (BID) | ORAL | Status: DC
  Administered 2024-01-17 – 2024-01-20 (×7): 5 mg via ORAL
  Filled 2024-01-16 (×7): qty 1

## 2024-01-16 MED ORDER — GUAIFENESIN ER 600 MG PO TB12
600.0000 mg | ORAL_TABLET | Freq: Two times a day (BID) | ORAL | Status: DC
  Administered 2024-01-16 – 2024-01-20 (×8): 600 mg via ORAL
  Filled 2024-01-16 (×8): qty 1

## 2024-01-16 MED ORDER — ONDANSETRON HCL 4 MG PO TABS: 4.0000 mg | ORAL_TABLET | Freq: Four times a day (QID) | ORAL | Status: DC | PRN

## 2024-01-16 MED ORDER — SODIUM BICARBONATE 650 MG PO TABS
650.0000 mg | ORAL_TABLET | Freq: Two times a day (BID) | ORAL | Status: DC
  Administered 2024-01-16 – 2024-01-20 (×8): 650 mg via ORAL
  Filled 2024-01-16 (×8): qty 1

## 2024-01-16 NOTE — H&P (Addendum)
 History and Physical    [name] NWG:[id number] DOB: 06-28-1961 DOA: 01/16/2024  PCP: Alysia Penna, MD   Patient coming from: Home   Chief Complaint:  Chief Complaint  Patient presents with   Shortness of Breath   Respiratory Distress   ED TRIAGE note:Patient BIB [facility] from home for resp distress, Patient reports she started feeling SHOB this morning. Initial O2 77%RA, increased to 96% with NRB. CPAP and nitroglycerin en route with EMS, lung sounds reported as rales throughout, BP 180/130, HR 110, RR 30, 97% on CPAP, 20g L wrist. Hx CHF, patient only takes lasix prn and took yesterday.   HPI:  [name] is a 63 y.o. 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  Patient inhaled Shelle desatted to 77% room air when EMS found found and patient has been placed on CPAP O2 sat improved to 96%.  Found to have elevated blood pressure 190/130 with tachycardia and tachypnea and route to ED. Patient is complaining about cough and congestion for last couple of days.  Patient is complaining about persistent dry cough and congestion and due to cough developed shortness of breath.  Denies any lower extremity swelling.  Complaining about orthopnea and PND. Denies any fever, nausea, vomiting and diarrhea. No other complaint at this time.   ED Course:  At presentation to emergency department O2 sat 100% on BiPAP and currently has been transition to high flow nasal cannula oxygen of 5 L.  Blood pressure has been improved to 130/108, respiratory 28 and heart rate in between 89-1 01. EKG showing sinus tachycardia heart rate 100. Chest x-ray showing cardiomegaly no active disease process. VBG showing pH 7.2, 5, low pCO2 38, pO2 140 and low bicarb 18. VBG  showing evidence of metabolic acidosis. Elevated BNP around 526. Respiratory panel unremarkable. Elevated troponin 21. CMP showing low bicarb 17, elevated creatinine 2.6 which is around baseline, low GFR 20 and normal anion gap 12. CBC unremarkable stable H&H 11.1 and 34.  In the ED patient has been transition BiPAP to high flow nasal cannula oxygen has been tolerating well.  Hospitalist has been consulted for further evaluation management of CHF exacerbation.   Significant labs in the ED: Lab Orders         Resp panel by RT-PCR (RSV, Flu A&B, Covid) Anterior Nasal Swab         CBC with Differential/Platelet         Comprehensive metabolic panel         Brain natriuretic peptide         HIV Antibody (routine testing w rflx)         Comprehensive metabolic panel         CBC         I-Stat venous blood gas, ([facility], [facility], [facility])       Review of Systems:  Review of Systems  Constitutional:  Negative for chills, fever, malaise/fatigue and weight loss.  Respiratory:  Positive for cough and shortness of breath. Negative for sputum production and wheezing.   Cardiovascular:  Positive for orthopnea, leg swelling and PND. Negative for chest pain and palpitations.  Gastrointestinal:  Negative for abdominal pain, nausea and vomiting.  Musculoskeletal:  Negative for back pain, joint pain, myalgias and neck pain.  Neurological:  Negative for dizziness and headaches.  Psychiatric/Behavioral:  The patient is not nervous/anxious.     Past Medical History:  Diagnosis Date   CHF (congestive heart failure) (HCC)    Hypertension    Overweight(278.02)    Sciatica    Secondary cardiomyopathy, unspecified    EF now 50%    Past Surgical History:  Procedure Laterality Date   ABDOMINAL HYSTERECTOMY     BIV ICD INSERTION CRT-D N/A 01/03/2023   Procedure: BIV ICD INSERTION CRT-D;  Surgeon: Marinus Maw, MD;  Location: [facility] INVASIVE CV LAB;  Service: Cardiovascular;  Laterality: N/A;     reports  that she has never smoked. She has never used smokeless tobacco. She reports current alcohol use of about 7.0 - 14.0 standard drinks of alcohol per week. She reports that she does not use drugs.  Allergies  Allergen Reactions   Latex Itching   Chlorhexidine Gluconate Hives    Unknown if this is the cause of the hives, raised areas around lab draw sites. No tape and area wiped with chlorhexidine.    Family History  Problem Relation Age of Onset   Cerebral aneurysm Mother 53       ruptured   Hypertension Mother        severe   Heart attack Father 67   Coronary artery disease Other    Hypertension Brother        severe   Colon cancer Neg Hx    Breast cancer Neg Hx     Prior to Admission medications   Medication Sig Start Date End Date Taking? Authorizing Provider  Acetaminophen (TYLENOL PO) Take 500 mg by mouth as needed.    [provider]  albuterol (VENTOLIN HFA) 108 (90 Base) MCG/ACT inhaler Inhale 1 puff into the lungs every 6 (six) hours as needed. 10/06/23   [provider]  apixaban (ELIQUIS) 5 MG TABS tablet TAKE 1 TABLET(5 MG) BY MOUTH TWICE DAILY 11/03/23   Eustace Pen, PA-C  atorvastatin (LIPITOR) 80 MG tablet Take 1 tablet (80 mg total) by mouth daily. 12/19/21   de Saintclair Halsted, Cortney E, NP  carvedilol (COREG) 25 MG tablet Take 1 tablet (25 mg total) by mouth 2 (two) times daily with a meal. 02/26/23   Rollene Rotunda, MD  cefdinir (OMNICEF) 300 MG capsule Take 300 mg by mouth 2 (two) times daily. 12/15/23   [provider]  furosemide (LASIX) 20 MG tablet Take 1 tablet (20 mg total) by mouth daily as needed for fluid or edema. 02/04/23   Eustace Pen, PA-C  hydrALAZINE (APRESOLINE) 25 MG tablet TAKE 1 TABLET(25 MG) BY MOUTH THREE TIMES DAILY 09/15/23   Marinus Maw, MD  isosorbide mononitrate (IMDUR) 120 MG 24 hr tablet TAKE 1 TABLET(120 MG) BY MOUTH DAILY 09/05/23   Rollene Rotunda, MD  spironolactone (ALDACTONE) 25 MG tablet Take 25 mg by  mouth daily.    [provider]  zolpidem (AMBIEN) 5 MG tablet Take 5 mg by mouth at bedtime as needed for sleep. 01/17/18   [provider]     Physical Exam: Vitals:   01/16/24 2245 01/16/24 2300 01/16/24 2315 01/16/24 2315  BP: (!) 145/95 136/85 138/86   Pulse: 83 87 82   Resp: 20 (!) 21 (!) 21   Temp:    98 F (36.7 C)  TempSrc:    Oral  SpO2: 100% 100% 100%   Weight:      Height:        Physical Exam Vitals and  nursing note reviewed.  Cardiovascular:     Rate and Rhythm: Normal rate and regular rhythm.  Pulmonary:     Effort: Pulmonary effort is normal.     Breath sounds: Examination of the right-middle field reveals wheezing. Examination of the left-middle field reveals wheezing. Examination of the right-lower field reveals rales. Examination of the left-lower field reveals rales. Wheezing and rales present. No decreased breath sounds or rhonchi.  Musculoskeletal:     Cervical back: Neck supple.     Left lower leg: No edema.  Skin:    General: Skin is dry.     Capillary Refill: Capillary refill takes less than 2 seconds.  Neurological:     Mental Status: She is alert and oriented to person, place, and time.  Psychiatric:        Mood and Affect: Mood normal. Mood is not anxious.      Labs on Admission: I have personally reviewed following labs and imaging studies  CBC: Recent Labs  Lab 01/16/24 2008 01/16/24 2020  WBC 10.5  --   NEUTROABS 8.0*  --   HGB 11.2* 11.9*  HCT 34.6* 35.0*  MCV 97.7  --   PLT 223  --    Basic Metabolic Panel: Recent Labs  Lab 01/16/24 2008 01/16/24 2020  NA 139 140  K 4.6 4.6  CL 110  --   CO2 17*  --   GLUCOSE 171*  --   BUN 41*  --   CREATININE 2.60*  --   CALCIUM 8.8*  --    GFR: Estimated Creatinine Clearance: 24.9 mL/min (A) (by C-G formula based on SCr of 2.6 mg/dL (H)). Liver Function Tests: Recent Labs  Lab 01/16/24 2008  AST 28  ALT 15  ALKPHOS 76  BILITOT 0.6  PROT 7.3  ALBUMIN 3.3*    No results for input(s): "LIPASE", "AMYLASE" in the last 168 hours. No results for input(s): "AMMONIA" in the last 168 hours. Coagulation Profile: No results for input(s): "INR", "PROTIME" in the last 168 hours. Cardiac Enzymes: Recent Labs  Lab 01/16/24 2008 01/16/24 2220  TROPONINIHS 21* 28*   BNP (last 3 results) Recent Labs    01/16/24 2008  BNP 526.2*   HbA1C: No results for input(s): "HGBA1C" in the last 72 hours. CBG: No results for input(s): "GLUCAP" in the last 168 hours. Lipid Profile: No results for input(s): "CHOL", "HDL", "LDLCALC", "TRIG", "CHOLHDL", "LDLDIRECT" in the last 72 hours. Thyroid Function Tests: No results for input(s): "TSH", "T4TOTAL", "FREET4", "T3FREE", "THYROIDAB" in the last 72 hours. Anemia Panel: No results for input(s): "VITAMINB12", "FOLATE", "FERRITIN", "TIBC", "IRON", "RETICCTPCT" in the last 72 hours. Urine analysis:    Component Value Date/Time   COLORURINE YELLOW 01/18/2010 2053   APPEARANCEUR CLEAR 01/18/2010 2053   LABSPEC 1.022 01/18/2010 2053   PHURINE 5.0 01/18/2010 2053   GLUCOSEU NEGATIVE 01/18/2010 2053   GLUCOSEU NEGATIVE 12/09/2006 1110   HGBUR NEGATIVE 01/18/2010 2053   BILIRUBINUR NEGATIVE 01/18/2010 2053   KETONESUR NEGATIVE 01/18/2010 2053   PROTEINUR NEGATIVE 01/18/2010 2053   UROBILINOGEN 0.2 01/18/2010 2053   NITRITE NEGATIVE 01/18/2010 2053   LEUKOCYTESUR  01/18/2010 2053    NEGATIVE MICROSCOPIC NOT DONE ON URINES WITH NEGATIVE PROTEIN, BLOOD, LEUKOCYTES, NITRITE, OR GLUCOSE <1000 mg/dL.    Radiological Exams on Admission: I have personally reviewed images DG Chest Portable 1 View Result Date: 01/16/2024 CLINICAL DATA:  Shortness of breath EXAM: PORTABLE CHEST 1 VIEW COMPARISON:  01/03/2023 FINDINGS: Left ICD remains in place, unchanged. Cardiomegaly.  No confluent opacities, effusions or edema. No acute bony abnormality. IMPRESSION: Cardiomegaly.  No active disease. Electronically Signed   By: Charlett Nose  M.D.   On: 01/16/2024 21:24     EKG: My personal interpretation of EKG shows: Sinus tachycardia heart rate 100, left bundle branch block, there is no ST-T wave abnormality.     Assessment/Plan: Principal Problem:   Acute combined systolic and diastolic congestive heart failure (HCC) Active Problems:   Essential hypertension   NICM- EF 25-30% echo 08/05/14   Hyperlipidemia   Paroxysmal A-fib (HCC)   CKD (chronic kidney disease), stage IV (HCC)   History of CVA (cerebrovascular accident)   Metabolic acidosis   Acute exacerbation of CHF (congestive heart failure) (HCC)   URI (upper respiratory infection)    Assessment and Plan: Acute on chronic systolic and diastolic heart failure exacerbation Essential hypertension Acute hypoxic respiratory failure in the setting of CHF exacerbation -Patient presented to emergency department complaining of shortness of breath which has been progressively getting worse.  EMS found O2 sat 70% has been room air and she has been placed on CPAP en route to ED patient found hypertensive and tachypneic.  While patient in the ED initially placed on BiPAP and eventually has been transition to high flow nasal cannula oxygen currently maintaining O2 sat 100% without any respiratory distress. -Previous echo from February 2025 showing low ejection fraction 20% and diastolic function intermediate. - Elevated BNP around 526. -Elevated troponin 21 and 28.  EKG showing normal sinus rhythm heart rate 100, left bundle branch block.  Patient denies any chest pain and chest pressure.  Elevated troponin secondary to demand ischemia in the setting of CHF exacerbation. - Chest x-ray showing cardiomegaly no acute disease process. - At home patient is on Lasix 20 mg as needed, Coreg, hydralazine, Imdur and spironolactone - Starting IV Lasix 20 mg twice daily, monitor urine output, daily weight and strict I's/O - Continue GDMT guided medications include Coreg, Aldactone. -  Continue hydralazine and Imdur. - Due to history of CKD stage IV currently not on Jardiance anymore. - Continue high flow nasal cannula oxygen and BiPAP as needed for any worsening respiratory distress or shortness of breath. -Continue aspiration precaution. -Consulted heart failure team.   Elevated troponin-demand ischemia -Elevated troponin 21 and 28.  EKG showing normal sinus rhythm heart rate 100, left bundle branch block.  Patient denies any chest pain and chest pressure.  Elevated troponin secondary to demand ischemia in the setting of CHF exacerbation.  Peroxisomal atrial fibrillation -Rate controlled.  Continue Coreg and Eliquis.  Viral upper respiratory tract symptoms - Patient is complaining about nonproductive dry cough with associated shortness of breath.  Physical exam showing wheezing of the medial bilateral lung fields. - Respiratory panel negative for COVID, RSV and flu.  Checking 20 respiratory panel.  Chest x-ray evidence of pneumonia. - Continue supportive care.   CKD stage IV Metabolic Acidosis -Creatinine 2.6.  Baseline creatinine around 1.99-3.  Renal function at baseline. - Low bicarb 17.  Normal anion gap metabolic acidosis in the setting of CKD stage IV. Starting oral bicarb 650 mg twice daily. -Patient follows nephrology outpatient and next clinic appointment on 01/19/2024  History of CVA -Continue Eliquis and Lipitor.  Hyperlipidemia -Continue Lipitor  Obesity hypoventilation syndrome - Continue to check pulse ox and Xopenex as needed.   DVT prophylaxis:  Eliquis Code Status:  Full Code Diet: Heart healthy diet, fluid restriction 2 L/day and salt restriction 2 g/day. Family Communication:  Family was present at bedside, at the time of interview. Opportunity was given to ask question and all questions were answered satisfactorily.  Disposition Plan: Continue monitor improvement of shortness of breath and volume status. Consults: Heart failure  team Admission status:   Inpatient, Step Down Unit  Severity of Illness: The appropriate patient status for this patient is INPATIENT. Inpatient status is judged to be reasonable and necessary in order to provide the required intensity of service to ensure the patient's safety. The patient's presenting symptoms, physical exam findings, and initial radiographic and laboratory data in the context of their chronic comorbidities is felt to place them at high risk for further clinical deterioration. Furthermore, it is not anticipated that the patient will be medically stable for discharge from the hospital within 2 midnights of admission.   * I certify that at the point of admission it is my clinical judgment that the patient will require inpatient hospital care spanning beyond 2 midnights from the point of admission due to high intensity of service, high risk for further deterioration and high frequency of surveillance required.Marland Kitchen    Tereasa Coop, MD [facility]  How to contact the [facility] Attending or Consulting provider 7A - 7P or covering provider during after hours 7P -7A, for this patient.  Check the care team in [facility] and look for a) attending/consulting [facility] provider listed and b) the [facility] team listed Log into [email or web address] and use [place]'s universal password to access. If you do not have the password, please contact the hospital operator. Locate the [facility] provider you are looking for under [facility] and page to a number that you can be directly reached. If you still have difficulty reaching the provider, please page the [facility] (Director on Call) for the Hospitalists listed on amion for assistance.  01/16/2024, 11:39 PM

## 2024-01-16 NOTE — Progress Notes (Signed)
 Patient taken off BIPAP and placed on 5L salter, patient currently tolerating well.

## 2024-01-16 NOTE — ED Provider Notes (Signed)
 [place] EMERGENCY DEPARTMENT AT [facility] Provider Note   CSN: [id number] Arrival date & time: 01/16/24  1922     History  Chief Complaint  Patient presents with   Shortness of Breath   Respiratory Distress    [name] is a 63 y.o. female.   Shortness of Breath  Patient has been experiencing worsening shortness of breath since January 2025.  However, it got acutely worse this morning which prompted her call to EMS.  At that time, she had a 77% O2 sat on room air that improved 96% with nonrebreather.  EMS brought her to the ED and placed on CPAP and given nitroglycerin due to persistent shortness of breath.  BP noted to be 180/130 with tachycardia tachypnea en route.  She states that she had been taking her home Lasix 20 mg as needed for edema as well as spironolactone 25 mg daily.  She recently had a echo at the end of February which demonstrated an LVEF 20%.  She has also had some cough and congestion over the last couple of days, the denies sick contacts.  Denies fever or other sick symptoms.  No abdominal pain.  She takes Eliquis at home.    Home Medications Prior to Admission medications   Medication Sig Start Date End Date Taking? Authorizing Provider  Acetaminophen (TYLENOL PO) Take 500 mg by mouth as needed.    [provider]  albuterol (VENTOLIN HFA) 108 (90 Base) MCG/ACT inhaler Inhale 1 puff into the lungs every 6 (six) hours as needed. 10/06/23   [provider]  apixaban (ELIQUIS) 5 MG TABS tablet TAKE 1 TABLET(5 MG) BY MOUTH TWICE DAILY 11/03/23   Eustace Pen, PA-C  atorvastatin (LIPITOR) 80 MG tablet Take 1 tablet (80 mg total) by mouth daily. 12/19/21   de Saintclair Halsted, Cortney E, NP  carvedilol (COREG) 25 MG tablet Take 1 tablet (25 mg total) by mouth 2 (two) times daily with a meal. 02/26/23   Rollene Rotunda, MD  cefdinir (OMNICEF) 300 MG capsule Take 300 mg by mouth 2 (two) times daily. 12/15/23   [provider]   furosemide (LASIX) 20 MG tablet Take 1 tablet (20 mg total) by mouth daily as needed for fluid or edema. 02/04/23   Eustace Pen, PA-C  hydrALAZINE (APRESOLINE) 25 MG tablet TAKE 1 TABLET(25 MG) BY MOUTH THREE TIMES DAILY 09/15/23   Marinus Maw, MD  isosorbide mononitrate (IMDUR) 120 MG 24 hr tablet TAKE 1 TABLET(120 MG) BY MOUTH DAILY 09/05/23   Rollene Rotunda, MD  spironolactone (ALDACTONE) 25 MG tablet Take 25 mg by mouth daily.    [provider]  zolpidem (AMBIEN) 5 MG tablet Take 5 mg by mouth at bedtime as needed for sleep. 01/17/18   [provider]      Allergies    Latex and Chlorhexidine gluconate    Review of Systems   Review of Systems  Respiratory:  Positive for shortness of breath.     Physical Exam Updated Vital Signs BP 138/86   Pulse 82   Temp 98 F (36.7 C) (Oral)   Resp (!) 21   Ht 5\' 7"  (1.702 m)   Wt 78.9 kg   SpO2 100%   BMI 27.25 kg/m  Physical Exam Constitutional:      Appearance: She is well-developed. She is ill-appearing.  HENT:     Head: Normocephalic and atraumatic.  Eyes:     Extraocular Movements: Extraocular movements intact.  Cardiovascular:     Rate and Rhythm: Regular rhythm. Tachycardia present.  Pulmonary:     Effort: Tachypnea and respiratory distress present.     Breath sounds: Rales present.  Abdominal:     General: Bowel sounds are normal.     Palpations: Abdomen is soft.     Tenderness: There is no abdominal tenderness.  Musculoskeletal:     Cervical back: Normal range of motion.  Neurological:     Mental Status: She is alert.     ED Results / Procedures / Treatments   Labs (all labs ordered are listed, but only abnormal results are displayed) Labs Reviewed  CBC WITH DIFFERENTIAL/PLATELET - Abnormal; Notable for the following components:      Result Value   RBC 3.54 (*)    Hemoglobin 11.2 (*)    HCT 34.6 (*)    Neutro Abs 8.0 (*)    Eosinophils Absolute 0.6 (*)    All other components  within normal limits  COMPREHENSIVE METABOLIC PANEL - Abnormal; Notable for the following components:   CO2 17 (*)    Glucose, Bld 171 (*)    BUN 41 (*)    Creatinine, Ser 2.60 (*)    Calcium 8.8 (*)    Albumin 3.3 (*)    GFR, Estimated 20 (*)    All other components within normal limits  BRAIN NATRIURETIC PEPTIDE - Abnormal; Notable for the following components:   B Natriuretic Peptide 526.2 (*)    All other components within normal limits  I-STAT VENOUS BLOOD GAS, ED - Abnormal; Notable for the following components:   pCO2, Ven 38.0 (*)    pO2, Ven 140 (*)    Bicarbonate 17.0 (*)    TCO2 18 (*)    Acid-base deficit 9.0 (*)    HCT 35.0 (*)    Hemoglobin 11.9 (*)    All other components within normal limits  TROPONIN I (HIGH SENSITIVITY) - Abnormal; Notable for the following components:   Troponin I (High Sensitivity) 21 (*)    All other components within normal limits  TROPONIN I (HIGH SENSITIVITY) - Abnormal; Notable for the following components:   Troponin I (High Sensitivity) 28 (*)    All other components within normal limits  RESP PANEL BY RT-PCR (RSV, FLU A&B, COVID)  RVPGX2  HIV ANTIBODY (ROUTINE TESTING W REFLEX)  COMPREHENSIVE METABOLIC PANEL  CBC    EKG EKG Interpretation Date/Time:  Friday January 16 2024 19:29:05 EDT Ventricular Rate:  100 PR Interval:  116 QRS Duration:  135 QT Interval:  393 QTC Calculation: 507 R Axis:   -69  Text Interpretation: Sinus tachycardia Left bundle branch block Baseline wander in lead(s) V2 when compared to prior faster paced rhythm No STEMI Confirmed by Theda Belfast ([id number]) on 01/16/2024 7:40:36 PM  Radiology DG Chest Portable 1 View Result Date: 01/16/2024 CLINICAL DATA:  Shortness of breath EXAM: PORTABLE CHEST 1 VIEW COMPARISON:  01/03/2023 FINDINGS: Left ICD remains in place, unchanged. Cardiomegaly. No confluent opacities, effusions or edema. No acute bony abnormality. IMPRESSION: Cardiomegaly.  No active disease.  Electronically Signed   By: Charlett Nose M.D.   On: 01/16/2024 21:24    Procedures Procedures    Medications Ordered in ED Medications  atorvastatin (LIPITOR) tablet 80 mg (has no administration in time range)  carvedilol (COREG) tablet 25 mg (has no administration in time range)  furosemide (LASIX) injection 20 mg (has no administration in time range)  hydrALAZINE (APRESOLINE) tablet 25 mg (has no administration in  time range)  isosorbide mononitrate (IMDUR) 24 hr tablet 30 mg (has no administration in time range)  spironolactone (ALDACTONE) tablet 25 mg (has no administration in time range)  apixaban (ELIQUIS) tablet 5 mg (has no administration in time range)  levalbuterol (XOPENEX) nebulizer solution 0.63 mg (has no administration in time range)  sodium chloride flush (NS) 0.9 % injection 3 mL (has no administration in time range)  sodium chloride flush (NS) 0.9 % injection 3 mL (has no administration in time range)  0.9 %  sodium chloride infusion (has no administration in time range)  acetaminophen (TYLENOL) tablet 650 mg (has no administration in time range)    Or  acetaminophen (TYLENOL) suppository 650 mg (has no administration in time range)  ondansetron (ZOFRAN) tablet 4 mg (has no administration in time range)    Or  ondansetron (ZOFRAN) injection 4 mg (has no administration in time range)    ED Course/ Medical Decision Making/ A&P                                 Medical Decision Making Amount and/or Complexity of Data Reviewed Labs: ordered. Radiology: ordered.  Risk Decision regarding hospitalization.   63 year old female with a history of systolic heart failure recent EF 20%, hypertension, CKD 3, stroke, and PAF here with worsening shortness of breath.  She remains hypertensive, tachycardic, tachypneic.  O2 saturations normal on CPAP.  Differential for presentation includes CHF exacerbation, flash pulmonary edema in the setting of hypertension, pneumonia, viral  process, ACS, PE.  I-STAT VBG with normal pH and slightly decreased pCO2.  No leukocytosis.  Creatinine 2.60 which is up from 2.26 ten months ago.  Troponins mildly elevated to the 20s.  BNP elevated to 500s.  Chest x-ray with cardiomegaly though without active disease on radiologic read.  Given hypoxemia on presentation and persistent oxygen requirement in the setting of heart failure exacerbation, will admit for further treatment with diuresis and weaning of O2.        Final Clinical Impression(s) / ED Diagnoses Final diagnoses:  Shortness of breath    Rx / DC Orders ED Discharge Orders     None         Evette Georges, MD 01/16/24 2318    Tegeler, Canary Brim, MD 01/20/24 1233

## 2024-01-16 NOTE — ED Triage Notes (Addendum)
 Patient BIB [facility] from home for resp distress, Patient reports she started feeling SHOB this morning. Initial O2 77%RA, increased to 96% with NRB. CPAP and nitroglycerin en route with EMS, lung sounds reported as rales throughout, BP 180/130, HR 110, RR 30, 97% on CPAP, 20g L wrist. Hx CHF, patient only takes lasix prn and took yesterday.

## 2024-01-17 ENCOUNTER — Inpatient Hospital Stay (HOSPITAL_COMMUNITY)

## 2024-01-17 DIAGNOSIS — I5041 Acute combined systolic (congestive) and diastolic (congestive) heart failure: Secondary | ICD-10-CM | POA: Diagnosis not present

## 2024-01-17 DIAGNOSIS — I5021 Acute systolic (congestive) heart failure: Secondary | ICD-10-CM

## 2024-01-17 LAB — CBC
HCT: 32.1 % — ABNORMAL LOW (ref 36.0–46.0)
Hemoglobin: 10.3 g/dL — ABNORMAL LOW (ref 12.0–15.0)
MCH: 31 pg (ref 26.0–34.0)
MCHC: 32.1 g/dL (ref 30.0–36.0)
MCV: 96.7 fL (ref 80.0–100.0)
Platelets: 220 10*3/uL (ref 150–400)
RBC: 3.32 MIL/uL — ABNORMAL LOW (ref 3.87–5.11)
RDW: 14.6 % (ref 11.5–15.5)
WBC: 8.6 10*3/uL (ref 4.0–10.5)
nRBC: 0 % (ref 0.0–0.2)

## 2024-01-17 LAB — ECHOCARDIOGRAM LIMITED
AR max vel: 1.91 cm2
AV Peak grad: 11.6 mmHg
Ao pk vel: 1.7 m/s
Area-P 1/2: 4.53 cm2
Calc EF: 25.2 %
Height: 67 in
S' Lateral: 4 cm
Single Plane A2C EF: 29.3 %
Single Plane A4C EF: 20.3 %
Weight: 2784 [oz_av]

## 2024-01-17 LAB — RESPIRATORY PANEL BY PCR

## 2024-01-17 LAB — COMPREHENSIVE METABOLIC PANEL
ALT: 13 U/L (ref 0–44)
AST: 19 U/L (ref 15–41)
Albumin: 3.1 g/dL — ABNORMAL LOW (ref 3.5–5.0)
Alkaline Phosphatase: 70 U/L (ref 38–126)
Anion gap: 9 (ref 5–15)
BUN: 40 mg/dL — ABNORMAL HIGH (ref 6–20)
CO2: 20 mmol/L — ABNORMAL LOW (ref 22–32)
Calcium: 8.7 mg/dL — ABNORMAL LOW (ref 8.9–10.3)
Chloride: 111 mmol/L (ref 98–111)
Creatinine, Ser: 2.43 mg/dL — ABNORMAL HIGH (ref 0.44–1.00)
GFR, Estimated: 22 mL/min — ABNORMAL LOW (ref >60)
Glucose, Bld: 115 mg/dL — ABNORMAL HIGH (ref 70–99)
Potassium: 4.9 mmol/L (ref 3.5–5.1)
Sodium: 140 mmol/L (ref 135–145)
Total Bilirubin: 0.7 mg/dL (ref 0.0–1.2)
Total Protein: 6.9 g/dL (ref 6.5–8.1)

## 2024-01-17 MED ORDER — FUROSEMIDE 10 MG/ML IJ SOLN
40.0000 mg | Freq: Two times a day (BID) | INTRAMUSCULAR | Status: DC
  Administered 2024-01-17 – 2024-01-18 (×2): 40 mg via INTRAVENOUS
  Filled 2024-01-17 (×2): qty 4

## 2024-01-17 MED ORDER — HYDROCODONE-ACETAMINOPHEN 5-325 MG PO TABS
1.0000 | ORAL_TABLET | Freq: Four times a day (QID) | ORAL | Status: DC | PRN
  Administered 2024-01-17: 1 via ORAL
  Filled 2024-01-17: qty 1

## 2024-01-17 NOTE — Plan of Care (Signed)

## 2024-01-17 NOTE — ED Notes (Addendum)
 Pt ambulated to bathroom with assistance. Pt has baseline limp, but otherwise steady gait. Pt stated she felt much better than when she arrived

## 2024-01-17 NOTE — Progress Notes (Signed)
 Echocardiogram 2D Echocardiogram has been performed.  [name] 01/17/2024, 9:46 AM

## 2024-01-17 NOTE — Progress Notes (Signed)
 PROGRESS NOTE    [name]  UXL:[id number] DOB: April 22, 1961 DOA: 01/16/2024 PCP: Alysia Penna, MD  62/F with chronic systolic CHF, NICM, CKD 4, hypertension, paroxysmal A-fib on Eliquis, CVA presented to the ED with acute respiratory distress, patient reported progressive dyspnea on exertion X 1 month, ongoing cough for a few months, in the ED she was hypertensive, 190/130, tachypneic and tachycardic, placed on BiPAP, BNP 526, troponin 21, creatinine 2.6 which is her baseline, hemoglobin 11.1, chest x-ray with cardiomegaly noted no acute findings. -Admitted, placed on BiPAP and diuretics   Subjective: -Feels better, breathing is improving, down to 5 L O2  Assessment and Plan:  Acute on chronic systolic CHF -Last echo 1/25 with EF 20%, normal RV -Clinically appears only mildly volume overloaded, likely hypertensive urgency also contributing -Continue IV Lasix, increased dose to 40 Mg twice daily -Continue Imdur, hydralazine GDMT limited by CKD 4  Acute hypoxic respiratory failure Secondary to above, improving -Wean O2 as tolerated  CKD 4 -Stable, monitor with diuresis  Paroxysmal A-fib -Rate controlled continue Coreg and Eliquis  History of CVA -Continue Eliquis and Lipitor  Hyperglycemia Check HbA1c  Chronic normocytic anemia Monitor  DVT prophylaxis: Eliquis Code Status: Full code Family Communication: Daughter at bedside Disposition Plan: Home pending improvement in respiratory status  Consultants:    Procedures:   Antimicrobials:    Objective: Vitals:   01/17/24 0620 01/17/24 0621 01/17/24 0630 01/17/24 0945  BP: (!) 137/97 (!) 137/97 137/89 (!) 150/94  Pulse: 82  82 87  Resp: (!) 22  (!) 21 (!) 24  Temp: 98.1 F (36.7 C)   98.5 F (36.9 C)  TempSrc: Oral   Oral  SpO2: 100%  100% 100%  Weight:      Height:        Intake/Output Summary (Last 24 hours) at 01/17/2024 1030 Last data filed at 01/17/2024 0727 Gross per 24 hour  Intake 440 ml   Output --  Net 440 ml   Filed Weights   01/16/24 1927  Weight: 78.9 kg    Examination:  General exam: Appears calm and comfortable  HEENT: No JVD Respiratory system: Clear to auscultation Cardiovascular system: S1 & S2 heard, RRR.  Abd: nondistended, soft and nontender.Normal bowel sounds heard. Central nervous system: Alert and oriented. No focal neurological deficits. Extremities: no edema Skin: No rashes Psychiatry:  Mood & affect appropriate.     Data Reviewed:   CBC: Recent Labs  Lab 01/16/24 2008 01/16/24 2020 01/17/24 0411  WBC 10.5  --  8.6  NEUTROABS 8.0*  --   --   HGB 11.2* 11.9* 10.3*  HCT 34.6* 35.0* 32.1*  MCV 97.7  --  96.7  PLT 223  --  220   Basic Metabolic Panel: Recent Labs  Lab 01/16/24 2008 01/16/24 2020 01/17/24 0411  NA 139 140 140  K 4.6 4.6 4.9  CL 110  --  111  CO2 17*  --  20*  GLUCOSE 171*  --  115*  BUN 41*  --  40*  CREATININE 2.60*  --  2.43*  CALCIUM 8.8*  --  8.7*   GFR: Estimated Creatinine Clearance: 26.6 mL/min (A) (by C-G formula based on SCr of 2.43 mg/dL (H)). Liver Function Tests: Recent Labs  Lab 01/16/24 2008 01/17/24 0411  AST 28 19  ALT 15 13  ALKPHOS 76 70  BILITOT 0.6 0.7  PROT 7.3 6.9  ALBUMIN 3.3* 3.1*   No results for input(s): "LIPASE", "AMYLASE" in the last  168 hours. No results for input(s): "AMMONIA" in the last 168 hours. Coagulation Profile: No results for input(s): "INR", "PROTIME" in the last 168 hours. Cardiac Enzymes: No results for input(s): "CKTOTAL", "CKMB", "CKMBINDEX", "TROPONINI" in the last 168 hours. BNP (last 3 results) No results for input(s): "PROBNP" in the last 8760 hours. HbA1C: No results for input(s): "HGBA1C" in the last 72 hours. CBG: No results for input(s): "GLUCAP" in the last 168 hours. Lipid Profile: No results for input(s): "CHOL", "HDL", "LDLCALC", "TRIG", "CHOLHDL", "LDLDIRECT" in the last 72 hours. Thyroid Function Tests: No results for input(s):  "TSH", "T4TOTAL", "FREET4", "T3FREE", "THYROIDAB" in the last 72 hours. Anemia Panel: No results for input(s): "VITAMINB12", "FOLATE", "FERRITIN", "TIBC", "IRON", "RETICCTPCT" in the last 72 hours. Urine analysis:    Component Value Date/Time   COLORURINE YELLOW 01/18/2010 2053   APPEARANCEUR CLEAR 01/18/2010 2053   LABSPEC 1.022 01/18/2010 2053   PHURINE 5.0 01/18/2010 2053   GLUCOSEU NEGATIVE 01/18/2010 2053   GLUCOSEU NEGATIVE 12/09/2006 1110   HGBUR NEGATIVE 01/18/2010 2053   BILIRUBINUR NEGATIVE 01/18/2010 2053   KETONESUR NEGATIVE 01/18/2010 2053   PROTEINUR NEGATIVE 01/18/2010 2053   UROBILINOGEN 0.2 01/18/2010 2053   NITRITE NEGATIVE 01/18/2010 2053   LEUKOCYTESUR  01/18/2010 2053    NEGATIVE MICROSCOPIC NOT DONE ON URINES WITH NEGATIVE PROTEIN, BLOOD, LEUKOCYTES, NITRITE, OR GLUCOSE <1000 mg/dL.   Sepsis Labs: @LABRCNTIP (procalcitonin:4,lacticidven:4)  ) Recent Results (from the past 240 hours)  Resp panel by RT-PCR (RSV, Flu A&B, Covid) Anterior Nasal Swab     Status: None   Collection Time: 01/16/24  8:08 PM   Specimen: Anterior Nasal Swab  Result Value Ref Range Status   SARS Coronavirus 2 by RT PCR NEGATIVE NEGATIVE Final   Influenza A by PCR NEGATIVE NEGATIVE Final   Influenza B by PCR NEGATIVE NEGATIVE Final    Comment: (NOTE) The Xpert Xpress SARS-CoV-2/FLU/RSV plus assay is intended as an aid in the diagnosis of influenza from Nasopharyngeal swab specimens and should not be used as a sole basis for treatment. Nasal washings and aspirates are unacceptable for Xpert Xpress SARS-CoV-2/FLU/RSV testing.  Fact Sheet for Patients: BloggerCourse.com  Fact Sheet for Healthcare Providers: SeriousBroker.it  This test is not yet approved or cleared by the [place] FDA and has been authorized for detection and/or diagnosis of SARS-CoV-2 by FDA under an Emergency Use Authorization (EUA). This EUA will remain in  effect (meaning this test can be used) for the duration of the COVID-19 declaration under Section 564(b)(1) of the Act, 21 U.S.C. section 360bbb-3(b)(1), unless the authorization is terminated or revoked.     Resp Syncytial Virus by PCR NEGATIVE NEGATIVE Final    Comment: (NOTE) Fact Sheet for Patients: BloggerCourse.com  Fact Sheet for Healthcare Providers: SeriousBroker.it  This test is not yet approved or cleared by the [place] FDA and has been authorized for detection and/or diagnosis of SARS-CoV-2 by FDA under an Emergency Use Authorization (EUA). This EUA will remain in effect (meaning this test can be used) for the duration of the COVID-19 declaration under Section 564(b)(1) of the Act, 21 U.S.C. section 360bbb-3(b)(1), unless the authorization is terminated or revoked.  Performed at [facility], [address], [place], [place] [address]      Radiology Studies: DG Chest Portable 1 View Result Date: 01/16/2024 CLINICAL DATA:  Shortness of breath EXAM: PORTABLE CHEST 1 VIEW COMPARISON:  01/03/2023 FINDINGS: Left ICD remains in place, unchanged. Cardiomegaly. No confluent opacities, effusions or edema. No acute bony  abnormality. IMPRESSION: Cardiomegaly.  No active disease. Electronically Signed   By: Charlett Nose M.D.   On: 01/16/2024 21:24     Scheduled Meds:  apixaban  5 mg Oral BID   atorvastatin  80 mg Oral Daily   carvedilol  25 mg Oral BID WC   furosemide  20 mg Intravenous BID   guaiFENesin  600 mg Oral BID   hydrALAZINE  25 mg Oral Q8H   isosorbide mononitrate  30 mg Oral Daily   sodium bicarbonate  650 mg Oral BID   sodium chloride flush  3 mL Intravenous Q12H   spironolactone  25 mg Oral Daily   Continuous Infusions:  sodium chloride       LOS: 1 day    Time spent:    Zannie Cove, MD [facility]   01/17/2024, 10:30 AM

## 2024-01-18 DIAGNOSIS — I5041 Acute combined systolic (congestive) and diastolic (congestive) heart failure: Secondary | ICD-10-CM | POA: Diagnosis not present

## 2024-01-18 LAB — CBC
HCT: 32.2 % — ABNORMAL LOW (ref 36.0–46.0)
Hemoglobin: 10.4 g/dL — ABNORMAL LOW (ref 12.0–15.0)
MCH: 31.3 pg (ref 26.0–34.0)
MCHC: 32.3 g/dL (ref 30.0–36.0)
MCV: 97 fL (ref 80.0–100.0)
Platelets: 221 10*3/uL (ref 150–400)
RBC: 3.32 MIL/uL — ABNORMAL LOW (ref 3.87–5.11)
RDW: 14.5 % (ref 11.5–15.5)
WBC: 7.4 10*3/uL (ref 4.0–10.5)
nRBC: 0 % (ref 0.0–0.2)

## 2024-01-18 LAB — COMPREHENSIVE METABOLIC PANEL
ALT: 13 U/L (ref 0–44)
AST: 17 U/L (ref 15–41)
Albumin: 3 g/dL — ABNORMAL LOW (ref 3.5–5.0)
Alkaline Phosphatase: 65 U/L (ref 38–126)
Anion gap: 10 (ref 5–15)
BUN: 47 mg/dL — ABNORMAL HIGH (ref 6–20)
CO2: 23 mmol/L (ref 22–32)
Calcium: 8.9 mg/dL (ref 8.9–10.3)
Chloride: 109 mmol/L (ref 98–111)
Creatinine, Ser: 2.88 mg/dL — ABNORMAL HIGH (ref 0.44–1.00)
GFR, Estimated: 18 mL/min — ABNORMAL LOW (ref >60)
Glucose, Bld: 99 mg/dL (ref 70–99)
Potassium: 4.8 mmol/L (ref 3.5–5.1)
Sodium: 142 mmol/L (ref 135–145)
Total Bilirubin: 0.7 mg/dL (ref 0.0–1.2)
Total Protein: 6.9 g/dL (ref 6.5–8.1)

## 2024-01-18 LAB — HEMOGLOBIN A1C
Hgb A1c MFr Bld: 6.1 % — ABNORMAL HIGH (ref 4.8–5.6)
Mean Plasma Glucose: 128.37 mg/dL

## 2024-01-18 LAB — BRAIN NATRIURETIC PEPTIDE: B Natriuretic Peptide: 236.5 pg/mL — ABNORMAL HIGH (ref 0.0–100.0)

## 2024-01-18 MED ORDER — METHYLPREDNISOLONE SODIUM SUCC 40 MG IJ SOLR
40.0000 mg | Freq: Every day | INTRAMUSCULAR | Status: DC
  Administered 2024-01-18: 40 mg via INTRAVENOUS
  Filled 2024-01-18: qty 1

## 2024-01-18 MED ORDER — IPRATROPIUM-ALBUTEROL 0.5-2.5 (3) MG/3ML IN SOLN
3.0000 mL | Freq: Four times a day (QID) | RESPIRATORY_TRACT | Status: DC
  Administered 2024-01-18: 3 mL via RESPIRATORY_TRACT
  Filled 2024-01-18: qty 3

## 2024-01-18 MED ORDER — LORATADINE 10 MG PO TABS
10.0000 mg | ORAL_TABLET | Freq: Every day | ORAL | Status: DC
  Administered 2024-01-18 – 2024-01-20 (×3): 10 mg via ORAL
  Filled 2024-01-18 (×3): qty 1

## 2024-01-18 MED ORDER — BENZONATATE 100 MG PO CAPS
200.0000 mg | ORAL_CAPSULE | Freq: Two times a day (BID) | ORAL | Status: DC
  Administered 2024-01-18 – 2024-01-20 (×5): 200 mg via ORAL
  Filled 2024-01-18 (×5): qty 2

## 2024-01-18 NOTE — Progress Notes (Signed)
   Heart Failure Stewardship Pharmacist Progress Note   PCP: Alysia Penna, MD PCP-Cardiologist: Rollene Rotunda, MD    HPI:  63 y.o. female with PMHx HTN, CKD (stage IV), ischemic stroke, paroxysmal A fib, insomnia, and CHF.   Almadelia arrived to [facility] ED on 03/14 with complaints of SOB since January 2025 that worsened that morning. She called EMS at was found to have O2 sat of 77%. She was put on non-rebreather and O2 sat improved to 96%. She was given NTG. BP was 180/130 and she was tachycardic en route to ED. She told FM resident she was taking Furosemide 20 mg PRN and spironolactone. She also reported some cough and congestion over the last couple of days BNP 526.. CXR on 03/14 showed cardiomegaly and no active disease. ECHO on LVEF of 20-25% (20% in February 2025), LV has severely decreased function, moderate asymmetric LVH, GI DD, and right atrial pressure of 3 mmHg.   Current HF Medications: -Beta Blocker: Carvedilol 25 mg BID -Other: Hydralazine 25 mg Q8H, Imdur 30 mg every day   Prior to admission HF Medications: -Diuretic: Furosemide 20 mg every day PRN Beta Blocker: Carvedilol 25 mg BID (last dispensed Jan 2025 for 30 ds) MRA: Spironolactone 25 mg every day (last dispensed Jan 2025 for 90 ds) Other: Isorsobide mononitrate 120 mg QAM, Hydralazine 25 mg TID (last dispensed in February 2025) -Was on Entresto 49-51 mg in 2022 for ~6 months  Pertinent Lab Values: Serum creatinine ***, BUN ***, Potassium ***, Sodium ***, BNP ***, Magnesium ***, A1c 6.1  Vital Signs: Weight: *** lbs (admission weight: 173.9 lbs) Blood pressure: ***  Heart rate: ***  I/O: net -***L yesterday; net -***L since admission  Medication Assistance / Insurance Benefits Check: Does the patient have prescription insurance?  Yes Type of insurance plan: Commercial BCBS  Does the patient qualify for medication assistance through manufacturers or grants?   {Yes/No/Pending:24180} Eligible grants and/or patient  assistance programs: *** Medication assistance applications in progress: ***  Medication assistance applications approved: *** Approved medication assistance renewals will be completed by: ***  Outpatient Pharmacy:  Prior to admission outpatient pharmacy:  [id number] - [place], [place] - [address] AT [facility] OF [address]   Is the patient willing to use [facility] TOC pharmacy at discharge? {Yes/No/Pending:24180} Is the patient willing to transition their outpatient pharmacy to utilize a [facility] outpatient pharmacy?   {Yes/No/Pending:24180}    Assessment: 1. Acute on ***chronic ***systolic CHF (LVEF 20-25%), due to ***. NYHA class *** symptoms. -    Plan: 1) Medication changes recommended at this time: -  2) Patient assistance: -  3)  Education  - To be completed prior to discharge  Sofie Rower, PharmD [employer] PGY-1

## 2024-01-18 NOTE — Progress Notes (Signed)
 Mobility Specialist Progress Note:   01/18/24 1400  Mobility  Activity Ambulated with assistance in hallway  Level of Assistance Standby assist, set-up cues, supervision of patient - no hands on  Assistive Device None  Distance Ambulated (ft) 400 ft  Activity Response Tolerated well  Mobility Referral Yes  Mobility visit 1 Mobility  Mobility Specialist Start Time (ACUTE ONLY) 1400  Mobility Specialist Stop Time (ACUTE ONLY) 1420  Mobility Specialist Time Calculation (min) (ACUTE ONLY) 20 min   Pt agreeable to mobility session. Required no physical assistance, only supervision for safety. Pt c/o pain in R foot from plantar fasciitis, resulting in limp. No unsteadiness noted even with limp. SpO2 96% on RA. Left sitting EOB with all needs met, on RA. RN notified.    Addison Lank Mobility Specialist Please contact via SecureChat or  Rehab office at [phone]

## 2024-01-18 NOTE — Plan of Care (Signed)

## 2024-01-18 NOTE — Plan of Care (Signed)
 Patient did very well today.  Weaned off O2 and ambulated today in hall.

## 2024-01-18 NOTE — Progress Notes (Signed)
 PROGRESS NOTE    [name]  HKV:[id number] DOB: 13-Sep-1961 DOA: 01/16/2024 PCP: Alysia Penna, MD  62/F with chronic systolic CHF, NICM, CKD 4, hypertension, paroxysmal A-fib on Eliquis, CVA presented to the ED with acute respiratory distress, patient reported progressive dyspnea on exertion X 1 month, ongoing cough for a few months, in the ED she was hypertensive, 190/130, tachypneic and tachycardic, placed on BiPAP, BNP 526, troponin 21, creatinine 2.6 which is her baseline, hemoglobin 11.1, chest x-ray with cardiomegaly noted no acute findings. -Admitted, placed on BiPAP and diuretics   Subjective: -Complains of congestion, runny nose, wheezing  Assessment and Plan:  Acute hypoxic respiratory failure Suspect URI with reactive airway disease -Significant upper airway congestion, wheezing earlier, poor air movement -Respiratory virus panel is negative -Add DuoNebs, IV steroids for today, supportive care -Wean O2 as tolerated  Acute on chronic systolic CHF -Last echo 1/25 with EF 20%, normal RV -Clinically appears only mildly volume overloaded initially, likely hypertensive urgency also contributing -Repeat echo largely unchanged -Now appears euvolemic, hold further IV diuretics today, resume oral Lasix tomorrow -Continue Imdur, hydralazine GDMT limited by CKD 4  CKD 4 -Mild uptrend but close to baseline, monitor  Paroxysmal A-fib -Rate controlled continue Coreg and Eliquis  History of CVA -Continue Eliquis and Lipitor  Hyperglycemia -A1c 6.1  Chronic normocytic anemia Monitor  DVT prophylaxis: Eliquis Code Status: Full code Family Communication: Family at bedside Disposition Plan: Home pending improvement in respiratory status  Consultants:    Procedures:   Antimicrobials:    Objective: Vitals:   01/18/24 0018 01/18/24 0437 01/18/24 0628 01/18/24 0746  BP: 125/86 (!) 148/92 (!) 141/96 (!) 150/92  Pulse:  87  96  Resp: 18 18  18   Temp: 98.5 F (36.9  C) 98.8 F (37.1 C)  99 F (37.2 C)  TempSrc: Oral Oral  Oral  SpO2: 97% 98%  98%  Weight:  76.2 kg    Height:        Intake/Output Summary (Last 24 hours) at 01/18/2024 1134 Last data filed at 01/18/2024 0749 Gross per 24 hour  Intake 480 ml  Output 300 ml  Net 180 ml   Filed Weights   01/16/24 1927 01/18/24 0437  Weight: 78.9 kg 76.2 kg    Examination:  General exam: Appears calm and comfortable, congested HEENT: No JVD Respiratory system: Poor air movement, rare expiratory wheezes Cardiovascular system: S1 & S2 heard, RRR.  Abd: nondistended, soft and nontender.Normal bowel sounds heard. Central nervous system: Alert and oriented. No focal neurological deficits. Extremities: no edema Skin: No rashes Psychiatry:  Mood & affect appropriate.     Data Reviewed:   CBC: Recent Labs  Lab 01/16/24 2008 01/16/24 2020 01/17/24 0411 01/18/24 0223  WBC 10.5  --  8.6 7.4  NEUTROABS 8.0*  --   --   --   HGB 11.2* 11.9* 10.3* 10.4*  HCT 34.6* 35.0* 32.1* 32.2*  MCV 97.7  --  96.7 97.0  PLT 223  --  220 221   Basic Metabolic Panel: Recent Labs  Lab 01/16/24 2008 01/16/24 2020 01/17/24 0411 01/18/24 0223  NA 139 140 140 142  K 4.6 4.6 4.9 4.8  CL 110  --  111 109  CO2 17*  --  20* 23  GLUCOSE 171*  --  115* 99  BUN 41*  --  40* 47*  CREATININE 2.60*  --  2.43* 2.88*  CALCIUM 8.8*  --  8.7* 8.9   GFR: Estimated Creatinine Clearance:  22.1 mL/min (A) (by C-G formula based on SCr of 2.88 mg/dL (H)). Liver Function Tests: Recent Labs  Lab 01/16/24 2008 01/17/24 0411 01/18/24 0223  AST 28 19 17   ALT 15 13 13   ALKPHOS 76 70 65  BILITOT 0.6 0.7 0.7  PROT 7.3 6.9 6.9  ALBUMIN 3.3* 3.1* 3.0*   No results for input(s): "LIPASE", "AMYLASE" in the last 168 hours. No results for input(s): "AMMONIA" in the last 168 hours. Coagulation Profile: No results for input(s): "INR", "PROTIME" in the last 168 hours. Cardiac Enzymes: No results for input(s): "CKTOTAL",  "CKMB", "CKMBINDEX", "TROPONINI" in the last 168 hours. BNP (last 3 results) No results for input(s): "PROBNP" in the last 8760 hours. HbA1C: Recent Labs    01/18/24 0223  HGBA1C 6.1*   CBG: No results for input(s): "GLUCAP" in the last 168 hours. Lipid Profile: No results for input(s): "CHOL", "HDL", "LDLCALC", "TRIG", "CHOLHDL", "LDLDIRECT" in the last 72 hours. Thyroid Function Tests: No results for input(s): "TSH", "T4TOTAL", "FREET4", "T3FREE", "THYROIDAB" in the last 72 hours. Anemia Panel: No results for input(s): "VITAMINB12", "FOLATE", "FERRITIN", "TIBC", "IRON", "RETICCTPCT" in the last 72 hours. Urine analysis:    Component Value Date/Time   COLORURINE YELLOW 01/18/2010 2053   APPEARANCEUR CLEAR 01/18/2010 2053   LABSPEC 1.022 01/18/2010 2053   PHURINE 5.0 01/18/2010 2053   GLUCOSEU NEGATIVE 01/18/2010 2053   GLUCOSEU NEGATIVE 12/09/2006 1110   HGBUR NEGATIVE 01/18/2010 2053   BILIRUBINUR NEGATIVE 01/18/2010 2053   KETONESUR NEGATIVE 01/18/2010 2053   PROTEINUR NEGATIVE 01/18/2010 2053   UROBILINOGEN 0.2 01/18/2010 2053   NITRITE NEGATIVE 01/18/2010 2053   LEUKOCYTESUR  01/18/2010 2053    NEGATIVE MICROSCOPIC NOT DONE ON URINES WITH NEGATIVE PROTEIN, BLOOD, LEUKOCYTES, NITRITE, OR GLUCOSE <1000 mg/dL.   Sepsis Labs: @LABRCNTIP (procalcitonin:4,lacticidven:4)  ) Recent Results (from the past 240 hours)  Resp panel by RT-PCR (RSV, Flu A&B, Covid) Anterior Nasal Swab     Status: None   Collection Time: 01/16/24  8:08 PM   Specimen: Anterior Nasal Swab  Result Value Ref Range Status   SARS Coronavirus 2 by RT PCR NEGATIVE NEGATIVE Final   Influenza A by PCR NEGATIVE NEGATIVE Final   Influenza B by PCR NEGATIVE NEGATIVE Final    Comment: (NOTE) The Xpert Xpress SARS-CoV-2/FLU/RSV plus assay is intended as an aid in the diagnosis of influenza from Nasopharyngeal swab specimens and should not be used as a sole basis for treatment. Nasal washings and aspirates are  unacceptable for Xpert Xpress SARS-CoV-2/FLU/RSV testing.  Fact Sheet for Patients: BloggerCourse.com  Fact Sheet for Healthcare Providers: SeriousBroker.it  This test is not yet approved or cleared by the [place] FDA and has been authorized for detection and/or diagnosis of SARS-CoV-2 by FDA under an Emergency Use Authorization (EUA). This EUA will remain in effect (meaning this test can be used) for the duration of the COVID-19 declaration under Section 564(b)(1) of the Act, 21 U.S.C. section 360bbb-3(b)(1), unless the authorization is terminated or revoked.     Resp Syncytial Virus by PCR NEGATIVE NEGATIVE Final    Comment: (NOTE) Fact Sheet for Patients: BloggerCourse.com  Fact Sheet for Healthcare Providers: SeriousBroker.it  This test is not yet approved or cleared by the [place] FDA and has been authorized for detection and/or diagnosis of SARS-CoV-2 by FDA under an Emergency Use Authorization (EUA). This EUA will remain in effect (meaning this test can be used) for the duration of the COVID-19 declaration under Section 564(b)(1) of the Act, 21 U.S.C.  section 360bbb-3(b)(1), unless the authorization is terminated or revoked.  Performed at [facility], [address] [place], [place] [address]   Respiratory (~20 pathogens) panel by PCR     Status: None   Collection Time: 01/16/24 11:41 PM   Specimen: Nasopharyngeal Swab; Respiratory  Result Value Ref Range Status   Adenovirus NOT DETECTED NOT DETECTED Final   Coronavirus 229E NOT DETECTED NOT DETECTED Final    Comment: (NOTE) The Coronavirus on the Respiratory Panel, DOES NOT test for the novel  Coronavirus (2019 nCoV)    Coronavirus HKU1 NOT DETECTED NOT DETECTED Final   Coronavirus NL63 NOT DETECTED NOT DETECTED Final   Coronavirus OC43 NOT DETECTED NOT DETECTED Final   Metapneumovirus NOT  DETECTED NOT DETECTED Final   Rhinovirus / Enterovirus NOT DETECTED NOT DETECTED Final   Influenza A NOT DETECTED NOT DETECTED Final   Influenza B NOT DETECTED NOT DETECTED Final   Parainfluenza Virus 1 NOT DETECTED NOT DETECTED Final   Parainfluenza Virus 2 NOT DETECTED NOT DETECTED Final   Parainfluenza Virus 3 NOT DETECTED NOT DETECTED Final   Parainfluenza Virus 4 NOT DETECTED NOT DETECTED Final   Respiratory Syncytial Virus NOT DETECTED NOT DETECTED Final   Bordetella pertussis NOT DETECTED NOT DETECTED Final   Bordetella Parapertussis NOT DETECTED NOT DETECTED Final   Chlamydophila pneumoniae NOT DETECTED NOT DETECTED Final   Mycoplasma pneumoniae NOT DETECTED NOT DETECTED Final    Comment: Performed at [facility], [address], [place], [place] [address]     Radiology Studies: ECHOCARDIOGRAM LIMITED Result Date: 01/17/2024    ECHOCARDIOGRAM LIMITED REPORT   Patient Name:   FRANSISCA SHAWN [facility] Date of Exam: 01/17/2024 Medical Rec #:  [id number]   Height:       67.0 in Accession #:    [id number]  Weight:       174.0 lb Date of Birth:  [birth year]  BSA:          1.906 m Patient Age:    62 years    BP:           137/89 mmHg Patient Gender: F           HR:           80 bpm. Exam Location:  Inpatient Procedure: Limited Echo and Limited Color Doppler (Both Spectral and Color Flow            Doppler were utilized during procedure). Indications:    CHF - Acute Systolic I50.21  History:        Patient has prior history of Echocardiogram examinations, most                 recent 01/02/2024. CHF and Cardiomyopathy, Stroke and CKD, stage                 4, Arrythmias:Atrial Fibrillation, Signs/Symptoms:Chest Pain and                 Dyspnea; Risk Factors:Hypertension and Dyslipidemia.  Sonographer:    Lucendia Herrlich RCS Referring Phys: [phone] SUBRINA SUNDIL IMPRESSIONS  1. Left ventricular ejection fraction, by estimation, is 20 to 25%. The left ventricle has severely decreased function. There is  moderate asymmetric left ventricular hypertrophy of the inferior segment. Left ventricular diastolic parameters are consistent with Grade I diastolic dysfunction (impaired relaxation).  2. Right ventricular systolic function is normal. The right ventricular size is normal. Tricuspid regurgitation signal is inadequate for assessing PA pressure.  3.  No evidence of mitral valve regurgitation.  4. The aortic valve was not well visualized. Aortic valve regurgitation is not visualized.  5. The inferior vena cava is normal in size with greater than 50% respiratory variability, suggesting right atrial pressure of 3 mmHg. Comparison(s): No significant change from prior study. FINDINGS  Left Ventricle: Left ventricular ejection fraction, by estimation, is 20 to 25%. The left ventricle has severely decreased function. There is moderate asymmetric left ventricular hypertrophy of the inferior segment. Left ventricular diastolic parameters  are consistent with Grade I diastolic dysfunction (impaired relaxation). Right Ventricle: The right ventricular size is normal. Right ventricular systolic function is normal. Tricuspid regurgitation signal is inadequate for assessing PA pressure. Left Atrium: Left atrial size was normal in size. Right Atrium: Right atrial size was normal in size. Pericardium: There is no evidence of pericardial effusion. Tricuspid Valve: Tricuspid valve regurgitation is trivial. Aortic Valve: The aortic valve was not well visualized. Aortic valve regurgitation is not visualized. Aortic valve peak gradient measures 11.6 mmHg. Venous: The inferior vena cava is normal in size with greater than 50% respiratory variability, suggesting right atrial pressure of 3 mmHg. Additional Comments: A device lead is visualized.  LEFT VENTRICLE PLAX 2D LVIDd:         4.50 cm LVIDs:         4.00 cm LV PW:         1.40 cm LV IVS:        1.10 cm LVOT diam:     2.00 cm LV SV:         48 LV SV Index:   25 LVOT Area:     3.14 cm  LV  Volumes (MOD) LV vol d, MOD A2C: 191.0 ml LV vol d, MOD A4C: 187.0 ml LV vol s, MOD A2C: 135.0 ml LV vol s, MOD A4C: 149.0 ml LV SV MOD A2C:     56.0 ml LV SV MOD A4C:     187.0 ml LV SV MOD BP:      48.2 ml IVC IVC diam: 1.90 cm LEFT ATRIUM         Index LA diam:    3.70 cm 1.94 cm/m  AORTIC VALVE AV Area (Vmax): 1.91 cm AV Vmax:        170.00 cm/s AV Peak Grad:   11.6 mmHg LVOT Vmax:      103.43 cm/s LVOT Vmean:     62.700 cm/s LVOT VTI:       0.153 m  AORTA Ao Root diam: 3.40 cm Ao Asc diam:  3.70 cm MITRAL VALVE MV Area (PHT): 4.53 cm     SHUNTS MV Decel Time: 168 msec     Systemic VTI:  0.15 m MV E velocity: 67.70 cm/s   Systemic Diam: 2.00 cm MV A velocity: 124.00 cm/s MV E/A ratio:  0.55 Photographer signed by Carolan Clines Signature Date/Time: 01/17/2024/12:25:01 PM    Final    DG Chest Portable 1 View Result Date: 01/16/2024 CLINICAL DATA:  Shortness of breath EXAM: PORTABLE CHEST 1 VIEW COMPARISON:  01/03/2023 FINDINGS: Left ICD remains in place, unchanged. Cardiomegaly. No confluent opacities, effusions or edema. No acute bony abnormality. IMPRESSION: Cardiomegaly.  No active disease. Electronically Signed   By: Charlett Nose M.D.   On: 01/16/2024 21:24     Scheduled Meds:  apixaban  5 mg Oral BID   atorvastatin  80 mg Oral Daily   benzonatate  200 mg Oral BID   carvedilol  25 mg Oral BID WC   guaiFENesin  600 mg Oral BID   hydrALAZINE  25 mg Oral Q8H   isosorbide mononitrate  30 mg Oral Daily   loratadine  10 mg Oral Daily   methylPREDNISolone (SOLU-MEDROL) injection  40 mg Intravenous Daily   sodium bicarbonate  650 mg Oral BID   sodium chloride flush  3 mL Intravenous Q12H   Continuous Infusions:     LOS: 2 days    Time spent:    Zannie Cove, MD [facility]   01/18/2024, 11:34 AM

## 2024-01-19 ENCOUNTER — Encounter (HOSPITAL_COMMUNITY): Payer: Self-pay | Admitting: Internal Medicine

## 2024-01-19 DIAGNOSIS — I5041 Acute combined systolic (congestive) and diastolic (congestive) heart failure: Secondary | ICD-10-CM | POA: Diagnosis not present

## 2024-01-19 LAB — BASIC METABOLIC PANEL
Anion gap: 10 (ref 5–15)
BUN: 56 mg/dL — ABNORMAL HIGH (ref 6–20)
CO2: 21 mmol/L — ABNORMAL LOW (ref 22–32)
Calcium: 8.9 mg/dL (ref 8.9–10.3)
Chloride: 106 mmol/L (ref 98–111)
Creatinine, Ser: 2.91 mg/dL — ABNORMAL HIGH (ref 0.44–1.00)
GFR, Estimated: 18 mL/min — ABNORMAL LOW (ref >60)
Glucose, Bld: 134 mg/dL — ABNORMAL HIGH (ref 70–99)
Potassium: 4.5 mmol/L (ref 3.5–5.1)
Sodium: 137 mmol/L (ref 135–145)

## 2024-01-19 LAB — CBC
HCT: 33.5 % — ABNORMAL LOW (ref 36.0–46.0)
Hemoglobin: 11 g/dL — ABNORMAL LOW (ref 12.0–15.0)
MCH: 31 pg (ref 26.0–34.0)
MCHC: 32.8 g/dL (ref 30.0–36.0)
MCV: 94.4 fL (ref 80.0–100.0)
Platelets: 244 10*3/uL (ref 150–400)
RBC: 3.55 MIL/uL — ABNORMAL LOW (ref 3.87–5.11)
RDW: 14.1 % (ref 11.5–15.5)
WBC: 7.6 10*3/uL (ref 4.0–10.5)
nRBC: 0 % (ref 0.0–0.2)

## 2024-01-19 MED ORDER — ISOSORBIDE MONONITRATE ER 30 MG PO TB24
15.0000 mg | ORAL_TABLET | Freq: Every day | ORAL | Status: DC
  Administered 2024-01-19 – 2024-01-20 (×2): 15 mg via ORAL
  Filled 2024-01-19 (×2): qty 1

## 2024-01-19 MED ORDER — HYDRALAZINE HCL 10 MG PO TABS
10.0000 mg | ORAL_TABLET | Freq: Three times a day (TID) | ORAL | Status: DC
  Administered 2024-01-19 – 2024-01-20 (×3): 10 mg via ORAL
  Filled 2024-01-19 (×3): qty 1

## 2024-01-19 MED ORDER — PREDNISONE 20 MG PO TABS
40.0000 mg | ORAL_TABLET | Freq: Every day | ORAL | Status: DC
  Administered 2024-01-19 – 2024-01-20 (×2): 40 mg via ORAL
  Filled 2024-01-19 (×2): qty 2

## 2024-01-19 NOTE — Progress Notes (Signed)
 PROGRESS NOTE    [name]  ZOX:[id number] DOB: [birth year] DOA: 01/16/2024 PCP: Alysia Penna, MD  62/F with chronic systolic CHF, NICM, CKD 4, hypertension, paroxysmal A-fib on Eliquis, CVA presented to the ED with acute respiratory distress, patient reported progressive dyspnea on exertion X 1 month, ongoing cough for a few months, in the ED she was hypertensive, 190/130, tachypneic and tachycardic, placed on BiPAP, BNP 526, troponin 21, creatinine 2.6 which is her baseline, hemoglobin 11.1, chest x-ray with cardiomegaly noted no acute findings. -Admitted, placed on BiPAP and diuretics -3/16, significant URI symptoms, wheezing, started on nebs, IV steroids   Subjective: -Feels much better overall, off oxygen this morning, still with sinus congestion, dyspnea has improved  Assessment and Plan:  Acute hypoxic respiratory failure Suspect URI with reactive airway disease -Significant upper airway congestion, wheezing, poor air movement -Respiratory virus panel is negative -Improving with IV steroids, nebs, Mucinex , switch to oral prednisone taper  -Wean off O2  Acute on chronic systolic CHF -Last echo 1/25 with EF 20%, normal RV -Clinically appears only mildly volume overloaded initially, likely hypertensive urgency also contributing -Repeat echo largely unchanged -She is euvolemic, creatinine slightly higher, restart oral Lasix tomorrow -Continue Imdur, hydralazine, hold off on aldactone w/ GFR GDMT limited by CKD 4  CKD 4 -Mild uptrend but close to baseline, monitor  Paroxysmal A-fib -Rate controlled continue Coreg and Eliquis  History of CVA -Continue Eliquis and Lipitor  Hyperglycemia -A1c 6.1  Chronic normocytic anemia Monitor  DVT prophylaxis: Eliquis Code Status: Full code Family Communication: Daughter at side Disposition Plan: Home tomorrow if stable  Consultants:    Procedures:   Antimicrobials:    Objective: Vitals:   01/19/24 0412 01/19/24  0649 01/19/24 0755 01/19/24 0938  BP:  (!) 146/97 (!) 151/85 (!) 142/90  Pulse:   92   Resp:   20   Temp:   98.2 F (36.8 C)   TempSrc:   Oral   SpO2:   98%   Weight: 75.9 kg     Height:        Intake/Output Summary (Last 24 hours) at 01/19/2024 1003 Last data filed at 01/19/2024 0755 Gross per 24 hour  Intake 390 ml  Output 650 ml  Net -260 ml   Filed Weights   01/16/24 1927 01/18/24 0437 01/19/24 0412  Weight: 78.9 kg 76.2 kg 75.9 kg    Examination:  General exam: Pleasant female sitting up in bed, AAOx3, facial and sinus congestion improving HEENT: No JVD CVS: S1-S2, regular rhythm Lungs: Improving air movement, no wheezes today Abdomen: Soft, nontender, bowel sounds present Extremities: no edema Skin: No rashes Psychiatry:  Mood & affect appropriate.     Data Reviewed:   CBC: Recent Labs  Lab 01/16/24 2008 01/16/24 2020 01/17/24 0411 01/18/24 0223 01/19/24 0254  WBC 10.5  --  8.6 7.4 7.6  NEUTROABS 8.0*  --   --   --   --   HGB 11.2* 11.9* 10.3* 10.4* 11.0*  HCT 34.6* 35.0* 32.1* 32.2* 33.5*  MCV 97.7  --  96.7 97.0 94.4  PLT 223  --  220 221 244   Basic Metabolic Panel: Recent Labs  Lab 01/16/24 2008 01/16/24 2020 01/17/24 0411 01/18/24 0223 01/19/24 0254  NA 139 140 140 142 137  K 4.6 4.6 4.9 4.8 4.5  CL 110  --  111 109 106  CO2 17*  --  20* 23 21*  GLUCOSE 171*  --  115* 99 134*  BUN 41*  --  40* 47* 56*  CREATININE 2.60*  --  2.43* 2.88* 2.91*  CALCIUM 8.8*  --  8.7* 8.9 8.9   GFR: Estimated Creatinine Clearance: 21.8 mL/min (A) (by C-G formula based on SCr of 2.91 mg/dL (H)). Liver Function Tests: Recent Labs  Lab 01/16/24 2008 01/17/24 0411 01/18/24 0223  AST 28 19 17   ALT 15 13 13   ALKPHOS 76 70 65  BILITOT 0.6 0.7 0.7  PROT 7.3 6.9 6.9  ALBUMIN 3.3* 3.1* 3.0*   No results for input(s): "LIPASE", "AMYLASE" in the last 168 hours. No results for input(s): "AMMONIA" in the last 168 hours. Coagulation Profile: No results  for input(s): "INR", "PROTIME" in the last 168 hours. Cardiac Enzymes: No results for input(s): "CKTOTAL", "CKMB", "CKMBINDEX", "TROPONINI" in the last 168 hours. BNP (last 3 results) No results for input(s): "PROBNP" in the last 8760 hours. HbA1C: Recent Labs    01/18/24 0223  HGBA1C 6.1*   CBG: No results for input(s): "GLUCAP" in the last 168 hours. Lipid Profile: No results for input(s): "CHOL", "HDL", "LDLCALC", "TRIG", "CHOLHDL", "LDLDIRECT" in the last 72 hours. Thyroid Function Tests: No results for input(s): "TSH", "T4TOTAL", "FREET4", "T3FREE", "THYROIDAB" in the last 72 hours. Anemia Panel: No results for input(s): "VITAMINB12", "FOLATE", "FERRITIN", "TIBC", "IRON", "RETICCTPCT" in the last 72 hours. Urine analysis:    Component Value Date/Time   COLORURINE YELLOW 01/18/2010 2053   APPEARANCEUR CLEAR 01/18/2010 2053   LABSPEC 1.022 01/18/2010 2053   PHURINE 5.0 01/18/2010 2053   GLUCOSEU NEGATIVE 01/18/2010 2053   GLUCOSEU NEGATIVE 12/09/2006 1110   HGBUR NEGATIVE 01/18/2010 2053   BILIRUBINUR NEGATIVE 01/18/2010 2053   KETONESUR NEGATIVE 01/18/2010 2053   PROTEINUR NEGATIVE 01/18/2010 2053   UROBILINOGEN 0.2 01/18/2010 2053   NITRITE NEGATIVE 01/18/2010 2053   LEUKOCYTESUR  01/18/2010 2053    NEGATIVE MICROSCOPIC NOT DONE ON URINES WITH NEGATIVE PROTEIN, BLOOD, LEUKOCYTES, NITRITE, OR GLUCOSE <1000 mg/dL.   Sepsis Labs: @LABRCNTIP (procalcitonin:4,lacticidven:4)  ) Recent Results (from the past 240 hours)  Resp panel by RT-PCR (RSV, Flu A&B, Covid) Anterior Nasal Swab     Status: None   Collection Time: 01/16/24  8:08 PM   Specimen: Anterior Nasal Swab  Result Value Ref Range Status   SARS Coronavirus 2 by RT PCR NEGATIVE NEGATIVE Final   Influenza A by PCR NEGATIVE NEGATIVE Final   Influenza B by PCR NEGATIVE NEGATIVE Final    Comment: (NOTE) The Xpert Xpress SARS-CoV-2/FLU/RSV plus assay is intended as an aid in the diagnosis of influenza from  Nasopharyngeal swab specimens and should not be used as a sole basis for treatment. Nasal washings and aspirates are unacceptable for Xpert Xpress SARS-CoV-2/FLU/RSV testing.  Fact Sheet for Patients: BloggerCourse.com  Fact Sheet for Healthcare Providers: SeriousBroker.it  This test is not yet approved or cleared by the [place] FDA and has been authorized for detection and/or diagnosis of SARS-CoV-2 by FDA under an Emergency Use Authorization (EUA). This EUA will remain in effect (meaning this test can be used) for the duration of the COVID-19 declaration under Section 564(b)(1) of the Act, 21 U.S.C. section 360bbb-3(b)(1), unless the authorization is terminated or revoked.     Resp Syncytial Virus by PCR NEGATIVE NEGATIVE Final    Comment: (NOTE) Fact Sheet for Patients: BloggerCourse.com  Fact Sheet for Healthcare Providers: SeriousBroker.it  This test is not yet approved or cleared by the [place] FDA and has been authorized for detection and/or diagnosis of SARS-CoV-2 by FDA under  an Emergency Use Authorization (EUA). This EUA will remain in effect (meaning this test can be used) for the duration of the COVID-19 declaration under Section 564(b)(1) of the Act, 21 U.S.C. section 360bbb-3(b)(1), unless the authorization is terminated or revoked.  Performed at [facility], [address] [place], [place] [address]   Respiratory (~20 pathogens) panel by PCR     Status: None   Collection Time: 01/16/24 11:41 PM   Specimen: Nasopharyngeal Swab; Respiratory  Result Value Ref Range Status   Adenovirus NOT DETECTED NOT DETECTED Final   Coronavirus 229E NOT DETECTED NOT DETECTED Final    Comment: (NOTE) The Coronavirus on the Respiratory Panel, DOES NOT test for the novel  Coronavirus (2019 nCoV)    Coronavirus HKU1 NOT DETECTED NOT DETECTED Final    Coronavirus NL63 NOT DETECTED NOT DETECTED Final   Coronavirus OC43 NOT DETECTED NOT DETECTED Final   Metapneumovirus NOT DETECTED NOT DETECTED Final   Rhinovirus / Enterovirus NOT DETECTED NOT DETECTED Final   Influenza A NOT DETECTED NOT DETECTED Final   Influenza B NOT DETECTED NOT DETECTED Final   Parainfluenza Virus 1 NOT DETECTED NOT DETECTED Final   Parainfluenza Virus 2 NOT DETECTED NOT DETECTED Final   Parainfluenza Virus 3 NOT DETECTED NOT DETECTED Final   Parainfluenza Virus 4 NOT DETECTED NOT DETECTED Final   Respiratory Syncytial Virus NOT DETECTED NOT DETECTED Final   Bordetella pertussis NOT DETECTED NOT DETECTED Final   Bordetella Parapertussis NOT DETECTED NOT DETECTED Final   Chlamydophila pneumoniae NOT DETECTED NOT DETECTED Final   Mycoplasma pneumoniae NOT DETECTED NOT DETECTED Final    Comment: Performed at [facility], [address] [place], [place] [address]     Radiology Studies: No results found.    Scheduled Meds:  apixaban  5 mg Oral BID   atorvastatin  80 mg Oral Daily   benzonatate  200 mg Oral BID   carvedilol  25 mg Oral BID WC   guaiFENesin  600 mg Oral BID   hydrALAZINE  10 mg Oral Q8H   isosorbide mononitrate  15 mg Oral Daily   loratadine  10 mg Oral Daily   predniSONE  40 mg Oral Q breakfast   sodium bicarbonate  650 mg Oral BID   sodium chloride flush  3 mL Intravenous Q12H   Continuous Infusions:     LOS: 3 days    Time spent:    Zannie Cove, MD [facility]   01/19/2024, 10:03 AM

## 2024-01-19 NOTE — TOC Initial Note (Signed)
 Transition of Care [facility]) - Initial/Assessment Note    Patient Details  Name: [name] MRN: [id number] Date of Birth: 27-Dec-1960  Transition of Care [facility]) CM/SW Contact:    Leone Haven, RN Phone Number: 01/19/2024, 4:10 PM  Clinical Narrative:                 From home with nephew, has PCP and insurance on file, states has no HH services in place at this time or DME at home.  States nephew or daughter  will transport them home at [employer] and they are support system, states gets medications from [facility] on [address].  Pta self ambulatory.   Expected Discharge Plan: Home/Self Care Barriers to Discharge: Continued Medical Work up   Patient Goals and CMS Choice Patient states their goals for this hospitalization and ongoing recovery are:: return home   Choice offered to / list presented to : NA      Expected Discharge Plan and Services In-house Referral: NA Discharge Planning Services: CM Consult Post Acute Care Choice: NA Living arrangements for the past 2 months: Single Family Home                   DME Agency: NA       HH Arranged: NA          Prior Living Arrangements/Services Living arrangements for the past 2 months: Single Family Home Lives with:: Relatives (nephew) Patient language and need for interpreter reviewed:: Yes Do you feel safe going back to the place where you live?: Yes      Need for Family Participation in Patient Care: No (Comment) Care giver support system in place?: Yes (comment)   Criminal Activity/Legal Involvement Pertinent to Current Situation/Hospitalization: No - Comment as needed  Activities of Daily Living   ADL Screening (condition at time of admission) Independently performs ADLs?: Yes (appropriate for developmental age) Is the patient deaf or have difficulty hearing?: No Does the patient have difficulty seeing, even when wearing glasses/contacts?: No Does the patient have difficulty concentrating, remembering, or  making decisions?: No  Permission Sought/Granted Permission sought to share information with : Case Manager Permission granted to share information with : Yes, Verbal Permission Granted              Emotional Assessment Appearance:: Appears stated age Attitude/Demeanor/Rapport: Engaged Affect (typically observed): Appropriate Orientation: : Oriented to Self, Oriented to Place, Oriented to  Time, Oriented to Situation Alcohol / Substance Use: Not Applicable Psych Involvement: No (comment)  Admission diagnosis:  Shortness of breath [R06.02] Acute exacerbation of CHF (congestive heart failure) (HCC) [I50.9] Patient Active Problem List   Diagnosis Date Noted   Acute combined systolic and diastolic congestive heart failure (HCC) 01/16/2024   CKD (chronic kidney disease), stage IV (HCC) 01/16/2024   History of CVA (cerebrovascular accident) 01/16/2024   Metabolic acidosis 01/16/2024   Acute exacerbation of CHF (congestive heart failure) (HCC) 01/16/2024   URI (upper respiratory infection) 01/16/2024   Paroxysmal A-fib (HCC) 02/04/2023   Hypercoagulable state due to paroxysmal atrial fibrillation (HCC) 02/04/2023   Acute ischemic stroke (HCC) 12/13/2021   Hyperlipidemia 02/05/2019   Advice given about COVID-19 virus infection 02/05/2019   CKD (chronic kidney disease), stage III (HCC) 02/03/2018   Insomnia 10/14/2014   At risk for sudden cardiac death 08/28/14   Chronic systolic HF (heart failure) (HCC) 2014-08-28   Essential hypertension, malignant 08/08/2014   Non compliance with medical treatment 08/07/2014   Normal coronary arteries 2006 08/07/2014  Renal insufficiency- unclear if chronic or not 08/07/2014   Acute on chronic systolic CHF (congestive heart failure), NYHA class 4 (HCC) 08/06/2014   DYSPNEA 07/04/2010   Chest pain- MI r/o 05/24/2009   Obesity (BMI 30-39.9) 02/13/2009   Essential hypertension 02/13/2009   NICM- EF 25-30% echo 08/05/14 02/13/2009   PCP:   Alysia Penna, MD Pharmacy:   [facility] [phone] - [place], [place] - [address] AT [facility] OF [address] [place] [address] Phone: [phone] Fax: [phone]     Social Drivers of Health (SDOH) Social History: SDOH Screenings   Food Insecurity: No Food Insecurity (01/17/2024)  Housing: Low Risk  (01/19/2024)  Transportation Needs: No Transportation Needs (01/19/2024)  Utilities: Not At Risk (01/17/2024)  Alcohol Screen: Low Risk  (01/19/2024)  Financial Resource Strain: Low Risk  (01/19/2024)  Tobacco Use: Low Risk  (01/16/2024)   SDOH Interventions: Housing Interventions: Intervention Not Indicated Transportation Interventions: Intervention Not Indicated Alcohol Usage Interventions: Intervention Not Indicated (Score <7) Financial Strain Interventions: Intervention Not Indicated   Readmission Risk Interventions    01/19/2024    4:08 PM  Readmission Risk Prevention Plan  Medication Screening Complete  Transportation Screening Complete

## 2024-01-19 NOTE — Progress Notes (Signed)
 Mobility Specialist Progress Note:   01/19/24 1140  Mobility  Activity Ambulated independently in hallway  Level of Assistance Modified independent, requires aide device or extra time  Assistive Device None  Distance Ambulated (ft) 400 ft  Activity Response Tolerated well  Mobility Referral Yes  Mobility visit 1 Mobility  Mobility Specialist Start Time (ACUTE ONLY) 1140  Mobility Specialist Stop Time (ACUTE ONLY) 1155  Mobility Specialist Time Calculation (min) (ACUTE ONLY) 15 min   Pt agreeable to mobility session. Required no physical assistance throughout ambulation with no AD use. Plantar fascitis pain significantly improved, no limp displayed this session. SpO2 93% on RA at lowest. No c/o throughout, pt back in bed with all needs met.   Addison Lank Mobility Specialist Please contact via SecureChat or  Rehab office at [phone]

## 2024-01-19 NOTE — Progress Notes (Signed)
 Heart Failure Nurse Navigator Progress Note  PCP: Alysia Penna, MD PCP-Cardiologist: Hochrein Admission Diagnosis: Shortness of breath Admitted from: Home via EMS  Presentation:   [name] presented with respiratory distress via EMS. Reports a cough and congestion over last couple of days, Initial O2 saturations were 77 % on RA, placed on NRB improved to 96%. CPAP and nitroglycerin administered en route. BP 180/130, HR 110. BNP 236.5, CBG 171, CXR with cardiomegaly.   Patient and her daughter were educated on the sign and symptoms of heart failure, daily weights, when to call her doctor or go to the ED, Diet/ fluid restrictions ( patient does report to drinking at least 2 beers per evening)  Continued education of taking all medications as prescribed and attending all medical appointments. Patient and daughter verbalized their understanding of education. A HF TOC appointment was scheduled for 01/26/2024 @ 2:45 pm.   ECHO/ LVEF: 20-25%  Clinical Course:  Past Medical History:  Diagnosis Date   CHF (congestive heart failure) (HCC)    Hypertension    Overweight(278.02)    Sciatica    Secondary cardiomyopathy, unspecified    EF now 50%     Social History   Socioeconomic History   Marital status: Single    Spouse name: Not on file   Number of children: Not on file   Years of education: Not on file   Highest education level: Not on file  Occupational History   Not on file  Tobacco Use   Smoking status: Never   Smokeless tobacco: Never   Tobacco comments:    Never smoke 03/07/23  Substance and Sexual Activity   Alcohol use: Yes    Alcohol/week: 7.0 - 14.0 standard drinks of alcohol    Types: 7 - 14 Cans of beer per week    Comment: 1-2 beers daily 03/07/23   Drug use: No   Sexual activity: Yes    Birth control/protection: Surgical  Other Topics Concern   Not on file  Social History Narrative   Not on file   Social Drivers of Health   Financial Resource Strain:  Not on file  Food Insecurity: No Food Insecurity (01/17/2024)   Hunger Vital Sign    Worried About Running Out of Food in the Last Year: Never true    Ran Out of Food in the Last Year: Never true  Transportation Needs: No Transportation Needs (01/17/2024)   PRAPARE - Administrator, Civil Service (Medical): No    Lack of Transportation (Non-Medical): No  Physical Activity: Not on file  Stress: Not on file  Social Connections: Not on file   Education Assessment and Provision:  Detailed education and instructions provided on heart failure disease management including the following:  Signs and symptoms of Heart Failure When to call the physician Importance of daily weights Low sodium diet Fluid restriction Medication management Anticipated future follow-up appointments  Patient education given on each of the above topics.  Patient acknowledges understanding via teach back method and acceptance of all instructions.  Education Materials:  "Living Better With Heart Failure" Booklet, HF zone tool, & Daily Weight Tracker Tool.  Patient has scale at home: Yes Patient has pill box at home: NA    High Risk Criteria for Readmission and/or Poor Patient Outcomes: Heart failure hospital admissions (last 6 months): 0  No Show rate: 13 % Difficult social situation: No, lives with her Nephew Demonstrates medication adherence: Yes Primary Language: English Literacy level: Reading, writing, and  comprehension  Barriers of Care:   Diet/ fluid restrictions ( totals for day) Daily weights  Considerations/Referrals:   Referral made to Heart Failure Pharmacist Stewardship: Yes Referral made to Heart Failure CSW/NCM TOC: No Referral made to Heart & Vascular TOC clinic: Yes, 01/26/2024 @ 2:45 pm.   Items for Follow-up on DC/TOC: Continued HF education Diet/ fluid restrictions ( total fluid amount) Daily weights   Rhae Hammock, BSN, RN Heart Failure Teacher, adult education  Only

## 2024-01-19 NOTE — Plan of Care (Signed)
   Problem: Education: Goal: Knowledge of General Education information will improve Description: Including pain rating scale, medication(s)/side effects and non-pharmacologic comfort measures Outcome: Progressing   Problem: Clinical Measurements: Goal: Will remain free from infection Outcome: Progressing   Problem: Clinical Measurements: Goal: Respiratory complications will improve Outcome: Progressing

## 2024-01-20 ENCOUNTER — Other Ambulatory Visit (HOSPITAL_COMMUNITY): Payer: Self-pay

## 2024-01-20 DIAGNOSIS — I5041 Acute combined systolic (congestive) and diastolic (congestive) heart failure: Secondary | ICD-10-CM | POA: Diagnosis not present

## 2024-01-20 LAB — BASIC METABOLIC PANEL
Anion gap: 12 (ref 5–15)
BUN: 65 mg/dL — ABNORMAL HIGH (ref 6–20)
CO2: 22 mmol/L (ref 22–32)
Calcium: 8.9 mg/dL (ref 8.9–10.3)
Chloride: 105 mmol/L (ref 98–111)
Creatinine, Ser: 2.61 mg/dL — ABNORMAL HIGH (ref 0.44–1.00)
GFR, Estimated: 20 mL/min — ABNORMAL LOW (ref >60)
Glucose, Bld: 119 mg/dL — ABNORMAL HIGH (ref 70–99)
Potassium: 4.8 mmol/L (ref 3.5–5.1)
Sodium: 139 mmol/L (ref 135–145)

## 2024-01-20 MED ORDER — FUROSEMIDE 20 MG PO TABS
20.0000 mg | ORAL_TABLET | Freq: Every day | ORAL | 1 refills | Status: DC
  Filled 2024-01-20: qty 30, 30d supply, fill #0

## 2024-01-20 MED ORDER — ISOSORBIDE MONONITRATE ER 30 MG PO TB24
30.0000 mg | ORAL_TABLET | Freq: Every day | ORAL | 1 refills | Status: DC
Start: 2024-01-20 — End: 2024-02-06
  Filled 2024-01-20: qty 30, 30d supply, fill #0

## 2024-01-20 MED ORDER — BENZONATATE 200 MG PO CAPS
200.0000 mg | ORAL_CAPSULE | Freq: Two times a day (BID) | ORAL | 0 refills | Status: AC
  Filled 2024-01-20: qty 10, 5d supply, fill #0

## 2024-01-20 MED ORDER — PREDNISONE 20 MG PO TABS
20.0000 mg | ORAL_TABLET | Freq: Every day | ORAL | 0 refills | Status: AC
  Filled 2024-01-20: qty 3, 3d supply, fill #0

## 2024-01-20 MED ORDER — GUAIFENESIN ER 600 MG PO TB12
600.0000 mg | ORAL_TABLET | Freq: Two times a day (BID) | ORAL | 0 refills | Status: DC
  Filled 2024-01-20: qty 10, 5d supply, fill #0

## 2024-01-20 NOTE — Plan of Care (Signed)
  Problem: Education: Goal: Knowledge of General Education information will improve Description: Including pain rating scale, medication(s)/side effects and non-pharmacologic comfort measures 01/20/2024 1007 by Collene Gobble, RN Outcome: Adequate for Discharge 01/20/2024 1001 by Collene Gobble, RN Outcome: Progressing   Problem: Health Behavior/Discharge Planning: Goal: Ability to manage health-related needs will improve 01/20/2024 1007 by Collene Gobble, RN Outcome: Adequate for Discharge 01/20/2024 1001 by Collene Gobble, RN Outcome: Progressing   Problem: Clinical Measurements: Goal: Ability to maintain clinical measurements within normal limits will improve 01/20/2024 1007 by Collene Gobble, RN Outcome: Adequate for Discharge 01/20/2024 1001 by Collene Gobble, RN Outcome: Progressing Goal: Will remain free from infection 01/20/2024 1007 by Collene Gobble, RN Outcome: Adequate for Discharge 01/20/2024 1001 by Collene Gobble, RN Outcome: Progressing Goal: Diagnostic test results will improve 01/20/2024 1007 by Collene Gobble, RN Outcome: Adequate for Discharge 01/20/2024 1001 by Collene Gobble, RN Outcome: Progressing Goal: Respiratory complications will improve 01/20/2024 1007 by Collene Gobble, RN Outcome: Adequate for Discharge 01/20/2024 1001 by Collene Gobble, RN Outcome: Progressing Goal: Cardiovascular complication will be avoided 01/20/2024 1007 by Collene Gobble, RN Outcome: Adequate for Discharge 01/20/2024 1001 by Collene Gobble, RN Outcome: Progressing   Problem: Activity: Goal: Risk for activity intolerance will decrease 01/20/2024 1007 by Collene Gobble, RN Outcome: Adequate for Discharge 01/20/2024 1001 by Collene Gobble, RN Outcome: Progressing   Problem: Nutrition: Goal: Adequate nutrition will be maintained 01/20/2024 1007 by Collene Gobble, RN Outcome: Adequate for Discharge 01/20/2024 1001 by Collene Gobble, RN Outcome: Progressing    Problem: Coping: Goal: Level of anxiety will decrease 01/20/2024 1007 by Collene Gobble, RN Outcome: Adequate for Discharge 01/20/2024 1001 by Collene Gobble, RN Outcome: Progressing   Problem: Elimination: Goal: Will not experience complications related to bowel motility 01/20/2024 1007 by Collene Gobble, RN Outcome: Adequate for Discharge 01/20/2024 1001 by Collene Gobble, RN Outcome: Progressing Goal: Will not experience complications related to urinary retention 01/20/2024 1007 by Collene Gobble, RN Outcome: Adequate for Discharge 01/20/2024 1001 by Collene Gobble, RN Outcome: Progressing   Problem: Pain Managment: Goal: General experience of comfort will improve and/or be controlled 01/20/2024 1007 by Collene Gobble, RN Outcome: Adequate for Discharge 01/20/2024 1001 by Collene Gobble, RN Outcome: Progressing   Problem: Safety: Goal: Ability to remain free from injury will improve 01/20/2024 1007 by Collene Gobble, RN Outcome: Adequate for Discharge 01/20/2024 1001 by Collene Gobble, RN Outcome: Progressing   Problem: Skin Integrity: Goal: Risk for impaired skin integrity will decrease 01/20/2024 1007 by Collene Gobble, RN Outcome: Adequate for Discharge 01/20/2024 1001 by Collene Gobble, RN Outcome: Progressing

## 2024-01-20 NOTE — Plan of Care (Signed)

## 2024-01-20 NOTE — Plan of Care (Signed)

## 2024-01-20 NOTE — Progress Notes (Signed)
 Heart Failure Stewardship Pharmacist Progress Note   PCP: Alysia Penna, MD PCP-Cardiologist: Rollene Rotunda, MD    HPI:  63 y.o. female with PMHx HTN, CKD (stage IV), ischemic stroke, paroxysmal A fib, insomnia, and CHF.   [name] arrived to [facility] ED on 03/14 with complaints of SOB since January 2025 that worsened that morning. She called EMS at was found to have O2 sat of 77%. She was put on non-rebreather and O2 sat improved to 96%. She was given NTG. BP was 180/130 and she was tachycardic en route to ED. She told FM resident she was taking Furosemide 20 mg PRN and spironolactone. She also reported some cough and congestion over the last couple of days BNP 526. CXR on 03/14 showed cardiomegaly and no active disease. ECHO on LVEF of 20-25% (20% in February 2025), LV has severely decreased function, moderate asymmetric LVH, GI DD, and right atrial pressure of 3 mmHg. Transitioned off HFNC on 03/16. Now on room air.   Today she states she is feeling much better. No SOB or DOE. She is excited to go home. She understands to stop Spironolactone 25 mg when she returns home.   Current HF Medications: -Beta Blocker: Carvedilol 25 mg BID -Other: Hydralazine 10 mg Q8H and Imdur 15 mg every day   Prior to admission HF Medications: -Diuretic: Furosemide 20 mg every day PRN -Beta Blocker: Carvedilol 25 mg BID (last dispensed Jan 2025 for 30 ds) -MRA: Spironolactone 25 mg every day (last dispensed Jan 2025 for 90 ds) -Other: Isorsobide mononitrate 120 mg QAM, Hydralazine 25 mg TID (last dispensed in February 2025) -Lipitor 80 mg  -Was on Entresto 49-51 mg in 2022 for ~6 months stopped d/t AKI  Pertinent Lab Values: Serum creatinine 2.61(<2.91), BUN 65(<56), Potassium 4.8(<4.5), Sodium 139(<137), BNP 236.5, Magnesium N/A, A1c 6.1 eGFR 20  Vital Signs: Weight: 169.98.3 lbs (admission weight: 173.9 lbs) Blood pressure: 127/86-154/86 (130/74 @ 0753)  Heart rate: 28-93 (93 @ 0753)  I/O: net -.07 L  yesterday; net +.2 L since admission  Medication Assistance / Insurance Benefits Check: Does the patient have prescription insurance?  Yes Type of insurance plan: Psychiatric nurse  Outpatient Pharmacy:  Prior to admission outpatient pharmacy:  [facility] #[id number] - [place], [place] - [address] [facility] [address]   Is the patient willing to use [facility] TOC pharmacy at discharge? Yes Is the patient willing to transition their outpatient pharmacy to utilize a [facility] outpatient pharmacy?   Yes    Assessment: 1. Acute on chronic CHF (LVEF 20-25%). NYHA class I symptoms. -Continue Lasix 20 mg every day at discharge -Stop Spironolactone at discharge -Continue Carvedilol 25 mg BID at discharge -Restart Hydralazine 25 mg TID at discharge -Continue Imdur 30 mg every day discharge  -Consider initiating Farxiga at [facility] appointment   Plan: 1) Medication changes recommended at this time: -Restart Hydralazine 25 mg TID  -Stop Spironolactone at discharge -Consider initiating Farxiga at [facility] appointment   2) Patient assistance: -Patient eligible for [place] co-pay card ($0 co-pay)  3)  Education  - Patient has been educated on current HF medications and potential additions to HF medication regimen - Patient verbalizes understanding that over the next few months, these medication doses may change and more medications may be added to optimize HF regimen - Patient has been educated on basic disease state pathophysiology and goals of therapy   Patient will follow up with [facility] on 01/26/2024.  Sofie Rower, PharmD [facility] PGY-1

## 2024-01-20 NOTE — Plan of Care (Signed)
  Problem: Clinical Measurements: Goal: Will remain free from infection Outcome: Progressing Goal: Respiratory complications will improve Outcome: Progressing   Problem: Activity: Goal: Risk for activity intolerance will decrease Outcome: Progressing   

## 2024-01-20 NOTE — Discharge Summary (Signed)
 Physician Discharge Summary  [name] UEA:[id number] DOB: 1961/07/29 DOA: 01/16/2024  PCP: Alysia Penna, MD  Admit date: 01/16/2024 Discharge date: 01/20/2024  Time spent: 45 minutes  Recommendations for Outpatient Follow-up:  [facility] in 2weeks Nephrology [facility] in 26month   Discharge Diagnoses:  Principal Problem:   Acute combined systolic and diastolic congestive heart failure (HCC) Active Problems:   Essential hypertension   NICM- EF 25-30% echo 08/05/14   Hyperlipidemia   Paroxysmal A-fib (HCC)   CKD (chronic kidney disease), stage IV (HCC)   History of CVA (cerebrovascular accident)   Metabolic acidosis   Acute exacerbation of CHF (congestive heart failure) (HCC)   URI (upper respiratory infection)   Discharge Condition: improved  Diet recommendation: Renal  Filed Weights   01/18/24 0437 01/19/24 0412 01/20/24 0518  Weight: 76.2 kg 75.9 kg 77.1 kg    History of present illness:  62/F with chronic systolic CHF, NICM, CKD 4, hypertension, paroxysmal A-fib on Eliquis, CVA presented to the ED with acute respiratory distress, patient reported progressive dyspnea on exertion X 1 month, ongoing cough for a few months, in the ED she was hypertensive, 190/130, tachypneic and tachycardic, placed on BiPAP, BNP 526, troponin 21, creatinine 2.6 which is her baseline, hemoglobin 11.1, chest x-ray with cardiomegaly noted no acute findings. -Admitted, placed on BiPAP and diuretics -3/16, significant URI symptoms, wheezing, started on nebs, IV steroids  Hospital Course:   Acute hypoxic respiratory failure URI with reactive airway disease -Significant upper airway congestion, wheezing, poor air movement -Respiratory virus panel is negative -Improved on IV steroids, nebs, Mucinex , switched to oral prednisone taper  -Weaned off O2   Acute on chronic systolic CHF -Last echo 1/25 with EF 20%, normal RV -Repeat echo largely unchanged -She is euvolemic, Lasix 40 Mg daily  resumed at discharge -Continue Imdur, hydralazine, hold off on aldactone w/ borderline GFR GDMT limited by CKD 4   CKD 4 -Mild uptrend yesterday, now stabilizing, resumed Lasix -Follow-up with nephrology   Paroxysmal A-fib -Rate controlled continue Coreg and Eliquis   History of CVA -Continue Eliquis and Lipitor   Hyperglycemia -A1c 6.1   Chronic normocytic anemia Monitor  Discharge Exam: Vitals:   01/20/24 0518 01/20/24 0753  BP: (!) 154/86 130/74  Pulse: 73 93  Resp: 18 18  Temp: 98.5 F (36.9 C) 97.7 F (36.5 C)  SpO2:  100%    General exam: Pleasant female sitting up in bed, AAOx3, facial and sinus congestion improving HEENT: No JVD CVS: S1-S2, regular rhythm Lungs: Improving air movement, no wheezes today Abdomen: Soft, nontender, bowel sounds present Extremities: no edema Skin: No rashes Psychiatry:  Mood & affect appropriate.     Discharge Instructions   Discharge Instructions     Diet - low sodium heart healthy   Complete by: As directed    Increase activity slowly   Complete by: As directed       Allergies as of 01/20/2024       Reactions   Latex Itching   Chlorhexidine Gluconate Hives   Unknown if this is the cause of the hives, raised areas around lab draw sites. No tape and area wiped with chlorhexidine.        Medication List     STOP taking these medications    spironolactone 25 MG tablet Commonly known as: ALDACTONE       TAKE these medications    albuterol 108 (90 Base) MCG/ACT inhaler Commonly known as: VENTOLIN HFA Inhale 1  puff into the lungs every 6 (six) hours as needed.   atorvastatin 80 MG tablet Commonly known as: LIPITOR Take 1 tablet (80 mg total) by mouth daily.   benzonatate 200 MG capsule Commonly known as: TESSALON Take 1 capsule (200 mg total) by mouth 2 (two) times daily for 5 days.   carvedilol 25 MG tablet Commonly known as: COREG Take 1 tablet (25 mg total) by mouth 2 (two) times daily with a  meal.   Eliquis 5 MG Tabs tablet Generic drug: apixaban TAKE 1 TABLET(5 MG) BY MOUTH TWICE DAILY   furosemide 20 MG tablet Commonly known as: LASIX Take 1 tablet (20 mg total) by mouth daily. What changed:  when to take this reasons to take this   hydrALAZINE 25 MG tablet Commonly known as: APRESOLINE TAKE 1 TABLET(25 MG) BY MOUTH THREE TIMES DAILY   HYDROcodone-acetaminophen 5-325 MG tablet Commonly known as: NORCO/VICODIN Take 1 tablet by mouth daily as needed for severe pain (pain score 7-10).   isosorbide mononitrate 30 MG 24 hr tablet Commonly known as: IMDUR Take 1 tablet (30 mg total) by mouth daily. What changed:  medication strength See the new instructions.   predniSONE 20 MG tablet Commonly known as: DELTASONE Take 1 tablet (20 mg total) by mouth daily with breakfast for 3 days. Start taking on: January 21, 2024   TYLENOL PO Take 1,000 mg by mouth daily as needed (for pain).   zolpidem 5 MG tablet Commonly known as: AMBIEN Take 5 mg by mouth at bedtime as needed for sleep.       Allergies  Allergen Reactions   Latex Itching   Chlorhexidine Gluconate Hives    Unknown if this is the cause of the hives, raised areas around lab draw sites. No tape and area wiped with chlorhexidine.    Follow-up Information     [facility]. Go in 6 day(s).   Specialty: Cardiology Why: Hospital follow up 01/26/2024 @ 2:45 pm PLEASE bring a current medication list to appointment FREE valet parking, Entrance C, off [employer] information: [address] [place] [address] [phone]        Alysia Penna, MD. Call.   Specialty: Internal Medicine Contact information: [address] [place] [address] [phone]                  The results of significant diagnostics from this hospitalization (including imaging, microbiology, ancillary and laboratory) are listed below  for reference.    Significant Diagnostic Studies: ECHOCARDIOGRAM LIMITED Result Date: 01/17/2024    ECHOCARDIOGRAM LIMITED REPORT   Patient Name:   [name] [facility] Date of Exam: 01/17/2024 Medical Rec #:  [id number]   Height:       67.0 in Accession #:    [id number]  Weight:       174.0 lb Date of Birth:  1961-11-03  BSA:          1.906 m Patient Age:    62 years    BP:           137/89 mmHg Patient Gender: F           HR:           80 bpm. Exam Location:  Inpatient Procedure: Limited Echo and Limited Color Doppler (Both Spectral and Color Flow            Doppler were utilized during procedure). Indications:    CHF -  Acute Systolic I50.21  History:        Patient has prior history of Echocardiogram examinations, most                 recent 01/02/2024. CHF and Cardiomyopathy, Stroke and CKD, stage                 4, Arrythmias:Atrial Fibrillation, Signs/Symptoms:Chest Pain and                 Dyspnea; Risk Factors:Hypertension and Dyslipidemia.  Sonographer:    Lucendia Herrlich RCS Referring Phys: [phone] SUBRINA SUNDIL IMPRESSIONS  1. Left ventricular ejection fraction, by estimation, is 20 to 25%. The left ventricle has severely decreased function. There is moderate asymmetric left ventricular hypertrophy of the inferior segment. Left ventricular diastolic parameters are consistent with Grade I diastolic dysfunction (impaired relaxation).  2. Right ventricular systolic function is normal. The right ventricular size is normal. Tricuspid regurgitation signal is inadequate for assessing PA pressure.  3. No evidence of mitral valve regurgitation.  4. The aortic valve was not well visualized. Aortic valve regurgitation is not visualized.  5. The inferior vena cava is normal in size with greater than 50% respiratory variability, suggesting right atrial pressure of 3 mmHg. Comparison(s): No significant change from prior study. FINDINGS  Left Ventricle: Left ventricular ejection fraction, by estimation, is 20 to 25%. The  left ventricle has severely decreased function. There is moderate asymmetric left ventricular hypertrophy of the inferior segment. Left ventricular diastolic parameters  are consistent with Grade I diastolic dysfunction (impaired relaxation). Right Ventricle: The right ventricular size is normal. Right ventricular systolic function is normal. Tricuspid regurgitation signal is inadequate for assessing PA pressure. Left Atrium: Left atrial size was normal in size. Right Atrium: Right atrial size was normal in size. Pericardium: There is no evidence of pericardial effusion. Tricuspid Valve: Tricuspid valve regurgitation is trivial. Aortic Valve: The aortic valve was not well visualized. Aortic valve regurgitation is not visualized. Aortic valve peak gradient measures 11.6 mmHg. Venous: The inferior vena cava is normal in size with greater than 50% respiratory variability, suggesting right atrial pressure of 3 mmHg. Additional Comments: A device lead is visualized.  LEFT VENTRICLE PLAX 2D LVIDd:         4.50 cm LVIDs:         4.00 cm LV PW:         1.40 cm LV IVS:        1.10 cm LVOT diam:     2.00 cm LV SV:         48 LV SV Index:   25 LVOT Area:     3.14 cm  LV Volumes (MOD) LV vol d, MOD A2C: 191.0 ml LV vol d, MOD A4C: 187.0 ml LV vol s, MOD A2C: 135.0 ml LV vol s, MOD A4C: 149.0 ml LV SV MOD A2C:     56.0 ml LV SV MOD A4C:     187.0 ml LV SV MOD BP:      48.2 ml IVC IVC diam: 1.90 cm LEFT ATRIUM         Index LA diam:    3.70 cm 1.94 cm/m  AORTIC VALVE AV Area (Vmax): 1.91 cm AV Vmax:        170.00 cm/s AV Peak Grad:   11.6 mmHg LVOT Vmax:      103.43 cm/s LVOT Vmean:     62.700 cm/s LVOT VTI:       0.153 m  AORTA Ao Root diam: 3.40 cm Ao Asc diam:  3.70 cm MITRAL VALVE MV Area (PHT): 4.53 cm     SHUNTS MV Decel Time: 168 msec     Systemic VTI:  0.15 m MV E velocity: 67.70 cm/s   Systemic Diam: 2.00 cm MV A velocity: 124.00 cm/s MV E/A ratio:  0.55 Photographer signed by Carolan Clines Signature  Date/Time: 01/17/2024/12:25:01 PM    Final    DG Chest Portable 1 View Result Date: 01/16/2024 CLINICAL DATA:  Shortness of breath EXAM: PORTABLE CHEST 1 VIEW COMPARISON:  01/03/2023 FINDINGS: Left ICD remains in place, unchanged. Cardiomegaly. No confluent opacities, effusions or edema. No acute bony abnormality. IMPRESSION: Cardiomegaly.  No active disease. Electronically Signed   By: Charlett Nose M.D.   On: 01/16/2024 21:24   CUP PACEART REMOTE DEVICE CHECK Result Date: 01/08/2024 Scheduled remote reviewed. Normal device function.  1 VHR, 15 beats, 214 bpm Next remote 91 days. ML, CVRS  ECHOCARDIOGRAM COMPLETE Result Date: 01/02/2024    ECHOCARDIOGRAM REPORT   Patient Name:   [name] [facility]   Date of Exam: 01/02/2024 Medical Rec #:  [id number]     Height:       65.0 in Accession #:    [id number]    Weight:       174.0 lb Date of Birth:  June 30, 1961    BSA:          1.864 m Patient Age:    62 years      BP:           136/86 mmHg Patient Gender: F             HR:           74 bpm. Exam Location:  [address] Procedure: 2D Echo, Cardiac Doppler, Color Doppler and Intracardiac            Opacification Agent (Both Spectral and Color Flow Doppler were            utilized during procedure). Indications:    I50.20 Heart failure with reduced Ejection Fraction  History:        Patient has prior history of Echocardiogram examinations, most                 recent 11/14/2022. CHF and Cardiomyopathy, Defibrillator; Risk                 Factors:Hypertension.  Sonographer:    Daphine Deutscher RDCS Referring Phys: [id number] KATLYN D WEST IMPRESSIONS  1. There is no left ventricular thrombus (Definity was used). Left ventricular ejection fraction, by estimation, is 20%. The left ventricle has severely decreased function. The left ventricle demonstrates global hypokinesis. The left ventricular internal cavity size was moderately dilated. Indeterminate diastolic filling due to E-A fusion.  2. Right ventricular systolic  function is normal. The right ventricular size is normal. There is normal pulmonary artery systolic pressure. The estimated right ventricular systolic pressure is 35.3 mmHg.  3. Left atrial size was moderately dilated.  4. The mitral valve is normal in structure. Trivial mitral valve regurgitation.  5. The aortic valve is tricuspid. Aortic valve regurgitation is not visualized. No aortic stenosis is present.  6. There is borderline dilatation of the ascending aorta, measuring 39 mm. Comparison(s): No significant change from prior study. Prior images reviewed side by side. FINDINGS  Left Ventricle: There is no left ventricular thrombus (Definity was used). Left ventricular ejection fraction, by estimation, is 20%. The left  ventricle has severely decreased function. The left ventricle demonstrates global hypokinesis. Definity contrast agent was given IV to delineate the left ventricular endocardial borders. Strain imaging was not performed. The left ventricular internal cavity size was moderately dilated. There is no left ventricular hypertrophy. Abnormal (paradoxical) septal  motion, consistent with left bundle branch block. Indeterminate diastolic filling due to E-A fusion. Right Ventricle: The right ventricular size is normal. No increase in right ventricular wall thickness. Right ventricular systolic function is normal. There is normal pulmonary artery systolic pressure. The tricuspid regurgitant velocity is 2.84 m/s, and  with an assumed right atrial pressure of 3 mmHg, the estimated right ventricular systolic pressure is 35.3 mmHg. Left Atrium: Left atrial size was moderately dilated. Right Atrium: Right atrial size was normal in size. Pericardium: There is no evidence of pericardial effusion. Mitral Valve: The mitral valve is normal in structure. Trivial mitral valve regurgitation. Tricuspid Valve: The tricuspid valve is normal in structure. Tricuspid valve regurgitation is trivial. Aortic Valve: The aortic  valve is tricuspid. Aortic valve regurgitation is not visualized. No aortic stenosis is present. Pulmonic Valve: The pulmonic valve was normal in structure. Pulmonic valve regurgitation is not visualized. No evidence of pulmonic stenosis. Aorta: The aortic root is normal in size and structure. There is borderline dilatation of the ascending aorta, measuring 39 mm. IAS/Shunts: No atrial level shunt detected by color flow Doppler. Additional Comments: 3D imaging was not performed. A device lead is visualized.  LEFT VENTRICLE PLAX 2D LVIDd:         6.00 cm LVIDs:         5.40 cm LV PW:         1.16 cm LV IVS:        1.00 cm LVOT diam:     2.00 cm LV SV:         42 LV SV Index:   22 LVOT Area:     3.14 cm  RIGHT VENTRICLE             IVC RV Basal diam:  4.30 cm     IVC diam: 1.30 cm RV S prime:     15.23 cm/s TAPSE (M-mode): 2.8 cm LEFT ATRIUM             Index        RIGHT ATRIUM           Index LA diam:        4.70 cm 2.52 cm/m   RA Area:     14.60 cm LA Vol (A2C):   58.0 ml 31.11 ml/m  RA Volume:   38.80 ml  20.81 ml/m LA Vol (A4C):   57.3 ml 30.73 ml/m LA Biplane Vol: 58.5 ml 31.38 ml/m  AORTIC VALVE LVOT Vmax:   73.67 cm/s LVOT Vmean:  47.100 cm/s LVOT VTI:    0.133 m  AORTA Ao Root diam: 3.60 cm Ao Asc diam:  3.90 cm MV E velocity: 129.33 cm/s  TRICUSPID VALVE                             TR Peak grad:   32.3 mmHg                             TR Vmax:        284.00 cm/s  SHUNTS                             Systemic VTI:  0.13 m                             Systemic Diam: 2.00 cm Mihai Croitoru MD Electronically signed by Thurmon Fair MD Signature Date/Time: 01/02/2024/3:54:48 PM    Final     Microbiology: Recent Results (from the past 240 hours)  Resp panel by RT-PCR (RSV, Flu A&B, Covid) Anterior Nasal Swab     Status: None   Collection Time: 01/16/24  8:08 PM   Specimen: Anterior Nasal Swab  Result Value Ref Range Status   SARS Coronavirus 2 by RT PCR NEGATIVE NEGATIVE  Final   Influenza A by PCR NEGATIVE NEGATIVE Final   Influenza B by PCR NEGATIVE NEGATIVE Final    Comment: (NOTE) The Xpert Xpress SARS-CoV-2/FLU/RSV plus assay is intended as an aid in the diagnosis of influenza from Nasopharyngeal swab specimens and should not be used as a sole basis for treatment. Nasal washings and aspirates are unacceptable for Xpert Xpress SARS-CoV-2/FLU/RSV testing.  Fact Sheet for Patients: BloggerCourse.com  Fact Sheet for Healthcare Providers: SeriousBroker.it  This test is not yet approved or cleared by the [place] FDA and has been authorized for detection and/or diagnosis of SARS-CoV-2 by FDA under an Emergency Use Authorization (EUA). This EUA will remain in effect (meaning this test can be used) for the duration of the COVID-19 declaration under Section 564(b)(1) of the Act, 21 U.S.C. section 360bbb-3(b)(1), unless the authorization is terminated or revoked.     Resp Syncytial Virus by PCR NEGATIVE NEGATIVE Final    Comment: (NOTE) Fact Sheet for Patients: BloggerCourse.com  Fact Sheet for Healthcare Providers: SeriousBroker.it  This test is not yet approved or cleared by the [place] FDA and has been authorized for detection and/or diagnosis of SARS-CoV-2 by FDA under an Emergency Use Authorization (EUA). This EUA will remain in effect (meaning this test can be used) for the duration of the COVID-19 declaration under Section 564(b)(1) of the Act, 21 U.S.C. section 360bbb-3(b)(1), unless the authorization is terminated or revoked.  Performed at [facility], [address], [place], [place] [address]   Respiratory (~20 pathogens) panel by PCR     Status: None   Collection Time: 01/16/24 11:41 PM   Specimen: Nasopharyngeal Swab; Respiratory  Result Value Ref Range Status   Adenovirus NOT DETECTED NOT DETECTED Final    Coronavirus 229E NOT DETECTED NOT DETECTED Final    Comment: (NOTE) The Coronavirus on the Respiratory Panel, DOES NOT test for the novel  Coronavirus (2019 nCoV)    Coronavirus HKU1 NOT DETECTED NOT DETECTED Final   Coronavirus NL63 NOT DETECTED NOT DETECTED Final   Coronavirus OC43 NOT DETECTED NOT DETECTED Final   Metapneumovirus NOT DETECTED NOT DETECTED Final   Rhinovirus / Enterovirus NOT DETECTED NOT DETECTED Final   Influenza A NOT DETECTED NOT DETECTED Final   Influenza B NOT DETECTED NOT DETECTED Final   Parainfluenza Virus 1 NOT DETECTED NOT DETECTED Final   Parainfluenza Virus 2 NOT DETECTED NOT DETECTED Final   Parainfluenza Virus 3 NOT DETECTED NOT DETECTED Final   Parainfluenza Virus 4 NOT DETECTED NOT DETECTED Final   Respiratory Syncytial Virus NOT DETECTED NOT DETECTED Final   Bordetella pertussis NOT DETECTED NOT DETECTED Final   Bordetella  Parapertussis NOT DETECTED NOT DETECTED Final   Chlamydophila pneumoniae NOT DETECTED NOT DETECTED Final   Mycoplasma pneumoniae NOT DETECTED NOT DETECTED Final    Comment: Performed at [facility], [address], [place], [place] [address]     Labs: Basic Metabolic Panel: Recent Labs  Lab 01/16/24 2008 01/16/24 2020 01/17/24 0411 01/18/24 0223 01/19/24 0254 01/20/24 0300  NA 139 140 140 142 137 139  K 4.6 4.6 4.9 4.8 4.5 4.8  CL 110  --  111 109 106 105  CO2 17*  --  20* 23 21* 22  GLUCOSE 171*  --  115* 99 134* 119*  BUN 41*  --  40* 47* 56* 65*  CREATININE 2.60*  --  2.43* 2.88* 2.91* 2.61*  CALCIUM 8.8*  --  8.7* 8.9 8.9 8.9   Liver Function Tests: Recent Labs  Lab 01/16/24 2008 01/17/24 0411 01/18/24 0223  AST 28 19 17   ALT 15 13 13   ALKPHOS 76 70 65  BILITOT 0.6 0.7 0.7  PROT 7.3 6.9 6.9  ALBUMIN 3.3* 3.1* 3.0*   No results for input(s): "LIPASE", "AMYLASE" in the last 168 hours. No results for input(s): "AMMONIA" in the last 168 hours. CBC: Recent Labs  Lab 01/16/24 2008 01/16/24 2020  01/17/24 0411 01/18/24 0223 01/19/24 0254  WBC 10.5  --  8.6 7.4 7.6  NEUTROABS 8.0*  --   --   --   --   HGB 11.2* 11.9* 10.3* 10.4* 11.0*  HCT 34.6* 35.0* 32.1* 32.2* 33.5*  MCV 97.7  --  96.7 97.0 94.4  PLT 223  --  220 221 244   Cardiac Enzymes: No results for input(s): "CKTOTAL", "CKMB", "CKMBINDEX", "TROPONINI" in the last 168 hours. BNP: BNP (last 3 results) Recent Labs    01/16/24 2008 01/18/24 0223  BNP 526.2* 236.5*    ProBNP (last 3 results) No results for input(s): "PROBNP" in the last 8760 hours.  CBG: No results for input(s): "GLUCAP" in the last 168 hours.     Signed:  Zannie Cove MD.  [facility] 01/20/2024, 11:02 AM

## 2024-01-20 NOTE — TOC Transition Note (Addendum)
 Transition of Care [facility]) - Discharge Note   Patient Details  Name: [name] MRN: [id number] Date of Birth: 11-06-1960  [facility] CM/SW Contact:  Leone Haven, RN Phone Number: 01/20/2024, 10:06 AM   Clinical Narrative:    For dc today , she has transportation at dc.  TOC pharmacy to fill medications.     Final next level of care: Home/Self Care Barriers to Discharge: Continued Medical Work up   Patient Goals and CMS Choice Patient states their goals for this hospitalization and ongoing recovery are:: return home   Choice offered to / list presented to : NA      Discharge Placement                       Discharge Plan and Services Additional resources added to the After Visit Summary for   In-house Referral: NA Discharge Planning Services: CM Consult Post Acute Care Choice: NA            DME Agency: NA       HH Arranged: NA          Social Drivers of Health (SDOH) Interventions SDOH Screenings   Food Insecurity: No Food Insecurity (01/17/2024)  Housing: Low Risk  (01/19/2024)  Transportation Needs: No Transportation Needs (01/19/2024)  Utilities: Not At Risk (01/17/2024)  Alcohol Screen: Low Risk  (01/19/2024)  Financial Resource Strain: Low Risk  (01/19/2024)  Tobacco Use: Low Risk  (01/16/2024)     Readmission Risk Interventions    01/19/2024    4:08 PM  Readmission Risk Prevention Plan  Medication Screening Complete  Transportation Screening Complete

## 2024-01-20 NOTE — Progress Notes (Signed)
 Explained discharge instructions to patient. Reviewed follow up appointment and next medication administration times. Also reviewed education. Patient verbalized having an understanding for instructions given. All belongings are in the patient's possession.  TOC meds will be delivered to the dc lounge when ready.  IV and telemetry were removed. CCMD was notified. No other needs verbalized. Transported downstairs to discharge lounge to await her ride.

## 2024-01-20 NOTE — Telephone Encounter (Signed)
 Patient received results while in the hospital

## 2024-01-23 ENCOUNTER — Telehealth (HOSPITAL_COMMUNITY): Payer: Self-pay

## 2024-01-23 DIAGNOSIS — I130 Hypertensive heart and chronic kidney disease with heart failure and stage 1 through stage 4 chronic kidney disease, or unspecified chronic kidney disease: Secondary | ICD-10-CM | POA: Diagnosis not present

## 2024-01-23 NOTE — Telephone Encounter (Signed)
 Called to confirm/remind patient of their appointment at [facility] on 01/26/2024 2:45.   Appointment:   [] Confirmed  [] Left mess   [x] No answer/No voice mail  [] Phone not in service  Patient reminded to bring all medications and/or complete list.  Confirmed patient has transportation. Gave directions, instructed to utilize valet parking.

## 2024-01-25 NOTE — Progress Notes (Incomplete)
 HEART & VASCULAR TRANSITION OF CARE CONSULT NOTE     Referring Physician: Dr. Jomarie Longs PCP: Alysia Penna, MD  Nephrology: [facility] Primary Cardiologist: Dr. Antoine Poche EP: Dr Ladona Ridgel  HPI: Referred to clinic by Dr. Jomarie Longs for heart failure consultation.   [name] is a 63 y.o. female with HTN, CVA, CKD 3, PAF and chronic systolic heart failure.  CM dates back to 2006 with EF 30%. She had a cardiac catheterization 03/2005 showing normal coronaries.  Management of her hypertensive recommended.    Echo (2015) showed EF 25-30%. EF recovered to 50-55% in 2018. Admitted 2/23 for acute left CVA infarct. Echo showed EF down 20-25%. cMRI did not demonstrate a clot source, there is no evidence of previous MI or amyloid. She was negative for sleep apnea. She was noted to be COVID-positive. Echo 1/24 showed  EF of 20-25%.   Underwent BiV ICD 01/2023.   Admitted 3/25 with a/c HF. Diuresed with IV lasix. GDMT titrated and she was discharged home, weight 170 lbs.  Today she presents to [facility] for post hospital HF follow up. Overall feeling fine. 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 Does OK with ADLs and walking on flat ground. Denies  palpitations, abnormal bleeding, CP, dizziness, edema, or PND/Orthopnea. Appetite ok. No fever or chills. Weight at home 168 pounds. Taking all medications. Drinks 1.5-2 beers/day, no tobacco, no drugs.  Family Hx: Mother, father, brother and sister all passed by the age of 41 from CAD. Social Hx: works as a Scientist, clinical (histocompatibility and immunogenetics) at [employer] care unit, lives with nephew  Cardiac Testing  - Ltd Echo 3/25: EF 20-25%, RV ok - Echo 2/25: EF 20%, RV ok - Echo 1/24: EF 20-25% - cMRI 2/23: no clot, no LGE or amyloid - Echo 2/23: EF 20-25% - Echo 2018: EF 50-55% - Echo 2015: EF 25-30% - Cath 03/2005: normal cors - Echo 2006: EF 30%  Past Medical History:  Diagnosis Date   CHF (congestive heart failure) (HCC)     Hypertension    Overweight(278.02)    Sciatica    Secondary cardiomyopathy, unspecified    EF now 50%   Current Outpatient Medications  Medication Sig Dispense Refill   Acetaminophen (TYLENOL PO) Take 1,000 mg by mouth daily as needed (for pain).     albuterol (VENTOLIN HFA) 108 (90 Base) MCG/ACT inhaler Inhale 1 puff into the lungs every 6 (six) hours as needed.     apixaban (ELIQUIS) 5 MG TABS tablet TAKE 1 TABLET(5 MG) BY MOUTH TWICE DAILY 60 tablet 1   atorvastatin (LIPITOR) 80 MG tablet Take 1 tablet (80 mg total) by mouth daily. 30 tablet 1   carvedilol (COREG) 25 MG tablet Take 1 tablet (25 mg total) by mouth 2 (two) times daily with a meal. 180 tablet 3   furosemide (LASIX) 20 MG tablet Take 1 tablet (20 mg total) by mouth daily. 30 tablet 1   hydrALAZINE (APRESOLINE) 25 MG tablet TAKE 1 TABLET(25 MG) BY MOUTH THREE TIMES DAILY 270 tablet 2   HYDROcodone-acetaminophen (NORCO/VICODIN) 5-325 MG tablet Take 1 tablet by mouth daily as needed for severe pain (pain score 7-10).     isosorbide mononitrate (IMDUR) 30 MG 24 hr tablet Take 1 tablet (30 mg total) by mouth daily. 30 tablet 1   zolpidem (AMBIEN) 5 MG tablet Take 5 mg by mouth at bedtime as needed for sleep.  1   No current facility-administered medications for this encounter.   Allergies  Allergen Reactions   Latex Itching   Chlorhexidine Gluconate Hives    Unknown if this is the cause of the hives, raised areas around lab draw sites. No tape and area wiped with chlorhexidine.   Social History   Socioeconomic History   Marital status: Single    Spouse name: Not on file   Number of children: 3   Years of education: Not on file   Highest education level: High school graduate  Occupational History   Not on file  Tobacco Use   Smoking status: Never   Smokeless tobacco: Never   Tobacco comments:    Never smoke 03/07/23  Vaping Use   Vaping status: Never Used  Substance and Sexual Activity   Alcohol use: Yes     Alcohol/week: 7.0 - 14.0 standard drinks of alcohol    Types: 7 - 14 Cans of beer per week    Comment: 1-2 beers daily 03/07/23   Drug use: No   Sexual activity: Yes    Birth control/protection: Surgical  Other Topics Concern   Not on file  Social History Narrative   Not on file   Social Drivers of Health   Financial Resource Strain: Low Risk  (01/19/2024)   Overall Financial Resource Strain (CARDIA)    Difficulty of Paying Living Expenses: Not hard at all  Food Insecurity: No Food Insecurity (01/17/2024)   Hunger Vital Sign    Worried About Running Out of Food in the Last Year: Never true    Ran Out of Food in the Last Year: Never true  Transportation Needs: No Transportation Needs (01/19/2024)   PRAPARE - Administrator, Civil Service (Medical): No    Lack of Transportation (Non-Medical): No  Physical Activity: Not on file  Stress: Not on file  Social Connections: Not on file  Intimate Partner Violence: Not At Risk (01/17/2024)   Humiliation, Afraid, Rape, and Kick questionnaire    Fear of Current or Ex-Partner: No    Emotionally Abused: No    Physically Abused: No    Sexually Abused: No   Family History  Problem Relation Age of Onset   Cerebral aneurysm Mother 9       ruptured   Hypertension Mother        severe   Heart attack Father 43   Coronary artery disease Other    Hypertension Brother        severe   Colon cancer Neg Hx    Breast cancer Neg Hx    Wt Readings from Last 3 Encounters:  01/26/24 78.2 kg (172 lb 6.4 oz)  01/20/24 77.1 kg (170 lb)  12/17/23 78.9 kg (174 lb)   BP (!) 160/74   Pulse 95   Ht 5\' 7"  (1.702 m)   Wt 78.2 kg (172 lb 6.4 oz)   SpO2 98%   BMI 27.00 kg/m   PHYSICAL EXAM: General:  NAD. No resp difficulty, walked into clinic HEENT: Normal Neck: Supple. No JVD. Cor: Regular rate & rhythm. No rubs, gallops or murmurs. Lungs: Clear Abdomen: Soft, nontender, nondistended.  Extremities: No cyanosis, clubbing, rash,  edema Neuro: Alert & oriented x 3, moves all 4 extremities w/o difficulty. Affect pleasant.  ECG (personally reviewed): A sensed, V paced 93 bpm  Device interrogation (personally reviewed): OptiVol creeping up, no AF or VT, 2.1 hr/day activity, 95.6% BiV pacing  ASSESSMENT & PLAN: Chronic Systolic Heart Failure -  cMIR 2/23: LVEF 20%, RVEF 44%, prominent septal-lateral dyssynchrony, no evidence for large prior MI, myocarditis, or infiltrative disease like amyloidosis. - s/p MDT BiV iCD - NYHA II, volume up a bit - GDMT limited by CKD  - Start Jardiance 10 mg daily (last GFR 20, previously on Farxiga but stopped w/ worsening renal function). We discussed potential side effects - Continue Lasix 20 mg daily - Continue Coreg 25 mg bid - Increase hydralazine to 37.5 mg tid + Imdur 30 mg daily - Labs today - Not a candidate for advanced therapies with advanced CKD  AF - NSR on ECG today - No AF on device interrogation today. - Continue Eliquis 5 mg bid - CBC today  CKD3/4 - Has been referred to [facility] - Baseline SCr 2.4-2.6 - Avoid hypotension - Adding SGLT2i, expect transient drop in renal function but CV benefits > risks  HTN - BP up today - Increase hydralazine as above.  NYHA I-II GDMT  Diuretic: Lasix 20 mg daily. BB: Coreg 25 mg bid Ace/ARB/ARNI: no, CKD MRA: no, CKD SGLT2i: start Jardiance 10 mg daily.  Referred to HFSW (PCP, Medications, Transportation, ETOH Abuse, Drug Abuse, Insurance, Financial ): No Refer to Pharmacy: No Refer to Home Health: No Refer to Advanced Heart Failure Clinic: No Refer to General Cardiology: No, established with Dr. Antoine Poche  Follow up PRN. Continue follow up with Cardiology, as scheduled.   Prince Rome, FNP-BC 01/26/24

## 2024-01-26 ENCOUNTER — Ambulatory Visit (HOSPITAL_COMMUNITY)
Admit: 2024-01-26 | Discharge: 2024-01-26 | Disposition: A | Discharge status: Home / Self Care | Attending: Family Medicine | Admitting: Family Medicine

## 2024-01-26 ENCOUNTER — Other Ambulatory Visit (HOSPITAL_COMMUNITY): Payer: Self-pay

## 2024-01-26 ENCOUNTER — Encounter (HOSPITAL_COMMUNITY): Payer: Self-pay

## 2024-01-26 VITALS — BP 160/74 | HR 95 | Ht 67.0 in | Wt 172.4 lb

## 2024-01-26 DIAGNOSIS — I10 Essential (primary) hypertension: Secondary | ICD-10-CM | POA: Diagnosis not present

## 2024-01-26 DIAGNOSIS — Z8249 Family history of ischemic heart disease and other diseases of the circulatory system: Secondary | ICD-10-CM | POA: Insufficient documentation

## 2024-01-26 DIAGNOSIS — I130 Hypertensive heart and chronic kidney disease with heart failure and stage 1 through stage 4 chronic kidney disease, or unspecified chronic kidney disease: Secondary | ICD-10-CM | POA: Insufficient documentation

## 2024-01-26 DIAGNOSIS — Z8616 Personal history of COVID-19: Secondary | ICD-10-CM | POA: Diagnosis not present

## 2024-01-26 DIAGNOSIS — N183 Chronic kidney disease, stage 3 unspecified: Secondary | ICD-10-CM | POA: Diagnosis not present

## 2024-01-26 DIAGNOSIS — I5020 Unspecified systolic (congestive) heart failure: Secondary | ICD-10-CM | POA: Diagnosis not present

## 2024-01-26 DIAGNOSIS — Z79899 Other long term (current) drug therapy: Secondary | ICD-10-CM | POA: Insufficient documentation

## 2024-01-26 DIAGNOSIS — Z7901 Long term (current) use of anticoagulants: Secondary | ICD-10-CM | POA: Diagnosis not present

## 2024-01-26 DIAGNOSIS — N1832 Chronic kidney disease, stage 3b: Secondary | ICD-10-CM | POA: Diagnosis not present

## 2024-01-26 DIAGNOSIS — R0602 Shortness of breath: Secondary | ICD-10-CM | POA: Diagnosis not present

## 2024-01-26 DIAGNOSIS — I480 Paroxysmal atrial fibrillation: Secondary | ICD-10-CM | POA: Diagnosis not present

## 2024-01-26 DIAGNOSIS — I5022 Chronic systolic (congestive) heart failure: Secondary | ICD-10-CM | POA: Insufficient documentation

## 2024-01-26 LAB — BASIC METABOLIC PANEL
Anion gap: 10 (ref 5–15)
BUN: 54 mg/dL — ABNORMAL HIGH (ref 6–20)
CO2: 20 mmol/L — ABNORMAL LOW (ref 22–32)
Calcium: 9.1 mg/dL (ref 8.9–10.3)
Chloride: 111 mmol/L (ref 98–111)
Creatinine, Ser: 2.5 mg/dL — ABNORMAL HIGH (ref 0.44–1.00)
GFR, Estimated: 21 mL/min — ABNORMAL LOW (ref >60)
Glucose, Bld: 101 mg/dL — ABNORMAL HIGH (ref 70–99)
Potassium: 4.1 mmol/L (ref 3.5–5.1)
Sodium: 141 mmol/L (ref 135–145)

## 2024-01-26 LAB — BRAIN NATRIURETIC PEPTIDE: B Natriuretic Peptide: 270.7 pg/mL — ABNORMAL HIGH (ref 0.0–100.0)

## 2024-01-26 MED ORDER — EMPAGLIFLOZIN 10 MG PO TABS: 10.0000 mg | ORAL_TABLET | Freq: Every day | ORAL | 5 refills | Status: DC

## 2024-01-26 MED ORDER — HYDRALAZINE HCL 25 MG PO TABS: 37.5000 mg | ORAL_TABLET | Freq: Three times a day (TID) | ORAL | 5 refills | Status: DC

## 2024-01-26 NOTE — Patient Instructions (Signed)
 Medication Changes:  START: JARDIANCE 10MG  ONCE DAILY   INCREASE HYDRALAZINE TO 37.5MG  THREE TIMES DAY   Lab Work:  Labs done today, your results will be available in MyChart, we will contact you for abnormal readings.  Special Instructions // Education:  PLEASE CALL [facility] TO  MAKE FOLLOW UP APPOINTMENT   Follow-Up in: AS NEEDED   At the [facility], you and your health needs are our priority. We have a designated team specialized in the treatment of Heart Failure. This Care Team includes your primary Heart Failure Specialized Cardiologist (physician), Advanced Practice Providers (APPs- Physician Assistants and Nurse Practitioners), and Pharmacist who all work together to provide you with the care you need, when you need it.   You may see any of the following providers on your designated Care Team at your next follow up:  Dr. Arvilla Meres Dr. Marca Ancona Dr. Dorthula Nettles Dr. Theresia Bough Tonye Becket, NP Robbie Lis, [place] [facility] [place], [place] Brynda Peon, NP [place] Lee, NP Karle Plumber, PharmD   Please be sure to bring in all your medications bottles to every appointment.   Need to Contact us:  If you have any questions or concerns before your next appointment please send [place] a message through [place] or call our office at [phone].    TO LEAVE A MESSAGE FOR THE NURSE SELECT OPTION 2, PLEASE LEAVE A MESSAGE INCLUDING: YOUR NAME DATE OF BIRTH CALL BACK NUMBER REASON FOR CALL**this is important as we prioritize the call backs  YOU WILL RECEIVE A CALL BACK THE SAME DAY AS LONG AS YOU CALL BEFORE 4:00 PM

## 2024-01-27 ENCOUNTER — Telehealth (HOSPITAL_COMMUNITY): Payer: Self-pay

## 2024-01-27 ENCOUNTER — Other Ambulatory Visit (HOSPITAL_COMMUNITY): Payer: Self-pay

## 2024-01-27 NOTE — Telephone Encounter (Signed)
 Advanced Heart Failure Patient Advocate Encounter  Prior authorization for [place] has been submitted and approved. Test billing returns $29.99 for 90 day supply after eVoucher is applied. Insurance copay would be $90 for 90, and this patient is eligible to use copay savings card.  Key: [id number] Effective: 12/28/2023 to 01/26/2025  Burnell Blanks, CPhT Rx Patient Advocate Phone: [phone]

## 2024-01-31 ENCOUNTER — Other Ambulatory Visit: Payer: Self-pay

## 2024-01-31 ENCOUNTER — Encounter (HOSPITAL_COMMUNITY): Payer: Self-pay | Admitting: Internal Medicine

## 2024-01-31 ENCOUNTER — Inpatient Hospital Stay (HOSPITAL_COMMUNITY)
Admission: EM | Admit: 2024-01-31 | Discharge: 2024-02-06 | DRG: 291 | Disposition: A | Discharge status: Home / Self Care | Attending: Internal Medicine | Admitting: Internal Medicine

## 2024-01-31 ENCOUNTER — Emergency Department (HOSPITAL_COMMUNITY)

## 2024-01-31 DIAGNOSIS — R062 Wheezing: Secondary | ICD-10-CM

## 2024-01-31 DIAGNOSIS — Z8249 Family history of ischemic heart disease and other diseases of the circulatory system: Secondary | ICD-10-CM

## 2024-01-31 DIAGNOSIS — I5023 Acute on chronic systolic (congestive) heart failure: Secondary | ICD-10-CM | POA: Diagnosis not present

## 2024-01-31 DIAGNOSIS — J668 Airway disease due to other specific organic dusts: Secondary | ICD-10-CM | POA: Diagnosis not present

## 2024-01-31 DIAGNOSIS — I4729 Other ventricular tachycardia: Secondary | ICD-10-CM

## 2024-01-31 DIAGNOSIS — E871 Hypo-osmolality and hyponatremia: Secondary | ICD-10-CM | POA: Diagnosis present

## 2024-01-31 DIAGNOSIS — Z8673 Personal history of transient ischemic attack (TIA), and cerebral infarction without residual deficits: Secondary | ICD-10-CM

## 2024-01-31 DIAGNOSIS — Z95810 Presence of automatic (implantable) cardiac defibrillator: Secondary | ICD-10-CM

## 2024-01-31 DIAGNOSIS — E785 Hyperlipidemia, unspecified: Secondary | ICD-10-CM | POA: Diagnosis present

## 2024-01-31 DIAGNOSIS — Z1152 Encounter for screening for COVID-19: Secondary | ICD-10-CM

## 2024-01-31 DIAGNOSIS — I472 Ventricular tachycardia, unspecified: Secondary | ICD-10-CM | POA: Diagnosis not present

## 2024-01-31 DIAGNOSIS — N170 Acute kidney failure with tubular necrosis: Secondary | ICD-10-CM | POA: Diagnosis not present

## 2024-01-31 DIAGNOSIS — E663 Overweight: Secondary | ICD-10-CM | POA: Diagnosis not present

## 2024-01-31 DIAGNOSIS — I4719 Other supraventricular tachycardia: Secondary | ICD-10-CM | POA: Diagnosis present

## 2024-01-31 DIAGNOSIS — I130 Hypertensive heart and chronic kidney disease with heart failure and stage 1 through stage 4 chronic kidney disease, or unspecified chronic kidney disease: Secondary | ICD-10-CM | POA: Diagnosis not present

## 2024-01-31 DIAGNOSIS — Z91040 Latex allergy status: Secondary | ICD-10-CM | POA: Diagnosis not present

## 2024-01-31 DIAGNOSIS — Z79899 Other long term (current) drug therapy: Secondary | ICD-10-CM | POA: Diagnosis not present

## 2024-01-31 DIAGNOSIS — J45909 Unspecified asthma, uncomplicated: Secondary | ICD-10-CM | POA: Diagnosis not present

## 2024-01-31 DIAGNOSIS — T501X5A Adverse effect of loop [high-ceiling] diuretics, initial encounter: Secondary | ICD-10-CM | POA: Diagnosis present

## 2024-01-31 DIAGNOSIS — E876 Hypokalemia: Secondary | ICD-10-CM | POA: Diagnosis present

## 2024-01-31 DIAGNOSIS — Z6826 Body mass index (BMI) 26.0-26.9, adult: Secondary | ICD-10-CM | POA: Diagnosis not present

## 2024-01-31 DIAGNOSIS — I213 ST elevation (STEMI) myocardial infarction of unspecified site: Secondary | ICD-10-CM | POA: Diagnosis not present

## 2024-01-31 DIAGNOSIS — O479 False labor, unspecified: Secondary | ICD-10-CM | POA: Diagnosis not present

## 2024-01-31 DIAGNOSIS — Z888 Allergy status to other drugs, medicaments and biological substances status: Secondary | ICD-10-CM

## 2024-01-31 DIAGNOSIS — I480 Paroxysmal atrial fibrillation: Secondary | ICD-10-CM | POA: Diagnosis not present

## 2024-01-31 DIAGNOSIS — Z823 Family history of stroke: Secondary | ICD-10-CM

## 2024-01-31 DIAGNOSIS — I517 Cardiomegaly: Secondary | ICD-10-CM | POA: Diagnosis not present

## 2024-01-31 DIAGNOSIS — R0902 Hypoxemia: Secondary | ICD-10-CM | POA: Diagnosis not present

## 2024-01-31 DIAGNOSIS — I509 Heart failure, unspecified: Secondary | ICD-10-CM | POA: Insufficient documentation

## 2024-01-31 DIAGNOSIS — Z7901 Long term (current) use of anticoagulants: Secondary | ICD-10-CM

## 2024-01-31 DIAGNOSIS — D631 Anemia in chronic kidney disease: Secondary | ICD-10-CM | POA: Diagnosis present

## 2024-01-31 DIAGNOSIS — I447 Left bundle-branch block, unspecified: Secondary | ICD-10-CM | POA: Diagnosis not present

## 2024-01-31 DIAGNOSIS — Z91119 Patient's noncompliance with dietary regimen due to unspecified reason: Secondary | ICD-10-CM

## 2024-01-31 DIAGNOSIS — I10 Essential (primary) hypertension: Secondary | ICD-10-CM | POA: Diagnosis present

## 2024-01-31 DIAGNOSIS — I420 Dilated cardiomyopathy: Secondary | ICD-10-CM | POA: Diagnosis not present

## 2024-01-31 DIAGNOSIS — R0602 Shortness of breath: Secondary | ICD-10-CM | POA: Diagnosis not present

## 2024-01-31 DIAGNOSIS — I493 Ventricular premature depolarization: Secondary | ICD-10-CM | POA: Diagnosis present

## 2024-01-31 DIAGNOSIS — N184 Chronic kidney disease, stage 4 (severe): Secondary | ICD-10-CM | POA: Diagnosis not present

## 2024-01-31 DIAGNOSIS — I700 Atherosclerosis of aorta: Secondary | ICD-10-CM | POA: Diagnosis not present

## 2024-01-31 LAB — CBC WITH DIFFERENTIAL/PLATELET
Abs Immature Granulocytes: 0.03 10*3/uL (ref 0.00–0.07)
Basophils Absolute: 0.1 10*3/uL (ref 0.0–0.1)
Basophils Relative: 1 %
Eosinophils Absolute: 1 10*3/uL — ABNORMAL HIGH (ref 0.0–0.5)
Eosinophils Relative: 12 %
HCT: 34.5 % — ABNORMAL LOW (ref 36.0–46.0)
Hemoglobin: 11.2 g/dL — ABNORMAL LOW (ref 12.0–15.0)
Immature Granulocytes: 0 %
Lymphocytes Relative: 12 %
Lymphs Abs: 1 10*3/uL (ref 0.7–4.0)
MCH: 31.1 pg (ref 26.0–34.0)
MCHC: 32.5 g/dL (ref 30.0–36.0)
MCV: 95.8 fL (ref 80.0–100.0)
Monocytes Absolute: 0.6 10*3/uL (ref 0.1–1.0)
Monocytes Relative: 7 %
Neutro Abs: 5.5 10*3/uL (ref 1.7–7.7)
Neutrophils Relative %: 68 %
Platelets: 225 10*3/uL (ref 150–400)
RBC: 3.6 MIL/uL — ABNORMAL LOW (ref 3.87–5.11)
RDW: 13.8 % (ref 11.5–15.5)
WBC: 8.2 10*3/uL (ref 4.0–10.5)
nRBC: 0 % (ref 0.0–0.2)

## 2024-01-31 LAB — BASIC METABOLIC PANEL WITH GFR
Anion gap: 11 (ref 5–15)
BUN: 37 mg/dL — ABNORMAL HIGH (ref 6–20)
CO2: 20 mmol/L — ABNORMAL LOW (ref 22–32)
Calcium: 8.7 mg/dL — ABNORMAL LOW (ref 8.9–10.3)
Chloride: 109 mmol/L (ref 98–111)
Creatinine, Ser: 2.08 mg/dL — ABNORMAL HIGH (ref 0.44–1.00)
GFR, Estimated: 27 mL/min — ABNORMAL LOW (ref >60)
Glucose, Bld: 108 mg/dL — ABNORMAL HIGH (ref 70–99)
Potassium: 3.6 mmol/L (ref 3.5–5.1)
Sodium: 140 mmol/L (ref 135–145)

## 2024-01-31 LAB — COMPREHENSIVE METABOLIC PANEL WITH GFR
ALT: 13 U/L (ref 0–44)
AST: 18 U/L (ref 15–41)
Albumin: 3 g/dL — ABNORMAL LOW (ref 3.5–5.0)
Alkaline Phosphatase: 73 U/L (ref 38–126)
Anion gap: 10 (ref 5–15)
BUN: 40 mg/dL — ABNORMAL HIGH (ref 6–20)
CO2: 21 mmol/L — ABNORMAL LOW (ref 22–32)
Calcium: 8.6 mg/dL — ABNORMAL LOW (ref 8.9–10.3)
Chloride: 109 mmol/L (ref 98–111)
Creatinine, Ser: 2.27 mg/dL — ABNORMAL HIGH (ref 0.44–1.00)
GFR, Estimated: 24 mL/min — ABNORMAL LOW (ref >60)
Glucose, Bld: 113 mg/dL — ABNORMAL HIGH (ref 70–99)
Potassium: 3.5 mmol/L (ref 3.5–5.1)
Sodium: 140 mmol/L (ref 135–145)
Total Bilirubin: 0.6 mg/dL (ref 0.0–1.2)
Total Protein: 6.8 g/dL (ref 6.5–8.1)

## 2024-01-31 LAB — BRAIN NATRIURETIC PEPTIDE: B Natriuretic Peptide: 459.6 pg/mL — ABNORMAL HIGH (ref 0.0–100.0)

## 2024-01-31 LAB — RESP PANEL BY RT-PCR (RSV, FLU A&B, COVID)  RVPGX2
Influenza A by PCR: NEGATIVE
Influenza B by PCR: NEGATIVE
Resp Syncytial Virus by PCR: NEGATIVE
SARS Coronavirus 2 by RT PCR: NEGATIVE

## 2024-01-31 LAB — CREATININE, SERUM
Creatinine, Ser: 2.18 mg/dL — ABNORMAL HIGH (ref 0.44–1.00)
GFR, Estimated: 25 mL/min — ABNORMAL LOW (ref >60)

## 2024-01-31 LAB — CBC
HCT: 34 % — ABNORMAL LOW (ref 36.0–46.0)
Hemoglobin: 11.4 g/dL — ABNORMAL LOW (ref 12.0–15.0)
MCH: 31.5 pg (ref 26.0–34.0)
MCHC: 33.5 g/dL (ref 30.0–36.0)
MCV: 93.9 fL (ref 80.0–100.0)
Platelets: 234 10*3/uL (ref 150–400)
RBC: 3.62 MIL/uL — ABNORMAL LOW (ref 3.87–5.11)
RDW: 13.8 % (ref 11.5–15.5)
WBC: 8.8 10*3/uL (ref 4.0–10.5)
nRBC: 0 % (ref 0.0–0.2)

## 2024-01-31 LAB — MAGNESIUM: Magnesium: 1.7 mg/dL (ref 1.7–2.4)

## 2024-01-31 LAB — HIV ANTIBODY (ROUTINE TESTING W REFLEX): HIV Screen 4th Generation wRfx: NONREACTIVE

## 2024-01-31 MED ORDER — HYDROCODONE-ACETAMINOPHEN 5-325 MG PO TABS
1.0000 | ORAL_TABLET | Freq: Every day | ORAL | Status: DC | PRN
  Administered 2024-01-31 – 2024-02-05 (×7): 1 via ORAL
  Filled 2024-01-31 (×8): qty 1

## 2024-01-31 MED ORDER — ATORVASTATIN CALCIUM 80 MG PO TABS
80.0000 mg | ORAL_TABLET | Freq: Every day | ORAL | Status: DC
  Administered 2024-02-01 – 2024-02-06 (×6): 80 mg via ORAL
  Filled 2024-01-31 (×6): qty 1

## 2024-01-31 MED ORDER — ACETAMINOPHEN 650 MG RE SUPP: 650.0000 mg | Freq: Four times a day (QID) | RECTAL | Status: DC | PRN

## 2024-01-31 MED ORDER — HYDRALAZINE HCL 25 MG PO TABS
37.5000 mg | ORAL_TABLET | Freq: Three times a day (TID) | ORAL | Status: DC
  Administered 2024-01-31 – 2024-02-04 (×13): 37.5 mg via ORAL
  Filled 2024-01-31 (×12): qty 2

## 2024-01-31 MED ORDER — BENZONATATE 100 MG PO CAPS
100.0000 mg | ORAL_CAPSULE | Freq: Once | ORAL | Status: AC
  Administered 2024-01-31: 100 mg via ORAL
  Filled 2024-01-31: qty 1

## 2024-01-31 MED ORDER — POTASSIUM CHLORIDE CRYS ER 20 MEQ PO TBCR
40.0000 meq | EXTENDED_RELEASE_TABLET | Freq: Once | ORAL | Status: AC
  Administered 2024-01-31: 40 meq via ORAL
  Filled 2024-01-31: qty 2

## 2024-01-31 MED ORDER — FUROSEMIDE 10 MG/ML IJ SOLN: 40.0000 mg | Freq: Once | INTRAMUSCULAR | Status: DC

## 2024-01-31 MED ORDER — ONDANSETRON HCL 4 MG/2ML IJ SOLN: 4.0000 mg | Freq: Four times a day (QID) | INTRAMUSCULAR | Status: DC | PRN

## 2024-01-31 MED ORDER — ENOXAPARIN SODIUM 40 MG/0.4ML IJ SOSY: 40.0000 mg | PREFILLED_SYRINGE | INTRAMUSCULAR | Status: DC

## 2024-01-31 MED ORDER — FLUTICASONE PROPIONATE 50 MCG/ACT NA SUSP
2.0000 | Freq: Two times a day (BID) | NASAL | Status: DC
  Administered 2024-01-31 – 2024-02-06 (×12): 2 via NASAL
  Filled 2024-01-31: qty 16

## 2024-01-31 MED ORDER — ACETAMINOPHEN 325 MG PO TABS
650.0000 mg | ORAL_TABLET | Freq: Four times a day (QID) | ORAL | Status: DC | PRN
  Administered 2024-02-02 – 2024-02-06 (×3): 650 mg via ORAL
  Filled 2024-01-31 (×3): qty 2

## 2024-01-31 MED ORDER — IPRATROPIUM-ALBUTEROL 0.5-2.5 (3) MG/3ML IN SOLN
3.0000 mL | Freq: Once | RESPIRATORY_TRACT | Status: AC
Start: 2024-01-31 — End: 2024-01-31

## 2024-01-31 MED ORDER — BUDESONIDE 0.25 MG/2ML IN SUSP
0.2500 mg | Freq: Two times a day (BID) | RESPIRATORY_TRACT | Status: DC
  Administered 2024-01-31 – 2024-02-06 (×13): 0.25 mg via RESPIRATORY_TRACT
  Filled 2024-01-31 (×13): qty 2

## 2024-01-31 MED ORDER — ALBUTEROL SULFATE (2.5 MG/3ML) 0.083% IN NEBU
2.5000 mg | INHALATION_SOLUTION | Freq: Once | RESPIRATORY_TRACT | Status: AC
  Administered 2024-01-31: 2.5 mg via RESPIRATORY_TRACT
  Filled 2024-01-31: qty 3

## 2024-01-31 MED ORDER — ALBUTEROL SULFATE (2.5 MG/3ML) 0.083% IN NEBU: 2.5000 mg | INHALATION_SOLUTION | Freq: Four times a day (QID) | RESPIRATORY_TRACT | Status: DC | PRN

## 2024-01-31 MED ORDER — IPRATROPIUM-ALBUTEROL 0.5-2.5 (3) MG/3ML IN SOLN
3.0000 mL | Freq: Four times a day (QID) | RESPIRATORY_TRACT | Status: DC
  Administered 2024-01-31 – 2024-02-01 (×4): 3 mL via RESPIRATORY_TRACT
  Filled 2024-01-31 (×4): qty 3

## 2024-01-31 MED ORDER — OXYMETAZOLINE HCL 0.05 % NA SOLN
1.0000 | Freq: Two times a day (BID) | NASAL | Status: AC
  Administered 2024-01-31 – 2024-02-02 (×5): 1 via NASAL
  Filled 2024-01-31: qty 30

## 2024-01-31 MED ORDER — FUROSEMIDE 10 MG/ML IJ SOLN
60.0000 mg | Freq: Two times a day (BID) | INTRAMUSCULAR | Status: DC
  Administered 2024-01-31 – 2024-02-02 (×4): 60 mg via INTRAVENOUS
  Filled 2024-01-31 (×4): qty 6

## 2024-01-31 MED ORDER — ONDANSETRON HCL 4 MG PO TABS: 4.0000 mg | ORAL_TABLET | Freq: Four times a day (QID) | ORAL | Status: DC | PRN

## 2024-01-31 MED ORDER — EMPAGLIFLOZIN 10 MG PO TABS
10.0000 mg | ORAL_TABLET | Freq: Every day | ORAL | Status: DC
  Administered 2024-02-01 – 2024-02-02 (×2): 10 mg via ORAL
  Filled 2024-01-31 (×2): qty 1

## 2024-01-31 MED ORDER — ISOSORBIDE MONONITRATE ER 30 MG PO TB24
30.0000 mg | ORAL_TABLET | Freq: Every day | ORAL | Status: DC
  Administered 2024-01-31 – 2024-02-04 (×5): 30 mg via ORAL
  Filled 2024-01-31 (×5): qty 1

## 2024-01-31 MED ORDER — MAGNESIUM SULFATE 2 GM/50ML IV SOLN
2.0000 g | Freq: Once | INTRAVENOUS | Status: AC
  Administered 2024-01-31: 2 g via INTRAVENOUS
  Filled 2024-01-31: qty 50

## 2024-01-31 MED ORDER — APIXABAN 5 MG PO TABS
5.0000 mg | ORAL_TABLET | Freq: Two times a day (BID) | ORAL | Status: DC
  Administered 2024-01-31 – 2024-02-06 (×12): 5 mg via ORAL
  Filled 2024-01-31 (×12): qty 1

## 2024-01-31 MED ORDER — ALBUTEROL SULFATE HFA 108 (90 BASE) MCG/ACT IN AERS: 1.0000 | INHALATION_SPRAY | Freq: Four times a day (QID) | RESPIRATORY_TRACT | Status: DC | PRN

## 2024-01-31 MED ORDER — CARVEDILOL 25 MG PO TABS
25.0000 mg | ORAL_TABLET | Freq: Two times a day (BID) | ORAL | Status: DC
  Administered 2024-01-31 – 2024-02-06 (×12): 25 mg via ORAL
  Filled 2024-01-31 (×12): qty 1

## 2024-01-31 MED ORDER — IPRATROPIUM-ALBUTEROL 0.5-2.5 (3) MG/3ML IN SOLN
RESPIRATORY_TRACT | Status: AC
  Administered 2024-01-31: 3 mL via RESPIRATORY_TRACT
  Filled 2024-01-31: qty 3

## 2024-01-31 MED ORDER — ZOLPIDEM TARTRATE 5 MG PO TABS
5.0000 mg | ORAL_TABLET | Freq: Every evening | ORAL | Status: DC | PRN
  Administered 2024-01-31 – 2024-02-05 (×5): 5 mg via ORAL
  Filled 2024-01-31 (×6): qty 1

## 2024-01-31 NOTE — ED Provider Notes (Signed)
 7:33 AM Care assumed from Dr. Madilyn Hook.  At time of transfer of care, patient is waiting for cardiology consultation as the patient had a 34 beat episode of ventricular tachycardia while she was getting ready for discharge after being evaluated for shortness of breath earlier today.  Plan of care is to see what cardiology thinks about this episode and if anything needs to be done for it.  Anticipate follow-up on cardiology recommendations.  10:58 AM Spoke to cardiology who felt the suspected V. tach episode was actually NSVT.  Oxygen saturation's are in the 90s now however patient keeps having coughing fits and is still short of breath.  Her oxygen was in the 80s and route although she is off of oxygen at this time.  Cardiology was worried that she is still wheezing, she is likely to bounce back later today with hypoxia again and increased work of breathing.  We feel is reasonable to admit for respiratory monitoring and what ever else cardiology may want to evaluate.  Her GFR is less than 30, have less suspicion for PE at this time.  Will defer administration of IV steroids to admitting team and discussion with cardiology.  Anticipate admission for intermittent hypoxia with shortness of breath and wheezing without history of COPD or asthma in the setting of CHF.  Clinical Impression: 1. Hypoxia   2. SOB (shortness of breath)   3. Wheezing     Disposition: Admit  This note was prepared with assistance of Dragon voice recognition software. Occasional wrong-word or sound-a-like substitutions may have occurred due to the inherent limitations of voice recognition software.     Craig Ionescu, Canary Brim, MD 01/31/24 1201

## 2024-01-31 NOTE — Assessment & Plan Note (Signed)
 Echocardiogram with reduced LV systolic function EF 20 to 25%, moderate asymmetric left ventricular hypertrophy of the inferior segment, right ventricle with preserved systolic function, left and right atrium with no enlargement, no significant valvular disease.   Urine output is 800 ml Systolic blood pressure 110 mmHg range.   Plan to continue carvedilol and SGLT 2 inh After load reduction with hydralazine and isosorbide.  No RAAS inhibition due to acutely worsening GFR.   Patient is up 1 kg from yesterday and is having recurrent edema, will resume oral diuretic therapy with furosemide 40 mg daily. (At home she is on 20 mg)

## 2024-01-31 NOTE — ED Notes (Signed)
 Med[employer]onic ICD interrogated x2. Successful interrogation sent at this time

## 2024-01-31 NOTE — ED Notes (Signed)
 PLEASE UPDATE DAUGHTER JAMEKA WILDE [phone]

## 2024-01-31 NOTE — Consult Note (Addendum)
 Cardiology Consultation   Patient ID: [name] MRN: [id number]; DOB: 02-Feb-1961  Admit date: 01/31/2024 Date of Consult: 01/31/2024  PCP:  Alysia Penna, MD   [facility] Providers Cardiologist:  Rollene Rotunda, MD  Electrophysiologist:  Lewayne Bunting, MD       Patient Profile:   [name] is a 63 y.o. female with a hx of nonischemic cardiomyopathy, hypertension, s/p BiV ICD, stage IIIb CKD, paroxysmal atrial fibrillation on Eliquis, acute left CVA in 2023 who is being seen 01/31/2024 for the evaluation of NSVT at the request of Dr. Madilyn Hook.  History of Present Illness:   [name] presented to the ED via EMS for evaluation of shortness of breath. Patient with acute onset of dyspnea with exertion. Per notes, patient found hypoxic at 83% by emergency responders. Saturation improved with non-rebreather and DuoNeb. CXR with no acute findings. Labs showed creatinine 2.27, BNP 459.6 (up from 270.7 on 3/25). CBC without acute findings.   Patient was admitted 03/14-03/18 with acute congestive heart failure symptoms. She was diuresed with IV lasix and titrated on GDMT. Discharge weight 170lbs. Patient seen 01/26/24 in [facility] clinic, was feeling well at that time. She was started on Jardiance 10mg , continued on Lasix 20mg , Coreg 25mg  BID. Hydralazine increased to 37.5mg  TID with Imdur 30mg  daily.   On exam today, patient reports that her weight since discharge has been stable at 167-170lbs. She denies feeling like fluid levels have worsened in the last few days, denies lower extremity swelling which she reports is typically the first sign of hypervolemia for her. She expresses frustration with a chronic dry cough that has seemingly been an issue since last year. Cough is a daily occurrence, worse at night. Describes prolonged coughing spell yesterday that she believes is what lead to hypoxia and call to EMS. She has never been a smoker, denies exposure to second hand smoking as well.  Endorses improvement in symptoms with EMS duoneb.  Past Medical History:  Diagnosis Date   CHF (congestive heart failure) (HCC)    Hypertension    Overweight(278.02)    Sciatica    Secondary cardiomyopathy, unspecified    EF now 50%    Past Surgical History:  Procedure Laterality Date   ABDOMINAL HYSTERECTOMY     BIV ICD INSERTION CRT-D N/A 01/03/2023   Procedure: BIV ICD INSERTION CRT-D;  Surgeon: Marinus Maw, MD;  Location: [facility] INVASIVE CV LAB;  Service: Cardiovascular;  Laterality: N/A;     Home Medications:  Prior to Admission medications   Medication Sig Start Date End Date Taking? Authorizing Provider  Acetaminophen (TYLENOL PO) Take 1,000 mg by mouth daily as needed (for pain).    [provider]  albuterol (VENTOLIN HFA) 108 (90 Base) MCG/ACT inhaler Inhale 1 puff into the lungs every 6 (six) hours as needed. 10/06/23   [provider]  apixaban (ELIQUIS) 5 MG TABS tablet TAKE 1 TABLET(5 MG) BY MOUTH TWICE DAILY 11/03/23   Eustace Pen, PA-C  atorvastatin (LIPITOR) 80 MG tablet Take 1 tablet (80 mg total) by mouth daily. 12/19/21   de Saintclair Halsted, Cortney E, NP  carvedilol (COREG) 25 MG tablet Take 1 tablet (25 mg total) by mouth 2 (two) times daily with a meal. 02/26/23   Rollene Rotunda, MD  empagliflozin (JARDIANCE) 10 MG TABS tablet Take 1 tablet (10 mg total) by mouth daily before breakfast. 01/26/24   Milford, Anderson Malta, FNP  furosemide (LASIX) 20 MG tablet Take 1 tablet (  20 mg total) by mouth daily. 01/20/24   Zannie Cove, MD  hydrALAZINE (APRESOLINE) 25 MG tablet Take 1.5 tablets (37.5 mg total) by mouth 3 (three) times daily. 01/26/24   Jacklynn Ganong, FNP  HYDROcodone-acetaminophen (NORCO/VICODIN) 5-325 MG tablet Take 1 tablet by mouth daily as needed for severe pain (pain score 7-10).    [provider]  isosorbide mononitrate (IMDUR) 30 MG 24 hr tablet Take 1 tablet (30 mg total) by mouth daily. 01/20/24   Zannie Cove, MD  zolpidem  (AMBIEN) 5 MG tablet Take 5 mg by mouth at bedtime as needed for sleep. 01/17/18   [provider]    Inpatient Medications: Scheduled Meds:  Continuous Infusions:  PRN Meds:   Allergies:    Allergies  Allergen Reactions   Latex Itching   Chlorhexidine Gluconate Hives    Unknown if this is the cause of the hives, raised areas around lab draw sites. No tape and area wiped with chlorhexidine.    Social History:   Social History   Socioeconomic History   Marital status: Single    Spouse name: Not on file   Number of children: 3   Years of education: Not on file   Highest education level: High school graduate  Occupational History   Not on file  Tobacco Use   Smoking status: Never   Smokeless tobacco: Never   Tobacco comments:    Never smoke 03/07/23  Vaping Use   Vaping status: Never Used  Substance and Sexual Activity   Alcohol use: Yes    Alcohol/week: 7.0 - 14.0 standard drinks of alcohol    Types: 7 - 14 Cans of beer per week    Comment: 1-2 beers daily 03/07/23   Drug use: No   Sexual activity: Yes    Birth control/protection: Surgical  Other Topics Concern   Not on file  Social History Narrative   Not on file   Social Drivers of Health   Financial Resource Strain: Low Risk  (01/19/2024)   Overall Financial Resource Strain (CARDIA)    Difficulty of Paying Living Expenses: Not hard at all  Food Insecurity: No Food Insecurity (01/17/2024)   Hunger Vital Sign    Worried About Running Out of Food in the Last Year: Never true    Ran Out of Food in the Last Year: Never true  Transportation Needs: No Transportation Needs (01/19/2024)   PRAPARE - Administrator, Civil Service (Medical): No    Lack of Transportation (Non-Medical): No  Physical Activity: Not on file  Stress: Not on file  Social Connections: Not on file  Intimate Partner Violence: Not At Risk (01/17/2024)   Humiliation, Afraid, Rape, and Kick questionnaire    Fear of Current  or Ex-Partner: No    Emotionally Abused: No    Physically Abused: No    Sexually Abused: No    Family History:    Family History  Problem Relation Age of Onset   Cerebral aneurysm Mother 29       ruptured   Hypertension Mother        severe   Heart attack Father 63   Coronary artery disease Other    Hypertension Brother        severe   Colon cancer Neg Hx    Breast cancer Neg Hx      ROS:  Please see the history of present illness.   All other ROS reviewed and negative.     Physical  Exam/Data:   Vitals:   01/31/24 0700 01/31/24 0730 01/31/24 0745 01/31/24 0807  BP:  (!) 168/105 (!) 126/100   Pulse: 81 91 94   Resp: (!) 26 (!) 27 (!) 32   Temp:    98.2 F (36.8 C)  TempSrc:    Oral  SpO2: 97% 96% 95%    No intake or output data in the 24 hours ending 01/31/24 0904    01/26/2024    3:02 PM 01/20/2024    5:18 AM 01/19/2024    4:12 AM  Last 3 Weights  Weight (lbs) 172 lb 6.4 oz 170 lb 167 lb 5.3 oz  Weight (kg) 78.2 kg 77.111 kg 75.9 kg     There is no height or weight on file to calculate BMI.  General:  Well nourished, well developed, in no acute distress HEENT: normal Neck: no JVD Vascular: No carotid bruits; Distal pulses 2+ bilaterally Cardiac:  normal S1, S2; RRR; no murmur  Lungs: diffuse inspiratory wheezing Abd: soft, nontender, no hepatomegaly  Ext: no edema Musculoskeletal:  No deformities, BUE and BLE strength normal and equal Skin: warm and dry  Neuro:  CNs 2-12 intact, no focal abnormalities noted Psych:  Normal affect   EKG:  The EKG was personally reviewed and demonstrates:  atrial sensed/ventricular paced.  Telemetry:  Telemetry was personally reviewed and demonstrates:  primarily atrial sensed/ventricular paced rhythm with isolated PVCs and intermittent runs of NSVT. There is a 38 beat run of tachycardia that appears to be AT rather than NSVT.   Relevant CV Studies:  01/17/24 Limited TTE  IMPRESSIONS     1. Left ventricular ejection  fraction, by estimation, is 20 to 25%. The  left ventricle has severely decreased function. There is moderate  asymmetric left ventricular hypertrophy of the inferior segment. Left  ventricular diastolic parameters are  consistent with Grade I diastolic dysfunction (impaired relaxation).   2. Right ventricular systolic function is normal. The right ventricular  size is normal. Tricuspid regurgitation signal is inadequate for assessing  PA pressure.   3. No evidence of mitral valve regurgitation.   4. The aortic valve was not well visualized. Aortic valve regurgitation  is not visualized.   5. The inferior vena cava is normal in size with greater than 50%  respiratory variability, suggesting right atrial pressure of 3 mmHg.   Comparison(s): No significant change from prior study.   FINDINGS   Left Ventricle: Left ventricular ejection fraction, by estimation, is 20  to 25%. The left ventricle has severely decreased function. There is  moderate asymmetric left ventricular hypertrophy of the inferior segment.  Left ventricular diastolic parameters   are consistent with Grade I diastolic dysfunction (impaired relaxation).   Right Ventricle: The right ventricular size is normal. Right ventricular  systolic function is normal. Tricuspid regurgitation signal is inadequate  for assessing PA pressure.   Left Atrium: Left atrial size was normal in size.   Right Atrium: Right atrial size was normal in size.   Pericardium: There is no evidence of pericardial effusion.   Tricuspid Valve: Tricuspid valve regurgitation is trivial.   Aortic Valve: The aortic valve was not well visualized. Aortic valve  regurgitation is not visualized. Aortic valve peak gradient measures 11.6  mmHg.   Venous: The inferior vena cava is normal in size with greater than 50%  respiratory variability, suggesting right atrial pressure of 3 mmHg.   Additional Comments: A device lead is visualized.   Laboratory  Data:  High Sensitivity  Troponin:   Recent Labs  Lab 01/16/24 2008 01/16/24 2220  TROPONINIHS 21* 28*     Chemistry Recent Labs  Lab 01/26/24 1547 01/31/24 0136  NA 141 140  K 4.1 3.5  CL 111 109  CO2 20* 21*  GLUCOSE 101* 113*  BUN 54* 40*  CREATININE 2.50* 2.27*  CALCIUM 9.1 8.6*  GFRNONAA 21* 24*  ANIONGAP 10 10    Recent Labs  Lab 01/31/24 0136  PROT 6.8  ALBUMIN 3.0*  AST 18  ALT 13  ALKPHOS 73  BILITOT 0.6   Lipids No results for input(s): "CHOL", "TRIG", "HDL", "LABVLDL", "LDLCALC", "CHOLHDL" in the last 168 hours.  Hematology Recent Labs  Lab 01/31/24 0136  WBC 8.2  RBC 3.60*  HGB 11.2*  HCT 34.5*  MCV 95.8  MCH 31.1  MCHC 32.5  RDW 13.8  PLT 225   Thyroid No results for input(s): "TSH", "FREET4" in the last 168 hours.  BNP Recent Labs  Lab 01/26/24 1547 01/31/24 0136  BNP 270.7* 459.6*    DDimer No results for input(s): "DDIMER" in the last 168 hours.   Radiology/Studies:  DG Chest Port 1 View Result Date: 01/31/2024 CLINICAL DATA:  Shortness of breath EXAM: PORTABLE CHEST 1 VIEW COMPARISON:  01/16/2024 FINDINGS: Stable cardiomegaly. Aortic atherosclerotic calcification. Left chest wall ICD. No focal consolidation, pleural effusion, or pneumothorax. No displaced rib fractures. IMPRESSION: No active disease. Cardiomegaly. Electronically Signed   By: Minerva Fester M.D.   On: 01/31/2024 02:09     Assessment and Plan:   Chronic HF with reduced ejection fraction S/P BiV ICD Hypertension Patient with hx HFrEF, in 2006 had EF 30% that subsequently recovered to normal by 2018. In 2023 found to again have reduced EF, 20-25%. cMRI at that time without clear evidence of prior MI or amyloid. Underwent [employer] BiV ICD placement 01/2023. In the ED today in the setting of acute onset dyspnea with exacerbation of chronic cough. On exam does not appear volume up: no pulmonary crackles, elevation of JVP, LE edema. BNP 459.6 (up from 270.7 on 3/25).  OptiVol on device interrogation is rising but still appropriate.  Continue Jardiance 10mg , Lasix 20mg  daily, Imdur 30mg , Hydralazine 37.5mg  TID (may need to up-titrate).  One time dose of IV lasix given rising BNP. Otherwise plan to continue oral regimen inpatient barring significant volume status change.  Will discuss switching patient's Coreg to Metoprolol Succinate given potential to contribute to bronchospasm.  NSVT Primary reason for cardiology consult today was concern of prolonged episodes of NSVT on telemetry. The longest episode of concern appears to be AT rather than NSVT, also supported by ICD interrogation. Patient does have frequent PVCs and intermittent runs of NSVT, limited to 10-15 beats. Overall low concern regarding PVCs and runs of NSVT given her low EF. Can consider lowering device VT detection zone for ongoing monitoring.  Appears to also have infrequent AT as well. Will replace Mg today 1.7) and K (3.6). Goal K >4, Mg>2.  Atrial fibrillation ICD without evidence of atrial fibrillation on interrogation, NSR on ECG.  Continue Eliquis 5mg  BID.   Chronic dry cough Wheezing Patient with diffuse wheezing on exam and dry cough. Appears to have notable dyspnea secondary to frequency of cough. I reached out to ED provider and recommended nebulizer. Would also consider admission to hospital medicine for further treatment as I suspect she would rebound quickly to the ED.   Risk Assessment/Risk Scores:        New York Heart Association (  NYHA) Functional Class NYHA Class II  CHA2DS2-VASc Score = 5   This indicates a 7.2% annual risk of stroke. The patient's score is based upon: CHF History: 1 HTN History: 1 Diabetes History: 0 Stroke History: 2 Vascular Disease History: 0 Age Score: 0 Gender Score: 1         For questions or updates, please contact [facility] Please consult [email or web address] for contact info under    Signed, Perlie Gold, PA-C   01/31/2024 9:04 AM  I have seen, examined the patient, and reviewed the above assessment and plan.    HPI: [name] is a 63 y.o. female with a hx of nonischemic cardiomyopathy, hypertension, s/p BiV ICD, stage IIIb CKD, paroxysmal atrial fibrillation on Eliquis, acute left CVA in 2023 who presented to the ED with a chief complaint of cough.  She states that she has had chronic cough since last fall.  She has had prior admissions for acute decompensated heart failure, most recently few weeks ago.  She does not feel significantly volume overloaded at this time but does have some shortness of breath. But it was a severe coughing spell that ultimately prompted her to call EMS.  She denies any chest pain or palpitations.  GEN: No acute distress.   Neck: No JVD. Cardiac: Normal rate and regular rhythm. Resp: Normal work of breathing.  Ext: No edema.  Psych: Normal affect   Assessment:  Patient presents with chronic cough.  I am not entirely sure her cough is entirely explained by her heart failure, but she likely has some degree of mild pulmonary edema based on mildly elevated NT proBNP and shortness of breath.  While on telemetry she was noted to have episodes of wide-complex tachycardia, with longest lasting 20 to 30 seconds.  Device interrogation was performed.  Upon review she has both episodes of likely atrial tach with one-to-one conduction and an underlying bundle branch block as well as short episodes of nonsustained ventricular tachycardia.  She appears to be asymptomatic for both of these.  Problem List: Acute on chronic systolic heart failure status post BiV ICD Nonsustained ventricular tachycardia Paroxysmal atrial tachycardia Atrial fibrillation  Plan:  - Diuresis with IV lasix today. I suspect we may be able to transition to oral diuretics tomorrow.  -Continue GDMT regimen of carvedilol, empagliflozin, hydralazine, isosorbide mononitrate. -I performed bedside device interrogation  and lowered her VT monitor zone to 140 bpm.   Nobie Putnam, MD 01/31/2024 5:14 PM

## 2024-01-31 NOTE — ED Provider Notes (Signed)
 [place] EMERGENCY DEPARTMENT AT [facility] Provider Note   CSN: [id number] Arrival date & time: 01/31/24  0125     History  Chief Complaint  Patient presents with   Shortness of Breath    [name] is a 63 y.o. female.  The history is provided by the patient.  Shortness of Breath [name] is a 63 y.o. female who presents to the Emergency Department complaining of shortness of breath.  She presents to the emergency department by EMS for evaluation of sudden onset shortness of breath that started just prior to ED arrival.  She was in her routine state of health when she developed dyspnea on exertion and EMS was called.  On fire arrival she was found to have sats of 83% on room air and she was placed on nonrebreather.  By EMS she was treated with a DuoNeb.  She does report partial improvement in her symptoms after medication administration.  No associated chest pain, fever.  No abdominal pain, nausea, vomiting.  She does have mild lower extremity edema and this is at her baseline.  She is compliant with all of her medications.  She was discharged from the hospital on March 18.  Has a history of paroxysmal A-fib on anticoagulation, nonischemic cardiomyopathy, hypertension, stage IV CKD, CVA.      Home Medications Prior to Admission medications   Medication Sig Start Date End Date Taking? Authorizing Provider  Acetaminophen (TYLENOL PO) Take 1,000 mg by mouth daily as needed (for pain).    [provider]  albuterol (VENTOLIN HFA) 108 (90 Base) MCG/ACT inhaler Inhale 1 puff into the lungs every 6 (six) hours as needed. 10/06/23   [provider]  apixaban (ELIQUIS) 5 MG TABS tablet TAKE 1 TABLET(5 MG) BY MOUTH TWICE DAILY 11/03/23   Eustace Pen, PA-C  atorvastatin (LIPITOR) 80 MG tablet Take 1 tablet (80 mg total) by mouth daily. 12/19/21   de Saintclair Halsted, Cortney E, NP  carvedilol (COREG) 25 MG tablet Take 1 tablet (25 mg total) by mouth 2 (two) times  daily with a meal. 02/26/23   Rollene Rotunda, MD  empagliflozin (JARDIANCE) 10 MG TABS tablet Take 1 tablet (10 mg total) by mouth daily before breakfast. 01/26/24   Milford, Anderson Malta, FNP  furosemide (LASIX) 20 MG tablet Take 1 tablet (20 mg total) by mouth daily. 01/20/24   Zannie Cove, MD  hydrALAZINE (APRESOLINE) 25 MG tablet Take 1.5 tablets (37.5 mg total) by mouth 3 (three) times daily. 01/26/24   Jacklynn Ganong, FNP  HYDROcodone-acetaminophen (NORCO/VICODIN) 5-325 MG tablet Take 1 tablet by mouth daily as needed for severe pain (pain score 7-10).    [provider]  isosorbide mononitrate (IMDUR) 30 MG 24 hr tablet Take 1 tablet (30 mg total) by mouth daily. 01/20/24   Zannie Cove, MD  zolpidem (AMBIEN) 5 MG tablet Take 5 mg by mouth at bedtime as needed for sleep. 01/17/18   [provider]      Allergies    Latex and Chlorhexidine gluconate    Review of Systems   Review of Systems  Respiratory:  Positive for shortness of breath.   All other systems reviewed and are negative.   Physical Exam Updated Vital Signs BP (!) 152/95   Pulse 86   Temp 97.7 F (36.5 C) (Oral)   Resp (!) 26   SpO2 98%  Physical Exam Vitals and nursing note reviewed.  Constitutional:  Appearance: She is well-developed.  HENT:     Head: Normocephalic and atraumatic.  Cardiovascular:     Rate and Rhythm: Normal rate and regular rhythm.     Heart sounds: No murmur heard. Pulmonary:     Effort: Pulmonary effort is normal. No respiratory distress.     Comments: Mild tachypnea.  Expiratory wheezes and rhonchi Abdominal:     Palpations: Abdomen is soft.     Tenderness: There is no abdominal tenderness. There is no guarding or rebound.  Musculoskeletal:        General: No tenderness.     Comments: 2+ DP pulses.  Trace edema to bilateral lower extremities.  Skin:    General: Skin is warm and dry.  Neurological:     Mental Status: She is alert and oriented to person,  place, and time.  Psychiatric:        Behavior: Behavior normal.     ED Results / Procedures / Treatments   Labs (all labs ordered are listed, but only abnormal results are displayed) Labs Reviewed  COMPREHENSIVE METABOLIC PANEL WITH GFR - Abnormal; Notable for the following components:      Result Value   CO2 21 (*)    Glucose, Bld 113 (*)    BUN 40 (*)    Creatinine, Ser 2.27 (*)    Calcium 8.6 (*)    Albumin 3.0 (*)    GFR, Estimated 24 (*)    All other components within normal limits  BRAIN NATRIURETIC PEPTIDE - Abnormal; Notable for the following components:   B Natriuretic Peptide 459.6 (*)    All other components within normal limits  CBC WITH DIFFERENTIAL/PLATELET - Abnormal; Notable for the following components:   RBC 3.60 (*)    Hemoglobin 11.2 (*)    HCT 34.5 (*)    Eosinophils Absolute 1.0 (*)    All other components within normal limits  RESP PANEL BY RT-PCR (RSV, FLU A&B, COVID)  [id number]    EKG EKG Interpretation Date/Time:  Saturday January 31 2024 01:31:23 EDT Ventricular Rate:  90 PR Interval:  141 QRS Duration:  147 QT Interval:  420 QTC Calculation: 514 R Axis:   29  Text Interpretation: atria sensed ventricular paced rhythm Confirmed by Tilden Fossa [phone]) on 01/31/2024 1:44:51 AM  Radiology DG Chest Port 1 View Result Date: 01/31/2024 CLINICAL DATA:  Shortness of breath EXAM: PORTABLE CHEST 1 VIEW COMPARISON:  01/16/2024 FINDINGS: Stable cardiomegaly. Aortic atherosclerotic calcification. Left chest wall ICD. No focal consolidation, pleural effusion, or pneumothorax. No displaced rib fractures. IMPRESSION: No active disease. Cardiomegaly. Electronically Signed   By: Minerva Fester M.D.   On: 01/31/2024 02:09    Procedures Procedures    Medications Ordered in ED Medications  ipratropium-albuterol (DUONEB) 0.5-2.5 (3) MG/3ML nebulizer solution 3 mL (3 mLs Nebulization Given 01/31/24 0141)  benzonatate (TESSALON) capsule 100 mg (100 mg Oral  Given 01/31/24 2130)    ED Course/ Medical Decision Making/ A&P                                 Medical Decision Making Amount and/or Complexity of Data Reviewed Labs: ordered. Radiology: ordered.  Risk Prescription drug management.   Patient with history of CHF, CKD, reactive airway here for evaluation of shortness of breath with prehospital hypoxia.  Hypoxia has resolved.  She is breathing comfortably on assessment.  Labs with stable renal insufficiency.  BNP is mildly uptrending since  recent hospital discharge.  Patient reports her weight is stable at 167 pounds at home.  She did have nonsustained V. tach on the monitor, approximately 34 beats.  Cardiology consulted for ongoing recommendations.  Patient care transferred pending cardiology evaluation.        Final Clinical Impression(s) / ED Diagnoses Final diagnoses:  None    Rx / DC Orders ED Discharge Orders     None         Tilden Fossa, MD 01/31/24 0700

## 2024-01-31 NOTE — Assessment & Plan Note (Signed)
 Continue blood pressure control, statin therapy and anticoagulation with apixaban.

## 2024-01-31 NOTE — ED Notes (Signed)
 Pt was informed her daughter Dora Sims called for an updated. Pt has personal cell phone at bedside

## 2024-01-31 NOTE — Assessment & Plan Note (Addendum)
 Will add inhaled corticosteroids and continue with bronchodilator therapy with duoneb.  Nasal steroids and 3 days of afrin Patient will need outpatient pulmonary function testing when more stable.  She may need maintenance inhalers.  Continue oxymetry monitoring.

## 2024-01-31 NOTE — Plan of Care (Signed)
   Problem: Education: Goal: Knowledge of General Education information will improve Description Including pain rating scale, medication(s)/side effects and non-pharmacologic comfort measures Outcome: Progressing

## 2024-01-31 NOTE — H&P (Addendum)
 History and Physical    Patient: [name] DOB: Jan 22, 1961 DOA: 01/31/2024 DOS: the patient was seen and examined on 01/31/2024 PCP: Alysia Penna, MD  Patient coming from: Home  Chief Complaint:  Chief Complaint  Patient presents with   Shortness of Breath   HPI: [name] is a 63 y.o. female with medical history significant of heart failure, hypertension, overweight and sciatica who presented with dyspnea.   Patient was recently hospitalized 03/14 to 01/20/24 for URI with hyperreactive airway disease, and acute on chronic systolic heart failure exacerbation. She was treated with systemic steroids and IV furosemide. Instructed to continue prednisone 20 mg until 03/19.   She was doing well at home until last night when she noticed recurrent dyspnea. Apparently it was late afternoon early evening when she went to her restroom, on her way back she felt recurrent dyspnea. She had to seat on her bed to catch her breath. Denies any wheezing or cough.  She used her inhaler with no improvement in her symptoms. Because persistent and severe dyspnea she called EMS. She was found with 02 saturation of 83% on room air, she received bronchodilator therapy and was transported to the ED.   She has been having sinus congestion and postnasal dripping, not completely improved with nasal spry.  Until last night she has been active at home able to do her daily activities of daily living, she lives with her nephew.    Review of Systems: As mentioned in the history of present illness. All other systems reviewed and are negative. Past Medical History:  Diagnosis Date   CHF (congestive heart failure) (HCC)    Hypertension    Overweight(278.02)    Sciatica    Secondary cardiomyopathy, unspecified    EF now 50%   Past Surgical History:  Procedure Laterality Date   ABDOMINAL HYSTERECTOMY     BIV ICD INSERTION CRT-D N/A 01/03/2023   Procedure: BIV ICD INSERTION CRT-D;  Surgeon: Marinus Maw, MD;  Location: [facility] INVASIVE CV LAB;  Service: Cardiovascular;  Laterality: N/A;   Social History:  reports that she has never smoked. She has never used smokeless tobacco. She reports current alcohol use of about 7.0 - 14.0 standard drinks of alcohol per week. She reports that she does not use drugs.  Allergies  Allergen Reactions   Latex Itching   Chlorhexidine Gluconate Hives    Unknown if this is the cause of the hives, raised areas around lab draw sites. No tape and area wiped with chlorhexidine.    Family History  Problem Relation Age of Onset   Cerebral aneurysm Mother 67       ruptured   Hypertension Mother        severe   Heart attack Father 11   Coronary artery disease Other    Hypertension Brother        severe   Colon cancer Neg Hx    Breast cancer Neg Hx     Prior to Admission medications   Medication Sig Start Date End Date Taking? Authorizing Provider  Acetaminophen (TYLENOL PO) Take 1,000 mg by mouth daily as needed (for pain).    [provider]  albuterol (VENTOLIN HFA) 108 (90 Base) MCG/ACT inhaler Inhale 1 puff into the lungs every 6 (six) hours as needed. 10/06/23   [provider]  apixaban (ELIQUIS) 5 MG TABS tablet TAKE 1 TABLET(5 MG) BY MOUTH TWICE DAILY 11/03/23   Eustace Pen, PA-C  atorvastatin (LIPITOR)  80 MG tablet Take 1 tablet (80 mg total) by mouth daily. 12/19/21   de Saintclair Halsted, Cortney E, NP  carvedilol (COREG) 25 MG tablet Take 1 tablet (25 mg total) by mouth 2 (two) times daily with a meal. 02/26/23   Rollene Rotunda, MD  empagliflozin (JARDIANCE) 10 MG TABS tablet Take 1 tablet (10 mg total) by mouth daily before breakfast. 01/26/24   Milford, Anderson Malta, FNP  furosemide (LASIX) 20 MG tablet Take 1 tablet (20 mg total) by mouth daily. 01/20/24   Zannie Cove, MD  hydrALAZINE (APRESOLINE) 25 MG tablet Take 1.5 tablets (37.5 mg total) by mouth 3 (three) times daily. 01/26/24   Jacklynn Ganong, FNP   HYDROcodone-acetaminophen (NORCO/VICODIN) 5-325 MG tablet Take 1 tablet by mouth daily as needed for severe pain (pain score 7-10).    [provider]  isosorbide mononitrate (IMDUR) 30 MG 24 hr tablet Take 1 tablet (30 mg total) by mouth daily. 01/20/24   Zannie Cove, MD  zolpidem (AMBIEN) 5 MG tablet Take 5 mg by mouth at bedtime as needed for sleep. 01/17/18   [provider]    Physical Exam: Vitals:   01/31/24 0730 01/31/24 0745 01/31/24 0807 01/31/24 0900  BP: (!) 168/105 (!) 126/100  (!) 154/95  Pulse: 91 94  91  Resp: (!) 27 (!) 32  (!) 24  Temp:   98.2 F (36.8 C)   TempSrc:   Oral   SpO2: 96% 95%  96%   Neurology awake and alert, deconditioned ENT with mild pallor Cardiovascular with S1 and S2 present and regular with no gallops, rubs or murmurs No JVD Respiratory with mild rales scattered bilaterally with no wheezing or rhonchi,. No increased work of breathing Abdomen with no distention  Positive mild lower extremity edema non pitting at the ankles.   Data Reviewed:   Na 140, K 3,5 Cl 109, bicarbonate 21, glucose 113, bun 40 cr 2,27  AST 18 ALT 13  BNP 459  Wbc 8,2 hgb 11.2 plt 225   Sars covid 19 negative Influenza negative  RSV negative   Chest radiograph with cardiomegaly, with bilateral hilar vascular congestion with no infiltrates or effusions, pacemaker defibrillator in place with one right atrial lead, one right ventricular and one left ventricular leads.   EKG 90 bpm, normal axis, qtc 514, left bundle branch block, atrial sensed and ventricular paced rhythm, with no significant ST segment or T wave changes.   Assessment and Plan: * Acute on chronic systolic CHF (congestive heart failure), NYHA class 4 (HCC) Echocardiogram with reduced LV systolic function EF 20 to 25%, moderate asymmetric left ventricular hypertrophy of the inferior segment, right ventricle with preserved systolic function, left and right atrium with no enlargement,  no significant valvular disease.   Plan to continue diuresis with furosemide 60 mg IV bid.  After load reduction with hydralazine and isosorbide.  Continue SGLLT 2 inh and carvedilol.   Hyperactive airway disease Will add inhaled corticosteroids and continue with bronchodilator therapy with duoneb.  Nasal steroids and 3 days of afrin Patient will need outpatient pulmonary function testing when more stable.  She may need maintenance inhalers.  Continue oxymetry monitoring.   Paroxysmal A-fib [facility]) Patient will continue carvedilol for rate control and continue apixaban for anticoagulation.  Currently patient with a ventricular paced rhythm.  Apparently she had a brief episode of SVT in the ED.  Per ED defibrillator was interrogated with no ventricular arrhythmias   CKD (chronic kidney disease), stage IV (  HCC) Renal function stable with serum cr at 2,27  Continue diuresis with furosemide and follow up renal function in am.  Add 40 meq Kcl to avoid hypokalemia.  Avoid hypotension and nephrotoxic medications.   Hyperlipidemia Continue with statin therapy.   Essential hypertension Continue blood pressure control with hydralazine, isosorbide and carvedilol.    History of CVA (cerebrovascular accident) Continue blood pressure control, statin therapy and anticoagulation with apixaban.       Advance Care Planning:   Code Status: Full Code   Consults: Cardiology in the ED   Family Communication: no family at the bedside   Severity of Illness: The appropriate patient status for this patient is INPATIENT. Inpatient status is judged to be reasonable and necessary in order to provide the required intensity of service to ensure the patient's safety. The patient's presenting symptoms, physical exam findings, and initial radiographic and laboratory data in the context of their chronic comorbidities is felt to place them at high risk for further clinical deterioration. Furthermore, it is  not anticipated that the patient will be medically stable for discharge from the hospital within 2 midnights of admission.   * I certify that at the point of admission it is my clinical judgment that the patient will require inpatient hospital care spanning beyond 2 midnights from the point of admission due to high intensity of service, high risk for further deterioration and high frequency of surveillance required.*  Author: Coralie Keens, MD 01/31/2024 11:27 AM  For on call review [email or web address].

## 2024-01-31 NOTE — Assessment & Plan Note (Signed)
Continue blood pressure control with hydralazine, isosorbide and carvedilol.

## 2024-01-31 NOTE — ED Notes (Signed)
 Dr. Madilyn Hook at bedside, call from [facility] that cardiac monitor was alarming, upon this RN assessment when entering room, V-Tach on the monitor rate 120-130s. Pt appeared to be at rest with eyes closed, RR even and unlabored, Family at bedside. Pt awoke, stated no CP or feelings of being "shocked". Pt reports she feels at ease or no discomfort. Dr. Madilyn Hook advised no verbal orders at this time, no meds to be administer. Dr. Madilyn Hook stated she will consult cardiology.

## 2024-01-31 NOTE — Assessment & Plan Note (Signed)
 Renal function stable with serum cr at 2,27  Continue diuresis with furosemide and follow up renal function in am.  Add 40 meq Kcl to avoid hypokalemia.  Avoid hypotension and nephrotoxic medications.

## 2024-01-31 NOTE — Assessment & Plan Note (Signed)
 Continue with statin therapy.  ?

## 2024-01-31 NOTE — Assessment & Plan Note (Signed)
 Patient will continue carvedilol for rate control and continue apixaban for anticoagulation.  Currently patient with a ventricular paced rhythm.  Apparently she had a brief episode of SVT in the ED.  Per ED defibrillator was interrogated with no ventricular arrhythmias

## 2024-01-31 NOTE — ED Notes (Signed)
 Pt admitted to [facility]

## 2024-01-31 NOTE — ED Triage Notes (Signed)
 Pt arrives via [facility] from home d/t sudden onset of SOB while going to the bathroom. Used her inhaler at home without relief. Found to be 83 RA by fire. DuoNeb given in route by EMS. 20G RAC.   EMS VS 160/96, HR 91,

## 2024-02-01 DIAGNOSIS — I5023 Acute on chronic systolic (congestive) heart failure: Secondary | ICD-10-CM | POA: Diagnosis not present

## 2024-02-01 DIAGNOSIS — R0602 Shortness of breath: Secondary | ICD-10-CM | POA: Diagnosis not present

## 2024-02-01 DIAGNOSIS — R062 Wheezing: Secondary | ICD-10-CM

## 2024-02-01 DIAGNOSIS — R0902 Hypoxemia: Secondary | ICD-10-CM

## 2024-02-01 LAB — BASIC METABOLIC PANEL WITH GFR
Anion gap: 8 (ref 5–15)
BUN: 41 mg/dL — ABNORMAL HIGH (ref 6–20)
CO2: 23 mmol/L (ref 22–32)
Calcium: 8.7 mg/dL — ABNORMAL LOW (ref 8.9–10.3)
Chloride: 109 mmol/L (ref 98–111)
Creatinine, Ser: 2.31 mg/dL — ABNORMAL HIGH (ref 0.44–1.00)
GFR, Estimated: 24 mL/min — ABNORMAL LOW (ref >60)
Glucose, Bld: 100 mg/dL — ABNORMAL HIGH (ref 70–99)
Potassium: 3.2 mmol/L — ABNORMAL LOW (ref 3.5–5.1)
Sodium: 140 mmol/L (ref 135–145)

## 2024-02-01 LAB — CBC
HCT: 31.5 % — ABNORMAL LOW (ref 36.0–46.0)
Hemoglobin: 10.6 g/dL — ABNORMAL LOW (ref 12.0–15.0)
MCH: 31.5 pg (ref 26.0–34.0)
MCHC: 33.7 g/dL (ref 30.0–36.0)
MCV: 93.8 fL (ref 80.0–100.0)
Platelets: 216 10*3/uL (ref 150–400)
RBC: 3.36 MIL/uL — ABNORMAL LOW (ref 3.87–5.11)
RDW: 13.8 % (ref 11.5–15.5)
WBC: 9.1 10*3/uL (ref 4.0–10.5)
nRBC: 0 % (ref 0.0–0.2)

## 2024-02-01 MED ORDER — IPRATROPIUM-ALBUTEROL 0.5-2.5 (3) MG/3ML IN SOLN
3.0000 mL | Freq: Two times a day (BID) | RESPIRATORY_TRACT | Status: DC
  Administered 2024-02-01 – 2024-02-02 (×2): 3 mL via RESPIRATORY_TRACT
  Filled 2024-02-01 (×2): qty 3

## 2024-02-01 MED ORDER — POTASSIUM CHLORIDE CRYS ER 20 MEQ PO TBCR
40.0000 meq | EXTENDED_RELEASE_TABLET | Freq: Once | ORAL | Status: AC
  Administered 2024-02-01: 40 meq via ORAL
  Filled 2024-02-01: qty 2

## 2024-02-01 NOTE — Progress Notes (Signed)
 [facility]                                                                              Mitzi Lilja, is a 63 y.o. female, DOB - [birth year], ZOX:[id number] Admit date - 01/31/2024    Outpatient Primary MD for the patient is Alysia Penna, MD  LOS - 1  days  Chief Complaint  Patient presents with   Shortness of Breath       Brief summary   Patient is a 63 year old female with CHF, hypertension, sciatica, presented with shortness of breath.  She was recently hospitalized 03/14 to 01/20/24 for URI with hyperreactive airway disease, and acute on chronic systolic heart failure exacerbation. She was treated with systemic steroids and IV furosemide. Instructed to continue prednisone 20 mg until 03/19.  She was doing well at home until a night before the admission, when she noticed recommended dyspnea, worse on exertion.  No acute wheezing or cough.  She used an inhaler with no improvement in her symptoms.  Because of persistent and severe dyspnea, called EMS, O2 sats were 83% on room air, received bronchodilator therapy. Patient with sinus congestion and postnasal drip, not confusion improved with nasal spray.  Assessment & Plan    Principal Problem:   Acute on chronic systolic CHF (congestive heart failure), NYHA class 4 (HCC) -Recent 2D echo on 01/17/2024 showed EF of 20 to 25%, G1 DD, no significant valvular disease -Continue IV Lasix 60 mg twice daily, strict I's and O's and daily weights, receiving extra dose of Lasix 40 mg IV  -Seen by cardiology today, recommended to continue IV diuresis -Continue GDMT regimen with Coreg, hydralazine, isosorbide, SGLT2 inhibitor -Strict I's and O's and daily weights   Hyperactive airway disease -Continue Flonase, 3 days of Afrin  -Continue maintenance inhalers, scheduled DuoNebs -Home O2 evaluation prior to discharge, -Patient had completed prednisone taper after discharge.   Paroxysmal A-fib (HCC) -Continue Coreg for rate  control -Continue eliquis  Nonsustained V. Tach -Continue Coreg, seen by cardiology.  Apparently had a brief episode of NSVT in the ED -Per Dr. Jimmey Ralph, did bedside device interrogation and lowered her VT monitor zone to 140 bpm no arrhythmias on telemetry in the interim.  -Continue apixaban      CKD (chronic kidney disease), stage IV (HCC) Baseline renal function 2.5-2.9.  Creatinine was 2.5 on 01/26/2024  -Monitor closely with IV diuresis, currently at baseline   Hyperlipidemia Continue statin   Essential hypertension Continue hydralazine, isosorbide, Coreg      History of CVA (cerebrovascular accident) -Continue blood pressure control, statin therapy -Continue apixaban   Estimated body mass index is 26.03 kg/m as calculated from the following:   Height as of this encounter: 5\' 7"  (1.702 m).   Weight as of this encounter: 75.4 kg.  Code Status: Full code DVT Prophylaxis:  SCDs Start: 01/31/24 1319 apixaban (ELIQUIS) tablet 5 mg   Level of Care: Level of care: Telemetry Cardiac Family Communication: Updated patient Disposition Plan:      Remains inpatient appropriate:      Procedures:  Device interrogation  Consultants:   Cardiology  Antimicrobials:   Anti-infectives (  From admission, onward)    None          Medications  apixaban  5 mg Oral BID   atorvastatin  80 mg Oral Daily   budesonide (PULMICORT) nebulizer solution  0.25 mg Nebulization BID   carvedilol  25 mg Oral BID WC   empagliflozin  10 mg Oral QAC breakfast   fluticasone  2 spray Each Nare BID   furosemide  40 mg Intravenous Once   furosemide  60 mg Intravenous Q12H   hydrALAZINE  37.5 mg Oral TID   ipratropium-albuterol  3 mL Nebulization BID   isosorbide mononitrate  30 mg Oral Daily   oxymetazoline  1 spray Each Nare BID      Subjective:   Rina Adney was seen and examined today.  Feeling somewhat better today, no acute shortness of breath at rest.  No chest pain, nausea vomiting  or abdominal pain.   No acute events overnight.    Objective:   Vitals:   02/01/24 0358 02/01/24 0438 02/01/24 0723 02/01/24 0810  BP: (!) 142/98  (!) 140/99   Pulse: 92  83   Resp: 17  20   Temp: 98.2 F (36.8 C)  98.4 F (36.9 C)   TempSrc: Oral  Oral   SpO2: 95%  95% 95%  Weight:  75.4 kg    Height:        Intake/Output Summary (Last 24 hours) at 02/01/2024 1135 Last data filed at 02/01/2024 0835 Gross per 24 hour  Intake 120 ml  Output --  Net 120 ml     Wt Readings from Last 3 Encounters:  02/01/24 75.4 kg  01/26/24 78.2 kg  01/20/24 77.1 kg     Exam General: Alert and oriented x 3, NAD Cardiovascular: S1 S2 auscultated,  RRR, JVD mildly elevated Respiratory: Decreased breath sound at the bases, fairly clear, no wheezing Gastrointestinal: Soft, nontender, nondistended, + bowel sounds Ext: no pedal edema bilaterally Neuro no new deficits Psych: Normal affect     Data Reviewed:  I have personally reviewed following labs    CBC Lab Results  Component Value Date   WBC 9.1 02/01/2024   RBC 3.36 (L) 02/01/2024   HGB 10.6 (L) 02/01/2024   HCT 31.5 (L) 02/01/2024   MCV 93.8 02/01/2024   MCH 31.5 02/01/2024   PLT 216 02/01/2024   MCHC 33.7 02/01/2024   RDW 13.8 02/01/2024   LYMPHSABS 1.0 01/31/2024   MONOABS 0.6 01/31/2024   EOSABS 1.0 (H) 01/31/2024   BASOSABS 0.1 01/31/2024     Last metabolic panel Lab Results  Component Value Date   NA 140 02/01/2024   K 3.2 (L) 02/01/2024   CL 109 02/01/2024   CO2 23 02/01/2024   BUN 41 (H) 02/01/2024   CREATININE 2.31 (H) 02/01/2024   GLUCOSE 100 (H) 02/01/2024   GFRNONAA 24 (L) 02/01/2024   GFRAA 34 (L) 01/30/2015   CALCIUM 8.7 (L) 02/01/2024   PROT 6.8 01/31/2024   ALBUMIN 3.0 (L) 01/31/2024   BILITOT 0.6 01/31/2024   ALKPHOS 73 01/31/2024   AST 18 01/31/2024   ALT 13 01/31/2024   ANIONGAP 8 02/01/2024    CBG (last 3)  No results for input(s): "GLUCAP" in the last 72 hours.    Coagulation  Profile: No results for input(s): "INR", "PROTIME" in the last 168 hours.   Radiology Studies: I have personally reviewed the imaging studies  DG Chest Port 1 View Result Date: 01/31/2024 CLINICAL DATA:  Shortness of breath  EXAM: PORTABLE CHEST 1 VIEW COMPARISON:  01/16/2024 FINDINGS: Stable cardiomegaly. Aortic atherosclerotic calcification. Left chest wall ICD. No focal consolidation, pleural effusion, or pneumothorax. No displaced rib fractures. IMPRESSION: No active disease. Cardiomegaly. Electronically Signed   By: Minerva Fester M.D.   On: 01/31/2024 02:09       Admire Bunnell M.D. [facility] 02/01/2024, 11:35 AM  Available via Epic secure chat 7am-7pm After 7 pm, please refer to night coverage provider listed on amion.

## 2024-02-01 NOTE — Progress Notes (Signed)
   Patient Name: [name] Date of Encounter: 02/01/2024 [facility] Cardiologist: Rollene Rotunda, MD   Interval Summary  .    No acute overnight events. Reports feeling improved today. Less cough and SOB. No new or acute complaints.   Vital Signs .    Vitals:   02/01/24 0358 02/01/24 0438 02/01/24 0723 02/01/24 0810  BP: (!) 142/98  (!) 140/99   Pulse: 92  83   Resp: 17  20   Temp: 98.2 F (36.8 C)  98.4 F (36.9 C)   TempSrc: Oral  Oral   SpO2: 95%  95% 95%  Weight:  75.4 kg    Height:        Intake/Output Summary (Last 24 hours) at 02/01/2024 0925 Last data filed at 02/01/2024 0835 Gross per 24 hour  Intake 120 ml  Output --  Net 120 ml      02/01/2024    4:38 AM 01/31/2024    2:18 PM 01/26/2024    3:02 PM  Last 3 Weights  Weight (lbs) 166 lb 3.6 oz 169 lb 15.6 oz 172 lb 6.4 oz  Weight (kg) 75.4 kg 77.1 kg 78.2 kg      Telemetry/ECG    BiV paced, no arrhythmias in past 24 hours - Personally Reviewed  Physical Exam .   GEN: No acute distress.   Neck: JVD mildly elevated. Cardiac: Normal rate, regular rhythm. Respiratory: Clear to auscultation bilaterally. GI: Soft, nontender, non-distended  MS: No edema  Assessment & Plan .     Patient presents with chronic cough.  I am not entirely sure her cough is entirely explained by her heart failure given the chronicity dating back months, but she likely has some degree of pulmonary edema based on mildly elevated NT proBNP, mildly elevated JVD and shortness of breath.  While on telemetry she was noted to have episodes of wide-complex tachycardia, with longest lasting 20 to 30 seconds.  Device interrogation was performed.  Upon review she has both episodes of likely atrial tach with one-to-one conduction and an underlying bundle branch block as well as short episodes of nonsustained ventricular tachycardia.  She appears to be asymptomatic for both of these.   Problem List: Acute on chronic systolic heart  failure status post BiV ICD Nonsustained ventricular tachycardia Paroxysmal atrial tachycardia Atrial fibrillation   Plan:  - JVD remains mildly elevated. Creatine relatively stable. Would continue with IV diuresis today - IV lasix 60mg  BID. I suspect we may be able to transition to oral diuretics tomorrow. Continue close monitoring of electrolytes and kidney function. -Continue GDMT regimen of carvedilol, empagliflozin, hydralazine, isosorbide mononitrate. -Yesterday, I performed bedside device interrogation and lowered her VT monitor zone to 140 bpm. No arrhythmias on tele in the interim.   For questions or updates, please contact [facility] Please consult [email or web address] for contact info under        Signed, Nobie Putnam, MD

## 2024-02-02 DIAGNOSIS — R0902 Hypoxemia: Secondary | ICD-10-CM | POA: Diagnosis not present

## 2024-02-02 DIAGNOSIS — R0602 Shortness of breath: Secondary | ICD-10-CM | POA: Diagnosis not present

## 2024-02-02 DIAGNOSIS — I480 Paroxysmal atrial fibrillation: Secondary | ICD-10-CM | POA: Diagnosis not present

## 2024-02-02 DIAGNOSIS — R062 Wheezing: Secondary | ICD-10-CM | POA: Diagnosis not present

## 2024-02-02 DIAGNOSIS — I10 Essential (primary) hypertension: Secondary | ICD-10-CM | POA: Diagnosis not present

## 2024-02-02 DIAGNOSIS — N184 Chronic kidney disease, stage 4 (severe): Secondary | ICD-10-CM | POA: Diagnosis not present

## 2024-02-02 DIAGNOSIS — I5023 Acute on chronic systolic (congestive) heart failure: Secondary | ICD-10-CM | POA: Diagnosis not present

## 2024-02-02 LAB — RENAL FUNCTION PANEL
Albumin: 3.3 g/dL — ABNORMAL LOW (ref 3.5–5.0)
Anion gap: 12 (ref 5–15)
BUN: 48 mg/dL — ABNORMAL HIGH (ref 6–20)
CO2: 23 mmol/L (ref 22–32)
Calcium: 9.2 mg/dL (ref 8.9–10.3)
Chloride: 107 mmol/L (ref 98–111)
Creatinine, Ser: 2.99 mg/dL — ABNORMAL HIGH (ref 0.44–1.00)
GFR, Estimated: 17 mL/min — ABNORMAL LOW (ref >60)
Glucose, Bld: 113 mg/dL — ABNORMAL HIGH (ref 70–99)
Phosphorus: 3.7 mg/dL (ref 2.5–4.6)
Potassium: 3.7 mmol/L (ref 3.5–5.1)
Sodium: 142 mmol/L (ref 135–145)

## 2024-02-02 LAB — CBC
HCT: 35.7 % — ABNORMAL LOW (ref 36.0–46.0)
Hemoglobin: 11.6 g/dL — ABNORMAL LOW (ref 12.0–15.0)
MCH: 30.4 pg (ref 26.0–34.0)
MCHC: 32.5 g/dL (ref 30.0–36.0)
MCV: 93.7 fL (ref 80.0–100.0)
Platelets: 252 10*3/uL (ref 150–400)
RBC: 3.81 MIL/uL — ABNORMAL LOW (ref 3.87–5.11)
RDW: 13.7 % (ref 11.5–15.5)
WBC: 9.1 10*3/uL (ref 4.0–10.5)
nRBC: 0 % (ref 0.0–0.2)

## 2024-02-02 LAB — BRAIN NATRIURETIC PEPTIDE: B Natriuretic Peptide: 265.8 pg/mL — ABNORMAL HIGH (ref 0.0–100.0)

## 2024-02-02 MED ORDER — AMOXICILLIN-POT CLAVULANATE 500-125 MG PO TABS
1.0000 | ORAL_TABLET | Freq: Two times a day (BID) | ORAL | Status: DC
Start: 2024-02-02 — End: 2024-02-04
  Administered 2024-02-02 – 2024-02-04 (×5): 1 via ORAL
  Filled 2024-02-02 (×6): qty 1

## 2024-02-02 MED ORDER — IPRATROPIUM-ALBUTEROL 0.5-2.5 (3) MG/3ML IN SOLN
3.0000 mL | Freq: Four times a day (QID) | RESPIRATORY_TRACT | Status: DC
  Administered 2024-02-02 – 2024-02-03 (×2): 3 mL via RESPIRATORY_TRACT
  Filled 2024-02-02 (×2): qty 3

## 2024-02-02 MED ORDER — METHYLPREDNISOLONE SODIUM SUCC 125 MG IJ SOLR
60.0000 mg | Freq: Two times a day (BID) | INTRAMUSCULAR | Status: DC
  Administered 2024-02-02 – 2024-02-03 (×2): 60 mg via INTRAVENOUS
  Filled 2024-02-02 (×2): qty 2

## 2024-02-02 MED ORDER — METHYLPREDNISOLONE SODIUM SUCC 125 MG IJ SOLR
125.0000 mg | Freq: Once | INTRAMUSCULAR | Status: AC
  Administered 2024-02-02: 125 mg via INTRAVENOUS
  Filled 2024-02-02: qty 2

## 2024-02-02 MED ORDER — ARFORMOTEROL TARTRATE 15 MCG/2ML IN NEBU
15.0000 ug | INHALATION_SOLUTION | Freq: Two times a day (BID) | RESPIRATORY_TRACT | Status: DC
  Administered 2024-02-02 – 2024-02-06 (×9): 15 ug via RESPIRATORY_TRACT
  Filled 2024-02-02 (×10): qty 2

## 2024-02-02 MED ORDER — TORSEMIDE 20 MG PO TABS: 20.0000 mg | ORAL_TABLET | Freq: Every day | ORAL | Status: DC

## 2024-02-02 NOTE — Progress Notes (Signed)
 PHARMACY NOTE:  ANTIMICROBIAL RENAL DOSAGE ADJUSTMENT  Current antimicrobial regimen includes a mismatch between antimicrobial dosage and estimated renal function.  As per policy approved by the Pharmacy & Therapeutics and Medical Executive Committees, the antimicrobial dosage will be adjusted accordingly.  Current antimicrobial dosage:  Augmentin 875-125mg  po every 12 hours  Indication: ?Respiratory infection (will follow up and update)   Renal Function:  CKD stage IV, baseline SCr 2.5-2.5.   Scr 2.99 today.  Estimated Creatinine Clearance: 21.1 mL/min (A) (by C-G formula based on SCr of 2.99 mg/dL (H)). []      On intermittent HD, scheduled: []      On CRRT    Antimicrobial dosage has been changed to:  Augmentin 500mg -125mg  po every 12 hours  Additional comments:   Thank you for allowing pharmacy to be a part of this patient's care.  Noah Delaine, RPh Clinical Pharmacist  02/02/2024 9:11 AM

## 2024-02-02 NOTE — Plan of Care (Signed)
   Problem: Clinical Measurements: Goal: Respiratory complications will improve Outcome: Progressing   Problem: Activity: Goal: Risk for activity intolerance will decrease Outcome: Progressing

## 2024-02-02 NOTE — Progress Notes (Signed)
 [facility]                                                                              [name], is a 63 y.o. female, DOB - 11-25-1960, XLK:[id number] Admit date - 01/31/2024    Outpatient Primary MD for the patient is [name] Penna, MD  LOS - 2  days  Chief Complaint  Patient presents with   Shortness of Breath       Brief summary   Patient is a 63 year old female with CHF, hypertension, sciatica, presented with shortness of breath.  She was recently hospitalized 03/14 to 01/20/24 for URI with hyperreactive airway disease, and acute on chronic systolic heart failure exacerbation. She was treated with systemic steroids and IV furosemide. Instructed to continue prednisone 20 mg until 03/19.  She was doing well at home until a night before the admission, when she noticed recommended dyspnea, worse on exertion.  No acute wheezing or cough.  She used an inhaler with no improvement in her symptoms.  Because of persistent and severe dyspnea, called EMS, O2 sats were 83% on room air, received bronchodilator therapy. Patient with sinus congestion and postnasal drip, not confusion improved with nasal spray.  Assessment & Plan    Principal Problem:   Acute on chronic systolic CHF (congestive heart failure), NYHA class 4 (HCC) -Recent 2D echo on 01/17/2024 showed EF of 20 to 25%, G1 DD, no significant valvular disease -Patient was placed on Lasix 60 mg every 12 hours and received extra dose of Lasix 40 mg IV x 1 on 3/30   -Cardiology following -Continue GDMT regimen with Coreg, hydralazine, isosorbide, SGLT2 inhibitor -Creatinine 2.99 today, hold IV Lasix   Hyperactive airway disease/acute wheezing -Noticed diffuse bilateral wheezing -Placed on scheduled DuoNebs, Pulmicort, Brovana, flutter valve -Solu-Medrol 125 mg IV x 1, then start Solu-Medrol 60 mg every 12 hours -Placed on Augmentin -Check BNP - patient needs PFTs once acute phase is resolved and pulmonology  outpatient referral for follow-up   Paroxysmal A-fib (HCC) -Continue Coreg for rate control -Continue eliquis  Nonsustained V. Tach -Continue Coreg, seen by cardiology.  Apparently had a brief episode of NSVT in the ED -Per Dr. Jimmey Ralph, did bedside device interrogation and lowered her VT monitor zone to 140 bpm no arrhythmias on telemetry in the interim.  -Continue apixaban      Acute on CKD (chronic kidney disease), stage IV (HCC) Baseline renal function 2.5-2.9.  Creatinine was 2.5 on 01/26/2024  -Creatinine worsened today likely due to IV Lasix -Hold Lasix today   Hyperlipidemia Continue statin   Essential hypertension Continue hydralazine, isosorbide, Coreg      History of CVA (cerebrovascular accident) -Continue blood pressure control, statin therapy -Continue apixaban   Estimated body mass index is 25.7 kg/m as calculated from the following:   Height as of this encounter: 5\' 7"  (1.702 m).   Weight as of this encounter: 74.4 kg.  Code Status: Full code DVT Prophylaxis:  SCDs Start: 01/31/24 1319 apixaban (ELIQUIS) tablet 5 mg   Level of Care: Level of care: Telemetry Cardiac Family Communication: Updated patient Disposition Plan:      Remains  inpatient appropriate:      Procedures:  Device interrogation  Consultants:   Cardiology  Antimicrobials:   Anti-infectives (From admission, onward)    Start     Dose/Rate Route Frequency Ordered Stop   02/02/24 1000  amoxicillin-clavulanate (AUGMENTIN) 500-125 MG per tablet 1 tablet        1 tablet Oral Every 12 hours 02/02/24 0849            Medications  amoxicillin-clavulanate  1 tablet Oral Q12H   apixaban  5 mg Oral BID   arformoterol  15 mcg Nebulization BID   atorvastatin  80 mg Oral Daily   budesonide (PULMICORT) nebulizer solution  0.25 mg Nebulization BID   carvedilol  25 mg Oral BID WC   empagliflozin  10 mg Oral QAC breakfast   fluticasone  2 spray Each Nare BID   hydrALAZINE  37.5 mg Oral  TID   ipratropium-albuterol  3 mL Nebulization Q6H   isosorbide mononitrate  30 mg Oral Daily   methylPREDNISolone (SOLU-MEDROL) injection  60 mg Intravenous Q12H   oxymetazoline  1 spray Each Nare BID      Subjective:   [name] was seen and examined today.  Diffuse wheezing bilaterally, no fevers, chills, cough.  No nausea vomiting or chest pain, abdominal pain.  Objective:   Vitals:   02/02/24 0800 02/02/24 0804 02/02/24 0825 02/02/24 0902  BP: (!) 131/101 (!) 131/101    Pulse: 91  87   Resp:  16 18   Temp: 98.9 F (37.2 C)     TempSrc: Oral     SpO2: 94%   94%  Weight:      Height:        Intake/Output Summary (Last 24 hours) at 02/02/2024 0951 Last data filed at 02/02/2024 0802 Gross per 24 hour  Intake 840 ml  Output 1400 ml  Net -560 ml     Wt Readings from Last 3 Encounters:  02/02/24 74.4 kg  01/26/24 78.2 kg  01/20/24 77.1 kg    Physical Exam General: Alert and oriented x 3, NAD Cardiovascular: S1 S2 clear, RRR.  Respiratory: Bilaterally diffuse expiratory wheezing Gastrointestinal: Soft, nontender, nondistended, NBS Ext: no pedal edema bilaterally Neuro: no new deficits Psych: Normal affect    Data Reviewed:  I have personally reviewed following labs    CBC Lab Results  Component Value Date   WBC 9.1 02/02/2024   RBC 3.81 (L) 02/02/2024   HGB 11.6 (L) 02/02/2024   HCT 35.7 (L) 02/02/2024   MCV 93.7 02/02/2024   MCH 30.4 02/02/2024   PLT 252 02/02/2024   MCHC 32.5 02/02/2024   RDW 13.7 02/02/2024   LYMPHSABS 1.0 01/31/2024   MONOABS 0.6 01/31/2024   EOSABS 1.0 (H) 01/31/2024   BASOSABS 0.1 01/31/2024     Last metabolic panel Lab Results  Component Value Date   NA 142 02/02/2024   K 3.7 02/02/2024   CL 107 02/02/2024   CO2 23 02/02/2024   BUN 48 (H) 02/02/2024   CREATININE 2.99 (H) 02/02/2024   GLUCOSE 113 (H) 02/02/2024   GFRNONAA 17 (L) 02/02/2024   GFRAA 34 (L) 01/30/2015   CALCIUM 9.2 02/02/2024   PHOS 3.7 02/02/2024    PROT 6.8 01/31/2024   ALBUMIN 3.3 (L) 02/02/2024   BILITOT 0.6 01/31/2024   ALKPHOS 73 01/31/2024   AST 18 01/31/2024   ALT 13 01/31/2024   ANIONGAP 12 02/02/2024    CBG (last 3)  No results for input(s): "GLUCAP" in  the last 72 hours.    Coagulation Profile: No results for input(s): "INR", "PROTIME" in the last 168 hours.   Radiology Studies: I have personally reviewed the imaging studies  No results found.      Thad Ranger M.D. [facility] 02/02/2024, 9:51 AM  Available via Epic secure chat 7am-7pm After 7 pm, please refer to night coverage provider listed on amion.

## 2024-02-02 NOTE — Progress Notes (Signed)
 Mobility Specialist Progress Note:   02/02/24 1105  Mobility  Activity Ambulated independently in hallway  Level of Assistance Modified independent, requires aide device or extra time  Assistive Device None  Distance Ambulated (ft) 300 ft  Activity Response Tolerated well  Mobility Referral Yes  Mobility visit 1 Mobility  Mobility Specialist Start Time (ACUTE ONLY) 1105  Mobility Specialist Stop Time (ACUTE ONLY) 1116  Mobility Specialist Time Calculation (min) (ACUTE ONLY) 11 min   Pt agreeable to mobility session. Required no physical assistance. Continues to ambulate with slight limp d/t plantar fascitis pain. SpO2 dropped to 88% on RA, however denies SOB. Back in bed with all needs met.  Addison Lank Mobility Specialist Please contact via SecureChat or  Rehab office at [phone]

## 2024-02-02 NOTE — Consult Note (Signed)
 [facility] Liaison Note  02/02/2024  [name] 05/19/1961 [id number]  Location: 10:20 am [facility] Liaison met patient at bedside at [facility].  Discussed in morning progression meeting as well. Patient endorses PCP and no current follow up needs.  SDOH intact.  Insurance: [employer]   [name] is a 63 y.o. female who is a Primary Care Patient of Alysia Penna, MD with [facility] which is listed for post hospital transition of care follow up.  The patient was screened for 30 day readmission hospitalization with noted medium risk score for unplanned readmission risk with 2 admissions in 6 months.  The patient was assessed for potential Care Management service needs for post hospital transition for care coordination. Review of patient's electronic medical record reveals patient is admitted with severe dyspnea with acute on chronic systolic HF exacerbation per MD History and Physical.  Plan: [facility] Liaison will continue to follow progress and disposition to asess for post hospital community care coordination/management needs.  Referral request for community care coordination: Anticipate follow up with the [facility] Transition Team.   [facility] Management/Population Health does not replace or interfere with any arrangements made by the Inpatient Transition of Care team.   For questions contact:   Charlesetta Shanks, RN, BSN, CCM   [facility], [facility] Liaison Direct Dial: [phone] or secure chat Email: [email or web address]

## 2024-02-02 NOTE — Progress Notes (Addendum)
   Patient Name: KYREN VAUX Date of Encounter: 02/02/2024 [facility] Cardiologist: Rollene Rotunda, MD   Interval Summary  .    Reports she is feeling better this morning, breathing has improved. Says she walked with therapy today and felt well  Vital Signs .    Vitals:   02/02/24 0800 02/02/24 0804 02/02/24 0825 02/02/24 0902  BP: (!) 131/101 (!) 131/101    Pulse: 91  87   Resp:  16 18   Temp: 98.9 F (37.2 C)     TempSrc: Oral     SpO2: 94%   94%  Weight:      Height:        Intake/Output Summary (Last 24 hours) at 02/02/2024 0937 Last data filed at 02/02/2024 0802 Gross per 24 hour  Intake 840 ml  Output 1400 ml  Net -560 ml      02/02/2024    2:47 AM 02/01/2024    4:38 AM 01/31/2024    2:18 PM  Last 3 Weights  Weight (lbs) 164 lb 1.6 oz 166 lb 3.6 oz 169 lb 15.6 oz  Weight (kg) 74.435 kg 75.4 kg 77.1 kg      Telemetry/ECG    BiVpaced - Personally Reviewed  Physical Exam .    GEN: No acute distress.   Neck: No JVD Cardiac: RRR, no murmurs, rubs, or gallops.  Respiratory: Minimal wheezing noted  GI: Soft, nontender, non-distended  MS: No edema  Assessment & Plan .     63 y.o. female with a hx of nonischemic cardiomyopathy, hypertension, s/p BiV ICD, stage IIIb CKD, paroxysmal atrial fibrillation on Eliquis, acute left CVA in 2023 who is being seen 01/31/2024 for the evaluation of NSVT at the request of Dr. Madilyn Hook.   Acute on Chronic HFrEF S/p BiV ICD -- known LVEF of 20-25%, cMRI 2023 without evidence of prior MI or amyloid. Underwent BiV ICD placement 01/2023 -- presented with dyspnea and cough. OptiVol on device interrogation on admission slightly elevated but still appropriate per reports -- has been diuresing with IV lasix, weight down 169>>164lbs. IV lasix stopped with plans to potentially transition to oral dosing today, will hold with rise in Cr. She does not appear volume overloaded on exam today. -- GDMT: on coreg, hydralazine, Imdur,  jardiance  Paroxysmal atrial fibrillation NSVT -- on device interrogation noted to have atrial tachycardia with 1:1 conduction and episodes of NSVT -- VT monitor zone was lower to 140 bpm per Dr. Jimmey Ralph yesterday -- on Eliquis 5mg  BID   CKD stage IIIb -- baseline Cr 2.2-2.6, up to 2.9 this morning -- as above, hold additional lasix at this time -- follow BMET  HTN -- continue coreg 25mg  BID, hydralazine 37.5mg  TID, Imdur 30mg  daily  HLD -- on statin  For questions or updates, please contact [facility] Please consult [email or web address] for contact info under        Signed, Laverda Page, NP

## 2024-02-02 NOTE — Progress Notes (Signed)
 Pharmacist Heart Failure Core Measure Documentation  Assessment: [name] has an EF documented as 20-25%  on 01/17/2024 by 2d EHCO.  Rationale: Heart failure patients with left ventricular systolic dysfunction (LVSD) and an EF < 40% should be prescribed an angiotensin converting enzyme inhibitor (ACEI) or angiotensin receptor blocker (ARB) at discharge unless a contraindication is documented in the medical record.  This patient is not currently on an ACEI or ARB for HF.  This note is being placed in the record in order to provide documentation that a contraindication to the use of these agents is present for this encounter.  ACE Inhibitor or Angiotensin Receptor Blocker is contraindicated (specify all that apply)  []   ACEI allergy AND ARB allergy []   Angioedema []   Moderate or severe aortic stenosis []   Hyperkalemia []   Hypotension []   Renal artery stenosis [x]   Worsening renal function, preexisting renal disease or dysfunction  (No Ace/ARB /ARNI due to  CKD stage IV)   Noah Delaine, RPh Clinical Pharmacist 02/02/2024 3:21 PM

## 2024-02-03 DIAGNOSIS — I5023 Acute on chronic systolic (congestive) heart failure: Secondary | ICD-10-CM | POA: Diagnosis not present

## 2024-02-03 DIAGNOSIS — I4729 Other ventricular tachycardia: Secondary | ICD-10-CM | POA: Diagnosis not present

## 2024-02-03 DIAGNOSIS — R0902 Hypoxemia: Secondary | ICD-10-CM | POA: Diagnosis not present

## 2024-02-03 DIAGNOSIS — R0602 Shortness of breath: Secondary | ICD-10-CM | POA: Diagnosis not present

## 2024-02-03 DIAGNOSIS — R062 Wheezing: Secondary | ICD-10-CM | POA: Diagnosis not present

## 2024-02-03 DIAGNOSIS — N184 Chronic kidney disease, stage 4 (severe): Secondary | ICD-10-CM | POA: Diagnosis not present

## 2024-02-03 LAB — RENAL FUNCTION PANEL
Albumin: 3.2 g/dL — ABNORMAL LOW (ref 3.5–5.0)
Anion gap: 13 (ref 5–15)
BUN: 61 mg/dL — ABNORMAL HIGH (ref 6–20)
CO2: 23 mmol/L (ref 22–32)
Calcium: 9.4 mg/dL (ref 8.9–10.3)
Chloride: 101 mmol/L (ref 98–111)
Creatinine, Ser: 3.14 mg/dL — ABNORMAL HIGH (ref 0.44–1.00)
GFR, Estimated: 16 mL/min — ABNORMAL LOW (ref >60)
Glucose, Bld: 196 mg/dL — ABNORMAL HIGH (ref 70–99)
Phosphorus: 4.4 mg/dL (ref 2.5–4.6)
Potassium: 4 mmol/L (ref 3.5–5.1)
Sodium: 137 mmol/L (ref 135–145)

## 2024-02-03 LAB — CBC
HCT: 34.2 % — ABNORMAL LOW (ref 36.0–46.0)
Hemoglobin: 11.5 g/dL — ABNORMAL LOW (ref 12.0–15.0)
MCH: 31.2 pg (ref 26.0–34.0)
MCHC: 33.6 g/dL (ref 30.0–36.0)
MCV: 92.7 fL (ref 80.0–100.0)
Platelets: 231 10*3/uL (ref 150–400)
RBC: 3.69 MIL/uL — ABNORMAL LOW (ref 3.87–5.11)
RDW: 13.4 % (ref 11.5–15.5)
WBC: 10.3 10*3/uL (ref 4.0–10.5)
nRBC: 0 % (ref 0.0–0.2)

## 2024-02-03 LAB — MAGNESIUM: Magnesium: 1.8 mg/dL (ref 1.7–2.4)

## 2024-02-03 MED ORDER — EMPAGLIFLOZIN 10 MG PO TABS
10.0000 mg | ORAL_TABLET | Freq: Every day | ORAL | Status: DC
  Administered 2024-02-03: 10 mg via ORAL
  Filled 2024-02-03: qty 1

## 2024-02-03 MED ORDER — METHYLPREDNISOLONE SODIUM SUCC 40 MG IJ SOLR
40.0000 mg | Freq: Two times a day (BID) | INTRAMUSCULAR | Status: DC
  Administered 2024-02-03 – 2024-02-04 (×2): 40 mg via INTRAVENOUS
  Filled 2024-02-03 (×2): qty 1

## 2024-02-03 MED ORDER — TORSEMIDE 20 MG PO TABS
20.0000 mg | ORAL_TABLET | Freq: Every day | ORAL | Status: DC
  Administered 2024-02-03: 20 mg via ORAL
  Filled 2024-02-03 (×2): qty 1

## 2024-02-03 NOTE — TOC Initial Note (Signed)
 Transition of Care [facility]) - Initial/Assessment Note    Patient Details  Name: [name] MRN: [id number] Date of Birth: May 20, 1961  Transition of Care [facility]) CM/SW Contact:    Leone Haven, RN Phone Number: 02/03/2024, 2:24 PM  Clinical Narrative:                 From home with daughter, has PCP and insurance on file, states has no HH services in place at this time or DME at home.  States family member will transport them home at [employer] and family is support system, states gets medications from [place] on [address].  Pta self ambulatory.   Expected Discharge Plan: Home/Self Care Barriers to Discharge: Continued Medical Work up   Patient Goals and CMS Choice Patient states their goals for this hospitalization and ongoing recovery are:: return home   Choice offered to / list presented to : NA      Expected Discharge Plan and Services In-house Referral: NA Discharge Planning Services: CM Consult Post Acute Care Choice: NA Living arrangements for the past 2 months: Single Family Home                 DME Arranged: N/A DME Agency: NA       HH Arranged: NA          Prior Living Arrangements/Services Living arrangements for the past 2 months: Single Family Home Lives with:: Adult Children (daughter and nephew) Patient language and need for interpreter reviewed:: Yes Do you feel safe going back to the place where you live?: Yes      Need for Family Participation in Patient Care: No (Comment) Care giver support system in place?: Yes (comment) Current home services:  (hosp bed, w/chair, walker, and a cane) Criminal Activity/Legal Involvement Pertinent to Current Situation/Hospitalization: No - Comment as needed  Activities of Daily Living   ADL Screening (condition at time of admission) Independently performs ADLs?: Yes (appropriate for developmental age) Is the patient deaf or have difficulty hearing?: No Does the patient have difficulty seeing, even when  wearing glasses/contacts?: No Does the patient have difficulty concentrating, remembering, or making decisions?: No  Permission Sought/Granted Permission sought to share information with : Family Supports, Case Manager Permission granted to share information with : Yes, Verbal Permission Granted  Share Information with NAME: syncoria     Permission granted to share info w Relationship: daughter     Emotional Assessment Appearance:: Appears stated age Attitude/Demeanor/Rapport: Engaged Affect (typically observed): Appropriate Orientation: : Oriented to Self, Oriented to Place, Oriented to  Time, Oriented to Situation Alcohol / Substance Use: Not Applicable Psych Involvement: No (comment)  Admission diagnosis:  Wheezing [R06.2] SOB (shortness of breath) [R06.02] Hypoxia [R09.02] Heart failure (HCC) [I50.9] Patient Active Problem List   Diagnosis Date Noted   NSVT (nonsustained ventricular tachycardia) (HCC) 02/03/2024   Heart failure (HCC) 01/31/2024   Hyperactive airway disease 01/31/2024   Acute combined systolic and diastolic congestive heart failure (HCC) 01/16/2024   CKD (chronic kidney disease), stage IV (HCC) 01/16/2024   History of CVA (cerebrovascular accident) 01/16/2024   Metabolic acidosis 01/16/2024   Acute exacerbation of CHF (congestive heart failure) (HCC) 01/16/2024   URI (upper respiratory infection) 01/16/2024   Paroxysmal A-fib (HCC) 02/04/2023   Hypercoagulable state due to paroxysmal atrial fibrillation (HCC) 02/04/2023   Acute ischemic stroke (HCC) 12/13/2021   Hyperlipidemia 02/05/2019   Advice given about COVID-19 virus infection 02/05/2019   CKD (chronic kidney disease), stage III (HCC) 02/03/2018  Insomnia 10/14/2014   At risk for sudden cardiac death 09/08/14   Chronic systolic HF (heart failure) (HCC) 09-08-2014   Essential hypertension, malignant 08/08/2014   Non compliance with medical treatment 08/07/2014   Normal coronary arteries 2006  08/07/2014   Renal insufficiency- unclear if chronic or not 08/07/2014   Acute on chronic systolic CHF (congestive heart failure), NYHA class 4 (HCC) 08/06/2014   DYSPNEA 07/04/2010   Chest pain- MI r/o 05/24/2009   Obesity (BMI 30-39.9) 02/13/2009   Essential hypertension 02/13/2009   NICM- EF 25-30% echo 08/05/14 02/13/2009   PCP:  Alysia Penna, MD Pharmacy:   [facility] [phone] - [place], [place] - [address] AT [facility] [address] [place]  [id number] Phone: [phone] Fax: [phone]     Social Drivers of Health (SDOH) Social History: SDOH Screenings   Food Insecurity: No Food Insecurity (01/31/2024)  Housing: Low Risk  (01/31/2024)  Transportation Needs: No Transportation Needs (01/31/2024)  Utilities: Not At Risk (01/31/2024)  Alcohol Screen: Low Risk  (01/19/2024)  Financial Resource Strain: Low Risk  (01/19/2024)  Tobacco Use: Low Risk  (01/31/2024)   SDOH Interventions:     Readmission Risk Interventions    02/03/2024    2:18 PM 02/02/2024    4:48 PM 01/19/2024    4:08 PM  Readmission Risk Prevention Plan  Medication Screening   Complete  Transportation Screening Complete Complete Complete  PCP or Specialist Appt within 5-7 Days Complete    Home Care Screening Complete Complete   Medication Review (RN CM) Complete Complete

## 2024-02-03 NOTE — Plan of Care (Signed)
  Problem: Clinical Measurements: Goal: Respiratory complications will improve Outcome: Progressing   Problem: Activity: Goal: Risk for activity intolerance will decrease Outcome: Progressing   Problem: Coping: Goal: Level of anxiety will decrease Outcome: Progressing   Problem: Safety: Goal: Ability to remain free from injury will improve Outcome: Progressing   

## 2024-02-03 NOTE — Progress Notes (Signed)
 Heart Failure Nurse Navigator Progress Note  PCP: Alysia Penna, MD PCP-Cardiologist: [place] Admission Diagnosis: None Admitted from: Home via EMS  Presentation:   [name] presented as a readmission from 3/14- 3/18//2025 (for URI with hyperreactive airway disease, and acute on chronic systolic heart failure exacerbation. ) with sudden shortness of breath while using the restroom. She used her inhaler with no relief, called EMS, BP 160/96, HR 91, O2 saturations 83% on RA. Placed on a NRB, also with mild lower extremity edema, which per patient is her normal. BNP 459.6,   Patient and Nephew were re-educated on the sign and symptoms of heart failure, daily weights, when to call her doctor or go to the ED, Diet/ fluid restrictions ( patient reported she went from drinking 2 beers nightly to 1 1/2 beers nightly) continued education on taking all her medication as prescribed and attending all medical appointments. Patient and Nephew verbalized their understanding of education. A HF TOC follow up was scheduled for 02/16/2024 @ 2 pm. ( Time frame was approved by Jacqualine Mau )   ECHO/ LVEF: 20-25%  Clinical Course:  Past Medical History:  Diagnosis Date   CHF (congestive heart failure) (HCC)    Hypertension    Overweight(278.02)    Sciatica    Secondary cardiomyopathy, unspecified    EF now 50%     Social History   Socioeconomic History   Marital status: Single    Spouse name: Not on file   Number of children: 3   Years of education: Not on file   Highest education level: High school graduate  Occupational History   Not on file  Tobacco Use   Smoking status: Never   Smokeless tobacco: Never   Tobacco comments:    Never smoke 03/07/23  Vaping Use   Vaping status: Never Used  Substance and Sexual Activity   Alcohol use: Yes    Alcohol/week: 7.0 - 14.0 standard drinks of alcohol    Types: 7 - 14 Cans of beer per week    Comment: 1-2 beers daily 03/07/23   Drug use: No    Sexual activity: Yes    Birth control/protection: Surgical  Other Topics Concern   Not on file  Social History Narrative   Not on file   Social Drivers of Health   Financial Resource Strain: Low Risk  (01/19/2024)   Overall Financial Resource Strain (CARDIA)    Difficulty of Paying Living Expenses: Not hard at all  Food Insecurity: No Food Insecurity (01/31/2024)   Hunger Vital Sign    Worried About Running Out of Food in the Last Year: Never true    Ran Out of Food in the Last Year: Never true  Transportation Needs: No Transportation Needs (01/31/2024)   PRAPARE - Administrator, Civil Service (Medical): No    Lack of Transportation (Non-Medical): No  Physical Activity: Not on file  Stress: Not on file  Social Connections: Not on file   Education Assessment and Provision:  Detailed education and instructions provided on heart failure disease management including the following:  Signs and symptoms of Heart Failure When to call the physician Importance of daily weights Low sodium diet Fluid restriction Medication management Anticipated future follow-up appointments  Patient education given on each of the above topics.  Patient acknowledges understanding via teach back method and acceptance of all instructions.  Education Materials:  "Living Better With Heart Failure" Booklet, HF zone tool, & Daily Weight Tracker Tool.  Patient has  scale at home: Yes Patient has pill box at home: Yes    High Risk Criteria for Readmission and/or Poor Patient Outcomes: Heart failure hospital admissions (last 6 months): 0  No Show rate: 12 % Difficult social situation: No, Lives with her Nephew Demonstrates medication adherence: Yes Primary Language: English  Literacy level: Reading, writing, and comprehension  Barriers of Care:   Diet/ fluid restrictions ( beer nightly) Daily weights  Considerations/Referrals:   Referral made to Heart Failure Pharmacist Stewardship:  yes Referral made to Heart Failure CSW/NCM TOC: No Referral made to Heart & Vascular TOC clinic: Yes, 02/10/2024 @ 2 pm, Last seen in HF TOC 01/26/2024  Items for Follow-up on DC/TOC: Continued HF education Diet/ fluid restrictions Daily weights   Rhae Hammock, BSN, RN Heart Failure Teacher, adult education Only

## 2024-02-03 NOTE — Progress Notes (Addendum)
   Patient Name: [name] Date of Encounter: 02/03/2024 [facility] Cardiologist: Rollene Rotunda, MD   Interval Summary  .    Feels well. Denies worsening shortness of breath, orthopnea, chest pain, palpitations, worsening edema.  Vital Signs .    Vitals:   02/02/24 1955 02/03/24 0022 02/03/24 0251 02/03/24 0451  BP:  129/82  (!) 130/96  Pulse:  83  82  Resp:  18    Temp:  98.5 F (36.9 C)    TempSrc:      SpO2: 98% 90% 92% 95%  Weight:      Height:        Intake/Output Summary (Last 24 hours) at 02/03/2024 0850 Last data filed at 02/03/2024 0842 Gross per 24 hour  Intake 720 ml  Output 600 ml  Net 120 ml      02/02/2024    2:47 AM 02/01/2024    4:38 AM 01/31/2024    2:18 PM  Last 3 Weights  Weight (lbs) 164 lb 1.6 oz 166 lb 3.6 oz 169 lb 15.6 oz  Weight (kg) 74.435 kg 75.4 kg 77.1 kg      Telemetry/ECG     Ventricular paced rhythm with rates in the 80's and 90's. Frequent PVC's (7652/40794) 18% - Personally Reviewed  Physical Exam .   GEN: No acute distress.   Neck: No JVD Cardiac: RRR, no murmurs, rubs, or gallops.  Respiratory: Clear to auscultation bilaterally. MS: 1+ edema  Assessment & Plan .    [name] is a 63 y.o. female with a hx of nonischemic cardiomyopathy, hypertension, s/p BiV ICD, stage IIIb CKD, paroxysmal atrial fibrillation on Eliquis, acute left CVA in 2023 who is being seen 01/31/2024 for the evaluation of NSVT at the request of Dr. Madilyn Hook.   Acute on Chronic HFrEF S/p BiV ICD -- on 01/17/24 known LVEF of 20-25%, cMRI 2023 without evidence of prior MI or amyloid. Underwent BiV ICD placement 01/2023 -- presented with dyspnea and cough. OptiVol on device interrogation on admission slightly elevated but still appropriate per reports -- Was diuresed on 60 mg IV lasix. This was stopped yesterday and last dose was received at 0350 on 02/02/24. Weight down about 5 lbs since admission. BNP is trending down. Appears euvolemic on exam. I/O are  net down 0.320 L and don't appear to be accurate. Cr has continued to trend up slightly at 3.14 today from 2.9 yesterday and a baseline of 2.2-2.6. -- Suspect increase in creatinine is from IV diuresis and will likely improve tomorrow -- Start torsemide 20 mg daily  -- GDMT: Continue coreg, hydralazine, Imdur, Jardiance.   Paroxysmal atrial fibrillation NSVT -- on device interrogation noted to have atrial tachycardia with 1:1 conduction and episodes of NSVT -- VT monitor zone was lower to 140 bpm per Dr. Jimmey Ralph on 01/31/34 -- Ordered magnesium -- potassium 4.0 today. -- Continue Eliquis 5mg  BID    AKI on CKD stage IIIb -- baseline Cr 2.2-2.6, up to 3.14 this morning -- Suspect increase in creatinine is from IV diuresis and will likely improve tomorrow -- as above, stop lasix as above and start torsemide. -- follow BMET   HTN -- continue coreg 25mg  BID, hydralazine 37.5mg  TID, Imdur 30mg  daily   HLD -- continue statin  For questions or updates, please contact  [facility] Please consult [email or web address] for contact info under        Signed, Arabella Merles, PA-C

## 2024-02-03 NOTE — Progress Notes (Signed)
 Mobility Specialist Progress Note:   02/03/24 0930  Mobility  Activity Ambulated independently in hallway  Level of Assistance Modified independent, requires aide device or extra time  Assistive Device None  Distance Ambulated (ft) 300 ft  Activity Response Tolerated well  Mobility Referral Yes  Mobility visit 1 Mobility  Mobility Specialist Start Time (ACUTE ONLY) 0930  Mobility Specialist Stop Time (ACUTE ONLY) 0940  Mobility Specialist Time Calculation (min) (ACUTE ONLY) 10 min   Pt agreeable to mobility session. Required no physical assistance throughout ambulation. VSS on RA. Back in bed with all needs met.   Addison Lank Mobility Specialist Please contact via SecureChat or  Rehab office at [phone]

## 2024-02-03 NOTE — Progress Notes (Signed)
 [facility]                                                                              [name], is a 63 y.o. female, DOB - 1961/09/05, ZOX:[id number] Admit date - 01/31/2024    Outpatient Primary MD for the patient is [name] Penna, MD  LOS - 3  days  Chief Complaint  Patient presents with   Shortness of Breath       Brief summary   Patient is a 63 year old female with CHF, hypertension, sciatica, presented with shortness of breath.  She was recently hospitalized 03/14 to 01/20/24 for URI with hyperreactive airway disease, and acute on chronic systolic heart failure exacerbation. She was treated with systemic steroids and IV furosemide. Instructed to continue prednisone 20 mg until 03/19.  She was doing well at home until a night before the admission, when she noticed recommended dyspnea, worse on exertion.  No acute wheezing or cough.  She used an inhaler with no improvement in her symptoms.  Because of persistent and severe dyspnea, called EMS, O2 sats were 83% on room air, received bronchodilator therapy. Patient with sinus congestion and postnasal drip, not confusion improved with nasal spray.  Assessment & Plan    Principal Problem:   Acute on chronic systolic CHF (congestive heart failure), NYHA class 4 (HCC) -Recent 2D echo on 01/17/2024 showed EF of 20 to 25%, G1 DD, no significant valvular disease -Patient was placed on Lasix 60 mg every 12 hours and received extra dose of Lasix 40 mg IV x 1 on 3/30   -Cardiology following -Continue GDMT regimen with Coreg, hydralazine, isosorbide, SGLT2 inhibitor -Creatinine trended up to 3.1 today likely due to IV diuresis -Wheezing improved today IV Lasix discontinued and patient now transitioned to oral torsemide   Hyperactive airway disease/acute wheezing - Continue DuoNebs, Pulmicort, Brovana, flutter valve -Will taper IV Solu-Medrol today and likely oral prednisone with a taper tomorrow  -Continue  Augmentin - patient needs PFTs once acute phase is resolved and pulmonology ambulatory outpatient referral for follow-up at discharge   Paroxysmal A-fib (HCC) -Continue Coreg for rate control -Continue eliquis  Nonsustained V. Tach -Continue Coreg, seen by cardiology.  Apparently had a brief episode of NSVT in the ED -Per Dr. Jimmey [name], did bedside device interrogation and lowered her VT monitor zone to 140 bpm no arrhythmias on telemetry in the interim.  -Continue eliquis, patient has ICD     Acute on CKD (chronic kidney disease), stage IV (HCC) Baseline renal function 2.5-2.9.  Creatinine was 2.5 on 01/26/2024  -Creatinine worsened to 3.14 today likely due to IV diuresis -IV Lasix discontinued  Hyperlipidemia Continue statin   Essential hypertension Continue hydralazine, isosorbide, Coreg      History of CVA (cerebrovascular accident) -Continue blood pressure control, statin therapy -Continue apixaban   Estimated body mass index is 25.7 kg/m as calculated from the following:   Height as of this encounter: 5\' 7"  (1.702 m).   Weight as of this encounter: 74.4 kg.  Code Status: Full code DVT Prophylaxis:  SCDs Start: 01/31/24 1319 apixaban (ELIQUIS) tablet 5 mg   Level of Care: Level  of care: Telemetry Cardiac Family Communication: Updated patient Disposition Plan:      Remains inpatient appropriate:   Likely DC home in a.m. if creatinine and wheezing continues to improve   Procedures:  Device interrogation  Consultants:   Cardiology  Antimicrobials:   Anti-infectives (From admission, onward)    Start     Dose/Rate Route Frequency Ordered Stop   02/02/24 1000  amoxicillin-clavulanate (AUGMENTIN) 500-125 MG per tablet 1 tablet        1 tablet Oral Every 12 hours 02/02/24 0849            Medications  amoxicillin-clavulanate  1 tablet Oral Q12H   apixaban  5 mg Oral BID   arformoterol  15 mcg Nebulization BID   atorvastatin  80 mg Oral Daily   budesonide  (PULMICORT) nebulizer solution  0.25 mg Nebulization BID   carvedilol  25 mg Oral BID WC   empagliflozin  10 mg Oral Daily   fluticasone  2 spray Each Nare BID   hydrALAZINE  37.5 mg Oral TID   isosorbide mononitrate  30 mg Oral Daily   methylPREDNISolone (SOLU-MEDROL) injection  60 mg Intravenous Q12H   torsemide  20 mg Oral Daily      Subjective:   [name] was seen and examined today.  Wheezing better today, creatinine worsened to 3.1.  No fever chills, chest pain.  Shortness of breath is improving.    Objective:   Vitals:   02/03/24 0451 02/03/24 0916 02/03/24 0917 02/03/24 1059  BP: (!) 130/96   117/71  Pulse: 82     Resp:    16  Temp:    97.7 F (36.5 C)  TempSrc:    Oral  SpO2: 95% 96% 96% 98%  Weight:      Height:        Intake/Output Summary (Last 24 hours) at 02/03/2024 1219 Last data filed at 02/03/2024 0842 Gross per 24 hour  Intake 720 ml  Output 500 ml  Net 220 ml     Wt Readings from Last 3 Encounters:  02/02/24 74.4 kg  01/26/24 78.2 kg  01/20/24 77.1 kg   Physical Exam General: Alert and oriented x 3, NAD Cardiovascular: S1 S2 clear, RRR.  Respiratory: Bilateral expiratory wheezing improving Gastrointestinal: Soft, nontender, nondistended, NBS Ext: 1+  pedal edema bilaterally Neuro: no new deficits Psych: Normal affect    Data Reviewed:  I have personally reviewed following labs    CBC Lab Results  Component Value Date   WBC 10.3 02/03/2024   RBC 3.69 (L) 02/03/2024   HGB 11.5 (L) 02/03/2024   HCT 34.2 (L) 02/03/2024   MCV 92.7 02/03/2024   MCH 31.2 02/03/2024   PLT 231 02/03/2024   MCHC 33.6 02/03/2024   RDW 13.4 02/03/2024   LYMPHSABS 1.0 01/31/2024   MONOABS 0.6 01/31/2024   EOSABS 1.0 (H) 01/31/2024   BASOSABS 0.1 01/31/2024     Last metabolic panel Lab Results  Component Value Date   NA 137 02/03/2024   K 4.0 02/03/2024   CL 101 02/03/2024   CO2 23 02/03/2024   BUN 61 (H) 02/03/2024   CREATININE 3.14 (H)  02/03/2024   GLUCOSE 196 (H) 02/03/2024   GFRNONAA 16 (L) 02/03/2024   GFRAA 34 (L) 01/30/2015   CALCIUM 9.4 02/03/2024   PHOS 4.4 02/03/2024   PROT 6.8 01/31/2024   ALBUMIN 3.2 (L) 02/03/2024   BILITOT 0.6 01/31/2024   ALKPHOS 73 01/31/2024   AST 18 01/31/2024  ALT 13 01/31/2024   ANIONGAP 13 02/03/2024    CBG (last 3)  No results for input(s): "GLUCAP" in the last 72 hours.    Coagulation Profile: No results for input(s): "INR", "PROTIME" in the last 168 hours.   Radiology Studies: I have personally reviewed the imaging studies  No results found.      Thad Ranger M.D. [facility] 02/03/2024, 12:19 PM  Available via Epic secure chat 7am-7pm After 7 pm, please refer to night coverage provider listed on amion.

## 2024-02-03 NOTE — Plan of Care (Signed)
   Problem: Clinical Measurements: Goal: Respiratory complications will improve Outcome: Progressing   Problem: Activity: Goal: Risk for activity intolerance will decrease Outcome: Progressing

## 2024-02-03 NOTE — Plan of Care (Signed)
   Problem: Education: Goal: Knowledge of General Education information will improve Description Including pain rating scale, medication(s)/side effects and non-pharmacologic comfort measures Outcome: Progressing

## 2024-02-04 DIAGNOSIS — I5023 Acute on chronic systolic (congestive) heart failure: Secondary | ICD-10-CM | POA: Diagnosis not present

## 2024-02-04 DIAGNOSIS — I480 Paroxysmal atrial fibrillation: Secondary | ICD-10-CM

## 2024-02-04 DIAGNOSIS — J45909 Unspecified asthma, uncomplicated: Secondary | ICD-10-CM | POA: Diagnosis not present

## 2024-02-04 DIAGNOSIS — I4729 Other ventricular tachycardia: Secondary | ICD-10-CM

## 2024-02-04 DIAGNOSIS — N170 Acute kidney failure with tubular necrosis: Secondary | ICD-10-CM | POA: Diagnosis not present

## 2024-02-04 DIAGNOSIS — N184 Chronic kidney disease, stage 4 (severe): Secondary | ICD-10-CM | POA: Diagnosis not present

## 2024-02-04 LAB — RENAL FUNCTION PANEL
Albumin: 3 g/dL — ABNORMAL LOW (ref 3.5–5.0)
Anion gap: 12 (ref 5–15)
BUN: 73 mg/dL — ABNORMAL HIGH (ref 6–20)
CO2: 22 mmol/L (ref 22–32)
Calcium: 8.7 mg/dL — ABNORMAL LOW (ref 8.9–10.3)
Chloride: 99 mmol/L (ref 98–111)
Creatinine, Ser: 3.25 mg/dL — ABNORMAL HIGH (ref 0.44–1.00)
GFR, Estimated: 16 mL/min — ABNORMAL LOW (ref >60)
Glucose, Bld: 159 mg/dL — ABNORMAL HIGH (ref 70–99)
Phosphorus: 4.3 mg/dL (ref 2.5–4.6)
Potassium: 3.5 mmol/L (ref 3.5–5.1)
Sodium: 133 mmol/L — ABNORMAL LOW (ref 135–145)

## 2024-02-04 LAB — CBC
HCT: 33.1 % — ABNORMAL LOW (ref 36.0–46.0)
Hemoglobin: 11.1 g/dL — ABNORMAL LOW (ref 12.0–15.0)
MCH: 30.8 pg (ref 26.0–34.0)
MCHC: 33.5 g/dL (ref 30.0–36.0)
MCV: 91.9 fL (ref 80.0–100.0)
Platelets: 218 10*3/uL (ref 150–400)
RBC: 3.6 MIL/uL — ABNORMAL LOW (ref 3.87–5.11)
RDW: 13.3 % (ref 11.5–15.5)
WBC: 19.5 10*3/uL — ABNORMAL HIGH (ref 4.0–10.5)
nRBC: 0 % (ref 0.0–0.2)

## 2024-02-04 MED ORDER — HYDRALAZINE HCL 50 MG PO TABS
50.0000 mg | ORAL_TABLET | Freq: Three times a day (TID) | ORAL | Status: DC
  Administered 2024-02-04: 50 mg via ORAL
  Filled 2024-02-04 (×2): qty 1

## 2024-02-04 MED ORDER — ISOSORBIDE DINITRATE 10 MG PO TABS
30.0000 mg | ORAL_TABLET | Freq: Three times a day (TID) | ORAL | Status: DC
  Administered 2024-02-04 (×2): 30 mg via ORAL
  Filled 2024-02-04 (×2): qty 3

## 2024-02-04 MED ORDER — EMPAGLIFLOZIN 10 MG PO TABS
10.0000 mg | ORAL_TABLET | Freq: Every day | ORAL | Status: DC
  Administered 2024-02-04 – 2024-02-06 (×3): 10 mg via ORAL
  Filled 2024-02-04 (×3): qty 1

## 2024-02-04 MED ORDER — MAGNESIUM SULFATE 2 GM/50ML IV SOLN
2.0000 g | Freq: Once | INTRAVENOUS | Status: AC
  Administered 2024-02-04: 2 g via INTRAVENOUS
  Filled 2024-02-04: qty 50

## 2024-02-04 NOTE — Progress Notes (Signed)
 Patient Name: [name] Date of Encounter: 02/04/2024 [facility] Cardiologist: Rollene Rotunda, MD   Interval Summary  .    Denies shortness of breath, chest pain, orthopnea, PND, lower extremity edema, and diaphoresis  Vital Signs .    Vitals:   02/03/24 2032 02/04/24 0034 02/04/24 0432 02/04/24 0725  BP:  112/83 (!) 134/97 130/81  Pulse:  84 84 87  Resp:  (P) 18 (P) 18 18  Temp:  (P) 98.1 F (36.7 C) (P) 98 F (36.7 C) 97.8 F (36.6 C)  TempSrc:  (P) Oral (P) Oral Oral  SpO2: 95% 93% 97% 100%  Weight:   (P) 75.6 kg   Height:        Intake/Output Summary (Last 24 hours) at 02/04/2024 0839 Last data filed at 02/04/2024 0400 Gross per 24 hour  Intake 717 ml  Output 1450 ml  Net -733 ml      02/04/2024    4:32 AM 02/02/2024    2:47 AM 02/01/2024    4:38 AM  Last 3 Weights  Weight (lbs) 166 lb 9.6 oz 164 lb 1.6 oz 166 lb 3.6 oz  Weight (kg) 75.569 kg 74.435 kg 75.4 kg      Telemetry/ECG    Ventricular paced rhythm with a PVC typically occurring every 5th beat. Had 3 episodes of VT on 02/03/24 between 3 and 9 pm  the longest lasted 11 beats- Personally Reviewed  Physical Exam .   GEN: No acute distress.   Neck: No JVD Cardiac: RRR, no murmurs, rubs, or gallops.  Respiratory: Clear to auscultation bilaterally. GI: Soft, nontender, non-distended  MS: 1+ edema  Assessment & Plan .     [name] is a 63 y.o. female with a hx of nonischemic cardiomyopathy, hypertension, s/p BiV ICD, stage IIIb CKD, paroxysmal atrial fibrillation on Eliquis, acute left CVA in 2023 who is being seen 01/31/2024 for the evaluation of NSVT at the request of Dr. Madilyn Hook.    Acute on Chronic HFrEF S/p BiV ICD -- on 01/17/24 known LVEF of 20-25%, cMRI 2023 without evidence of prior MI or amyloid. Underwent BiV ICD placement 01/2023 -- presented with dyspnea and cough. OptiVol on device interrogation on admission slightly elevated but still appropriate per reports -- Was diuresed on 60 mg  IV lasix. This was stopped and last dose was received at 0350 on 02/02/24. Weight down about 4 lbs since admission. BNP is trending down. Appears euvolemic on exam. I/O are net down 0.733 over the past day. Cr has continued to trend up slightly at 3.25 today from 2.08 several days ago and a baseline of 2.2-2.6. -- stop torsemide 20 mg daily due to worsening renal function and patient appearing euvolemic on exam.    -- GDMT: Continue coreg, hydralazine, Imdur. Held jardiance for now for worsening renal function.   Paroxysmal atrial fibrillation NSVT -- on device interrogation noted to have atrial tachycardia with 1:1 conduction and episodes of NSVT -- VT monitor zone was lower to 140 bpm per Dr. Jimmey Ralph on 01/31/34 --  magnesium 1.8 on 4/1, potassium 3.5 today. Torsemide held. goal magnesium 2.0 and potassium 4.0. -- Order IV magnesium sulfate -- Continue Eliquis 5mg  BID    AKI on CKD stage IIIb -- baseline Cr 2.2-2.6, creatinine continues to trend up (is 3.25 today). -- as above, IV lasix was stopped. Will stop torsemide. -- follow BMET   HTN -- continue coreg 25mg  BID, hydralazine 37.5mg  TID, Imdur 30mg  daily  HLD -- continue statin  Normocytic anemia   For questions or updates, please contact  [facility] Please consult [email or web address] for contact info under        Signed, Arabella Merles, PA-C

## 2024-02-04 NOTE — Progress Notes (Signed)
   Heart Failure Stewardship Pharmacist Progress Note   PCP: Alysia Penna, MD PCP-Cardiologist: Rollene Rotunda, MD    HPI:  63 y.o. female with PMH of CHF, NICM, HTN, CKD (stage IIIb), ischemic stroke, paroxysmal afib, and insomnia. cMRI in 2023 without evidence of amyloid.  Recently admitted 3/14-3/18 with acute on chronic CHF. ECHO with LVEF 20-25%, moderate asymmetric LVH, G1DD, RAP 3. Was diuresed with IV lasix from 3/14-3/16 and was discharged on furosemide 20 mg daily, carvedilol 25 mg BID, hydralazine 25 mg TID + Imdur 30 mg daily.   Followed with HF TOC clinic on 3/24 and reported cough, congestion, and SOB on exertion. Denied chest pain, edema, PND/orthopnea. BP elevated 160/74. She was started on Jardiance 10 mg daily and hydralazine was increased to 37.5 mg TID.   Presented back to the ED on 3/29 with shortness of breath that was sudden onset. Found to have O2 sat of 83% on room air and was placed on nonrebreather. BNP was 459.6. Reports her weights had been stable at home. Optivol interrogation slightly elevated.   Declines shortness of breath, chest pain, orthopnea, lightheadedness, or dizziness. Trace LE edema on exam. Reviewed medication changes and answered all question and concerns. Reinforced low sodium diet at home. She does not have a pill box at home. Agreeable to [facility] pharmacy to provide medications at discharge.   Current HF Medications: Beta Blocker: carvedilol 25 mg BID Other: hydralazine 37.5 mg TID + Imdur 30 mg daily  Prior to admission HF Medications: Diuretic: furosemide 20 mg daily Beta blocker: carvedilol 25 mg BID SGLT2i: Jardiance 10 mg daily Other: hydralazine 37.5 mg TID + Imdur 30 mg daily  Pertinent Lab Values: Serum creatinine 3.25 (2.08 on 3/29), BUN 73, Potassium 3.5, Sodium 133, BNP 265.8, Magnesium 1.8, A1c 6.1   Vital Signs: Weight: 166 lbs (admission weight: 169 lbs) Blood pressure: 130/80s  Heart rate: 80s  I/O: net -0.2L yesterday;  net -1.1L since admission  Medication Assistance / Insurance Benefits Check: Does the patient have prescription insurance?  Yes Type of insurance plan: Financial risk analyst  Outpatient Pharmacy:  Prior to admission outpatient pharmacy: [facility] Is the patient willing to use [facility] TOC pharmacy at discharge? Yes Is the patient willing to transition their outpatient pharmacy to utilize a [facility] outpatient pharmacy?   No    Assessment: 1. Acute on chronic systolic CHF (LVEF 20-25%), due to NICM. NYHA class II symptoms. - Holding torsemide 20 mg daily and Jardiance 10 mg daily now that she is euvolemic and creatinine rising further today - Continue carvedilol 25 mg BID - No ACE/ARB/ARNI or MRA with AKI on CKD III   Plan: 1) Medication changes recommended at this time: - Agree with changes  2) Patient assistance: - None pending  3)  Education  - Patient has been educated on current HF medications and potential additions to HF medication regimen - Patient verbalizes understanding that over the next few months, these medication doses may change and more medications may be added to optimize HF regimen - Patient has been educated on basic disease state pathophysiology and goals of therapy   Sharen Hones, PharmD, BCPS Heart Failure Stewardship Pharmacist Phone [phone]

## 2024-02-04 NOTE — Progress Notes (Signed)
 Mobility Specialist Progress Note:   02/04/24 1200  Mobility  Activity Ambulated independently in hallway  Level of Assistance Modified independent, requires aide device or extra time  Assistive Device None  Distance Ambulated (ft) 300 ft  Activity Response Tolerated well  Mobility Referral Yes  Mobility visit 1 Mobility  Mobility Specialist Start Time (ACUTE ONLY) 1200  Mobility Specialist Stop Time (ACUTE ONLY) 1215  Mobility Specialist Time Calculation (min) (ACUTE ONLY) 15 min   Pt agreeable to mobility session. Required no physical assistance throughout ambulation with no AD. Pt with no c/o throughout, back in bed with all needs met.  Addison Lank Mobility Specialist Please contact via SecureChat or  Rehab office at [phone]

## 2024-02-04 NOTE — Plan of Care (Signed)

## 2024-02-04 NOTE — Hospital Course (Signed)
 [name] was admitted to the hospital with the working diagnosis of heart failure exacerbation.   63 y.o. female with medical history significant of heart failure, hypertension, overweight and sciatica who presented with dyspnea.    Patient was recently hospitalized 03/14 to 01/20/24 for URI with hyperreactive airway disease, and acute on chronic systolic heart failure exacerbation. She was treated with systemic steroids and IV furosemide. Instructed to continue prednisone 20 mg until 03/19.    She was doing well at home until the night prior to admission, when she noticed recurrent dyspnea. Apparently it was late afternoon early evening when she went to her restroom, on her way back she felt recurrent dyspnea. She had to seat on her bed to catch her breath. Denies any wheezing or cough.  She used her inhaler with no improvement in her symptoms. Because persistent and severe dyspnea she called EMS. She was found with 02 saturation of 83% on room air, she received bronchodilator therapy and was transported to the ED.   Na 140, K 3,5 Cl 109, bicarbonate 21, glucose 113, bun 40 cr 2,27  AST 18 ALT 13  BNP 459  Wbc 8,2 hgb 11.2 plt 225    Sars covid 19 negative Influenza negative  RSV negative    Chest radiograph with cardiomegaly, with bilateral hilar vascular congestion with no infiltrates or effusions, pacemaker defibrillator in place with one right atrial lead, one right ventricular and one left ventricular leads.    EKG 90 bpm, normal axis, qtc 514, left bundle branch block, atrial sensed and ventricular paced rhythm, with no significant ST segment or T wave changes.   04/03 up one Kg and having recurrent lower extremity edema. Resumed oral loop diuretic.  04/04 improved volume status and renal function, plan for discharge home and follow up as outpatient.

## 2024-02-04 NOTE — Progress Notes (Signed)
 Progress Note   Patient: [name] ZOX:[id number] DOB: [birth year] DOA: 01/31/2024     4 DOS: the patient was seen and examined on 02/04/2024   Brief hospital course: Mrs. Estabrooks was admitted to the hospital with the working diagnosis of heart failure exacerbation.   63 y.o. female with medical history significant of heart failure, hypertension, overweight and sciatica who presented with dyspnea.    Patient was recently hospitalized 03/14 to 01/20/24 for URI with hyperreactive airway disease, and acute on chronic systolic heart failure exacerbation. She was treated with systemic steroids and IV furosemide. Instructed to continue prednisone 20 mg until 03/19.    She was doing well at home until last night when she noticed recurrent dyspnea. Apparently it was late afternoon early evening when she went to her restroom, on her way back she felt recurrent dyspnea. She had to seat on her bed to catch her breath. Denies any wheezing or cough.  She used her inhaler with no improvement in her symptoms. Because persistent and severe dyspnea she called EMS. She was found with 02 saturation of 83% on room air, she received bronchodilator therapy and was transported to the ED.   Assessment and Plan: * Acute on chronic systolic CHF (congestive heart failure), NYHA class 4 (HCC) Echocardiogram with reduced LV systolic function EF 20 to 25%, moderate asymmetric left ventricular hypertrophy of the inferior segment, right ventricle with preserved systolic function, left and right atrium with no enlargement, no significant valvular disease.   Urine output is 1,450 ml Systolic blood pressure 134 mmHg range.   Plan to continue carvedilol and SGLT 2 inh After load reduction with hydralazine and isosorbide.  Holding loop diuretic until renal function more stable.  No RAAS inhibition due to acutely worsening GFR.   Hyperactive airway disease Improved dyspnea and ventilation.  Patient has been on systemic steroids  and antibiotics.  Plan to continue bronchodilator therapy and inhaled corticosteroids.  Continue 02 monitoring  Will need PFT as outpatient.   Paroxysmal A-fib (HCC) Continue rate control with carvedilol and anticoagulation with apixaban.   Non sustained ventricular tachycardia.  Device lowered VT monitor zone to 140 bpm.    CKD (chronic kidney disease), stage IV (HCC) AKI  hyponatremia and hypokalemia   Renal function with rising serum cr at 3.25 with K at 3,5 and serum bicarbonate at 22.  Na 133   Plan to hold on loop diuretic for now. Avoid hypotension and nephrotoxic medications.  Follow up on renal function in am.   Hyperlipidemia Continue with statin therapy.   Essential hypertension Continue blood pressure control with hydralazine, isosorbide and carvedilol.    History of CVA (cerebrovascular accident) Continue blood pressure control, statin therapy and anticoagulation with apixaban.         Subjective: Patient is feeling much better, with improved dyspnea and resolution of edema. No nausea or vomiting. Her renal function not yet back to baseline   Physical Exam: Vitals:   02/04/24 0725 02/04/24 0838 02/04/24 0840 02/04/24 1125  BP: 130/81   134/77  Pulse: 87   80  Resp: 18   18  Temp: 97.8 F (36.6 C)   97.7 F (36.5 C)  TempSrc: Oral   Oral  SpO2: 100% 97% 97% 93%  Weight:      Height:       Neurology awake and alert ENT With mild pallor with no icterus Cardiovascular with S1 and S2 present and regular with no gallops, rubs or murmurs Respiratory with  no rales or wheezing, no rhonchi Abdomen with no distention  Trace non pitting lower extremity edema  Data Reviewed:    Family Communication: no family at the bedside   Disposition: Status is: Inpatient Remains inpatient appropriate because: pending renal function to be more stable   Planned Discharge Destination: Home     Author: Coralie Keens, MD 02/04/2024 3:55 PM  For on  call review [email or web address].

## 2024-02-04 NOTE — Plan of Care (Signed)
  Problem: Nutrition: Goal: Adequate nutrition will be maintained Outcome: Completed/Met   Problem: Coping: Goal: Level of anxiety will decrease Outcome: Completed/Met   Problem: Elimination: Goal: Will not experience complications related to bowel motility Outcome: Completed/Met Goal: Will not experience complications related to urinary retention Outcome: Completed/Met   Problem: Pain Managment: Goal: General experience of comfort will improve and/or be controlled Outcome: Completed/Met   Problem: Safety: Goal: Ability to remain free from injury will improve Outcome: Completed/Met

## 2024-02-05 ENCOUNTER — Other Ambulatory Visit (HOSPITAL_COMMUNITY): Payer: Self-pay

## 2024-02-05 DIAGNOSIS — N170 Acute kidney failure with tubular necrosis: Secondary | ICD-10-CM

## 2024-02-05 DIAGNOSIS — I5023 Acute on chronic systolic (congestive) heart failure: Secondary | ICD-10-CM | POA: Diagnosis not present

## 2024-02-05 DIAGNOSIS — I480 Paroxysmal atrial fibrillation: Secondary | ICD-10-CM | POA: Diagnosis not present

## 2024-02-05 DIAGNOSIS — N184 Chronic kidney disease, stage 4 (severe): Secondary | ICD-10-CM | POA: Diagnosis not present

## 2024-02-05 DIAGNOSIS — J45909 Unspecified asthma, uncomplicated: Secondary | ICD-10-CM | POA: Diagnosis not present

## 2024-02-05 LAB — RENAL FUNCTION PANEL
Albumin: 2.8 g/dL — ABNORMAL LOW (ref 3.5–5.0)
Anion gap: 13 (ref 5–15)
BUN: 89 mg/dL — ABNORMAL HIGH (ref 6–20)
CO2: 22 mmol/L (ref 22–32)
Calcium: 8.5 mg/dL — ABNORMAL LOW (ref 8.9–10.3)
Chloride: 101 mmol/L (ref 98–111)
Creatinine, Ser: 3.32 mg/dL — ABNORMAL HIGH (ref 0.44–1.00)
GFR, Estimated: 15 mL/min — ABNORMAL LOW (ref >60)
Glucose, Bld: 124 mg/dL — ABNORMAL HIGH (ref 70–99)
Phosphorus: 3.9 mg/dL (ref 2.5–4.6)
Potassium: 3.5 mmol/L (ref 3.5–5.1)
Sodium: 136 mmol/L (ref 135–145)

## 2024-02-05 MED ORDER — FUROSEMIDE 40 MG PO TABS
40.0000 mg | ORAL_TABLET | Freq: Every day | ORAL | Status: DC
  Administered 2024-02-05 – 2024-02-06 (×2): 40 mg via ORAL
  Filled 2024-02-05 (×2): qty 1

## 2024-02-05 MED ORDER — ISOSORB DINITRATE-HYDRALAZINE 20-37.5 MG PO TABS
1.0000 | ORAL_TABLET | Freq: Three times a day (TID) | ORAL | Status: DC
Start: 2024-02-05 — End: 2024-02-06
  Administered 2024-02-05 – 2024-02-06 (×4): 1 via ORAL
  Filled 2024-02-05 (×4): qty 1

## 2024-02-05 NOTE — Progress Notes (Signed)
   Heart Failure Stewardship Pharmacist Progress Note   PCP: Alysia Penna, MD PCP-Cardiologist: Rollene Rotunda, MD    HPI:  63 y.o. female with PMH of CHF, NICM, HTN, CKD (stage IIIb), ischemic stroke, paroxysmal afib, and insomnia. cMRI in 2023 without evidence of amyloid.  Recently admitted 3/14-3/18 with acute on chronic CHF. ECHO with LVEF 20-25%, moderate asymmetric LVH, G1DD, RAP 3. Was diuresed with IV lasix from 3/14-3/16 and was discharged on furosemide 20 mg daily, carvedilol 25 mg BID, hydralazine 25 mg TID + Imdur 30 mg daily.   Followed with HF TOC clinic on 3/24 and reported cough, congestion, and SOB on exertion. Denied chest pain, edema, PND/orthopnea. BP elevated 160/74. She was started on Jardiance 10 mg daily and hydralazine was increased to 37.5 mg TID.   Presented back to the ED on 3/29 with shortness of breath that was sudden onset. Found to have O2 sat of 83% on room air and was placed on nonrebreather. BNP was 459.6. Reports her weights had been stable at home. Optivol interrogation slightly elevated.   Patient accompanied by her daughter today. Denies shortness of breath, chest pain, orthopnea, lightheadedness, or dizziness. Reviewed medication changes and answered all question and concerns. She does not have a pill box at home. Agreeable to [facility] pharmacy to provide medications at discharge.   Current HF Medications: Beta Blocker: carvedilol 25 mg BID SGLT2i: Jardiance 10 mg daily Other: hydralazine 50 mg TID + Isordil 30 mg TID  Prior to admission HF Medications: Diuretic: furosemide 20 mg daily Beta blocker: carvedilol 25 mg BID SGLT2i: Jardiance 10 mg daily Other: hydralazine 37.5 mg TID + Imdur 30 mg daily  Pertinent Lab Values: Serum creatinine 3.32 (2.08 on 3/29), BUN 89, Potassium 3.5, Sodium 136, BNP 265.8, Magnesium 1.8, A1c 6.1   Vital Signs: Weight: 169 lbs (admission weight: 169 lbs) Blood pressure: 110-120/80s  Heart rate: 80s  I/O: net  -0.7L yesterday; net -2.2L since admission  Medication Assistance / Insurance Benefits Check: Does the patient have prescription insurance?  Yes Type of insurance plan: Financial risk analyst  Outpatient Pharmacy:  Prior to admission outpatient pharmacy: [facility] Is the patient willing to use [facility] TOC pharmacy at discharge? Yes Is the patient willing to transition their outpatient pharmacy to utilize a [facility] outpatient pharmacy?   No    Assessment: 1. Acute on chronic systolic CHF (LVEF 20-25%), due to NICM. NYHA class II symptoms. - Holding torsemide 20 mg daily now that she is euvolemic and creatinine rising further today - Continue carvedilol 25 mg BID - No ACE/ARB/ARNI or MRA with AKI on CKD III - Continue Jardiance 10 mg daily - Consider transitioning hydralazine / isordil to BiDil for lower pill burden.    Plan: 1) Medication changes recommended at this time: - Transition hydralazine/isordil to BiDil for lower pill burden  2) Patient assistance: - None pending - BiDil copay $10  3)  Education  - Patient has been educated on current HF medications and potential additions to HF medication regimen - Patient verbalizes understanding that over the next few months, these medication doses may change and more medications may be added to optimize HF regimen - Patient has been educated on basic disease state pathophysiology and goals of therapy   Sharen Hones, PharmD, BCPS Heart Failure Stewardship Pharmacist Phone [phone]

## 2024-02-05 NOTE — Progress Notes (Signed)
 Progress Note   Patient: [name]:[id number] DOB: 1961-07-28 DOA: 01/31/2024     5 DOS: the patient was seen and examined on 02/05/2024   Brief hospital course: Mrs. [name] was admitted to the hospital with the working diagnosis of heart failure exacerbation.   63 y.o. female with medical history significant of heart failure, hypertension, overweight and sciatica who presented with dyspnea.    Patient was recently hospitalized 03/14 to 01/20/24 for URI with hyperreactive airway disease, and acute on chronic systolic heart failure exacerbation. She was treated with systemic steroids and IV furosemide. Instructed to continue prednisone 20 mg until 03/19.    She was doing well at home until last night when she noticed recurrent dyspnea. Apparently it was late afternoon early evening when she went to her restroom, on her way back she felt recurrent dyspnea. She had to seat on her bed to catch her breath. Denies any wheezing or cough.  She used her inhaler with no improvement in her symptoms. Because persistent and severe dyspnea she called EMS. She was found with 02 saturation of 83% on room air, she received bronchodilator therapy and was transported to the ED.   Na 140, K 3,5 Cl 109, bicarbonate 21, glucose 113, bun 40 cr 2,27  AST 18 ALT 13  BNP 459  Wbc 8,2 hgb 11.2 plt 225    Sars covid 19 negative Influenza negative  RSV negative    Chest radiograph with cardiomegaly, with bilateral hilar vascular congestion with no infiltrates or effusions, pacemaker defibrillator in place with one right atrial lead, one right ventricular and one left ventricular leads.    EKG 90 bpm, normal axis, qtc 514, left bundle branch block, atrial sensed and ventricular paced rhythm, with no significant ST segment or T wave changes.   04/03 up one Kg and having recurrent lower extremity edema. Resumed oral loop diuretic.   Assessment and Plan: * Acute on chronic systolic CHF (congestive heart failure),  NYHA class 4 (HCC) Echocardiogram with reduced LV systolic function EF 20 to 25%, moderate asymmetric left ventricular hypertrophy of the inferior segment, right ventricle with preserved systolic function, left and right atrium with no enlargement, no significant valvular disease.   Urine output is 800 ml Systolic blood pressure 110 mmHg range.   Plan to continue carvedilol and SGLT 2 inh After load reduction with hydralazine and isosorbide.  No RAAS inhibition due to acutely worsening GFR.   Patient is up 1 kg from yesterday and is having recurrent edema, will resume oral diuretic therapy with furosemide 40 mg daily. (At home she is on 20 mg)   Hyperactive airway disease Improved dyspnea and ventilation.  Patient has been on systemic steroids and antibiotics.  Plan to continue bronchodilator therapy and inhaled corticosteroids.  Continue 02 monitoring  Will need PFT as outpatient.   Paroxysmal A-fib (HCC) Continue rate control with carvedilol and anticoagulation with apixaban.   Non sustained ventricular tachycardia.  Device lowered VT monitor zone to 140 bpm.    CKD (chronic kidney disease), stage IV (HCC) AKI  hyponatremia and hypokalemia   Renal function with worsening serum cr at 3,32 with K at 3,5 and serum bicarbonate at 22  Na 135 and P 3.9   Patient with signs of hypervolemia on examination, will resume loop diuretic with furosemide 40 mg po daily.  Follow up renal function and electrolytes in am Avoid hypotension and nephrotoxic medications.   Hyperlipidemia Continue with statin therapy.   Essential hypertension Continue  blood pressure control with hydralazine, isosorbide and carvedilol.    History of CVA (cerebrovascular accident) Continue blood pressure control, statin therapy and anticoagulation with apixaban.         Subjective: Patient with no chest pain, no PND or orthopnea.   Physical Exam: Vitals:   02/05/24 0409 02/05/24 0751 02/05/24 1032  02/05/24 1417  BP:  (!) 122/104 123/79 110/80  Pulse: 82  82 77  Resp:  17  16  Temp:  97.6 F (36.4 C) 98.1 F (36.7 C) 97.8 F (36.6 C)  TempSrc:  Oral Oral Oral  SpO2: 99% 100% 100% 97%  Weight:      Height:       Neurology awake and alert ENT with mild pallor with no icterus  Cardiovascular with S1 and S2 present and regular with no gallops, rubs or murmurs Respiratory with no rales or wheezing, no rhonchi Abdomen with no distention  Positive lower extremity edema +  Data Reviewed:    Family Communication: I spoke with patient's daughter at the bedside, we talked in detail about patient's condition, plan of care and prognosis and all questions were addressed.   Disposition: Status is: Inpatient Remains inpatient appropriate because: volume overload   Planned Discharge Destination: Home     Author: Coralie Keens, MD 02/05/2024 3:29 PM  For on call review [email or web address].

## 2024-02-05 NOTE — Progress Notes (Addendum)
 Patient Name: [name] Date of Encounter: 02/05/2024 [facility] Cardiologist: Rollene Rotunda, MD   Interval Summary  .    Patient reported that shortness of breath and lower extremity edema are improving. Denies chest pain.  Vital Signs .    Vitals:   02/05/24 0407 02/05/24 0408 02/05/24 0409 02/05/24 0751  BP: 110/72 110/72  (!) 122/104  Pulse: 85 (!) 36 82   Resp:  18  17  Temp:  97.9 F (36.6 C)  97.6 F (36.4 C)  TempSrc:  Oral  Oral  SpO2: 99% 97% 99% 100%  Weight:  76.8 kg    Height:        Intake/Output Summary (Last 24 hours) at 02/05/2024 0823 Last data filed at 02/05/2024 0751 Gross per 24 hour  Intake 890 ml  Output 800 ml  Net 90 ml      02/05/2024    4:08 AM 02/04/2024    4:32 AM 02/02/2024    2:47 AM  Last 3 Weights  Weight (lbs) 169 lb 5 oz 166 lb 9.6 oz 164 lb 1.6 oz  Weight (kg) 76.8 kg 75.569 kg 74.435 kg      Telemetry/ECG    Ventricular paced rate in the 80's and 90's. Had an episode of VT for 16 beats yesterday at 1552. Had frequent PVC's 5694/41315 - 13.8% - Personally Reviewed  Physical Exam .   GEN: No acute distress.   Neck: No JVD Cardiac: RRR, no murmurs, rubs, or gallops.  Respiratory: Bibasilar crackles. GI: Soft, nontender, non-distended  MS: No edema  Assessment & Plan .     [name] is a 63 y.o. female with a hx of nonischemic cardiomyopathy, hypertension, s/p BiV ICD, stage IIIb CKD, paroxysmal atrial fibrillation on Eliquis, acute left CVA in 2023 who is being seen 01/31/2024 for the evaluation of NSVT at the request of Dr. Madilyn Hook.    Acute on Chronic HFrEF S/p BiV ICD -- on 01/17/24 known LVEF of 20-25%, cMRI 2023 without evidence of prior MI or amyloid. Underwent BiV ICD placement 01/2023 -- presented with dyspnea and cough. OptiVol on device interrogation on admission slightly elevated but still appropriate per reports -- Was diuresed on 60 mg IV lasix. This was stopped and last dose was received at 0350 on  02/02/24. Weight is similar to admission. Appears euvolemic on exam. I/O are net down 0.150 over the past day. Cr has continued to trend up slightly at 3.32 today from 2.08 several days ago and a baseline of 2.2-2.6. BUN trended up to 89 today from 41 several days ago. Potassium 3.5 today. continue to hold torsemide 20 mg daily due to worsening renal function and patient appearing slightly volume overloaded. -- Consider renal artery duplex outpatient for worsening renal function -- has follow up with heart failure in 1 week. -- clear to go back to work  next week and needs note  -- Plan for discharge today  -- GDMT: Continue coreg, jardiance. Stop hydralazine, Imdur Start BIDIL 20-37.5 TID   Paroxysmal atrial fibrillation NSVT -- on device interrogation noted to have atrial tachycardia with 1:1 conduction and episodes of NSVT -- VT monitor zone was lower to 140 bpm per Dr. Jimmey Ralph on 01/31/34 -- Potassium 3.5 today. Torsemide held. goal magnesium 2.0 and potassium 4.0.  -- Continue Eliquis 5mg  BID    AKI on CKD stage IIIb -- baseline Cr 2.2-2.6, creatinine continues to trend up (is 3.32 today). -- as above, IV lasix was  stopped. Will stop torsemide. -- follow BMET   HTN -- continue coreg 25mg  BID, hydralazine 37.5mg  TID, Imdur 30mg  daily   HLD -- continue statin   History of HTN Chronic normocytic anemia -- Management per primary  For questions or updates, please contact [facility] Please consult [email or web address] for contact info under        Signed, Arabella Merles, PA-C

## 2024-02-05 NOTE — TOC Benefit Eligibility Note (Signed)
 Pharmacy Patient Advocate Encounter  Insurance verification completed.    The patient is insured through [employer].     Ran test claim for Bidil and the current 30 day co-pay is $10.   This test claim was processed through [facility]- copay amounts may vary at other pharmacies due to pharmacy/plan contracts, or as the patient moves through the different stages of their insurance plan.

## 2024-02-05 NOTE — Plan of Care (Signed)

## 2024-02-05 NOTE — Progress Notes (Signed)
 Patient complaining of discomfort due to saline locked IV in [facility] Iv site. Dr. Ella Jubilee states that IV can be removed and patient can remain with no IV access since no IV meds are ordered.

## 2024-02-06 ENCOUNTER — Encounter: Payer: Self-pay | Admitting: Cardiology

## 2024-02-06 ENCOUNTER — Other Ambulatory Visit (HOSPITAL_COMMUNITY): Payer: Self-pay

## 2024-02-06 DIAGNOSIS — I480 Paroxysmal atrial fibrillation: Secondary | ICD-10-CM | POA: Diagnosis not present

## 2024-02-06 DIAGNOSIS — N184 Chronic kidney disease, stage 4 (severe): Secondary | ICD-10-CM | POA: Diagnosis not present

## 2024-02-06 DIAGNOSIS — J45909 Unspecified asthma, uncomplicated: Secondary | ICD-10-CM | POA: Diagnosis not present

## 2024-02-06 DIAGNOSIS — I5023 Acute on chronic systolic (congestive) heart failure: Secondary | ICD-10-CM | POA: Diagnosis not present

## 2024-02-06 LAB — BASIC METABOLIC PANEL WITH GFR
Anion gap: 14 (ref 5–15)
BUN: 84 mg/dL — ABNORMAL HIGH (ref 6–20)
CO2: 22 mmol/L (ref 22–32)
Calcium: 8.8 mg/dL — ABNORMAL LOW (ref 8.9–10.3)
Chloride: 102 mmol/L (ref 98–111)
Creatinine, Ser: 2.9 mg/dL — ABNORMAL HIGH (ref 0.44–1.00)
GFR, Estimated: 18 mL/min — ABNORMAL LOW (ref >60)
Glucose, Bld: 118 mg/dL — ABNORMAL HIGH (ref 70–99)
Potassium: 3.5 mmol/L (ref 3.5–5.1)
Sodium: 138 mmol/L (ref 135–145)

## 2024-02-06 LAB — BRAIN NATRIURETIC PEPTIDE: B Natriuretic Peptide: 116.3 pg/mL — ABNORMAL HIGH (ref 0.0–100.0)

## 2024-02-06 MED ORDER — FUROSEMIDE 20 MG PO TABS
40.0000 mg | ORAL_TABLET | Freq: Every day | ORAL | 1 refills | Status: AC
Start: 2024-02-06 — End: ?
  Filled 2024-02-06: qty 30, 15d supply, fill #0

## 2024-02-06 MED ORDER — ISOSORB DINITRATE-HYDRALAZINE 20-37.5 MG PO TABS
1.0000 | ORAL_TABLET | Freq: Three times a day (TID) | ORAL | 0 refills | Status: DC
  Filled 2024-02-06: qty 90, 30d supply, fill #0

## 2024-02-06 NOTE — Progress Notes (Addendum)
 Rounding Note    Patient Name: [name] Date of Encounter: 02/06/2024  [facility] Cardiologist: Rollene Rotunda, MD   Subjective   Pt has no complaints.   Inpatient Medications    Scheduled Meds:  apixaban  5 mg Oral BID   arformoterol  15 mcg Nebulization BID   atorvastatin  80 mg Oral Daily   budesonide (PULMICORT) nebulizer solution  0.25 mg Nebulization BID   carvedilol  25 mg Oral BID WC   empagliflozin  10 mg Oral Daily   fluticasone  2 spray Each Nare BID   furosemide  40 mg Oral Daily   isosorbide-hydrALAZINE  1 tablet Oral TID   Continuous Infusions:  PRN Meds: acetaminophen **OR** acetaminophen, albuterol, HYDROcodone-acetaminophen, ondansetron **OR** ondansetron (ZOFRAN) IV, zolpidem   Vital Signs    Vitals:   02/06/24 0342 02/06/24 0732 02/06/24 0813 02/06/24 0814  BP: 121/83 (!) 109/94    Pulse:  85 83   Resp: 17 18 16    Temp: 98 F (36.7 C) 98.3 F (36.8 C)    TempSrc: Oral Oral    SpO2: 99% 95% 96% 96%  Weight:      Height:        Intake/Output Summary (Last 24 hours) at 02/06/2024 0901 Last data filed at 02/05/2024 1701 Gross per 24 hour  Intake 120 ml  Output 200 ml  Net -80 ml      02/06/2024    3:39 AM 02/05/2024    4:08 AM 02/04/2024    4:32 AM  Last 3 Weights  Weight (lbs) 167 lb 5.3 oz 169 lb 5 oz 166 lb 9.6 oz  Weight (kg) 75.9 kg 76.8 kg 75.569 kg      Telemetry    Paced rhythm - Personally Reviewed  ECG    No new tracings - Personally Reviewed  Physical Exam   GEN: No acute distress.   Neck: No JVD Cardiac: RRR, no murmurs, rubs, or gallops.  Respiratory: Clear to auscultation bilaterally. GI: Soft, nontender, non-distended  MS: No edema; No deformity. Neuro:  Nonfocal  Psych: Normal affect   Labs    High Sensitivity Troponin:   Recent Labs  Lab 01/16/24 2008 01/16/24 2220  TROPONINIHS 21* 28*     Chemistry Recent Labs  Lab 01/31/24 0136 01/31/24 0921 01/31/24 1506 02/03/24 0417  02/03/24 0952 02/04/24 0254 02/05/24 0314  NA 140 140   < > 137  --  133* 136  K 3.5 3.6   < > 4.0  --  3.5 3.5  CL 109 109   < > 101  --  99 101  CO2 21* 20*   < > 23  --  22 22  GLUCOSE 113* 108*   < > 196*  --  159* 124*  BUN 40* 37*   < > 61*  --  73* 89*  CREATININE 2.27* 2.08*   < > 3.14*  --  3.25* 3.32*  CALCIUM 8.6* 8.7*   < > 9.4  --  8.7* 8.5*  MG  --  1.7  --   --  1.8  --   --   PROT 6.8  --   --   --   --   --   --   ALBUMIN 3.0*  --    < > 3.2*  --  3.0* 2.8*  AST 18  --   --   --   --   --   --   ALT 13  --   --   --   --   --   --  ALKPHOS 73  --   --   --   --   --   --   BILITOT 0.6  --   --   --   --   --   --   GFRNONAA 24* 27*   < > 16*  --  16* 15*  ANIONGAP 10 11   < > 13  --  12 13   < > = values in this interval not displayed.    Lipids No results for input(s): "CHOL", "TRIG", "HDL", "LABVLDL", "LDLCALC", "CHOLHDL" in the last 168 hours.  Hematology Recent Labs  Lab 02/02/24 0305 02/03/24 0417 02/04/24 0254  WBC 9.1 10.3 19.5*  RBC 3.81* 3.69* 3.60*  HGB 11.6* 11.5* 11.1*  HCT 35.7* 34.2* 33.1*  MCV 93.7 92.7 91.9  MCH 30.4 31.2 30.8  MCHC 32.5 33.6 33.5  RDW 13.7 13.4 13.3  PLT 252 231 218   Thyroid No results for input(s): "TSH", "FREET4" in the last 168 hours.  BNP Recent Labs  Lab 01/31/24 0136 02/02/24 1233 02/06/24 0324  BNP 459.6* 265.8* 116.3*    DDimer No results for input(s): "DDIMER" in the last 168 hours.   Radiology    No results found.  Cardiac Studies   Echo 01/17/24: 1. Left ventricular ejection fraction, by estimation, is 20 to 25%. The  left ventricle has severely decreased function. There is moderate  asymmetric left ventricular hypertrophy of the inferior segment. Left  ventricular diastolic parameters are  consistent with Grade I diastolic dysfunction (impaired relaxation).   2. Right ventricular systolic function is normal. The right ventricular  size is normal. Tricuspid regurgitation signal is inadequate  for assessing  PA pressure.   3. No evidence of mitral valve regurgitation.   4. The aortic valve was not well visualized. Aortic valve regurgitation  is not visualized.   5. The inferior vena cava is normal in size with greater than 50%  respiratory variability, suggesting right atrial pressure of 3 mmHg.   Patient Profile     63 y.o. female with a hx of nonischemic cardiomyopathy, hypertension, s/p BiV ICD, stage IIIb CKD, paroxysmal atrial fibrillation on Eliquis, acute left CVA in 2023 who is being seen 01/31/2024 for the evaluation of NSVT and CHF at the request of Dr. Madilyn Hook.   Assessment & Plan    Acute on chronic systolic heart failure BiV ICD in place - hx of LVEF in the 20% range - cMRI without evidence of prior MI or amyloid - ICD placed 32024 - presented with S/S of volume overload with optivol showing slightly elevated thoracic impedence  - repeat limited echo with LVEF 20-25%, no MR - has been diuresing on IV lasix, switched to PO lasix 40 mg daily - GDMT: 25 mg coreg BID, bidil 1 tab TID, 10 mg jardiance, 40 mg lasix daily (previous home dose was 20 mg daily) - BNP 116  - appears euvolemic today - will make lasix PRN 40 mg for edema/weight gain   Acute on chronic kidney disease - sCr 3.32 yesterday, BMP pending today   PAF Hx of CVA Chronic anticoagulation - continue coreg and eliquis - telemetry with paced rhythm   Hyperlipidemia with LDL goal < 70 - continue statin   I will arrange cardiology follow up.  If creatinine trending down, can likely discharge from a cardiology perspective. Recommend BMP early next week.     For questions or updates, please contact [facility] Please consult [email or web address] for contact info under  Signed, Roe Rutherford Rahshawn Remo, PA  02/06/2024, 9:01 AM

## 2024-02-06 NOTE — Discharge Summary (Signed)
 Physician Discharge Summary   Patient: [name] MRN: [id number] DOB: 21-May-1961  Admit date:     01/31/2024  Discharge date: 02/06/24  Discharge Physician: York Ram Shakera Ebrahimi   PCP: Alysia Penna, MD   Recommendations at discharge:    Furosemide increased to 40 mg, to take as needed for signs of volume overload, weight increase 2 to 3 lbs in 24 hrs or 5 lbs in 7 days.  Consolidated hydralazine/ isosorbide into Bidil.  Follow up renal function and electrolytes in 7 days as outpatient  Follow up with Dr Link Snuffer in 7 to 10 days  Follow up with Cardiology as scheduled.   Discharge Diagnoses: Principal Problem:   Acute on chronic systolic CHF (congestive heart failure), NYHA class 4 (HCC) Active Problems:   Hyperactive airway disease   Paroxysmal A-fib (HCC)   CKD (chronic kidney disease), stage IV (HCC)   Hyperlipidemia   Essential hypertension   History of CVA (cerebrovascular accident)   NSVT (nonsustained ventricular tachycardia) (HCC)   Acute renal failure with acute tubular necrosis superimposed on stage 4 chronic kidney disease (HCC)   PAF (paroxysmal atrial fibrillation) (HCC)  Resolved Problems:   * No resolved hospital problems. [facility] Course: Mrs. [name] was admitted to the hospital with the working diagnosis of heart failure exacerbation.   63 y.o. female with medical history significant of heart failure, hypertension, overweight and sciatica who presented with dyspnea.    Patient was recently hospitalized 03/14 to 01/20/24 for URI with hyperreactive airway disease, and acute on chronic systolic heart failure exacerbation. She was treated with systemic steroids and IV furosemide. Instructed to continue prednisone 20 mg until 03/19.    She was doing well at home until the night prior to admission, when she noticed recurrent dyspnea. Apparently it was late afternoon early evening when she went to her restroom, on her way back she felt recurrent dyspnea. She  had to seat on her bed to catch her breath. Denies any wheezing or cough.  She used her inhaler with no improvement in her symptoms. Because persistent and severe dyspnea she called EMS. She was found with 02 saturation of 83% on room air, she received bronchodilator therapy and was transported to the ED.   Na 140, K 3,5 Cl 109, bicarbonate 21, glucose 113, bun 40 cr 2,27  AST 18 ALT 13  BNP 459  Wbc 8,2 hgb 11.2 plt 225    Sars covid 19 negative Influenza negative  RSV negative    Chest radiograph with cardiomegaly, with bilateral hilar vascular congestion with no infiltrates or effusions, pacemaker defibrillator in place with one right atrial lead, one right ventricular and one left ventricular leads.    EKG 90 bpm, normal axis, qtc 514, left bundle branch block, atrial sensed and ventricular paced rhythm, with no significant ST segment or T wave changes.   04/03 up one Kg and having recurrent lower extremity edema. Resumed oral loop diuretic.  04/04 improved volume status and renal function, plan for discharge home and follow up as outpatient.   Assessment and Plan: * Acute on chronic systolic CHF (congestive heart failure), NYHA class 4 (HCC) Echocardiogram with reduced LV systolic function EF 20 to 25%, moderate asymmetric left ventricular hypertrophy of the inferior segment, right ventricle with preserved systolic function, left and right atrium with no enlargement, no significant valvular disease.   Patient was placed on furosemide for diuresis, negative fluid balance was achieved, - 2,024 ml, with improvement in her symptoms.  Plan to continue carvedilol and SGLT 2 inh After load reduction with hydralazine and isosorbide (bidil)   No RAAS inhibition due to acutely worsening GFR.  As needed furosemide 40 mg for sings of volume overload.  Will need close follow up as outpatient.   Hyperactive airway disease Improved dyspnea and ventilation.  Patient was placed on systemic  steroids and antibiotics.  Continue bronchodilator therapy as outpatient.   Will need PFT as outpatient.   Paroxysmal A-fib (HCC) Continue rate control with carvedilol and anticoagulation with apixaban.   Non sustained ventricular tachycardia.  Device lowered VT monitor zone to 140 bpm.    CKD (chronic kidney disease), stage IV (HCC) AKI  hyponatremia and hypokalemia   Renal function with serum cr at 2,90 with K at 3,5 and serum bicarbonate at 22.  Na 138   Continue as needed furosemide as outpatient.  Follow up renal function and electrolytes as outpatient in 7 days.   Hyperlipidemia Continue with statin therapy.   Essential hypertension Continue blood pressure control with hydralazine, isosorbide and carvedilol.    History of CVA (cerebrovascular accident) Continue blood pressure control, statin therapy and anticoagulation with apixaban.         Consultants: cardiology  Procedures performed: none   Disposition: Home Diet recommendation:  Cardiac diet DISCHARGE MEDICATION: Allergies as of 02/06/2024       Reactions   Latex Itching   Chlorhexidine Gluconate Hives   Unknown if this is the cause of the hives, raised areas around lab draw sites. No tape and area wiped with chlorhexidine.        Medication List     STOP taking these medications    hydrALAZINE 25 MG tablet Commonly known as: APRESOLINE   isosorbide mononitrate 30 MG 24 hr tablet Commonly known as: IMDUR       TAKE these medications    albuterol 108 (90 Base) MCG/ACT inhaler Commonly known as: VENTOLIN HFA Inhale 1 puff into the lungs every 6 (six) hours as needed.   atorvastatin 80 MG tablet Commonly known as: LIPITOR Take 1 tablet (80 mg total) by mouth daily.   benzonatate 200 MG capsule Commonly known as: TESSALON Take 200 mg by mouth 3 (three) times daily as needed.   carvedilol 25 MG tablet Commonly known as: COREG Take 1 tablet (25 mg total) by mouth 2 (two) times daily  with a meal.   Eliquis 5 MG Tabs tablet Generic drug: apixaban TAKE 1 TABLET(5 MG) BY MOUTH TWICE DAILY   empagliflozin 10 MG Tabs tablet Commonly known as: Jardiance Take 1 tablet (10 mg total) by mouth daily before breakfast.   furosemide 20 MG tablet Commonly known as: LASIX Take 2 tablets (40 mg total) by mouth daily. Take in case of weight gain 2 to 3 lbs in 24 hrs or 5 lbs in 7 days until weight back to baseline What changed:  how much to take additional instructions   HYDROcodone-acetaminophen 5-325 MG tablet Commonly known as: NORCO/VICODIN Take 1 tablet by mouth daily as needed for severe pain (pain score 7-10).   isosorbide-hydrALAZINE 20-37.5 MG tablet Commonly known as: BIDIL Take 1 tablet by mouth 3 (three) times daily.   TYLENOL PO Take 1,000 mg by mouth daily as needed (for pain).   zolpidem 5 MG tablet Commonly known as: AMBIEN Take 5 mg by mouth at bedtime as needed for sleep.        Follow-up Information     Alysia Penna, MD Follow up.  Specialty: Internal Medicine Contact information: [address] [place] [address] [phone]                Discharge Exam: Ceasar Mons Weights   02/04/24 0432 02/05/24 0408 02/06/24 0339  Weight: (P) 75.6 kg 76.8 kg 75.9 kg   BP (!) 109/94 (BP Location: Left Arm)   Pulse 83   Temp 98.3 F (36.8 C) (Oral)   Resp 16   Ht 5\' 7"  (1.702 m)   Wt 75.9 kg   SpO2 96%   BMI 26.21 kg/m   Patient is feeling better, no chest pain or dyspnea, no PND or orthopnea, improved lower extremity edema   Neurology awake and alert ENT with no pallor or icterus Cardiovascular with S1 and S2 present and regular with no gallops, rubs or murmurs No JVD Trace non pitting lower extremity edema Respiratory with no rales or wheezing, no rhonchi Abdomen with no distention   Condition at discharge: stable  The results of significant diagnostics from this hospitalization (including imaging, microbiology, ancillary  and laboratory) are listed below for reference.   Imaging Studies: DG Chest Port 1 View Result Date: 01/31/2024 CLINICAL DATA:  Shortness of breath EXAM: PORTABLE CHEST 1 VIEW COMPARISON:  01/16/2024 FINDINGS: Stable cardiomegaly. Aortic atherosclerotic calcification. Left chest wall ICD. No focal consolidation, pleural effusion, or pneumothorax. No displaced rib fractures. IMPRESSION: No active disease. Cardiomegaly. Electronically Signed   By: Minerva Fester M.D.   On: 01/31/2024 02:09   ECHOCARDIOGRAM LIMITED Result Date: 01/17/2024    ECHOCARDIOGRAM LIMITED REPORT   Patient Name:   RAZIYAH VANVLECK [facility] Date of Exam: 01/17/2024 Medical Rec #:  [id number]   Height:       67.0 in Accession #:    [id number]  Weight:       174.0 lb Date of Birth:  04/09/1961  BSA:          1.906 m Patient Age:    62 years    BP:           137/89 mmHg Patient Gender: F           HR:           80 bpm. Exam Location:  Inpatient Procedure: Limited Echo and Limited Color Doppler (Both Spectral and Color Flow            Doppler were utilized during procedure). Indications:    CHF - Acute Systolic I50.21  History:        Patient has prior history of Echocardiogram examinations, most                 recent 01/02/2024. CHF and Cardiomyopathy, Stroke and CKD, stage                 4, Arrythmias:Atrial Fibrillation, Signs/Symptoms:Chest Pain and                 Dyspnea; Risk Factors:Hypertension and Dyslipidemia.  Sonographer:    Lucendia Herrlich RCS Referring Phys: [phone] SUBRINA SUNDIL IMPRESSIONS  1. Left ventricular ejection fraction, by estimation, is 20 to 25%. The left ventricle has severely decreased function. There is moderate asymmetric left ventricular hypertrophy of the inferior segment. Left ventricular diastolic parameters are consistent with Grade I diastolic dysfunction (impaired relaxation).  2. Right ventricular systolic function is normal. The right ventricular size is normal. Tricuspid regurgitation signal is inadequate for  assessing PA pressure.  3. No evidence of mitral valve regurgitation.  4. The aortic valve was not well  visualized. Aortic valve regurgitation is not visualized.  5. The inferior vena cava is normal in size with greater than 50% respiratory variability, suggesting right atrial pressure of 3 mmHg. Comparison(s): No significant change from prior study. FINDINGS  Left Ventricle: Left ventricular ejection fraction, by estimation, is 20 to 25%. The left ventricle has severely decreased function. There is moderate asymmetric left ventricular hypertrophy of the inferior segment. Left ventricular diastolic parameters  are consistent with Grade I diastolic dysfunction (impaired relaxation). Right Ventricle: The right ventricular size is normal. Right ventricular systolic function is normal. Tricuspid regurgitation signal is inadequate for assessing PA pressure. Left Atrium: Left atrial size was normal in size. Right Atrium: Right atrial size was normal in size. Pericardium: There is no evidence of pericardial effusion. Tricuspid Valve: Tricuspid valve regurgitation is trivial. Aortic Valve: The aortic valve was not well visualized. Aortic valve regurgitation is not visualized. Aortic valve peak gradient measures 11.6 mmHg. Venous: The inferior vena cava is normal in size with greater than 50% respiratory variability, suggesting right atrial pressure of 3 mmHg. Additional Comments: A device lead is visualized.  LEFT VENTRICLE PLAX 2D LVIDd:         4.50 cm LVIDs:         4.00 cm LV PW:         1.40 cm LV IVS:        1.10 cm LVOT diam:     2.00 cm LV SV:         48 LV SV Index:   25 LVOT Area:     3.14 cm  LV Volumes (MOD) LV vol d, MOD A2C: 191.0 ml LV vol d, MOD A4C: 187.0 ml LV vol s, MOD A2C: 135.0 ml LV vol s, MOD A4C: 149.0 ml LV SV MOD A2C:     56.0 ml LV SV MOD A4C:     187.0 ml LV SV MOD BP:      48.2 ml IVC IVC diam: 1.90 cm LEFT ATRIUM         Index LA diam:    3.70 cm 1.94 cm/m  AORTIC VALVE AV Area (Vmax): 1.91  cm AV Vmax:        170.00 cm/s AV Peak Grad:   11.6 mmHg LVOT Vmax:      103.43 cm/s LVOT Vmean:     62.700 cm/s LVOT VTI:       0.153 m  AORTA Ao Root diam: 3.40 cm Ao Asc diam:  3.70 cm MITRAL VALVE MV Area (PHT): 4.53 cm     SHUNTS MV Decel Time: 168 msec     Systemic VTI:  0.15 m MV E velocity: 67.70 cm/s   Systemic Diam: 2.00 cm MV A velocity: 124.00 cm/s MV E/A ratio:  0.55 Photographer signed by Carolan Clines Signature Date/Time: 01/17/2024/12:25:01 PM    Final    DG Chest Portable 1 View Result Date: 01/16/2024 CLINICAL DATA:  Shortness of breath EXAM: PORTABLE CHEST 1 VIEW COMPARISON:  01/03/2023 FINDINGS: Left ICD remains in place, unchanged. Cardiomegaly. No confluent opacities, effusions or edema. No acute bony abnormality. IMPRESSION: Cardiomegaly.  No active disease. Electronically Signed   By: Charlett Nose M.D.   On: 01/16/2024 21:24    Microbiology: Results for orders placed or performed during the hospital encounter of 01/31/24  Resp panel by RT-PCR (RSV, Flu A&B, Covid) Anterior Nasal Swab     Status: None   Collection Time: 01/31/24  1:40 AM   Specimen: Anterior Nasal  Swab  Result Value Ref Range Status   SARS Coronavirus 2 by RT PCR NEGATIVE NEGATIVE Final   Influenza A by PCR NEGATIVE NEGATIVE Final   Influenza B by PCR NEGATIVE NEGATIVE Final    Comment: (NOTE) The Xpert Xpress SARS-CoV-2/FLU/RSV plus assay is intended as an aid in the diagnosis of influenza from Nasopharyngeal swab specimens and should not be used as a sole basis for treatment. Nasal washings and aspirates are unacceptable for Xpert Xpress SARS-CoV-2/FLU/RSV testing.  Fact Sheet for Patients: BloggerCourse.com  Fact Sheet for Healthcare Providers: SeriousBroker.it  This test is not yet approved or cleared by the [place] FDA and has been authorized for detection and/or diagnosis of SARS-CoV-2 by FDA under an Emergency Use  Authorization (EUA). This EUA will remain in effect (meaning this test can be used) for the duration of the COVID-19 declaration under Section 564(b)(1) of the Act, 21 U.S.C. section 360bbb-3(b)(1), unless the authorization is terminated or revoked.     Resp Syncytial Virus by PCR NEGATIVE NEGATIVE Final    Comment: (NOTE) Fact Sheet for Patients: BloggerCourse.com  Fact Sheet for Healthcare Providers: SeriousBroker.it  This test is not yet approved or cleared by the [place] FDA and has been authorized for detection and/or diagnosis of SARS-CoV-2 by FDA under an Emergency Use Authorization (EUA). This EUA will remain in effect (meaning this test can be used) for the duration of the COVID-19 declaration under Section 564(b)(1) of the Act, 21 U.S.C. section 360bbb-3(b)(1), unless the authorization is terminated or revoked.  Performed at [facility], [address] [place], [place] [address]     Labs: CBC: Recent Labs  Lab 01/31/24 0136 01/31/24 1506 02/01/24 0309 02/02/24 0305 02/03/24 0417 02/04/24 0254  WBC 8.2 8.8 9.1 9.1 10.3 19.5*  NEUTROABS 5.5  --   --   --   --   --   HGB 11.2* 11.4* 10.6* 11.6* 11.5* 11.1*  HCT 34.5* 34.0* 31.5* 35.7* 34.2* 33.1*  MCV 95.8 93.9 93.8 93.7 92.7 91.9  PLT 225 234 216 252 231 218   Basic Metabolic Panel: Recent Labs  Lab 01/31/24 0921 01/31/24 1506 02/02/24 0305 02/03/24 0417 02/03/24 0952 02/04/24 0254 02/05/24 0314 02/06/24 0831  NA 140   < > 142 137  --  133* 136 138  K 3.6   < > 3.7 4.0  --  3.5 3.5 3.5  CL 109   < > 107 101  --  99 101 102  CO2 20*   < > 23 23  --  22 22 22   GLUCOSE 108*   < > 113* 196*  --  159* 124* 118*  BUN 37*   < > 48* 61*  --  73* 89* 84*  CREATININE 2.08*   < > 2.99* 3.14*  --  3.25* 3.32* 2.90*  CALCIUM 8.7*   < > 9.2 9.4  --  8.7* 8.5* 8.8*  MG 1.7  --   --   --  1.8  --   --   --   PHOS  --   --  3.7 4.4  --  4.3 3.9  --     < > = values in this interval not displayed.   Liver Function Tests: Recent Labs  Lab 01/31/24 0136 02/02/24 0305 02/03/24 0417 02/04/24 0254 02/05/24 0314  AST 18  --   --   --   --   ALT 13  --   --   --   --  ALKPHOS 73  --   --   --   --   BILITOT 0.6  --   --   --   --   PROT 6.8  --   --   --   --   ALBUMIN 3.0* 3.3* 3.2* 3.0* 2.8*   CBG: No results for input(s): "GLUCAP" in the last 168 hours.  Discharge time spent: greater than 30 minutes.  Signed: Coralie Keens, MD [facility] 02/06/2024

## 2024-02-06 NOTE — Plan of Care (Signed)
  Problem: Education: Goal: Knowledge of General Education information will improve Description: Including pain rating scale, medication(s)/side effects and non-pharmacologic comfort measures Outcome: Progressing   Problem: Health Behavior/Discharge Planning: Goal: Ability to manage health-related needs will improve Outcome: Progressing   Problem: Clinical Measurements: Goal: Ability to maintain clinical measurements within normal limits will improve Outcome: Progressing Goal: Will remain free from infection Outcome: Progressing Goal: Diagnostic test results will improve Outcome: Progressing Goal: Respiratory complications will improve Outcome: Progressing Goal: Cardiovascular complication will be avoided Outcome: Progressing   Problem: Activity: Goal: Risk for activity intolerance will decrease Outcome: Progressing   Problem: Skin Integrity: Goal: Risk for impaired skin integrity will decrease Outcome: Progressing   

## 2024-02-06 NOTE — TOC Transition Note (Signed)
 Transition of Care [facility]) - Discharge Note   Patient Details  Name: [name] MRN: [id number] Date of Birth: Jan 06, 1961  [facility] CM/SW Contact:  Leone Haven, RN Phone Number: 02/06/2024, 12:34 PM   Clinical Narrative:    For dc home today, has transportation, has no needs.   Final next level of care: Home/Self Care Barriers to Discharge: Continued Medical Work up   Patient Goals and CMS Choice Patient states their goals for this hospitalization and ongoing recovery are:: return home   Choice offered to / list presented to : NA      Discharge Placement                       Discharge Plan and Services Additional resources added to the After Visit Summary for   In-house Referral: NA Discharge Planning Services: CM Consult Post Acute Care Choice: NA          DME Arranged: N/A DME Agency: NA       HH Arranged: NA          Social Drivers of Health (SDOH) Interventions SDOH Screenings   Food Insecurity: No Food Insecurity (01/31/2024)  Housing: Low Risk  (01/31/2024)  Transportation Needs: No Transportation Needs (01/31/2024)  Utilities: Not At Risk (01/31/2024)  Alcohol Screen: Low Risk  (01/19/2024)  Financial Resource Strain: Low Risk  (01/19/2024)  Tobacco Use: Low Risk  (01/31/2024)     Readmission Risk Interventions    02/03/2024    2:18 PM 02/02/2024    4:48 PM 01/19/2024    4:08 PM  Readmission Risk Prevention Plan  Medication Screening   Complete  Transportation Screening Complete Complete Complete  PCP or Specialist Appt within 5-7 Days Complete    Home Care Screening Complete Complete   Medication Review (RN CM) Complete Complete

## 2024-02-06 NOTE — Plan of Care (Signed)
  Problem: Education: Goal: Knowledge of General Education information will improve Description: Including pain rating scale, medication(s)/side effects and non-pharmacologic comfort measures 02/06/2024 1231 by Martie Round, RN Outcome: Adequate for Discharge 02/06/2024 1035 by Martie Round, RN Outcome: Progressing   Problem: Health Behavior/Discharge Planning: Goal: Ability to manage health-related needs will improve 02/06/2024 1231 by Martie Round, RN Outcome: Adequate for Discharge 02/06/2024 1035 by Martie Round, RN Outcome: Progressing   Problem: Clinical Measurements: Goal: Ability to maintain clinical measurements within normal limits will improve 02/06/2024 1231 by Martie Round, RN Outcome: Adequate for Discharge 02/06/2024 1035 by Martie Round, RN Outcome: Progressing Goal: Will remain free from infection 02/06/2024 1231 by Martie Round, RN Outcome: Adequate for Discharge 02/06/2024 1035 by Martie Round, RN Outcome: Progressing Goal: Diagnostic test results will improve 02/06/2024 1231 by Martie Round, RN Outcome: Adequate for Discharge 02/06/2024 1035 by Martie Round, RN Outcome: Progressing Goal: Respiratory complications will improve 02/06/2024 1231 by Martie Round, RN Outcome: Adequate for Discharge 02/06/2024 1035 by Martie Round, RN Outcome: Progressing Goal: Cardiovascular complication will be avoided 02/06/2024 1231 by Martie Round, RN Outcome: Adequate for Discharge 02/06/2024 1035 by Martie Round, RN Outcome: Progressing   Problem: Activity: Goal: Risk for activity intolerance will decrease 02/06/2024 1231 by Martie Round, RN Outcome: Adequate for Discharge 02/06/2024 1035 by Martie Round, RN Outcome: Progressing   Problem: Skin Integrity: Goal: Risk for impaired skin integrity will decrease 02/06/2024 1231 by Martie Round, RN Outcome: Adequate for Discharge 02/06/2024 1035 by Martie Round, RN Outcome:  Progressing

## 2024-02-06 NOTE — Progress Notes (Signed)
 Heart Failure Nurse Navigator Progress Note    Patient's original HF TOC appointment on 02/10/2024 was rescheduled to Monday, 02/16/2024 @ 3:30 pm, due to patient is still hospitalized. AVS updated and patient will receive a new appointment card.   Rhae Hammock, BSN, Scientist, clinical (histocompatibility and immunogenetics) Only

## 2024-02-06 NOTE — Progress Notes (Signed)
   Heart Failure Stewardship Pharmacist Progress Note   PCP: Alysia Penna, MD PCP-Cardiologist: Rollene Rotunda, MD    HPI:  63 y.o. female with PMH of CHF, NICM, HTN, CKD (stage IIIb), ischemic stroke, paroxysmal afib, and insomnia. cMRI in 2023 without evidence of amyloid.  Recently admitted 3/14-3/18 with acute on chronic CHF. ECHO with LVEF 20-25%, moderate asymmetric LVH, G1DD, RAP 3. Was diuresed with IV lasix from 3/14-3/16 and was discharged on furosemide 20 mg daily, carvedilol 25 mg BID, hydralazine 25 mg TID + Imdur 30 mg daily.   Followed with HF TOC clinic on 3/24 and reported cough, congestion, and SOB on exertion. Denied chest pain, edema, PND/orthopnea. BP elevated 160/74. She was started on Jardiance 10 mg daily and hydralazine was increased to 37.5 mg TID.   Presented back to the ED on 3/29 with shortness of breath that was sudden onset. Found to have O2 sat of 83% on room air and was placed on nonrebreather. BNP was 459.6. Reports her weights had been stable at home. Optivol interrogation slightly elevated.   Denies shortness of breath, chest pain, orthopnea, lightheadedness, or dizziness. Creatinine improved. Plan discharge today. She does not have a pill box at home. Agreeable to [facility] pharmacy to provide medications at discharge.   Current HF Medications: Beta Blocker: carvedilol 25 mg BID SGLT2i: Jardiance 10 mg daily Other: BiDil 1 tab TID  Prior to admission HF Medications: Diuretic: furosemide 20 mg daily Beta blocker: carvedilol 25 mg BID SGLT2i: Jardiance 10 mg daily Other: hydralazine 37.5 mg TID + Imdur 30 mg daily  Pertinent Lab Values: Serum creatinine 3.32>2.90 (2.08 on 3/29), BUN 84, Potassium 3.5, Sodium 138, BNP 265.8, Magnesium 1.8, A1c 6.1   Vital Signs: Weight: 167 lbs (admission weight: 169 lbs) Blood pressure: 110-120/80s  Heart rate: 70-80s  I/O: net -0.9L yesterday; net -2.3L since admission  Medication Assistance / Insurance Benefits  Check: Does the patient have prescription insurance?  Yes Type of insurance plan: Financial risk analyst  Outpatient Pharmacy:  Prior to admission outpatient pharmacy: [facility] Is the patient willing to use [facility] TOC pharmacy at discharge? Yes Is the patient willing to transition their outpatient pharmacy to utilize a [facility] outpatient pharmacy?   No    Assessment: 1. Acute on chronic systolic CHF (LVEF 20-25%), due to NICM. NYHA class II symptoms. - Holding torsemide 20 mg daily now that she is euvolemic. Creatinine improved today. Consider resuming torsemide 20 mg daily vs furosemide 40 mg daily at discharge.  - Continue carvedilol 25 mg BID - No ACE/ARB/ARNI or MRA with AKI on CKD III - Continue Jardiance 10 mg daily - Continue BiDil 1 tab TID   Plan: 1) Medication changes recommended at this time: - Add torsemide 20 mg daily vs furosemide 40 mg daily at discharge   2) Patient assistance: - None pending - BiDil copay $10  3)  Education  - Patient has been educated on current HF medications and potential additions to HF medication regimen - Patient verbalizes understanding that over the next few months, these medication doses may change and more medications may be added to optimize HF regimen - Patient has been educated on basic disease state pathophysiology and goals of therapy   Sharen Hones, PharmD, BCPS Heart Failure Stewardship Pharmacist Phone [phone]

## 2024-02-06 NOTE — Progress Notes (Signed)
 Reviewed AVS, patient expressed understanding of medications, MD follow up reviewed.   Patient states all belongings brought to the hospital at time of admission are accounted for and packed to take home.  Picked up medications from [facility] pharmacy. Pt transported to entrance A where family member was waiting in vehicle to transport home.

## 2024-02-10 ENCOUNTER — Ambulatory Visit (HOSPITAL_COMMUNITY)

## 2024-02-10 NOTE — Progress Notes (Signed)
 Remote ICD transmission.

## 2024-02-10 NOTE — Addendum Note (Signed)
 Addended by: Geralyn Flash D on: 02/10/2024 03:26 PM   Modules accepted: Orders

## 2024-02-11 NOTE — Progress Notes (Unsigned)
 Cardiology Office Note    Date:  02/16/2024  ID:  [name], DOB [birth year], MRN [id number] PCP:  Barnetta Liberty, MD  Cardiologist:  Eilleen Grates, MD  Electrophysiologist:  Manya Sells, MD   Chief Complaint: Follow up for HFrEF   History of Present Illness: .    [name] is a 63 y.o. female with visit-pertinent history of nonischemic cardiomyopathy, hypertension, stage IIIb CKD, paroxysmal atrial fibrillation on Eliquis, acute left CVA in 2023.  In 2015 she was noted to have EF 25 to 30%, recovered to 50 to 55% in 2018.  In 12/2021 she was admitted for an acute left CVA infarct.  Her follow-up echocardiogram showed an LVEF of 20 to 25%.  Her MRI did not demonstrate a clot source, there is no evidence of previous MI or amyloid.  She was negative for sleep apnea.  She was noted to be COVID-positive.  She was previously trialed on Farxiga but had elevated creatinine following this and this was discontinued.  Her last echocardiogram on 11/14/2022 indicated LVEF of 20 to 25%, LV demonstrating global hypokinesis, LV internal cavity size was severely dilated, G2 DD.  Given persistent reduction in EF she was referred to Dr. Carolynne Citron for consideration of CRT.  She was also referred to nephrology given creatinine 3.0.  She was seen in clinic by Dr. Carolynne Citron on 12/06/2022, patient wished to proceed with ICD insertion.  She was last seen by Dr. Lavonne Prairie on 12/13/2022, she remained stable from a cardiac standpoint.   On 01/03/2023 she underwent BiV ICD insertion with Dr. Carolynne Citron.  On 01/20/2023 after a remote device check it was alerted that she had episodes of atrial fibrillation.  She was followed up with at the A-fib clinic.  She was started on Eliquis 5 mg twice daily.  No medication changes were made, on her follow-up visit at 03/07/2023 her device interrogation showed an A-fib burden of 0%.  She was seen in clinic by Dr. Carolynne Citron on 04/18/2023, she remained stable from a cardiac standpoint.  Patient was seen  in clinic on 12/17/2023.  She remained stable from a cardiac standpoint.  It was recommended that she have a repeat echocardiogram.  Echocardiogram on 01/17/2024 indicated LVEF of 20 to 25%, moderate asymmetric LVH of the inferior segment, grade 1 diastolic dysfunction, no significant changes from prior study.  On 01/16/2024 patient presented to the emergency department with increased shortness of breath.  Patient's BNP was elevated to the 500s.  It was felt that patient had acute hypoxic respiratory failure suspected URI with reactive airway disease as patient had significant upper airway congestion.  Patient also underwent IV diuresis.  Patient was discharged on 01/20/2024 in stable condition.  Patient was seen in heart failure TOC on 01/26/2024, she was started on Jardiance 10 mg daily.  On 01/31/2024 patient presented to [facility] ED for shortness of breath again.  Cardiology was consulted for concern for prolonged episodes of NSVT on telemetry, on review felt longest episode to be AT rather than NSVT that was supported by ICD interrogation.  Patient noted to have frequent PVCs intermittent runs of NSVT, limited to 10-15 beats.  Electrolytes were replaced and VT monitor zone was lowered to 140 bpm per Dr. Daneil Dunker.  Patient underwent IV diuresis, had slight increase in creatinine that returned back to 2.9 on day of discharge.  Patient was discharged on 4//25 in stable condition.  Today she presents for follow-up.  She reports that she is overall going ok, she  continues to have problems with nasal and sinus congestion as well as an ongoing cough.  She notes that she will have coughing fits at home with decreases in oxygen saturation, she will use her albuterol inhaler with improvement in symptoms.  Last used yesterday.  She reports that her weights at home have been stable, she did use Lasix 20 mg once last week to see if it improved her cough, she noted some minor improvements, has not required since.  Patient reports  that her PCP completed labs last week, discussed referral to pulmonology although patient notes that referral was not placed, request today.  Labwork independently reviewed: 02/06/24: Sodium 138, potassium 3.5, creatinine 2.90 02/13/24: Creatinine 2.6, sodium 143, potassium 4.1, calcium 8.9, HGB 11.1, HCT 33.5 ROS: .   Today she denies chest pain, lower extremity edema, fatigue, palpitations, melena, hematuria, hemoptysis, diaphoresis, weakness, presyncope, syncope, orthopnea, and PND.  All other systems are reviewed and otherwise negative. Studies Reviewed: Aaron Aas    EKG:  EKG is not ordered today.  CV Studies: Cardiac studies reviewed are outlined and summarized above. Otherwise please see EMR for full report. Cardiac Studies & Procedures   ______________________________________________________________________________________________     ECHOCARDIOGRAM  ECHOCARDIOGRAM LIMITED 01/17/2024  Narrative ECHOCARDIOGRAM LIMITED REPORT    Patient Name:   [name] [facility] Date of Exam: 01/17/2024 Medical Rec #:  [id number]   Height:       67.0 in Accession #:    [id number]  Weight:       174.0 lb Date of Birth:  May 04, 1961  BSA:          1.906 m Patient Age:    62 years    BP:           137/89 mmHg Patient Gender: F           HR:           80 bpm. Exam Location:  Inpatient  Procedure: Limited Echo and Limited Color Doppler (Both Spectral and Color Flow Doppler were utilized during procedure).  Indications:    CHF - Acute Systolic I50.21  History:        Patient has prior history of Echocardiogram examinations, most recent 01/02/2024. CHF and Cardiomyopathy, Stroke and CKD, stage 4, Arrythmias:Atrial Fibrillation, Signs/Symptoms:Chest Pain and Dyspnea; Risk Factors:Hypertension and Dyslipidemia.  Sonographer:    Terrilee Few RCS Referring Phys: [phone] [name]  IMPRESSIONS   1. Left ventricular ejection fraction, by estimation, is 20 to 25%. The left ventricle has severely  decreased function. There is moderate asymmetric left ventricular hypertrophy of the inferior segment. Left ventricular diastolic parameters are consistent with Grade I diastolic dysfunction (impaired relaxation). 2. Right ventricular systolic function is normal. The right ventricular size is normal. Tricuspid regurgitation signal is inadequate for assessing PA pressure. 3. No evidence of mitral valve regurgitation. 4. The aortic valve was not well visualized. Aortic valve regurgitation is not visualized. 5. The inferior vena cava is normal in size with greater than 50% respiratory variability, suggesting right atrial pressure of 3 mmHg.  Comparison(s): No significant change from prior study.  FINDINGS Left Ventricle: Left ventricular ejection fraction, by estimation, is 20 to 25%. The left ventricle has severely decreased function. There is moderate asymmetric left ventricular hypertrophy of the inferior segment. Left ventricular diastolic parameters are consistent with Grade I diastolic dysfunction (impaired relaxation).  Right Ventricle: The right ventricular size is normal. Right ventricular systolic function is normal. Tricuspid regurgitation signal is inadequate for assessing PA pressure.  Left Atrium: Left atrial size was normal in size.  Right Atrium: Right atrial size was normal in size.  Pericardium: There is no evidence of pericardial effusion.  Tricuspid Valve: Tricuspid valve regurgitation is trivial.  Aortic Valve: The aortic valve was not well visualized. Aortic valve regurgitation is not visualized. Aortic valve peak gradient measures 11.6 mmHg.  Venous: The inferior vena cava is normal in size with greater than 50% respiratory variability, suggesting right atrial pressure of 3 mmHg.  Additional Comments: A device lead is visualized.  LEFT VENTRICLE PLAX 2D LVIDd:         4.50 cm LVIDs:         4.00 cm LV PW:         1.40 cm LV IVS:        1.10 cm LVOT diam:      2.00 cm LV SV:         48 LV SV Index:   25 LVOT Area:     3.14 cm  LV Volumes (MOD) LV vol d, MOD A2C: 191.0 ml LV vol d, MOD A4C: 187.0 ml LV vol s, MOD A2C: 135.0 ml LV vol s, MOD A4C: 149.0 ml LV SV MOD A2C:     56.0 ml LV SV MOD A4C:     187.0 ml LV SV MOD BP:      48.2 ml  IVC IVC diam: 1.90 cm  LEFT ATRIUM         Index LA diam:    3.70 cm 1.94 cm/m AORTIC VALVE AV Area (Vmax): 1.91 cm AV Vmax:        170.00 cm/s AV Peak Grad:   11.6 mmHg LVOT Vmax:      103.43 cm/s LVOT Vmean:     62.700 cm/s LVOT VTI:       0.153 m  AORTA Ao Root diam: 3.40 cm Ao Asc diam:  3.70 cm  MITRAL VALVE MV Area (PHT): 4.53 cm     SHUNTS MV Decel Time: 168 msec     Systemic VTI:  0.15 m MV E velocity: 67.70 cm/s   Systemic Diam: 2.00 cm MV A velocity: 124.00 cm/s MV E/A ratio:  0.55  [place] signed by Alois Arnt Signature Date/Time: 01/17/2024/12:25:01 PM    Final        CARDIAC MRI  MR CARDIAC MORPHOLOGY W WO CONTRAST 12/17/2021  Narrative CLINICAL DATA:  Cardiomyopathy,?LV thrombus  EXAM: CARDIAC MRI  TECHNIQUE: The patient was scanned on a 1.5 Tesla GE magnet. A dedicated cardiac coil was used. Functional imaging was done using Fiesta sequences. 2,3, and 4 chamber views were done to assess for RWMA's. Modified Simpson's rule using a short axis stack was used to calculate an ejection fraction on a dedicated work [employer]. The patient received 10 cc of Gadavist. After 10 minutes inversion recovery sequences were used to assess for infiltration and scar tissue.  CONTRAST:  Gadavist 10 cc  FINDINGS: Limited images of the lung fields showed no gross abnormalities.  Moderate left ventricular dilation with normal wall thickness. Diffuse severe hypokinesis with septal-lateral dyssynchrony consistent with LBBB, LV EF 20%. No LV thrombus noted. Normal right ventricular size with low normal to mildly decreased  systolic function, RV EF 44%. Mild left atrial enlargement. Normal right atrium. Trivial mitral regurgitation. Trileaflet aortic valve with no stenosis or regurgitation.  Delayed enhancement imaging: In one view, there was a small area of subendocardial late gadolinium enhancement (LGE) at the apex,  but this could not be reproduced in other views so may have been artifactual.  MEASUREMENTS: MEASUREMENTS LVEDV 306 mL LVSV 60 mL LVEF 20%  RVEDV 137 mL RVSV 60 mL RVEF 44%  T1 1129, ECV 31%  IMPRESSION: 1. Moderate LV dilation with diffuse severe LV hypokinesis and prominent septal-lateral dyssynchrony consistent with LBBB. EF 20%.  2.  No LV thrombus.  3. Normal RV size with low normal to mildly decreased systolic function, EF 44%.  4. Possible very small area of subendocardial LGE at the apex but may be artifactual. Overall, no evidence for large prior MI, myocarditis, or infiltrative disease like amyloidosis.  5. Mildly elevated extracellular volume percentage in the septum, this is likely nonspecific.  With minimal to no LV scarring and prominent septal-lateral dyssynchrony, this patient would likely be good CRT candidate.  Dalton Mclean   Electronically Signed By: Peder Bourdon M.D. On: 12/17/2021 15:31   ______________________________________________________________________________________________       Current Reported Medications:.    Current Meds  Medication Sig   albuterol (VENTOLIN HFA) 108 (90 Base) MCG/ACT inhaler Inhale 1 puff into the lungs every 6 (six) hours as needed.   apixaban (ELIQUIS) 5 MG TABS tablet TAKE 1 TABLET(5 MG) BY MOUTH TWICE DAILY   atorvastatin (LIPITOR) 80 MG tablet Take 1 tablet (80 mg total) by mouth daily.   benzonatate (TESSALON) 200 MG capsule Take 200 mg by mouth 3 (three) times daily as needed.   carvedilol (COREG) 25 MG tablet Take 1 tablet (25 mg total) by mouth 2 (two) times daily with a meal.   empagliflozin  (JARDIANCE) 10 MG TABS tablet Take 1 tablet (10 mg total) by mouth daily before breakfast.   isosorbide-hydrALAZINE (BIDIL) 20-37.5 MG tablet Take 1 tablet by mouth 3 (three) times daily.   zolpidem (AMBIEN) 5 MG tablet Take 5 mg by mouth at bedtime as needed for sleep.   Physical Exam:    VS:  BP 120/76 (BP Location: Left Arm, Patient Position: Sitting, Cuff Size: Normal)   Pulse 91   Ht 5\' 7"  (1.702 m)   Wt 170 lb 6.4 oz (77.3 kg)   SpO2 95%   BMI 26.69 kg/m    Wt Readings from Last 3 Encounters:  02/16/24 171 lb 9.6 oz (77.8 kg)  02/16/24 170 lb 6.4 oz (77.3 kg)  02/06/24 167 lb 5.3 oz (75.9 kg)    GEN: Well nourished, well developed in no acute distress NECK: No JVD; No carotid bruits CARDIAC: RRR, no murmurs, rubs, gallops RESPIRATORY:  Clear to auscultation without rales, wheezing or rhonchi  ABDOMEN: Soft, non-tender, non-distended EXTREMITIES:  No edema; No acute deformity     Asessement and Plan:.    HFrEF/nonischemic cardiomyopathy: Cardiac MRI in 12/2021 indicated LVEF 20%, RVEF 44%, prominent septal lateral desynchronous E, no evidence for large prior MI, myocarditis or infiltrative disease such as amyloidosis. Echocardiogram on 01/17/2024 indicated LVEF of 20 to 25%, moderate asymmetric LVH of the inferior segment, grade 1 diastolic dysfunction, no significant changes from prior study.  S/p MDT BiV ICD.  Patient with 2 hospitalizations in the last 2 months associated with acute on chronic heart failure and upper respiratory infections.  Today patient reports that she is doing well, feels improved, reports stable weights at home, denies lower extremity edema.  Discussed with patient need to continue monitoring daily weights, to notify the office of weight gain of 2 to 3 pounds overnight or 5 pounds in a week, increased lower extremity edema or increased  shortness of breath.  Continue carvedilol 25 mg twice daily, Jardiance 10 mg daily, Lasix as needed and BiDil 20-37.5 mg 3 times  daily.  Reviewed ED precautions.  PAF: Patient previously found to have episodes of atrial fibrillation on device interrogation.  Patient on Eliquis 5 mg twice daily, denies any bleeding problems. Today she reports that she has not had any increased palpitations or feeling of faster heart rates.  Continue Eliquis 5 mg twice daily and carvedilol 25 mg twice daily.  Hyperactive airway disease: Patient admitted in 01/2024 with heart failure exacerbation and upper respiratory infection, felt to have hyperactive airways disease.  Patient notes that she has had problems with a chronic cough for the last 6 months as well as increased nasal and sinus congestion, this resulted in drainage and coughing.  Patient denies any wheezing however notes that her family has commented she sounds as though she is wheezing on occasion.  Will refer to pulmonology as patient notes significant improvement with albuterol, may benefit from PFTs, patient appreciative of referral.  HTN: Blood pressure today 120/76. Continue current antihypertensive regimen.   CKD: Baseline creatinine 2.4-2.6.  Last creatinine 2.6 on 02/13/2024, taken by her PCP. Patient has been referred to [facility], she is planning to call the office after her appointment today to schedule her appointment.  History of CVA: Patient was hospitalized in 12/2021, MRI did not demonstrate clot source.  On Eliquis and atorvastatin 80 mg daily.  Dyslipidemia: Patient does not have a recent lipid profile available for review.  She is on atorvastatin 80 mg daily.  Check fasting lipid profile and LFTs.    Disposition: F/u with Dr. Lavonne Prairie in 03/2024 as scheduled.   Signed, Arnaldo Heffron D Lexia Vandevender, NP

## 2024-02-13 ENCOUNTER — Telehealth (HOSPITAL_COMMUNITY): Payer: Self-pay

## 2024-02-13 DIAGNOSIS — J4521 Mild intermittent asthma with (acute) exacerbation: Secondary | ICD-10-CM | POA: Diagnosis not present

## 2024-02-13 DIAGNOSIS — I130 Hypertensive heart and chronic kidney disease with heart failure and stage 1 through stage 4 chronic kidney disease, or unspecified chronic kidney disease: Secondary | ICD-10-CM | POA: Diagnosis not present

## 2024-02-13 DIAGNOSIS — N184 Chronic kidney disease, stage 4 (severe): Secondary | ICD-10-CM | POA: Diagnosis not present

## 2024-02-13 NOTE — Telephone Encounter (Signed)
 Called to confirm/remind patient of their appointment at the [facility] on 02/16/2024 3:30.   Appointment:   [] Confirmed  [x] Left mess   [] No answer/No voice mail  [] Phone not in service  Patient reminded to bring all medications and/or complete list.  Confirmed patient has transportation. Gave directions, instructed to utilize valet parking.

## 2024-02-16 ENCOUNTER — Encounter: Payer: Self-pay | Admitting: Cardiology

## 2024-02-16 ENCOUNTER — Ambulatory Visit
Admit: 2024-02-16 | Discharge: 2024-02-16 | Disposition: A | Discharge status: Home / Self Care | Source: Ambulatory Visit | Attending: Cardiology | Admitting: Cardiology

## 2024-02-16 ENCOUNTER — Ambulatory Visit (INDEPENDENT_AMBULATORY_CARE_PROVIDER_SITE_OTHER): Admitting: Cardiology

## 2024-02-16 VITALS — BP 162/110 | HR 92 | Wt 171.6 lb

## 2024-02-16 VITALS — BP 120/76 | HR 91 | Ht 67.0 in | Wt 170.4 lb

## 2024-02-16 DIAGNOSIS — R059 Cough, unspecified: Secondary | ICD-10-CM | POA: Insufficient documentation

## 2024-02-16 DIAGNOSIS — Z7901 Long term (current) use of anticoagulants: Secondary | ICD-10-CM | POA: Diagnosis not present

## 2024-02-16 DIAGNOSIS — Z79899 Other long term (current) drug therapy: Secondary | ICD-10-CM | POA: Diagnosis not present

## 2024-02-16 DIAGNOSIS — I480 Paroxysmal atrial fibrillation: Secondary | ICD-10-CM | POA: Insufficient documentation

## 2024-02-16 DIAGNOSIS — N184 Chronic kidney disease, stage 4 (severe): Secondary | ICD-10-CM | POA: Diagnosis not present

## 2024-02-16 DIAGNOSIS — I428 Other cardiomyopathies: Secondary | ICD-10-CM | POA: Insufficient documentation

## 2024-02-16 DIAGNOSIS — E785 Hyperlipidemia, unspecified: Secondary | ICD-10-CM | POA: Diagnosis not present

## 2024-02-16 DIAGNOSIS — J45909 Unspecified asthma, uncomplicated: Secondary | ICD-10-CM | POA: Insufficient documentation

## 2024-02-16 DIAGNOSIS — I130 Hypertensive heart and chronic kidney disease with heart failure and stage 1 through stage 4 chronic kidney disease, or unspecified chronic kidney disease: Secondary | ICD-10-CM | POA: Diagnosis not present

## 2024-02-16 DIAGNOSIS — Z8673 Personal history of transient ischemic attack (TIA), and cerebral infarction without residual deficits: Secondary | ICD-10-CM | POA: Diagnosis not present

## 2024-02-16 DIAGNOSIS — I639 Cerebral infarction, unspecified: Secondary | ICD-10-CM

## 2024-02-16 DIAGNOSIS — N1832 Chronic kidney disease, stage 3b: Secondary | ICD-10-CM | POA: Diagnosis not present

## 2024-02-16 DIAGNOSIS — Z95810 Presence of automatic (implantable) cardiac defibrillator: Secondary | ICD-10-CM | POA: Insufficient documentation

## 2024-02-16 DIAGNOSIS — I5022 Chronic systolic (congestive) heart failure: Secondary | ICD-10-CM | POA: Insufficient documentation

## 2024-02-16 DIAGNOSIS — I5020 Unspecified systolic (congestive) heart failure: Secondary | ICD-10-CM

## 2024-02-16 DIAGNOSIS — I10 Essential (primary) hypertension: Secondary | ICD-10-CM

## 2024-02-16 NOTE — Progress Notes (Signed)
 HEART & VASCULAR TRANSITION OF CARE CONSULT NOTE     Referring Physician:Dr Arrien Alysia Penna, MD  Cardiology:Dr Hochrein. Nephrology: [facility]   Chief Complaint: Heart Failure   HPI: Referred to clinic by Dr Ella Jubilee for heart failure consultation.   [name] is a 63 y.o. female chronic HFrEF, BiV, PAF, CVA, and CKD Stage IIIb. Cath 2006 with normal coronaries. Diagnosed with HFrEF back in 2006 and EF had improved to 50-55% in 2015.  Unfortunately EF went back down in 2023 after stroke with EF down to 25-30%.  Admitted 01/2024 with A/C HFrEF. Diuresed with IV lasix. She was seen in HF [facility] clinic and was stable at that time.   Readmitted 01/31/24 with respiratory failure and heart failure exacerbation. Diuresed with IV lasix and transitioned to lasix as needed. GDMT adjusted and limited due to CKD Stage IIIIB. Discharge weight 167 pounds.   Today she returns for HF follow up.Overall feeling fine. Complaining cough. Cough has been going on for 6 months. Denies SOB/PND/Orthopnea. Appetite ok. No fever or chills. Weight at home has been stable 165-166 pounds.   SBP at home 120s DBP 70s. Taking all medications. She takes lasix as needed. She took 20 mg lasix last week and had a great response. 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   Cardiac Testing  Echo 01/2024   1. Left ventricular ejection fraction, by estimation, is 20 to 25%. The  left ventricle has severely decreased function. There is moderate  asymmetric left ventricular hypertrophy of the inferior segment. Left  ventricular diastolic parameters are  consistent with Grade I diastolic dysfunction (impaired relaxation).   2. Right ventricular systolic function is normal. The right ventricular  size is normal. Tricuspid regurgitation signal is inadequate for assessing  PA pressure.   3. No evidence of mitral valve regurgitation.   4. The aortic valve was not well visualized. Aortic valve  regurgitation  is not visualized.   5. The inferior vena cava is normal in size with greater than 50%  respiratory variability, suggesting right atrial pressure of 3 mmHg.    Past Medical History:  Diagnosis Date   CHF (congestive heart failure) (HCC)    Hypertension    Overweight(278.02)    Sciatica    Secondary cardiomyopathy, unspecified    EF now 50%    Current Outpatient Medications  Medication Sig Dispense Refill   Acetaminophen (TYLENOL PO) Take 1,000 mg by mouth daily as needed (for pain).     albuterol (VENTOLIN HFA) 108 (90 Base) MCG/ACT inhaler Inhale 1 puff into the lungs every 6 (six) hours as needed.     apixaban (ELIQUIS) 5 MG TABS tablet TAKE 1 TABLET(5 MG) BY MOUTH TWICE DAILY 60 tablet 1   atorvastatin (LIPITOR) 80 MG tablet Take 1 tablet (80 mg total) by mouth daily. 30 tablet 1   benzonatate (TESSALON) 200 MG capsule Take 200 mg by mouth 3 (three) times daily as needed.     carvedilol (COREG) 25 MG tablet Take 1 tablet (25 mg total) by mouth 2 (two) times daily with a meal. 180 tablet 3   empagliflozin (JARDIANCE) 10 MG TABS tablet Take 1 tablet (10 mg total) by mouth daily before breakfast. 30 tablet 5   furosemide (LASIX) 20 MG tablet Take 2 tablets (40 mg total) by mouth daily. Take in case of weight gain 2 to 3 lbs in 24 hrs or 5 lbs in 7 days until weight back  to baseline 30 tablet 1   HYDROcodone-acetaminophen (NORCO/VICODIN) 5-325 MG tablet Take 1 tablet by mouth daily as needed for severe pain (pain score 7-10).     isosorbide-hydrALAZINE (BIDIL) 20-37.5 MG tablet Take 1 tablet by mouth 3 (three) times daily. 90 tablet 0   zolpidem (AMBIEN) 5 MG tablet Take 5 mg by mouth at bedtime as needed for sleep.  1   No current facility-administered medications for this encounter.    Allergies  Allergen Reactions   Latex Itching   Chlorhexidine Gluconate Hives    Unknown if this is the cause of the hives, raised areas around lab draw sites. No tape and area  wiped with chlorhexidine.      Social History   Socioeconomic History   Marital status: Single    Spouse name: Not on file   Number of children: 3   Years of education: Not on file   Highest education level: High school graduate  Occupational History   Not on file  Tobacco Use   Smoking status: Never   Smokeless tobacco: Never   Tobacco comments:    Never smoke 03/07/23  Vaping Use   Vaping status: Never Used  Substance and Sexual Activity   Alcohol use: Yes    Alcohol/week: 7.0 - 14.0 standard drinks of alcohol    Types: 7 - 14 Cans of beer per week    Comment: 1-2 beers daily 03/07/23   Drug use: No   Sexual activity: Yes    Birth control/protection: Surgical  Other Topics Concern   Not on file  Social History Narrative   Not on file   Social Drivers of Health   Financial Resource Strain: Low Risk  (01/19/2024)   Overall Financial Resource Strain (CARDIA)    Difficulty of Paying Living Expenses: Not hard at all  Food Insecurity: No Food Insecurity (01/31/2024)   Hunger Vital Sign    Worried About Running Out of Food in the Last Year: Never true    Ran Out of Food in the Last Year: Never true  Transportation Needs: No Transportation Needs (01/31/2024)   PRAPARE - Administrator, Civil Service (Medical): No    Lack of Transportation (Non-Medical): No  Physical Activity: Not on file  Stress: Not on file  Social Connections: Not on file  Intimate Partner Violence: Not At Risk (01/31/2024)   Humiliation, Afraid, Rape, and Kick questionnaire    Fear of Current or Ex-Partner: No    Emotionally Abused: No    Physically Abused: No    Sexually Abused: No      Family History  Problem Relation Age of Onset   Cerebral aneurysm Mother 76       ruptured   Hypertension Mother        severe   Heart attack Father 34   Coronary artery disease Other    Hypertension Brother        severe   Colon cancer Neg Hx    Breast cancer Neg Hx     Vitals:   02/16/24  1536  BP: (!) 162/110  Pulse: 92  SpO2: 94%  Weight: 77.8 kg (171 lb 9.6 oz)   Wt Readings from Last 3 Encounters:  02/16/24 77.8 kg (171 lb 9.6 oz)  02/16/24 77.3 kg (170 lb 6.4 oz)  02/06/24 75.9 kg (167 lb 5.3 oz)     PHYSICAL EXAM: General:   No resp difficulty Neck: supple. no JVD.  Cor: PMI nondisplaced. Regular rate &  rhythm. No rubs, gallops or murmurs. Lungs: clear Abdomen: soft, nontender, nondistended.  Extremities: no cyanosis, clubbing, rash, edema Neuro: alert & oriented x3  ECG: A sends V paced    ASSESSMENT & PLAN: 1. Chronic HFrEF Echo LVEF has been 20-25% for several years.   2nd hospitalization over the last 30 days.  NYHA II. ReDs reading: 33 %, normal. Looks good today. GDMT  Diuretic-Volume status stable. Continue lasix as need.  BB- Continue coreg 25 mg twice a day  Ace/ARB/ARNI- No CKD Stage IV.  MRA- No CKD Stage IV SGLT2i- Continue jardiance 10 mg daily   2. Cough Referred to Pulmonary   3. CKD Stage IV She has follow up with [facility] tomorrow.   She does not take NSAIDs.   Referred to HFSW (PCP, Medications, Transportation, ETOH Abuse, Drug Abuse, Insurance, Financial ): No Refer to Pharmacy: No Refer to Home Health: Yes on No Refer to Advanced Heart Failure Clinic: No  esRefer to General Cardiology: Back to Dr Lavonne Prairie  Follow up as  needed. Back to Dr Lavonne Prairie for Cardiology follow up.   Julisa Flippo Np-C  4:03 PM

## 2024-02-16 NOTE — Patient Instructions (Signed)
 Medication Instructions:  No changes *If you need a refill on your cardiac medications before your next appointment, please call your pharmacy*  Lab Work: We are going to need in the near future Fasting LFT and Lipid panel If you have labs (blood work) drawn today and your tests are completely normal, you will receive your results only by: MyChart Message (if you have MyChart) OR A paper copy in the mail If you have any lab test that is abnormal or we need to change your treatment, we will call you to review the results.  Testing/Procedures: No testing  Follow-Up: At [facility], you and your health needs are our priority.  As part of our continuing mission to provide you with exceptional heart care, our providers are all part of one team.  This team includes your primary Cardiologist (physician) and Advanced Practice Providers or APPs (Physician Assistants and Nurse Practitioners) who all work together to provide you with the care you need, when you need it.  Your next appointment:   Already scheduled  We recommend signing up for the patient portal called "MyChart".  Sign up information is provided on this After Visit Summary.  MyChart is used to connect with patients for Virtual Visits (Telemedicine).  Patients are able to view lab/test results, encounter notes, upcoming appointments, etc.  Non-urgent messages can be sent to your provider as well.   To learn more about what you can do with MyChart, go to [email or web address].   Other Instructions:   1st Floor: - Lobby - Registration  - Pharmacy  - Lab - Cafe  2nd Floor: - PV Lab - Diagnostic Testing (echo, CT, nuclear med)  3rd Floor: - Vacant  4th Floor: - TCTS (cardiothoracic surgery) - AFib Clinic - Structural Heart Clinic - Vascular Surgery  - Vascular Ultrasound  5th Floor: - HeartCare Cardiology (general and EP) - Clinical Pharmacy for coumadin, hypertension, lipid, weight-loss medications, and  med management appointments    Valet parking services will be available as well.

## 2024-02-16 NOTE — Patient Instructions (Signed)
 Thank you for allowing us  to provide your heart failure care after your recent hospitalization. Please follow-up with General Cardiology

## 2024-02-17 DIAGNOSIS — N179 Acute kidney failure, unspecified: Secondary | ICD-10-CM | POA: Diagnosis not present

## 2024-02-17 DIAGNOSIS — D631 Anemia in chronic kidney disease: Secondary | ICD-10-CM | POA: Diagnosis not present

## 2024-02-17 DIAGNOSIS — I129 Hypertensive chronic kidney disease with stage 1 through stage 4 chronic kidney disease, or unspecified chronic kidney disease: Secondary | ICD-10-CM | POA: Diagnosis not present

## 2024-02-17 DIAGNOSIS — N184 Chronic kidney disease, stage 4 (severe): Secondary | ICD-10-CM | POA: Diagnosis not present

## 2024-02-18 ENCOUNTER — Ambulatory Visit (INDEPENDENT_AMBULATORY_CARE_PROVIDER_SITE_OTHER): Admitting: Podiatry

## 2024-02-18 DIAGNOSIS — M62461 Contracture of muscle, right lower leg: Secondary | ICD-10-CM | POA: Diagnosis not present

## 2024-02-18 DIAGNOSIS — M62462 Contracture of muscle, left lower leg: Secondary | ICD-10-CM

## 2024-02-18 DIAGNOSIS — M722 Plantar fascial fibromatosis: Secondary | ICD-10-CM

## 2024-02-18 NOTE — Progress Notes (Signed)
 Subjective:  Patient ID: [name], female    DOB: [birth year],  MRN: [id number]  Chief Complaint  Patient presents with   Plantar Fasciitis    Pt stated that she has a history of plantar fascitis and it is starting to bother her again     63 y.o. female presents with the above complaint. Patient presents with bilateral plantar fasciitis pain. Patient is worse with emulations or pressure taking. In the morning is very painful. She states the pain is 7/10 hearts with ambulation her suit taking first step in the morning. She experiences post astaticdyskinesia symptoms   Review of Systems: Negative except as noted in the HPI. Denies N/V/F/Ch.  Past Medical History:  Diagnosis Date   CHF (congestive heart failure) (HCC)    Hypertension    Overweight(278.02)    Sciatica    Secondary cardiomyopathy, unspecified    EF now 50%    Current Outpatient Medications:    Acetaminophen (TYLENOL PO), Take 1,000 mg by mouth daily as needed (for pain)., Disp: , Rfl:    albuterol (VENTOLIN HFA) 108 (90 Base) MCG/ACT inhaler, Inhale 1 puff into the lungs every 6 (six) hours as needed., Disp: , Rfl:    apixaban (ELIQUIS) 5 MG TABS tablet, TAKE 1 TABLET(5 MG) BY MOUTH TWICE DAILY, Disp: 60 tablet, Rfl: 1   atorvastatin (LIPITOR) 80 MG tablet, Take 1 tablet (80 mg total) by mouth daily., Disp: 30 tablet, Rfl: 1   benzonatate (TESSALON) 200 MG capsule, Take 200 mg by mouth 3 (three) times daily as needed., Disp: , Rfl:    carvedilol (COREG) 25 MG tablet, Take 1 tablet (25 mg total) by mouth 2 (two) times daily with a meal., Disp: 180 tablet, Rfl: 3   empagliflozin (JARDIANCE) 10 MG TABS tablet, Take 1 tablet (10 mg total) by mouth daily before breakfast., Disp: 30 tablet, Rfl: 5   furosemide (LASIX) 20 MG tablet, Take 2 tablets (40 mg total) by mouth daily. Take in case of weight gain 2 to 3 lbs in 24 hrs or 5 lbs in 7 days until weight back to baseline, Disp: 30 tablet, Rfl: 1   HYDROcodone-acetaminophen  (NORCO/VICODIN) 5-325 MG tablet, Take 1 tablet by mouth daily as needed for severe pain (pain score 7-10)., Disp: , Rfl:    isosorbide-hydrALAZINE (BIDIL) 20-37.5 MG tablet, Take 1 tablet by mouth 3 (three) times daily., Disp: 90 tablet, Rfl: 0   zolpidem (AMBIEN) 5 MG tablet, Take 5 mg by mouth at bedtime as needed for sleep., Disp: , Rfl: 1  Social History   Tobacco Use  Smoking Status Never  Smokeless Tobacco Never  Tobacco Comments   Never smoke 03/07/23    Allergies  Allergen Reactions   Latex Itching   Chlorhexidine Gluconate Hives    Unknown if this is the cause of the hives, raised areas around lab draw sites. No tape and area wiped with chlorhexidine.   Objective:  There were no vitals filed for this visit. There is no height or weight on file to calculate BMI. Constitutional Well developed. Well nourished.  Vascular Dorsalis pedis pulses palpable bilaterally. Posterior tibial pulses palpable bilaterally. Capillary refill normal to all digits.  No cyanosis or clubbing noted. Pedal hair growth normal.  Neurologic Normal speech. Oriented to person, place, and time. Epicritic sensation to light touch grossly present bilaterally.  Dermatologic Nails well groomed and normal in appearance. No open wounds. No skin lesions.  Orthopedic: Normal joint ROM without pain or crepitus bilaterally. No  visible deformities. Tender to palpation at the calcaneal tuber bilaterally. No pain with calcaneal squeeze bilaterally. Ankle ROM diminished range of motion bilaterally. Silfverskiold Test: positive bilaterally.   Radiographs: None  Assessment:   1. Plantar fasciitis, right   2. Plantar fasciitis, left   3. Gastrocnemius equinus of both lower extremities    Plan:  Patient was evaluated and treated and all questions answered.  Plantar Fasciitis, bilaterally - XR reviewed as above.  - Educated on icing and stretching. Instructions given.  - Injection delivered to the  plantar fascia as below. - DME: Plantar fascial brace dispensed to support the medial longitudinal arch of the foot and offload pressure from the heel and prevent arch collapse during weightbearing - Pharmacologic management: None  Procedure: Injection Tendon/Ligament Location: Bilateral plantar fascia at the glabrous junction; medial approach. Skin Prep: alcohol Injectate: 0.5 cc 0.5% marcaine plain, 0.5 cc of 1% Lidocaine, 0.5 cc kenalog 10. Disposition: Patient tolerated procedure well. Injection site dressed with a band-aid.  No follow-ups on file.

## 2024-03-06 ENCOUNTER — Other Ambulatory Visit (HOSPITAL_COMMUNITY): Payer: Self-pay | Admitting: Internal Medicine

## 2024-03-08 ENCOUNTER — Ambulatory Visit (INDEPENDENT_AMBULATORY_CARE_PROVIDER_SITE_OTHER)

## 2024-03-08 DIAGNOSIS — I5022 Chronic systolic (congestive) heart failure: Secondary | ICD-10-CM

## 2024-03-08 DIAGNOSIS — I428 Other cardiomyopathies: Secondary | ICD-10-CM

## 2024-03-08 NOTE — Telephone Encounter (Signed)
 Pt last saw Katlyn West, NP on 02/16/24, last labs 02/06/24 Creat 2.90, age 63, weight 77.8kg, based on specified criteria pt is on appropriate dosage of Eliquis  5mg  BID for afib.  Will refill rx.

## 2024-03-09 ENCOUNTER — Telehealth: Payer: Self-pay

## 2024-03-09 DIAGNOSIS — I5022 Chronic systolic (congestive) heart failure: Secondary | ICD-10-CM

## 2024-03-09 DIAGNOSIS — I5020 Unspecified systolic (congestive) heart failure: Secondary | ICD-10-CM

## 2024-03-09 DIAGNOSIS — N184 Chronic kidney disease, stage 4 (severe): Secondary | ICD-10-CM

## 2024-03-09 DIAGNOSIS — I428 Other cardiomyopathies: Secondary | ICD-10-CM

## 2024-03-09 DIAGNOSIS — I480 Paroxysmal atrial fibrillation: Secondary | ICD-10-CM

## 2024-03-09 LAB — CUP PACEART REMOTE DEVICE CHECK
Battery Remaining Longevity: 110 mo
Battery Voltage: 2.99 V
Brady Statistic RV Percent Paced: 1.9 %
Date Time Interrogation Session: 20250505045332
HighPow Impedance: 55 Ohm
Implantable Lead Connection Status: 753985
Implantable Lead Connection Status: 753985
Implantable Lead Connection Status: 753985
Implantable Lead Implant Date: 20240301
Implantable Lead Implant Date: 20240301
Implantable Lead Implant Date: 20240301
Implantable Lead Location: 753858
Implantable Lead Location: 753859
Implantable Lead Location: 753860
Implantable Lead Model: 4598
Implantable Lead Model: 5076
Implantable Lead Model: 6935
Implantable Pulse Generator Implant Date: 20240301
Lead Channel Impedance Value: 285 Ohm
Lead Channel Impedance Value: 323 Ohm
Lead Channel Impedance Value: 323 Ohm
Lead Channel Impedance Value: 342 Ohm
Lead Channel Impedance Value: 342 Ohm
Lead Channel Impedance Value: 361 Ohm
Lead Channel Impedance Value: 456 Ohm
Lead Channel Impedance Value: 475 Ohm
Lead Channel Impedance Value: 570 Ohm
Lead Channel Impedance Value: 589 Ohm
Lead Channel Impedance Value: 608 Ohm
Lead Channel Impedance Value: 627 Ohm
Lead Channel Impedance Value: 665 Ohm
Lead Channel Pacing Threshold Amplitude: 0.75 V
Lead Channel Pacing Threshold Amplitude: 1 V
Lead Channel Pacing Threshold Amplitude: 1.125 V
Lead Channel Pacing Threshold Pulse Width: 0.4 ms
Lead Channel Pacing Threshold Pulse Width: 0.4 ms
Lead Channel Pacing Threshold Pulse Width: 0.4 ms
Lead Channel Sensing Intrinsic Amplitude: 20.1 mV
Lead Channel Sensing Intrinsic Amplitude: 3.4 mV
Lead Channel Setting Pacing Amplitude: 1.5 V
Lead Channel Setting Pacing Amplitude: 1.75 V
Lead Channel Setting Pacing Amplitude: 2 V
Lead Channel Setting Pacing Pulse Width: 0.4 ms
Lead Channel Setting Pacing Pulse Width: 0.4 ms
Lead Channel Setting Sensing Sensitivity: 0.3 mV
Zone Setting Status: 755011
Zone Setting Status: 755011

## 2024-03-09 NOTE — Telephone Encounter (Signed)
 Called patient phone went right to voicemail no way to leave message Reached out to daughter she will try to reach out to her mother again but seems that she is at work and set to do not disturb at the moment.

## 2024-03-09 NOTE — Telephone Encounter (Signed)
 Scheduled remote transmission to evaluate HF diagnostics which continues to be elevated.   Patient had recent OV w/ Katlyn, NP 02/16/24 in which patient was seen post hospital f/u for HFrEF. Noted pt has lasix  on MAR prn. Routing since had recent OV and diagnostics reflect possible increased fluid volume increase.   Pt has f/u 03/25/24 w/ gen cards.

## 2024-03-09 NOTE — Telephone Encounter (Signed)
This pt is returning call.

## 2024-03-09 NOTE — Telephone Encounter (Signed)
 Called patient back Patient does feel that they are holding onto extra fluid has noted that their knee is swollen but not the lower legs, Patient is not having an increased shortness of breath. Patient weighted themselves on Saturday noting that they were at 168 lbs they took a lasix  that day. Their weight had gone down to 165 lbs. Discussed with Katlyn west NP, Katyln advised that patient should take an additional; dose of lasix  today take 40 mg tomorrow and have a Bmet and BNP drawn, then on Thursday morning she soul take another 40 mg's. Hopefully BNP comes back and we can advise patient further on Thursday. Will hold onto this message to follow up with patient.

## 2024-03-10 ENCOUNTER — Encounter: Payer: Self-pay | Admitting: Internal Medicine

## 2024-03-10 DIAGNOSIS — N184 Chronic kidney disease, stage 4 (severe): Secondary | ICD-10-CM | POA: Diagnosis not present

## 2024-03-10 DIAGNOSIS — I5020 Unspecified systolic (congestive) heart failure: Secondary | ICD-10-CM | POA: Diagnosis not present

## 2024-03-10 DIAGNOSIS — I428 Other cardiomyopathies: Secondary | ICD-10-CM | POA: Diagnosis not present

## 2024-03-10 DIAGNOSIS — I5022 Chronic systolic (congestive) heart failure: Secondary | ICD-10-CM | POA: Diagnosis not present

## 2024-03-11 LAB — BASIC METABOLIC PANEL WITH GFR
BUN/Creatinine Ratio: 18 (ref 12–28)
BUN: 36 mg/dL — ABNORMAL HIGH (ref 8–27)
CO2: 21 mmol/L (ref 20–29)
Calcium: 9.3 mg/dL (ref 8.7–10.3)
Chloride: 103 mmol/L (ref 96–106)
Creatinine, Ser: 2.02 mg/dL — ABNORMAL HIGH (ref 0.57–1.00)
Glucose: 100 mg/dL — ABNORMAL HIGH (ref 70–99)
Potassium: 3.7 mmol/L (ref 3.5–5.2)
Sodium: 142 mmol/L (ref 134–144)
eGFR: 28 mL/min/{1.73_m2} — ABNORMAL LOW (ref >59)

## 2024-03-11 LAB — BRAIN NATRIURETIC PEPTIDE: BNP: 309.1 pg/mL — ABNORMAL HIGH (ref 0.0–100.0)

## 2024-03-11 NOTE — Telephone Encounter (Signed)
-----   Message from Katlyn D West sent at 03/11/2024  4:51 PM EDT ----- Please let [name] know that her kidney function is stable. Her electrolytes are normal. Her BNP has not yet resulted, please have her continue Lasix  40 mg daily.

## 2024-03-11 NOTE — Telephone Encounter (Signed)
 Called patient advised of below they verbalized understanding Patient is active on mychart advised patient that when results come back we will call her and will reach out to her when the office is open.

## 2024-03-12 NOTE — Telephone Encounter (Signed)
-----   Message from Katlyn D West sent at 03/12/2024  2:30 PM EDT ----- Please call and let [name] know that her BNP is elevated, recommend she continue lasix  40 mg daily and start potassium chloride  20 meq daily. She needs a repeat BMET and BNP in one week. Please review ED precautions. Follow up with Dr. Lavonne Prairie on 03/25/24 as planned.

## 2024-03-12 NOTE — Telephone Encounter (Signed)
 Called patient advised of below they verbalized understanding Went over ED percautions

## 2024-03-16 ENCOUNTER — Telehealth: Payer: Self-pay | Admitting: Cardiology

## 2024-03-16 ENCOUNTER — Ambulatory Visit: Payer: Self-pay | Admitting: Internal Medicine

## 2024-03-16 NOTE — Telephone Encounter (Signed)
 Spoke with patient and she would like to know if she can have a new referral for pulmonologist. She would like to be seen in [place] area

## 2024-03-16 NOTE — Telephone Encounter (Signed)
 Pt is requesting a cb to see if K. Chad will send a referral for pt to see a pulmonlogist

## 2024-03-17 ENCOUNTER — Ambulatory Visit (INDEPENDENT_AMBULATORY_CARE_PROVIDER_SITE_OTHER): Admitting: Podiatry

## 2024-03-17 DIAGNOSIS — Q666 Other congenital valgus deformities of feet: Secondary | ICD-10-CM | POA: Diagnosis not present

## 2024-03-17 DIAGNOSIS — M722 Plantar fascial fibromatosis: Secondary | ICD-10-CM

## 2024-03-17 NOTE — Progress Notes (Signed)
 Subjective:  Patient ID: [name], female    DOB: 03-Jul-1961,  MRN: [id number]  Chief Complaint  Patient presents with   Plantar Fasciitis    Injections helped with the pain     63 y.o. female presents with the above complaint.  Patient presents with follow-up of bilateral plantar fasciitis she states she is doing a lot better her pain is completely resolved.  She does not have any pain her pain is 100% better would like to discuss prevention techniques   Review of Systems: Negative except as noted in the HPI. Denies N/V/F/Ch.  Past Medical History:  Diagnosis Date   CHF (congestive heart failure) (HCC)    Hypertension    Overweight(278.02)    Sciatica    Secondary cardiomyopathy, unspecified    EF now 50%    Current Outpatient Medications:    Acetaminophen  (TYLENOL  PO), Take 1,000 mg by mouth daily as needed (for pain)., Disp: , Rfl:    albuterol  (VENTOLIN  HFA) 108 (90 Base) MCG/ACT inhaler, Inhale 1 puff into the lungs every 6 (six) hours as needed., Disp: , Rfl:    apixaban  (ELIQUIS ) 5 MG TABS tablet, TAKE 1 TABLET(5 MG) BY MOUTH TWICE DAILY, Disp: 60 tablet, Rfl: 6   atorvastatin  (LIPITOR ) 80 MG tablet, Take 1 tablet (80 mg total) by mouth daily., Disp: 30 tablet, Rfl: 1   benzonatate  (TESSALON ) 200 MG capsule, Take 200 mg by mouth 3 (three) times daily as needed., Disp: , Rfl:    carvedilol  (COREG ) 25 MG tablet, Take 1 tablet (25 mg total) by mouth 2 (two) times daily with a meal., Disp: 180 tablet, Rfl: 3   empagliflozin  (JARDIANCE ) 10 MG TABS tablet, Take 1 tablet (10 mg total) by mouth daily before breakfast., Disp: 30 tablet, Rfl: 5   furosemide  (LASIX ) 20 MG tablet, Take 2 tablets (40 mg total) by mouth daily. Take in case of weight gain 2 to 3 lbs in 24 hrs or 5 lbs in 7 days until weight back to baseline, Disp: 30 tablet, Rfl: 1   HYDROcodone -acetaminophen  (NORCO/VICODIN) 5-325 MG tablet, Take 1 tablet by mouth daily as needed for severe pain (pain score 7-10).,  Disp: , Rfl:    isosorbide -hydrALAZINE  (BIDIL ) 20-37.5 MG tablet, Take 1 tablet by mouth 3 (three) times daily., Disp: 90 tablet, Rfl: 0   zolpidem  (AMBIEN ) 5 MG tablet, Take 5 mg by mouth at bedtime as needed for sleep., Disp: , Rfl: 1  Social History   Tobacco Use  Smoking Status Never  Smokeless Tobacco Never  Tobacco Comments   Never smoke 03/07/23    Allergies  Allergen Reactions   Latex Itching   Chlorhexidine  Gluconate Hives    Unknown if this is the cause of the hives, raised areas around lab draw sites. No tape and area wiped with chlorhexidine .   Objective:  There were no vitals filed for this visit. There is no height or weight on file to calculate BMI. Constitutional Well developed. Well nourished.  Vascular Dorsalis pedis pulses palpable bilaterally. Posterior tibial pulses palpable bilaterally. Capillary refill normal to all digits.  No cyanosis or clubbing noted. Pedal hair growth normal.  Neurologic Normal speech. Oriented to person, place, and time. Epicritic sensation to light touch grossly present bilaterally.  Dermatologic Nails well groomed and normal in appearance. No open wounds. No skin lesions.  Orthopedic: Normal joint ROM without pain or crepitus bilaterally. No visible deformities. No further tender to palpation at the calcaneal tuber bilaterally. No pain with calcaneal  squeeze bilaterally. Ankle ROM diminished range of motion bilaterally. Silfverskiold Test: positive bilaterally.   Radiographs: None  Assessment:   1. Plantar fasciitis, right   2. Plantar fasciitis, left   3. Pes planovalgus     Plan:  Patient was evaluated and treated and all questions answered.  Plantar Fasciitis, bilaterally - Clinically healed with 1 steroid injection.  At this time I discussed prevention Degele including shoe gear modification orthotics management.  She would like to proceed with getting orthotics denies any other acute complaints  -Pes  planovalgus -I explained to patient the etiology of pes planovalgus and relationship with Planter fasciitis and various treatment options were discussed.  Given patient foot structure in the setting of Planter fasciitis I believe patient will benefit from custom-made orthotics to help control the hindfoot motion support the arch of the foot and take the stress away from plantar fascial.  Patient agrees with the plan like to proceed with orthotics -Patient was casted for orthotics  No follow-ups on file.

## 2024-03-18 DIAGNOSIS — J069 Acute upper respiratory infection, unspecified: Secondary | ICD-10-CM | POA: Diagnosis not present

## 2024-03-18 DIAGNOSIS — J40 Bronchitis, not specified as acute or chronic: Secondary | ICD-10-CM | POA: Diagnosis not present

## 2024-03-18 NOTE — Telephone Encounter (Signed)
 Voicemail not set up.

## 2024-03-22 DIAGNOSIS — N1832 Chronic kidney disease, stage 3b: Secondary | ICD-10-CM | POA: Diagnosis not present

## 2024-03-22 DIAGNOSIS — I639 Cerebral infarction, unspecified: Secondary | ICD-10-CM | POA: Diagnosis not present

## 2024-03-22 DIAGNOSIS — I10 Essential (primary) hypertension: Secondary | ICD-10-CM | POA: Diagnosis not present

## 2024-03-22 DIAGNOSIS — I480 Paroxysmal atrial fibrillation: Secondary | ICD-10-CM | POA: Diagnosis not present

## 2024-03-22 DIAGNOSIS — I5020 Unspecified systolic (congestive) heart failure: Secondary | ICD-10-CM | POA: Diagnosis not present

## 2024-03-23 LAB — HEPATIC FUNCTION PANEL
ALT: 17 IU/L (ref 0–32)
AST: 16 IU/L (ref 0–40)
Albumin: 3.8 g/dL (ref 3.8–4.9)
Alkaline Phosphatase: 115 IU/L (ref 44–121)
Bilirubin Total: 0.5 mg/dL (ref 0.0–1.2)
Bilirubin, Direct: 0.15 mg/dL (ref 0.00–0.40)
Total Protein: 6.8 g/dL (ref 6.0–8.5)

## 2024-03-23 LAB — LIPID PANEL
Chol/HDL Ratio: 3.2 ratio (ref 0.0–4.4)
Cholesterol, Total: 181 mg/dL (ref 100–199)
HDL: 56 mg/dL (ref >39)
LDL Chol Calc (NIH): 109 mg/dL — ABNORMAL HIGH (ref 0–99)
Triglycerides: 90 mg/dL (ref 0–149)
VLDL Cholesterol Cal: 16 mg/dL (ref 5–40)

## 2024-03-24 ENCOUNTER — Telehealth: Payer: Self-pay | Admitting: Cardiology

## 2024-03-24 NOTE — Progress Notes (Signed)
 Cardiology Office Note:   Date:  03/26/2024  ID:  [name], DOB August 05, 1961, MRN [id number] PCP: Barnetta Liberty, MD  [facility] Providers Cardiologist:  Eilleen Grates, MD Electrophysiologist:  Manya Sells, MD {  History of Present Illness:   [name] is a 63 y.o. female  with visit-pertinent history of nonischemic cardiomyopathy, hypertension, stage IIIb CKD, paroxysmal atrial fibrillation on Eliquis , acute left CVA in 2023.  In 2015 she was noted to have EF 25 to 30%, recovered to 50 to 55% in 2018.  In 12/2021 she was admitted for an acute left CVA infarct.  Her follow-up echocardiogram showed an LVEF of 20 to 25%.  Her MRI did not demonstrate a clot source, there is no evidence of previous MI or amyloid.  She was negative for sleep apnea.  She was noted to be COVID-positive.  She was previously trialed on Farxiga but had elevated creatinine following this and this was discontinued.  Her last echocardiogram on 11/14/2022 indicated LVEF of 20 to 25%, LV demonstrating global hypokinesis, LV internal cavity size was severely dilated, G2 DD.  Given persistent reduction in EF she was referred to Dr. Carolynne Citron for consideration of CRT.  She was also referred to nephrology given creatinine 3.0.  On 01/03/2023 she underwent BiV ICD insertion with Dr. Carolynne Citron.  On 01/20/2023 after a remote device check it was alerted that she had episodes of atrial fibrillation.  She was followed up with at the A-fib clinic.  She was started on Eliquis  5 mg twice daily.    She is no longer followed in the Atrial Fib Clinic.  The patient denies any new symptoms such as chest discomfort, neck or arm discomfort. There has been no new shortness of breath, PND or orthopnea. There have been no reported palpitations, presyncope or syncope.   She has her continued chronic cough and she has an upcoming appointment with pulmonary.  She did have some decreased impedance recently and was told by the EP clinic to start taking Lasix   40 mg every day with some over-the-counter potassium.  She was post to get basic metabolic profile but this did not happen.  Her BNP was mildly elevated when it was checked recently.  Liver enzymes were okay.  ROS: As stated in the HPI and negative for all other systems.  Studies Reviewed:    EKG:   NA  Risk Assessment/Calculations:    CHA2DS2-VASc Score = 3   This indicates a 3.2% annual risk of stroke. The patient's score is based upon: CHF History: 1 HTN History: 1 Diabetes History: 0 Stroke History: 0 Vascular Disease History: 0 Age Score: 0 Gender Score: 1       Physical Exam:   VS:  BP 136/88   Pulse 89   Ht 5\' 5"  (1.651 m)   Wt 161 lb (73 kg)   SpO2 96%   BMI 26.79 kg/m    Wt Readings from Last 3 Encounters:  03/25/24 161 lb (73 kg)  02/16/24 171 lb 9.6 oz (77.8 kg)  02/16/24 170 lb 6.4 oz (77.3 kg)     GEN: Well nourished, well developed in no acute distress NECK: No JVD; No carotid bruits CARDIAC: RRR, no murmurs, rubs, gallops RESPIRATORY:  Clear to auscultation without rales, wheezing or rhonchi  ABDOMEN: Soft, non-tender, non-distended EXTREMITIES:  No edema; No deformity   ASSESSMENT AND PLAN:   Chronic HFrEF: The patient is on the optimal medical therapy that she can tolerate with her renal  insufficiency.  She was started on Lasix  daily and she has been taking this as needed.  I will check a basic metabolic profile today.  She will continue with the meds as listed including the Jardiance  and the current dose of carvedilol .   Cough: She is going to start with pulmonary but if there is no clear pulmonary etiology to her symptoms I would suggest she see ENT.   CKD Stage IV: Basic metabolic profile will be checked today.  Follow up with Angie Duke PAc in 2 months.   Signed, Eilleen Grates, MD

## 2024-03-24 NOTE — Telephone Encounter (Signed)
 Advised pt to please keep appt as scheduled tomorrow with Dr Lavonne Prairie.  She states understanding.

## 2024-03-24 NOTE — Addendum Note (Signed)
 Addended by: Dominga Friedman on: 03/24/2024 09:32 AM   Modules accepted: Orders

## 2024-03-24 NOTE — Telephone Encounter (Signed)
 Patient is calling because she has an appt with Dr. Lavonne Prairie for tomorrow at 12:20 PM. Patient was supposed to have lab completed, but since she currently owes [facility] a bill they will not complete her labs. Patient would like to know if she should still come to her appointment without having labs drawn. Patient would like to know where else she should complete her labs. Please advise.

## 2024-03-25 ENCOUNTER — Encounter: Payer: Self-pay | Admitting: Cardiology

## 2024-03-25 ENCOUNTER — Ambulatory Visit: Payer: BC Managed Care – PPO | Attending: Cardiology | Admitting: Cardiology

## 2024-03-25 VITALS — BP 136/88 | HR 89 | Ht 65.0 in | Wt 161.0 lb

## 2024-03-25 DIAGNOSIS — I5022 Chronic systolic (congestive) heart failure: Secondary | ICD-10-CM

## 2024-03-25 NOTE — Patient Instructions (Signed)
 Medication Instructions:  Your physician recommends that you continue on your current medications as directed. Please refer to the Current Medication list given to you today.  *If you need a refill on your cardiac medications before your next appointment, please call your pharmacy*  Lab Work: TODAY (go to 1st floor lab): BMET If you have labs (blood work) drawn today and your tests are completely normal, you will receive your results only by: MyChart Message (if you have MyChart) OR A paper copy in the mail If you have any lab test that is abnormal or we need to change your treatment, we will call you to review the results.  Follow-Up: At [facility], you and your health needs are our priority.  As part of our continuing mission to provide you with exceptional heart care, our providers are all part of one team.  This team includes your primary Cardiologist (physician) and Advanced Practice Providers or APPs (Physician Assistants and Nurse Practitioners) who all work together to provide you with the care you need, when you need it.  Your next appointment:   2 month(s)  Provider:   Marcie Sever, PA-C   We recommend signing up for the patient portal called "MyChart".  Sign up information is provided on this After Visit Summary.  MyChart is used to connect with patients for Virtual Visits (Telemedicine).  Patients are able to view lab/test results, encounter notes, upcoming appointments, etc.  Non-urgent messages can be sent to your provider as well.   To learn more about what you can do with MyChart, go to [email or web address].

## 2024-03-26 ENCOUNTER — Encounter: Payer: Self-pay | Admitting: Cardiology

## 2024-03-26 LAB — BASIC METABOLIC PANEL WITH GFR
BUN/Creatinine Ratio: 17 (ref 12–28)
BUN: 38 mg/dL — ABNORMAL HIGH (ref 8–27)
CO2: 17 mmol/L — ABNORMAL LOW (ref 20–29)
Calcium: 9.2 mg/dL (ref 8.7–10.3)
Chloride: 106 mmol/L (ref 96–106)
Creatinine, Ser: 2.21 mg/dL — ABNORMAL HIGH (ref 0.57–1.00)
Glucose: 76 mg/dL (ref 70–99)
Potassium: 4.7 mmol/L (ref 3.5–5.2)
Sodium: 144 mmol/L (ref 134–144)
eGFR: 25 mL/min/{1.73_m2} — ABNORMAL LOW (ref >59)

## 2024-04-01 ENCOUNTER — Telehealth: Payer: Self-pay

## 2024-04-01 NOTE — Telephone Encounter (Signed)
 LVM to schedule orthotics PU

## 2024-04-02 ENCOUNTER — Telehealth: Payer: Self-pay | Admitting: Cardiology

## 2024-04-02 DIAGNOSIS — R059 Cough, unspecified: Secondary | ICD-10-CM

## 2024-04-02 NOTE — Telephone Encounter (Signed)
 Pt called in asking for a referral to be placed for ENT per her last appt with Dr. Lavonne Prairie. She asked that it be placed with someone in [place].   She also asked for a Pulmonology referral in [place]. Please advise.

## 2024-04-02 NOTE — Telephone Encounter (Signed)
 Spoke with patient and she states would like to know if she still needs to see pulmonology before she see ENT? Id so she will need new referral for [place] area.   Last OV notes states: Cough: She is going to start with pulmonary but if there is no clear pulmonary etiology to her symptoms I would suggest she see ENT.   She states she still has a cough.

## 2024-04-05 DIAGNOSIS — I129 Hypertensive chronic kidney disease with stage 1 through stage 4 chronic kidney disease, or unspecified chronic kidney disease: Secondary | ICD-10-CM | POA: Diagnosis not present

## 2024-04-05 DIAGNOSIS — D631 Anemia in chronic kidney disease: Secondary | ICD-10-CM | POA: Diagnosis not present

## 2024-04-05 DIAGNOSIS — N2581 Secondary hyperparathyroidism of renal origin: Secondary | ICD-10-CM | POA: Diagnosis not present

## 2024-04-05 DIAGNOSIS — N184 Chronic kidney disease, stage 4 (severe): Secondary | ICD-10-CM | POA: Diagnosis not present

## 2024-04-05 LAB — LAB REPORT - SCANNED
Albumin, Urine POC: 88.9
Calcium: 9.4
Creatinine, POC: 56.9 mg/dL
EGFR: 28
Microalb Creat Ratio: 156
PTH: 103

## 2024-04-05 NOTE — Telephone Encounter (Signed)
 Spoke with pt regarding Dr. Atlas Lea suggestion to see pulmonology before ENT. A referral for pulmonology was placed. Pt verbalized understanding. All questions if any were answered.

## 2024-04-06 ENCOUNTER — Ambulatory Visit (INDEPENDENT_AMBULATORY_CARE_PROVIDER_SITE_OTHER): Payer: BC Managed Care – PPO

## 2024-04-06 DIAGNOSIS — I428 Other cardiomyopathies: Secondary | ICD-10-CM | POA: Diagnosis not present

## 2024-04-06 LAB — CUP PACEART REMOTE DEVICE CHECK
Battery Remaining Longevity: 86 mo
Battery Voltage: 2.99 V
Brady Statistic RV Percent Paced: 4.45 %
Date Time Interrogation Session: 20250602190507
HighPow Impedance: 48 Ohm
Implantable Lead Connection Status: 753985
Implantable Lead Connection Status: 753985
Implantable Lead Connection Status: 753985
Implantable Lead Implant Date: 20240301
Implantable Lead Implant Date: 20240301
Implantable Lead Implant Date: 20240301
Implantable Lead Location: 753858
Implantable Lead Location: 753859
Implantable Lead Location: 753860
Implantable Lead Model: 4598
Implantable Lead Model: 5076
Implantable Lead Model: 6935
Implantable Pulse Generator Implant Date: 20240301
Lead Channel Impedance Value: 247 Ohm
Lead Channel Impedance Value: 304 Ohm
Lead Channel Impedance Value: 323 Ohm
Lead Channel Impedance Value: 323 Ohm
Lead Channel Impedance Value: 342 Ohm
Lead Channel Impedance Value: 361 Ohm
Lead Channel Impedance Value: 456 Ohm
Lead Channel Impedance Value: 475 Ohm
Lead Channel Impedance Value: 513 Ohm
Lead Channel Impedance Value: 532 Ohm
Lead Channel Impedance Value: 551 Ohm
Lead Channel Impedance Value: 570 Ohm
Lead Channel Impedance Value: 608 Ohm
Lead Channel Pacing Threshold Amplitude: 0.75 V
Lead Channel Pacing Threshold Amplitude: 1 V
Lead Channel Pacing Threshold Amplitude: 1.25 V
Lead Channel Pacing Threshold Pulse Width: 0.4 ms
Lead Channel Pacing Threshold Pulse Width: 0.4 ms
Lead Channel Pacing Threshold Pulse Width: 0.4 ms
Lead Channel Sensing Intrinsic Amplitude: 15.1 mV
Lead Channel Sensing Intrinsic Amplitude: 3.4 mV
Lead Channel Setting Pacing Amplitude: 1.5 V
Lead Channel Setting Pacing Amplitude: 2 V
Lead Channel Setting Pacing Amplitude: 2.5 V
Lead Channel Setting Pacing Pulse Width: 0.4 ms
Lead Channel Setting Pacing Pulse Width: 0.4 ms
Lead Channel Setting Sensing Sensitivity: 0.3 mV
Zone Setting Status: 755011
Zone Setting Status: 755011

## 2024-04-08 ENCOUNTER — Ambulatory Visit: Payer: Self-pay | Admitting: Internal Medicine

## 2024-04-14 ENCOUNTER — Ambulatory Visit: Payer: Self-pay | Admitting: Cardiology

## 2024-04-25 ENCOUNTER — Emergency Department (HOSPITAL_COMMUNITY)

## 2024-04-25 ENCOUNTER — Emergency Department (HOSPITAL_COMMUNITY)
Admission: EM | Admit: 2024-04-25 | Discharge: 2024-04-26 | Disposition: A | Discharge status: Home / Self Care | Attending: Emergency Medicine | Admitting: Emergency Medicine

## 2024-04-25 ENCOUNTER — Other Ambulatory Visit: Payer: Self-pay

## 2024-04-25 ENCOUNTER — Encounter (HOSPITAL_COMMUNITY): Payer: Self-pay

## 2024-04-25 DIAGNOSIS — Z91040 Latex allergy status: Secondary | ICD-10-CM | POA: Insufficient documentation

## 2024-04-25 DIAGNOSIS — R0602 Shortness of breath: Secondary | ICD-10-CM | POA: Diagnosis not present

## 2024-04-25 DIAGNOSIS — I110 Hypertensive heart disease with heart failure: Secondary | ICD-10-CM | POA: Insufficient documentation

## 2024-04-25 DIAGNOSIS — R059 Cough, unspecified: Secondary | ICD-10-CM | POA: Diagnosis not present

## 2024-04-25 DIAGNOSIS — I517 Cardiomegaly: Secondary | ICD-10-CM | POA: Diagnosis not present

## 2024-04-25 DIAGNOSIS — I509 Heart failure, unspecified: Secondary | ICD-10-CM | POA: Diagnosis not present

## 2024-04-25 DIAGNOSIS — Z7901 Long term (current) use of anticoagulants: Secondary | ICD-10-CM | POA: Insufficient documentation

## 2024-04-25 DIAGNOSIS — E876 Hypokalemia: Secondary | ICD-10-CM | POA: Diagnosis not present

## 2024-04-25 DIAGNOSIS — Z79899 Other long term (current) drug therapy: Secondary | ICD-10-CM | POA: Diagnosis not present

## 2024-04-25 DIAGNOSIS — I10 Essential (primary) hypertension: Secondary | ICD-10-CM | POA: Diagnosis not present

## 2024-04-25 DIAGNOSIS — R0689 Other abnormalities of breathing: Secondary | ICD-10-CM | POA: Diagnosis not present

## 2024-04-25 DIAGNOSIS — R0989 Other specified symptoms and signs involving the circulatory and respiratory systems: Secondary | ICD-10-CM | POA: Diagnosis not present

## 2024-04-25 DIAGNOSIS — R001 Bradycardia, unspecified: Secondary | ICD-10-CM | POA: Diagnosis not present

## 2024-04-25 LAB — CBC WITH DIFFERENTIAL/PLATELET
Abs Immature Granulocytes: 0.03 10*3/uL (ref 0.00–0.07)
Basophils Absolute: 0.1 10*3/uL (ref 0.0–0.1)
Basophils Relative: 1 %
Eosinophils Absolute: 0.7 10*3/uL — ABNORMAL HIGH (ref 0.0–0.5)
Eosinophils Relative: 8 %
HCT: 31 % — ABNORMAL LOW (ref 36.0–46.0)
Hemoglobin: 10 g/dL — ABNORMAL LOW (ref 12.0–15.0)
Immature Granulocytes: 0 %
Lymphocytes Relative: 10 %
Lymphs Abs: 0.9 10*3/uL (ref 0.7–4.0)
MCH: 29.5 pg (ref 26.0–34.0)
MCHC: 32.3 g/dL (ref 30.0–36.0)
MCV: 91.4 fL (ref 80.0–100.0)
Monocytes Absolute: 1 10*3/uL (ref 0.1–1.0)
Monocytes Relative: 11 %
Neutro Abs: 6.2 10*3/uL (ref 1.7–7.7)
Neutrophils Relative %: 70 %
Platelets: 287 10*3/uL (ref 150–400)
RBC: 3.39 MIL/uL — ABNORMAL LOW (ref 3.87–5.11)
RDW: 14.3 % (ref 11.5–15.5)
WBC: 8.8 10*3/uL (ref 4.0–10.5)
nRBC: 0 % (ref 0.0–0.2)

## 2024-04-25 LAB — MAGNESIUM: Magnesium: 1.7 mg/dL (ref 1.7–2.4)

## 2024-04-25 LAB — COMPREHENSIVE METABOLIC PANEL WITH GFR
ALT: 11 U/L (ref 0–44)
AST: 15 U/L (ref 15–41)
Albumin: 2.8 g/dL — ABNORMAL LOW (ref 3.5–5.0)
Alkaline Phosphatase: 68 U/L (ref 38–126)
Anion gap: 15 (ref 5–15)
BUN: 40 mg/dL — ABNORMAL HIGH (ref 6–20)
CO2: 20 mmol/L — ABNORMAL LOW (ref 22–32)
Calcium: 8.6 mg/dL — ABNORMAL LOW (ref 8.9–10.3)
Chloride: 102 mmol/L (ref 98–111)
Creatinine, Ser: 2.56 mg/dL — ABNORMAL HIGH (ref 0.44–1.00)
GFR, Estimated: 21 mL/min — ABNORMAL LOW (ref >60)
Glucose, Bld: 118 mg/dL — ABNORMAL HIGH (ref 70–99)
Potassium: 3.2 mmol/L — ABNORMAL LOW (ref 3.5–5.1)
Sodium: 137 mmol/L (ref 135–145)
Total Bilirubin: 0.7 mg/dL (ref 0.0–1.2)
Total Protein: 6.8 g/dL (ref 6.5–8.1)

## 2024-04-25 LAB — RESP PANEL BY RT-PCR (RSV, FLU A&B, COVID)  RVPGX2
Influenza A by PCR: NEGATIVE
Influenza B by PCR: NEGATIVE
Resp Syncytial Virus by PCR: NEGATIVE
SARS Coronavirus 2 by RT PCR: NEGATIVE

## 2024-04-25 LAB — TROPONIN I (HIGH SENSITIVITY): Troponin I (High Sensitivity): 27 ng/L — ABNORMAL HIGH (ref <18)

## 2024-04-25 LAB — BRAIN NATRIURETIC PEPTIDE: B Natriuretic Peptide: 430.6 pg/mL — ABNORMAL HIGH (ref 0.0–100.0)

## 2024-04-25 MED ORDER — POTASSIUM CHLORIDE CRYS ER 20 MEQ PO TBCR
40.0000 meq | EXTENDED_RELEASE_TABLET | Freq: Once | ORAL | Status: AC
  Administered 2024-04-26: 40 meq via ORAL
  Filled 2024-04-25: qty 2

## 2024-04-25 MED ORDER — FUROSEMIDE 10 MG/ML IJ SOLN
20.0000 mg | Freq: Once | INTRAMUSCULAR | Status: AC
  Administered 2024-04-26: 20 mg via INTRAVENOUS
  Filled 2024-04-25: qty 2

## 2024-04-25 MED ORDER — IPRATROPIUM-ALBUTEROL 0.5-2.5 (3) MG/3ML IN SOLN
3.0000 mL | Freq: Once | RESPIRATORY_TRACT | Status: AC
  Administered 2024-04-25: 3 mL via RESPIRATORY_TRACT
  Filled 2024-04-25: qty 3

## 2024-04-25 NOTE — Discharge Instructions (Addendum)
 As we discussed, your workup in the ER today was overall reassuring for acute findings.  Does look like that you were retaining some fluid which likely made you feel short of breath.  We did give you a dose of your home Lasix  through the IV to help with this.  Given that you feel well after this and you are able to walk without dropping your oxygen, no further evaluation is indicated.  You need to follow-up with your PCP in the next few days for follow-up evaluation.  Return if development of any new or worsening symptoms.

## 2024-04-25 NOTE — ED Provider Notes (Signed)
 [place] EMERGENCY DEPARTMENT AT [facility] Provider Note   CSN: [id number] Arrival date & time: 04/25/24  2009     Patient presents with: Shortness of Breath   MINA BABULA is a 63 y.o. female.  {Add pertinent medical, surgical, social history, OB history to YEP:67052} Patient with history of CHF, hypertension presents today with complaints of shortness of breath.  She states that she has had cough and congestion since October 2024.  She has tried multiple modalities to help with this.  Has an appointment in a few weeks with ENT to further evaluate this.  She is also seeing pulmonology in July.  States in the last couple of days she has felt more short of breath.  She does not feel fluid overloaded but is not sure.  She has been compliant with her Lasix  regimen.  Denies any chest pain.  Does note that she got substantial improvement with the nebulizer treatments that EMS provided to her prior to arrival.  Denies any nausea or vomiting.  No fevers or chills.  No worsening of her cough or congestion.  The history is provided by the patient. No language interpreter was used.  Shortness of Breath      Prior to Admission medications   Medication Sig Start Date End Date Taking? Authorizing Provider  Acetaminophen  (TYLENOL  PO) Take 1,000 mg by mouth daily as needed (for pain).    [provider]  albuterol  (VENTOLIN  HFA) 108 (90 Base) MCG/ACT inhaler Inhale 1 puff into the lungs every 6 (six) hours as needed. 10/06/23   [provider]  apixaban  (ELIQUIS ) 5 MG TABS tablet TAKE 1 TABLET(5 MG) BY MOUTH TWICE DAILY 03/08/24   Lavona Agent, MD  atorvastatin  (LIPITOR ) 80 MG tablet Take 1 tablet (80 mg total) by mouth daily. 12/19/21   de Clint Kill, Cortney E, NP  benzonatate  (TESSALON ) 200 MG capsule Take 200 mg by mouth 3 (three) times daily as needed. 01/29/24   [provider]  carvedilol  (COREG ) 25 MG tablet Take 1 tablet (25 mg total) by mouth 2 (two) times  daily with a meal. 02/26/23   Lavona Agent, MD  empagliflozin  (JARDIANCE ) 10 MG TABS tablet Take 1 tablet (10 mg total) by mouth daily before breakfast. 01/26/24   Milford, Harlene HERO, FNP  furosemide  (LASIX ) 20 MG tablet Take 2 tablets (40 mg total) by mouth daily. Take in case of weight gain 2 to 3 lbs in 24 hrs or 5 lbs in 7 days until weight back to baseline 02/06/24   Arrien, Elidia Sieving, MD  HYDROcodone -acetaminophen  (NORCO/VICODIN) 5-325 MG tablet Take 1 tablet by mouth daily as needed for severe pain (pain score 7-10).    [provider]  isosorbide -hydrALAZINE  (BIDIL ) 20-37.5 MG tablet Take 1 tablet by mouth 3 (three) times daily. 02/06/24   Arrien, Elidia Sieving, MD  zolpidem  (AMBIEN ) 5 MG tablet Take 5 mg by mouth at bedtime as needed for sleep. 01/17/18   [provider]    Allergies: Latex and Chlorhexidine  gluconate    Review of Systems  Respiratory:  Positive for shortness of breath.   All other systems reviewed and are negative.   Updated Vital Signs BP 120/85   Pulse 87   Temp 98.4 F (36.9 C) (Axillary)   Resp (!) 23   Ht 5' 5 (1.651 m)   Wt 73 kg   SpO2 100%   BMI 26.78 kg/m   Physical Exam Vitals and nursing note reviewed.  Constitutional:  General: She is not in acute distress.    Appearance: Normal appearance. She is normal weight. She is not ill-appearing, toxic-appearing or diaphoretic.  HENT:     Head: Normocephalic and atraumatic.   Cardiovascular:     Rate and Rhythm: Normal rate and regular rhythm.     Heart sounds: Normal heart sounds.  Pulmonary:     Effort: Pulmonary effort is normal. No respiratory distress.     Breath sounds: Normal breath sounds.  Abdominal:     Palpations: Abdomen is soft.     Tenderness: There is no abdominal tenderness.   Musculoskeletal:        General: Normal range of motion.     Cervical back: Normal range of motion.     Right lower leg: No tenderness. No edema.     Left lower leg: No  tenderness. No edema.   Skin:    General: Skin is warm and dry.   Neurological:     General: No focal deficit present.     Mental Status: She is alert.   Psychiatric:        Mood and Affect: Mood normal.        Behavior: Behavior normal.     (all labs ordered are listed, but only abnormal results are displayed) Labs Reviewed  COMPREHENSIVE METABOLIC PANEL WITH GFR - Abnormal; Notable for the following components:      Result Value   Potassium 3.2 (*)    CO2 20 (*)    Glucose, Bld 118 (*)    BUN 40 (*)    Creatinine, Ser 2.56 (*)    Calcium  8.6 (*)    Albumin 2.8 (*)    GFR, Estimated 21 (*)    All other components within normal limits  CBC WITH DIFFERENTIAL/PLATELET - Abnormal; Notable for the following components:   RBC 3.39 (*)    Hemoglobin 10.0 (*)    HCT 31.0 (*)    Eosinophils Absolute 0.7 (*)    All other components within normal limits  TROPONIN I (HIGH SENSITIVITY) - Abnormal; Notable for the following components:   Troponin I (High Sensitivity) 27 (*)    All other components within normal limits  RESP PANEL BY RT-PCR (RSV, FLU A&B, COVID)  RVPGX2  MAGNESIUM   BRAIN NATRIURETIC PEPTIDE  TROPONIN I (HIGH SENSITIVITY)    EKG: None  Radiology: DG Chest 2 View Result Date: 04/25/2024 CLINICAL DATA:  Shortness of breath EXAM: CHEST - 2 VIEW COMPARISON:  Chest x-ray 01/31/2024 FINDINGS: Left-sided ICD is present. The heart is enlarged. There central pulmonary vascular congestion. The lungs are otherwise clear. There is no pleural effusion or pneumothorax. No acute fractures are seen. IMPRESSION: Cardiomegaly with central pulmonary vascular congestion. Electronically Signed   By: Greig Pique M.D.   On: 04/25/2024 21:44    {Document cardiac monitor, telemetry assessment procedure when appropriate:32947} Procedures   Medications Ordered in the ED  ipratropium-albuterol  (DUONEB) 0.5-2.5 (3) MG/3ML nebulizer solution 3 mL (has no administration in time range)   furosemide  (LASIX ) injection 20 mg (has no administration in time range)    Clinical Course as of 04/25/24 2333  Sun Apr 25, 2024  2323 CHF, low EF. Does not appear clinically fluid overloaded. Cough since October. More SOB than normal. Initially hypoxic however after neb no longer hypoxic. Symptomatic improvement. Neb ordered, no wheezes. Congestion on cxray. 20mg  lasix  after BNP. Can most likely go home after lasix  [CG]    Clinical Course User Index [CG] Ruthell Lonni FALCON,  PA-C   {Click here for ABCD2, HEART and other calculators REFRESH Note before signing:1}                              Medical Decision Making Amount and/or Complexity of Data Reviewed Labs: ordered. Radiology: ordered.  Risk Prescription drug management.   This patient is a 63 y.o. female who presents to the ED for concern of shortness of breath, this involves an extensive number of treatment options, and is a complaint that carries with it a high risk of complications and morbidity. The emergent differential diagnosis prior to evaluation includes, but is not limited to,  CHF, pericardial effusion/tamponade, arrhythmias, ACS, COPD, asthma, bronchitis, pneumonia, pneumothorax, PE, anemia   This is not an exhaustive differential.   Past Medical History / Co-morbidities / Social History:  has a past medical history of CHF (congestive heart failure) (HCC), Hypertension, Overweight(278.02), Sciatica, and Secondary cardiomyopathy, unspecified.  Additional history: Chart reviewed. Pertinent results include: Patient with 2 admissions previous this year for CHF exacerbation.  Most recent echocardiogram showed EF of 20 to 25%.  Patient with a defibrillator in place.  Physical Exam: Physical exam performed. The pertinent findings include: Overall well-appearing, speaking in complete sentences without any distress.  Lung sounds are clear to auscultation in all fields  Lab Tests: I ordered, and personally interpreted  labs.  The pertinent results include: No leukocytosis, hemoglobin 10 consistent with previous.  K 3.2.  Creatinine 2.56 consistent with previous.  BNP 430.  Troponin 27 consistent with previous, likely due to CKD.   Imaging Studies: I ordered imaging studies including CXR. I independently visualized and interpreted imaging which showed   Cardiomegaly with central pulmonary vascular congestion.   I agree with the radiologist interpretation.   Cardiac Monitoring:  The patient was maintained on a cardiac monitor.  My attending physician Dr. PIERRETTE viewed and interpreted the cardiac monitored which showed an underlying rhythm of: ***. I agree with this interpretation.   Medications: I ordered medication including DuoNeb, Lasix  for shortness of breath. Reevaluation of the patient after these medicines showed that the patient {resolved/improved/worsened:23923::improved}. I have reviewed the patients home medicines and have made adjustments as needed.  Disposition: After consideration of the diagnostic results and the patients response to treatment, I feel that *** .   ***emergency department workup does not suggest an emergent condition requiring admission or immediate intervention beyond what has been performed at this time. The plan is: ***. The patient is safe for discharge and has been instructed to return immediately for worsening symptoms, change in symptoms or any other concerns.  I discussed this case with my attending physician Dr. PIERRETTE who cosigned this note including patient's presenting symptoms, physical exam, and planned diagnostics and interventions. Attending physician stated agreement with plan or made changes to plan which were implemented.     {Document critical care time when appropriate  Document review of labs and clinical decision tools ie CHADS2VASC2, etc  Document your independent review of radiology images and any outside records  Document your discussion with family  members, caretakers and with consultants  Document social determinants of health affecting pt's care  Document your decision making why or why not admission, treatments were needed:32947:::1}   Final diagnoses:  None    ED Discharge Orders     None

## 2024-04-25 NOTE — ED Triage Notes (Signed)
 Pt BIB [facility] from home d/t SOB and productive cough.  EMS gave 1 duoneb  BP 180/100 RA 89% on duoneb now from EMS now 100% HR 90  L Hand 22g

## 2024-04-26 ENCOUNTER — Telehealth: Payer: Self-pay | Admitting: Cardiology

## 2024-04-26 LAB — TROPONIN I (HIGH SENSITIVITY): Troponin I (High Sensitivity): 26 ng/L — ABNORMAL HIGH (ref <18)

## 2024-04-26 NOTE — Telephone Encounter (Signed)
 Patient called in to inform Dr. Lavona that she went to the ER last night incase he wanted to review the notes and provide any recommendations.

## 2024-04-26 NOTE — Telephone Encounter (Signed)
 Will forward this information to Dr. Lavona and covering RN, as an FYI from the pt.

## 2024-04-26 NOTE — Addendum Note (Signed)
 Addended by: TAWNI DRILLING D on: 04/26/2024 12:32 PM   Modules accepted: Orders, Level of Service

## 2024-04-26 NOTE — Progress Notes (Signed)
 Remote ICD transmission.

## 2024-04-26 NOTE — ED Notes (Signed)
 Pt ambulated with no visible drop beneath normal limits. Pt stayed within 93% and 98% SpO2 while ambulating.

## 2024-04-26 NOTE — ED Provider Notes (Signed)
  Physical Exam  BP (!) 142/95   Pulse 81   Temp 98.5 F (36.9 C) (Oral)   Resp (!) 21   Ht 5' 5 (1.651 m)   Wt 73 kg   SpO2 100%   BMI 26.78 kg/m   Physical Exam Vitals and nursing note reviewed.  Constitutional:      General: She is not in acute distress.    Appearance: She is not toxic-appearing.  HENT:     Head: Normocephalic and atraumatic.  Pulmonary:     Effort: No respiratory distress.   Skin:    Coloration: Skin is not jaundiced or pale.   Neurological:     Mental Status: She is alert and oriented to person, place, and time.   Psychiatric:        Behavior: Behavior normal.     Procedures  Procedures  ED Course / MDM   Clinical Course as of 04/26/24 0129  Sun Apr 25, 2024  2323 CHF, low EF. Does not appear clinically fluid overloaded. Cough since October. More SOB than normal. Initially hypoxic however after neb no longer hypoxic. Symptomatic improvement. Neb ordered, no wheezes. Congestion on cxray. 20mg  lasix  after BNP. Can most likely go home after lasix  [CG]    Clinical Course User Index [CG] Ruthell Lonni FALCON, PA-C   Medical Decision Making Amount and/or Complexity of Data Reviewed Labs: ordered. Radiology: ordered.  Risk Prescription drug management.   63 year old female signed out to me at shift change.  Please see previous provider note for further details.  In short, 63 year old female with CHF comes in with concern of shortness of breath and cough.  Patient initially hypoxic however after initial DuoNeb with EMS, hypoxia resolved.  Patient was signed out to me pending IV Lasix , potassium repletion, lab result for BNP.  BNP resulted.  430.6.  No signs of effusions on chest x-ray.  Patient was offered admission for diuresis but states that she wishes to go home.  Initially was hypoxic however no longer hypoxic.  Patient was ambulated and maintained oxygen saturation. Have advised patient to continue taking Lasix  as prescribed.  Will  provide patient with 20 mg IV Lasix  here as well as 40 mEq oral potassium.  Patient discharged in stable condition.  Advised to follow-up with PCP.       Ruthell Lonni FALCON, PA-C 04/26/24 0129    Griselda Norris, MD 04/26/24 [phone]

## 2024-04-27 DIAGNOSIS — D649 Anemia, unspecified: Secondary | ICD-10-CM | POA: Diagnosis not present

## 2024-04-28 DIAGNOSIS — I480 Paroxysmal atrial fibrillation: Secondary | ICD-10-CM | POA: Diagnosis not present

## 2024-04-28 DIAGNOSIS — E785 Hyperlipidemia, unspecified: Secondary | ICD-10-CM | POA: Diagnosis not present

## 2024-04-30 NOTE — Telephone Encounter (Signed)
 Spoke with pt regarding Dr. Denver suggestion to keep appointment. Pt aware. Pt is asking how much Vitamin D to take daily. Pt stated Dr. Lavona said her vitamin D was low. Pt has been taking 25 mcg each day. Pt was told her question would be forwarded to Dr. Lavona. Pt verbalized understanding. All questions if any were answered.

## 2024-05-11 ENCOUNTER — Other Ambulatory Visit: Payer: Self-pay | Admitting: Cardiology

## 2024-05-12 ENCOUNTER — Other Ambulatory Visit

## 2024-05-17 ENCOUNTER — Inpatient Hospital Stay (HOSPITAL_COMMUNITY)
Admission: EM | Admit: 2024-05-17 | Discharge: 2024-05-18 | DRG: 291 | Disposition: A | Discharge status: Home / Self Care | Attending: Internal Medicine | Admitting: Internal Medicine

## 2024-05-17 ENCOUNTER — Emergency Department (HOSPITAL_COMMUNITY)

## 2024-05-17 ENCOUNTER — Encounter (HOSPITAL_COMMUNITY): Payer: Self-pay | Admitting: Emergency Medicine

## 2024-05-17 DIAGNOSIS — Z79899 Other long term (current) drug therapy: Secondary | ICD-10-CM

## 2024-05-17 DIAGNOSIS — I5023 Acute on chronic systolic (congestive) heart failure: Principal | ICD-10-CM | POA: Diagnosis present

## 2024-05-17 DIAGNOSIS — Z90710 Acquired absence of both cervix and uterus: Secondary | ICD-10-CM | POA: Diagnosis not present

## 2024-05-17 DIAGNOSIS — Z95810 Presence of automatic (implantable) cardiac defibrillator: Secondary | ICD-10-CM | POA: Diagnosis not present

## 2024-05-17 DIAGNOSIS — Z91040 Latex allergy status: Secondary | ICD-10-CM

## 2024-05-17 DIAGNOSIS — E663 Overweight: Secondary | ICD-10-CM | POA: Diagnosis present

## 2024-05-17 DIAGNOSIS — I517 Cardiomegaly: Secondary | ICD-10-CM | POA: Diagnosis not present

## 2024-05-17 DIAGNOSIS — Z888 Allergy status to other drugs, medicaments and biological substances status: Secondary | ICD-10-CM

## 2024-05-17 DIAGNOSIS — I480 Paroxysmal atrial fibrillation: Secondary | ICD-10-CM | POA: Diagnosis present

## 2024-05-17 DIAGNOSIS — Z8249 Family history of ischemic heart disease and other diseases of the circulatory system: Secondary | ICD-10-CM | POA: Diagnosis not present

## 2024-05-17 DIAGNOSIS — I429 Cardiomyopathy, unspecified: Secondary | ICD-10-CM | POA: Diagnosis not present

## 2024-05-17 DIAGNOSIS — R053 Chronic cough: Secondary | ICD-10-CM | POA: Diagnosis present

## 2024-05-17 DIAGNOSIS — Z7984 Long term (current) use of oral hypoglycemic drugs: Secondary | ICD-10-CM

## 2024-05-17 DIAGNOSIS — R7989 Other specified abnormal findings of blood chemistry: Secondary | ICD-10-CM

## 2024-05-17 DIAGNOSIS — R069 Unspecified abnormalities of breathing: Secondary | ICD-10-CM | POA: Diagnosis not present

## 2024-05-17 DIAGNOSIS — J45909 Unspecified asthma, uncomplicated: Secondary | ICD-10-CM | POA: Diagnosis not present

## 2024-05-17 DIAGNOSIS — N184 Chronic kidney disease, stage 4 (severe): Secondary | ICD-10-CM | POA: Diagnosis present

## 2024-05-17 DIAGNOSIS — I130 Hypertensive heart and chronic kidney disease with heart failure and stage 1 through stage 4 chronic kidney disease, or unspecified chronic kidney disease: Principal | ICD-10-CM | POA: Diagnosis present

## 2024-05-17 DIAGNOSIS — M5430 Sciatica, unspecified side: Secondary | ICD-10-CM | POA: Diagnosis present

## 2024-05-17 DIAGNOSIS — Z7901 Long term (current) use of anticoagulants: Secondary | ICD-10-CM | POA: Diagnosis not present

## 2024-05-17 DIAGNOSIS — J449 Chronic obstructive pulmonary disease, unspecified: Secondary | ICD-10-CM | POA: Diagnosis not present

## 2024-05-17 DIAGNOSIS — R0602 Shortness of breath: Secondary | ICD-10-CM | POA: Diagnosis not present

## 2024-05-17 DIAGNOSIS — N289 Disorder of kidney and ureter, unspecified: Secondary | ICD-10-CM

## 2024-05-17 DIAGNOSIS — R059 Cough, unspecified: Secondary | ICD-10-CM | POA: Diagnosis not present

## 2024-05-17 DIAGNOSIS — D649 Anemia, unspecified: Secondary | ICD-10-CM | POA: Diagnosis present

## 2024-05-17 DIAGNOSIS — I5020 Unspecified systolic (congestive) heart failure: Secondary | ICD-10-CM | POA: Diagnosis present

## 2024-05-17 DIAGNOSIS — R062 Wheezing: Secondary | ICD-10-CM | POA: Diagnosis not present

## 2024-05-17 DIAGNOSIS — D631 Anemia in chronic kidney disease: Secondary | ICD-10-CM | POA: Diagnosis present

## 2024-05-17 DIAGNOSIS — I509 Heart failure, unspecified: Secondary | ICD-10-CM | POA: Diagnosis not present

## 2024-05-17 DIAGNOSIS — I10 Essential (primary) hypertension: Secondary | ICD-10-CM | POA: Diagnosis not present

## 2024-05-17 DIAGNOSIS — Z8673 Personal history of transient ischemic attack (TIA), and cerebral infarction without residual deficits: Secondary | ICD-10-CM

## 2024-05-17 DIAGNOSIS — N189 Chronic kidney disease, unspecified: Secondary | ICD-10-CM | POA: Diagnosis not present

## 2024-05-17 LAB — CBC WITH DIFFERENTIAL/PLATELET
Abs Immature Granulocytes: 0.03 K/uL (ref 0.00–0.07)
Basophils Absolute: 0.1 K/uL (ref 0.0–0.1)
Basophils Relative: 1 %
Eosinophils Absolute: 0.8 K/uL — ABNORMAL HIGH (ref 0.0–0.5)
Eosinophils Relative: 12 %
HCT: 33.6 % — ABNORMAL LOW (ref 36.0–46.0)
Hemoglobin: 10.6 g/dL — ABNORMAL LOW (ref 12.0–15.0)
Immature Granulocytes: 0 %
Lymphocytes Relative: 19 %
Lymphs Abs: 1.3 K/uL (ref 0.7–4.0)
MCH: 29.7 pg (ref 26.0–34.0)
MCHC: 31.5 g/dL (ref 30.0–36.0)
MCV: 94.1 fL (ref 80.0–100.0)
Monocytes Absolute: 0.8 K/uL (ref 0.1–1.0)
Monocytes Relative: 12 %
Neutro Abs: 3.9 K/uL (ref 1.7–7.7)
Neutrophils Relative %: 56 %
Platelets: 200 K/uL (ref 150–400)
RBC: 3.57 MIL/uL — ABNORMAL LOW (ref 3.87–5.11)
RDW: 14.9 % (ref 11.5–15.5)
WBC: 6.9 K/uL (ref 4.0–10.5)
nRBC: 0 % (ref 0.0–0.2)

## 2024-05-17 LAB — RESPIRATORY PANEL BY PCR

## 2024-05-17 LAB — BASIC METABOLIC PANEL WITH GFR
Anion gap: 13 (ref 5–15)
BUN: 33 mg/dL — ABNORMAL HIGH (ref 6–20)
CO2: 20 mmol/L — ABNORMAL LOW (ref 22–32)
Calcium: 8.5 mg/dL — ABNORMAL LOW (ref 8.9–10.3)
Chloride: 106 mmol/L (ref 98–111)
Creatinine, Ser: 2.21 mg/dL — ABNORMAL HIGH (ref 0.44–1.00)
GFR, Estimated: 25 mL/min — ABNORMAL LOW (ref >60)
Glucose, Bld: 126 mg/dL — ABNORMAL HIGH (ref 70–99)
Potassium: 3.6 mmol/L (ref 3.5–5.1)
Sodium: 139 mmol/L (ref 135–145)

## 2024-05-17 LAB — BRAIN NATRIURETIC PEPTIDE: B Natriuretic Peptide: 496 pg/mL — ABNORMAL HIGH (ref 0.0–100.0)

## 2024-05-17 LAB — TROPONIN I (HIGH SENSITIVITY)
Troponin I (High Sensitivity): 32 ng/L — ABNORMAL HIGH (ref <18)
Troponin I (High Sensitivity): 38 ng/L — ABNORMAL HIGH (ref <18)

## 2024-05-17 MED ORDER — SODIUM CHLORIDE 0.9% FLUSH
3.0000 mL | Freq: Two times a day (BID) | INTRAVENOUS | Status: DC
  Administered 2024-05-17 (×2): 3 mL via INTRAVENOUS

## 2024-05-17 MED ORDER — HYDROCODONE-ACETAMINOPHEN 5-325 MG PO TABS: 1.0000 | ORAL_TABLET | Freq: Every day | ORAL | Status: DC | PRN

## 2024-05-17 MED ORDER — LORATADINE 10 MG PO TABS
10.0000 mg | ORAL_TABLET | Freq: Every day | ORAL | Status: DC
  Administered 2024-05-17: 10 mg via ORAL
  Filled 2024-05-17: qty 1

## 2024-05-17 MED ORDER — ACETAMINOPHEN 325 MG PO TABS
650.0000 mg | ORAL_TABLET | Freq: Four times a day (QID) | ORAL | Status: DC | PRN
  Administered 2024-05-18: 650 mg via ORAL
  Filled 2024-05-17: qty 2

## 2024-05-17 MED ORDER — CARVEDILOL 12.5 MG PO TABS
25.0000 mg | ORAL_TABLET | Freq: Two times a day (BID) | ORAL | Status: DC
  Administered 2024-05-18: 25 mg via ORAL
  Filled 2024-05-17: qty 2

## 2024-05-17 MED ORDER — FUROSEMIDE 10 MG/ML IJ SOLN
20.0000 mg | Freq: Two times a day (BID) | INTRAMUSCULAR | Status: DC
  Administered 2024-05-17 – 2024-05-18 (×2): 20 mg via INTRAVENOUS
  Filled 2024-05-17 (×2): qty 2

## 2024-05-17 MED ORDER — ALBUTEROL SULFATE (2.5 MG/3ML) 0.083% IN NEBU
2.5000 mg | INHALATION_SOLUTION | RESPIRATORY_TRACT | Status: DC | PRN
  Administered 2024-05-17: 2.5 mg via RESPIRATORY_TRACT
  Filled 2024-05-17: qty 3

## 2024-05-17 MED ORDER — FLUTICASONE FUROATE-VILANTEROL 200-25 MCG/ACT IN AEPB
1.0000 | INHALATION_SPRAY | Freq: Every day | RESPIRATORY_TRACT | Status: DC
  Filled 2024-05-17: qty 28

## 2024-05-17 MED ORDER — ZOLPIDEM TARTRATE 5 MG PO TABS
5.0000 mg | ORAL_TABLET | Freq: Every evening | ORAL | Status: DC | PRN
  Administered 2024-05-17: 5 mg via ORAL
  Filled 2024-05-17: qty 1

## 2024-05-17 MED ORDER — ATORVASTATIN CALCIUM 80 MG PO TABS
80.0000 mg | ORAL_TABLET | Freq: Every day | ORAL | Status: DC
  Administered 2024-05-17: 80 mg via ORAL
  Filled 2024-05-17: qty 1

## 2024-05-17 MED ORDER — FUROSEMIDE 10 MG/ML IJ SOLN
20.0000 mg | Freq: Once | INTRAMUSCULAR | Status: AC
  Administered 2024-05-17: 20 mg via INTRAVENOUS
  Filled 2024-05-17: qty 2

## 2024-05-17 MED ORDER — PREDNISONE 20 MG PO TABS
40.0000 mg | ORAL_TABLET | Freq: Every day | ORAL | Status: DC
  Administered 2024-05-17 – 2024-05-18 (×2): 40 mg via ORAL
  Filled 2024-05-17 (×2): qty 2

## 2024-05-17 MED ORDER — EMPAGLIFLOZIN 10 MG PO TABS
10.0000 mg | ORAL_TABLET | Freq: Every day | ORAL | Status: DC
  Administered 2024-05-18: 10 mg via ORAL
  Filled 2024-05-17: qty 1

## 2024-05-17 MED ORDER — IPRATROPIUM-ALBUTEROL 0.5-2.5 (3) MG/3ML IN SOLN
3.0000 mL | Freq: Once | RESPIRATORY_TRACT | Status: AC
  Administered 2024-05-17: 3 mL via RESPIRATORY_TRACT
  Filled 2024-05-17: qty 3

## 2024-05-17 MED ORDER — APIXABAN 5 MG PO TABS
5.0000 mg | ORAL_TABLET | Freq: Two times a day (BID) | ORAL | Status: DC
  Administered 2024-05-17: 5 mg via ORAL
  Filled 2024-05-17: qty 1

## 2024-05-17 MED ORDER — ACETAMINOPHEN 650 MG RE SUPP: 650.0000 mg | Freq: Four times a day (QID) | RECTAL | Status: DC | PRN

## 2024-05-17 MED ORDER — ISOSORB DINITRATE-HYDRALAZINE 20-37.5 MG PO TABS
1.0000 | ORAL_TABLET | Freq: Three times a day (TID) | ORAL | Status: DC
  Administered 2024-05-17: 1 via ORAL
  Filled 2024-05-17: qty 1

## 2024-05-17 NOTE — H&P (Signed)
 History and Physical    Patient: [name] FMW:[id number] DOB: 1961/02/25 DOA: 05/17/2024 DOS: the patient was seen and examined on 05/17/2024 PCP: Larnell Hamilton, MD  Patient coming from: Home  Chief Complaint:  Chief Complaint  Patient presents with   Shortness of Breath   HPI: [name] is a 63 y.o. female with medical history significant of hypertension, heart failure reduced EF (EF 20 to 25% with grade 1 diastolic dysfunction) presents with complaints of chronic cough and shortness of breath.  She has been experiencing a persistent cough and shortness of breath since October of the previous year, leading to two hospitalizations and monthly hospital visits. The cough is described as sometimes uncontrollable, particularly at night, and is accompanied by drainage. It worsens at night and is more frequent, regardless of her position. She uses an inhaler for shortness of breath, which provides temporary relief.  She reports that when she comes to the hospital, she does lung and chest X-rays and she has been told her lungs are fine. Formal pulmonary testing has not been conducted yet, but she is scheduled to see a lung specialist later this month. She was previously on Protonix  without improvement in her symptoms. Recently, she was prescribed prednisone  for ten days, which seemed to help as her mucus became clear. However, her mucus is currently more green than clear.  No history of smoking or exposure to smoke. No leg swelling, weight instability, or history of asthma. Her weight is stable at 156.5 pounds, although she wants to gain weight and monitors her weight daily to track fluid retention.  Upon admission into the emergency department patient was noted to be tachypneic with vital signs otherwise maintained.  Labs significant for BNP 496, high-sensitivity troponins 30->38, BUN 33, creatinine 2.21. Chest x-ray showed no acute abnormality.  Patient had been given DuoNeb breathing  treatment and Lasix  20 mg IV.  Review of Systems: As mentioned in the history of present illness. All other systems reviewed and are negative. Past Medical History:  Diagnosis Date   CHF (congestive heart failure) (HCC)    Hypertension    Overweight(278.02)    Sciatica    Secondary cardiomyopathy, unspecified    EF now 50%   Past Surgical History:  Procedure Laterality Date   ABDOMINAL HYSTERECTOMY     BIV ICD INSERTION CRT-D N/A 01/03/2023   Procedure: BIV ICD INSERTION CRT-D;  Surgeon: Waddell Danelle ORN, MD;  Location: [facility] INVASIVE CV LAB;  Service: Cardiovascular;  Laterality: N/A;   Social History:  reports that she has never smoked. She has never used smokeless tobacco. She reports current alcohol use of about 7.0 - 14.0 standard drinks of alcohol per week. She reports that she does not use drugs.  Allergies  Allergen Reactions   Latex Itching   Chlorhexidine  Gluconate Hives    Unknown if this is the cause of the hives, raised areas around lab draw sites. No tape and area wiped with chlorhexidine .    Family History  Problem Relation Age of Onset   Cerebral aneurysm Mother 2       ruptured   Hypertension Mother        severe   Heart attack Father 29   Coronary artery disease Other    Hypertension Brother        severe   Colon cancer Neg Hx    Breast cancer Neg Hx     Prior to Admission medications   Medication Sig Start Date End Date Taking?  Authorizing Provider  Acetaminophen  (TYLENOL  PO) Take 1,000 mg by mouth daily as needed (for pain).    [provider]  albuterol  (VENTOLIN  HFA) 108 (90 Base) MCG/ACT inhaler Inhale 1 puff into the lungs every 6 (six) hours as needed. 10/06/23   [provider]  apixaban  (ELIQUIS ) 5 MG TABS tablet TAKE 1 TABLET(5 MG) BY MOUTH TWICE DAILY 03/08/24   Lavona Agent, MD  atorvastatin  (LIPITOR ) 80 MG tablet Take 1 tablet (80 mg total) by mouth daily. 12/19/21   de Clint Kill, Cortney E, NP  benzonatate  (TESSALON ) 200 MG  capsule Take 200 mg by mouth 3 (three) times daily as needed. 01/29/24   [provider]  carvedilol  (COREG ) 25 MG tablet TAKE 1 TABLET(25 MG) BY MOUTH TWICE DAILY WITH A MEAL 05/11/24   Lavona Agent, MD  empagliflozin  (JARDIANCE ) 10 MG TABS tablet Take 1 tablet (10 mg total) by mouth daily before breakfast. 01/26/24   Milford, Harlene HERO, FNP  furosemide  (LASIX ) 20 MG tablet Take 2 tablets (40 mg total) by mouth daily. Take in case of weight gain 2 to 3 lbs in 24 hrs or 5 lbs in 7 days until weight back to baseline 02/06/24   Arrien, Elidia Sieving, MD  HYDROcodone -acetaminophen  (NORCO/VICODIN) 5-325 MG tablet Take 1 tablet by mouth daily as needed for severe pain (pain score 7-10).    [provider]  isosorbide -hydrALAZINE  (BIDIL ) 20-37.5 MG tablet Take 1 tablet by mouth 3 (three) times daily. 02/06/24   Arrien, Elidia Sieving, MD  zolpidem  (AMBIEN ) 5 MG tablet Take 5 mg by mouth at bedtime as needed for sleep. 01/17/18   [provider]    Physical Exam: Vitals:   05/17/24 0400 05/17/24 0500 05/17/24 0635 05/17/24 0715  BP: (!) 141/94 114/78 (!) 146/80 (!) 143/70  Pulse: 81 80 80 69  Resp: (!) 21 (!) 29 (!) 22 18  Temp:   98.6 F (37 C)   TempSrc:   Oral   SpO2: 96% 99% 98% 100%   Constitutional: Older adult female currently in no acute distress Eyes: PERRL, lids and conjunctivae normal ENMT: Mucous membranes are moist. Normal dentition.  Neck: normal, supple  Respiratory:  patient noted to have some expiratory wheezes on physical exam. Cardiovascular: Regular rate and rhythm, no murmurs / rubs / gallops. Trace lower extremity edema.   Abdomen: no tenderness, no masses palpated. Bowel sounds positive.  Musculoskeletal: no clubbing / cyanosis. No joint deformity upper and lower extremities. Good ROM, no contractures. Normal muscle tone.  Skin: no rashes, lesions, ulcers. No induration Neurologic: CN 2-12 grossly intact.  Strength 5/5 in all 4.  Psychiatric:  Normal judgment and insight. Alert and oriented x 3. Normal mood.   Data Reviewed:  EKG reveals sinus rhythm at 94 bpm with QTc 534.  Reviewed labs, imaging, and pertinent records as documented.  Assessment and Plan:  Hyperreactive airway disease Patient presents with complaints of persistent cough and shortness of breath that seem to be worse at night.  On physical exam patient noted to have expiratory wheezes present.  No prior history of tobacco use. - Admit to a telemetry bed - Trial of Breo for maintenance - Prednisone  - Albuterol  nebs as needed for shortness of breath/wheezing - Will need to follow-up with pulmonary in the outpatient   Heart failure with reduced ejection fraction Acute on chronic.  On physical exam patient with trace lower extremity edema BNP elevated at 430.6 which appears higher than previous.  Last echocardiogram noted EF  to be 20 to 25% with grade 1 diastolic dysfunction when checked last 01/2024. - Strict I&Os and daily weights - Lasix  20 mg IV twice daily.  Reassess and adjust diuresis as needed - Continue beta-blocker  Paroxysmal atrial fibrillation on chronic anticoagulation Patient appears to be in sinus rhythm. - Continue Coreg  and Eliquis   Chronic kidney disease stage IV On admission creatinine noted to be 2.21 with BUN 33.  Baseline creatinine appears to range from 2.2-3. - Continue to monitor kidney function with diuresis   History of CVA - Continue Eliquis  and Lipitor   Normocytic anemia Hemoglobin 10.6 which appears around patient's baseline. - Continue to monitor  DVT prophylaxis: Eliquis  Advance Care Planning:   Code Status: Full Code    Consults: None  Family Communication: daughter updated over the phone.  Severity of Illness: The appropriate patient status for this patient is INPATIENT. Inpatient status is judged to be reasonable and necessary in order to provide the required intensity of service to ensure the patient's safety. The  patient's presenting symptoms, physical exam findings, and initial radiographic and laboratory data in the context of their chronic comorbidities is felt to place them at high risk for further clinical deterioration. Furthermore, it is not anticipated that the patient will be medically stable for discharge from the hospital within 2 midnights of admission.   * I certify that at the point of admission it is my clinical judgment that the patient will require inpatient hospital care spanning beyond 2 midnights from the point of admission due to high intensity of service, high risk for further deterioration and high frequency of surveillance required.*  Author: Maximino DELENA Sharps, MD 05/17/2024 7:56 AM  For on call review [email or web address].

## 2024-05-17 NOTE — ED Notes (Signed)
 Pt resting comfortably and denies further needs at this time. Stretcher locked in lowest position, side rails up x2, call bell within reach.

## 2024-05-17 NOTE — ED Provider Notes (Signed)
 [place] EMERGENCY DEPARTMENT AT [facility] Provider Note   CSN: [id number] Arrival date & time: 05/17/24  0211     Patient presents with: Shortness of Breath   [name] is a 63 y.o. female.  {Add pertinent medical, surgical, social history, OB history to YEP:67052} The history is provided by the patient.  Shortness of Breath She has history of hypertension, heart failure, paroxysmal atrial fibrillation anticoagulated on apixaban , CVA, chronic kidney disease, chronic cough and comes in because of worsening cough and shortness of breath tonight.  She states that this happens fairly frequently and she usually feels better after getting a nebulizer treatment and an injection of furosemide  in the emergency department.  She had used her inhaler at home without any relief.  She denies chest pain, heaviness, tightness, or pressure.  She denies fever or chills.  She has been checking daily weights and states that her weight has actually been dropping.   Prior to Admission medications   Medication Sig Start Date End Date Taking? Authorizing Provider  Acetaminophen  (TYLENOL  PO) Take 1,000 mg by mouth daily as needed (for pain).    [provider]  albuterol  (VENTOLIN  HFA) 108 (90 Base) MCG/ACT inhaler Inhale 1 puff into the lungs every 6 (six) hours as needed. 10/06/23   [provider]  apixaban  (ELIQUIS ) 5 MG TABS tablet TAKE 1 TABLET(5 MG) BY MOUTH TWICE DAILY 03/08/24   Lavona Agent, MD  atorvastatin  (LIPITOR ) 80 MG tablet Take 1 tablet (80 mg total) by mouth daily. 12/19/21   de Clint Kill, Cortney E, NP  benzonatate  (TESSALON ) 200 MG capsule Take 200 mg by mouth 3 (three) times daily as needed. 01/29/24   [provider]  carvedilol  (COREG ) 25 MG tablet TAKE 1 TABLET(25 MG) BY MOUTH TWICE DAILY WITH A MEAL 05/11/24   Lavona Agent, MD  empagliflozin  (JARDIANCE ) 10 MG TABS tablet Take 1 tablet (10 mg total) by mouth daily before breakfast. 01/26/24    Milford, Harlene HERO, FNP  furosemide  (LASIX ) 20 MG tablet Take 2 tablets (40 mg total) by mouth daily. Take in case of weight gain 2 to 3 lbs in 24 hrs or 5 lbs in 7 days until weight back to baseline 02/06/24   Arrien, Elidia Sieving, MD  HYDROcodone -acetaminophen  (NORCO/VICODIN) 5-325 MG tablet Take 1 tablet by mouth daily as needed for severe pain (pain score 7-10).    [provider]  isosorbide -hydrALAZINE  (BIDIL ) 20-37.5 MG tablet Take 1 tablet by mouth 3 (three) times daily. 02/06/24   Arrien, Elidia Sieving, MD  zolpidem  (AMBIEN ) 5 MG tablet Take 5 mg by mouth at bedtime as needed for sleep. 01/17/18   [provider]    Allergies: Latex and Chlorhexidine  gluconate    Review of Systems  Respiratory:  Positive for shortness of breath.   All other systems reviewed and are negative.   Updated Vital Signs BP (!) 148/99 (BP Location: Right Arm)   Pulse 86   Temp 98.6 F (37 C) (Oral)   Resp (!) 22   SpO2 97%   Physical Exam Vitals and nursing note reviewed.   63 year old female, resting comfortably and in no acute distress. Vital signs are significant for elevated blood pressure and borderline elevated respiratory rate.  She has a dry cough. Oxygen saturation is 97%, which is normal. Head is normocephalic and atraumatic. PERRLA, EOMI. Oropharynx is clear. Neck is nontender and supple without adenopathy or JVD. Back is nontender and there is no CVA  tenderness.  There is no presacral edema. Lungs are clear without rales, wheezes, or rhonchi.  There is a slightly prolonged exhalation phase. Chest is nontender. Heart has regular rate and rhythm without murmur. Abdomen is soft, flat, nontender. Extremities have trace edema, full range of motion is present. Skin is warm and dry without rash. Neurologic: Awake and alert, moves all extremities equally.  (all labs ordered are listed, but only abnormal results are displayed) Labs Reviewed  CBC WITH DIFFERENTIAL/PLATELET  - Abnormal; Notable for the following components:      Result Value   RBC 3.57 (*)    Hemoglobin 10.6 (*)    HCT 33.6 (*)    Eosinophils Absolute 0.8 (*)    All other components within normal limits  BRAIN NATRIURETIC PEPTIDE - Abnormal; Notable for the following components:   B Natriuretic Peptide 496.0 (*)    All other components within normal limits  BASIC METABOLIC PANEL WITH GFR - Abnormal; Notable for the following components:   CO2 20 (*)    Glucose, Bld 126 (*)    BUN 33 (*)    Creatinine, Ser 2.21 (*)    Calcium  8.5 (*)    GFR, Estimated 25 (*)    All other components within normal limits  TROPONIN I (HIGH SENSITIVITY) - Abnormal; Notable for the following components:   Troponin I (High Sensitivity) 32 (*)    All other components within normal limits  TROPONIN I (HIGH SENSITIVITY) - Abnormal; Notable for the following components:   Troponin I (High Sensitivity) 38 (*)    All other components within normal limits    EKG: EKG Interpretation Date/Time:  Monday May 17 2024 02:19:00 EDT Ventricular Rate:  94 PR Interval:  133 QRS Duration:  153 QT Interval:  427 QTC Calculation: 534 R Axis:   -10  Text Interpretation: Sinus rhythm Ventricular premature complex Left bundle branch block When compared with ECG of 04/26/2024, Premature ventricular complexes are now present Confirmed by Raford Lenis ([id number]) on 05/17/2024 2:21:18 AM  Radiology: [facility] Chest Port 1 View Result Date: 05/17/2024 CLINICAL DATA:  Cough and shortness of breath EXAM: PORTABLE CHEST 1 VIEW COMPARISON:  04/25/2024 FINDINGS: Cardiac shadow is mildly enlarged but stable. Defibrillator is again seen. Lungs are well aerated without focal infiltrate or sizable effusion. No bony abnormality is noted. IMPRESSION: No acute abnormality noted. Electronically Signed   By: Oneil Devonshire M.D.   On: 05/17/2024 02:54    {Document cardiac monitor, telemetry assessment procedure when appropriate:32947} Procedures  Cardiac  monitor shows normal sinus rhythm, per my interpretation. Medications Ordered in the ED  ipratropium-albuterol  (DUONEB) 0.5-2.5 (3) MG/3ML nebulizer solution 3 mL (3 mLs Nebulization Given 05/17/24 0234)  furosemide  (LASIX ) injection 20 mg (20 mg Intravenous Given 05/17/24 0405)      {Click here for ABCD2, HEART and other calculators REFRESH Note before signing:1}                              Medical Decision Making Amount and/or Complexity of Data Reviewed Labs: ordered. Radiology: ordered.  Risk Prescription drug management. Decision regarding hospitalization.   Cough with shortness of breath.  This is a presentation which has a wide range of treatment options and carries with it a high risk of morbidity and complications.  Differential diagnosis includes, but is not limited to, heart failure exacerbation, ACS, pneumonia, bronchitis, COPD exacerbation, asthma exacerbation.  Patient's cough as I am seeing her seems  to be more of an upper airway cough.  I have reviewed her past records, and note cardiology office visit on 03/25/2024 which states she has ischemic cardiomyopathy and is on maximum medical management and furosemide  dose is being carefully monitored because of renal insufficiency.  She had an ED visit for similar symptoms on 04/25/2024 at which time she received a nebulizer treatment with albuterol  and ipratropium and some intravenous furosemide .  Echocardiogram on 01/02/2024 showed left ventricular ejection fraction of 20% with global hypokinesis and indeterminate diastolic parameters.  Monitoring above-noted treatment, she had significant relief but did request an additional nebulizer treatment.  Chest x-ray shows no acute process.  I have independently viewed the image, and agree with the radiologist's interpretation.  I have reviewed her electrocardiogram, my interpretation is sinus rhythm with occasional PVC, left bundle branch block unchanged from prior except for presence of PVC  today.  I have reviewed her laboratory tests, and my interpretation is stable anemia, renal insufficiency which is actually improved over baseline, elevated random glucose level which will need to be followed as an outpatient, mild to moderately elevated BNP consistent with heart failure, mildly elevated initial troponin which increased further on repeat.  Because of rising troponin, I feel she will need to be observed in the hospital.  I have discussed case with Dr. PIERRETTE of [facility] who agrees to admit the patient, I have also consulted cardiology to evaluate the patient.  {Document critical care time when appropriate  Document review of labs and clinical decision tools ie CHADS2VASC2, etc  Document your independent review of radiology images and any outside records  Document your discussion with family members, caretakers and with consultants  Document social determinants of health affecting pt's care  Document your decision making why or why not admission, treatments were needed:32947:::1}   Final diagnoses:  Acute on chronic systolic heart failure (HCC)  Elevated troponin  Renal insufficiency  Normochromic normocytic anemia    ED Discharge Orders     None

## 2024-05-17 NOTE — ED Triage Notes (Addendum)
 Sob with cough. Given duoneb. Improved. States this happens and she comes to ED and gets lasix  and treatments and feels better. Limits her fluid and takes her lasix  per patient. VSS. Pt was at 88% RA when EMS arrived on scene. Speaking in complete sentences.

## 2024-05-17 NOTE — ED Notes (Signed)
 CCMD called.

## 2024-05-18 DIAGNOSIS — I5020 Unspecified systolic (congestive) heart failure: Secondary | ICD-10-CM | POA: Diagnosis not present

## 2024-05-18 LAB — CBC
HCT: 29.7 % — ABNORMAL LOW (ref 36.0–46.0)
Hemoglobin: 9.7 g/dL — ABNORMAL LOW (ref 12.0–15.0)
MCH: 29.6 pg (ref 26.0–34.0)
MCHC: 32.7 g/dL (ref 30.0–36.0)
MCV: 90.5 fL (ref 80.0–100.0)
Platelets: 210 K/uL (ref 150–400)
RBC: 3.28 MIL/uL — ABNORMAL LOW (ref 3.87–5.11)
RDW: 15 % (ref 11.5–15.5)
WBC: 7 K/uL (ref 4.0–10.5)
nRBC: 0 % (ref 0.0–0.2)

## 2024-05-18 LAB — BASIC METABOLIC PANEL WITH GFR
Anion gap: 13 (ref 5–15)
BUN: 40 mg/dL — ABNORMAL HIGH (ref 6–20)
CO2: 22 mmol/L (ref 22–32)
Calcium: 9.1 mg/dL (ref 8.9–10.3)
Chloride: 108 mmol/L (ref 98–111)
Creatinine, Ser: 2.35 mg/dL — ABNORMAL HIGH (ref 0.44–1.00)
GFR, Estimated: 23 mL/min — ABNORMAL LOW (ref >60)
Glucose, Bld: 104 mg/dL — ABNORMAL HIGH (ref 70–99)
Potassium: 3 mmol/L — ABNORMAL LOW (ref 3.5–5.1)
Sodium: 143 mmol/L (ref 135–145)

## 2024-05-18 MED ORDER — ALBUTEROL SULFATE 0.63 MG/3ML IN NEBU: 1.0000 | INHALATION_SOLUTION | Freq: Four times a day (QID) | RESPIRATORY_TRACT | 0 refills | Status: AC | PRN

## 2024-05-18 MED ORDER — PREDNISONE 20 MG PO TABS: 40.0000 mg | ORAL_TABLET | Freq: Every day | ORAL | 0 refills | Status: DC

## 2024-05-18 NOTE — Progress Notes (Signed)
 Heart Failure Navigator Progress Note  Assessed for Heart & Vascular TOC clinic readiness.  Patient does not meet criteria due to being discharged from the ED, has a scheduled [facility] appointment on 05/25/2024. No HF TOC. .   Navigator will sign off at this time.   Stephane Haddock, BSN, Scientist, clinical (histocompatibility and immunogenetics) Only

## 2024-05-18 NOTE — Discharge Summary (Signed)
 [name], BSN, RN, [facility] [phone] Pt qualifies for DME [facility]) nebulizer.  DME  ordered through [employer].  Ryan of [employer] notified to deliver DME to pt room prior to D/C home.

## 2024-05-18 NOTE — ED Notes (Signed)
 Pt nebulizer arrived and delivered. Pt is now discharged home.

## 2024-05-18 NOTE — ED Notes (Signed)
 CCMD contacted to transfer pt from 46 to H20.

## 2024-05-18 NOTE — ED Notes (Signed)
 CCMD called.

## 2024-05-18 NOTE — Discharge Summary (Incomplete)
 Physician Discharge Summary   Patient: [name] MRN: [id number] DOB: Feb 05, 1961  Admit date:     05/17/2024  Discharge date: 05/18/24  Discharge Physician: Bernardino KATHEE Come   PCP: Larnell Hamilton, MD   Recommendations at discharge:  Follow up with cardiology as already scheduled in 1 week. Note pt's troponin levels (peak at 38), though she has no chest pain and elevation is chronic. No inpatient consult/work up was pursued.  Follow up with pulmonology as scheduled 8/5. This is the patient's primary complaint > a chronic dry cough which at times, roughly ~monthly, leads to shortness of breath prompting ED visits. Trialing nebulizer at home and has improved dramatically with steroid and lasix .   Discharge Diagnoses: Principal Problem:   Heart failure with reduced ejection fraction (HCC) Active Problems:   Hyperactive airway disease   PAF (paroxysmal atrial fibrillation) (HCC)   CKD (chronic kidney disease), stage IV (HCC)   History of CVA (cerebrovascular accident)   Normocytic anemia   Hospital Course: *** Readmitted for similar presentation, acute shortness of breath associated with dry chronic cough. Admission was requested for troponin elevation which has remained flat without chest pain, suspect demand ischemia in pt with LVEF 20-25% and respiratory distress at the time.  Assessment and Plan: ***  Consultants: *** Procedures performed: ***  Disposition: {Plan; Disposition:26390} Diet recommendation:  {Diet_Plan:26776} DISCHARGE MEDICATION: Allergies as of 05/18/2024       Reactions   Chlorhexidine  Gluconate Hives   Unknown if this is the cause of the hives, raised areas around lab draw sites. No tape and area wiped with chlorhexidine .   Latex Itching        Medication List     TAKE these medications    albuterol  108 (90 Base) MCG/ACT inhaler Commonly known as: VENTOLIN  HFA Inhale 1 puff into the lungs every 6 (six) hours as needed. What changed: Another  medication with the same name was added. Make sure you understand how and when to take each.   albuterol  0.63 MG/3ML nebulizer solution Commonly known as: ACCUNEB  Take 3 mLs (0.63 mg total) by nebulization every 6 (six) hours as needed for wheezing or shortness of breath. What changed: You were already taking a medication with the same name, and this prescription was added. Make sure you understand how and when to take each.   atorvastatin  80 MG tablet Commonly known as: LIPITOR  Take 1 tablet (80 mg total) by mouth daily.   benzonatate  200 MG capsule Commonly known as: TESSALON  Take 200 mg by mouth 3 (three) times daily as needed.   carvedilol  25 MG tablet Commonly known as: COREG  TAKE 1 TABLET(25 MG) BY MOUTH TWICE DAILY WITH A MEAL   cetirizine 10 MG tablet Commonly known as: ZYRTEC Take 10 mg by mouth daily.   cholecalciferol 25 MCG (1000 UNIT) tablet Commonly known as: VITAMIN D3 Take 1,000 Units by mouth daily.   Eliquis  5 MG Tabs tablet Generic drug: apixaban  TAKE 1 TABLET(5 MG) BY MOUTH TWICE DAILY   empagliflozin  10 MG Tabs tablet Commonly known as: Jardiance  Take 1 tablet (10 mg total) by mouth daily before breakfast.   fluticasone  50 MCG/ACT nasal spray Commonly known as: FLONASE  Place 2 sprays into both nostrils daily.   furosemide  20 MG tablet Commonly known as: LASIX  Take 2 tablets (40 mg total) by mouth daily. Take in case of weight gain 2 to 3 lbs in 24 hrs or 5 lbs in 7 days until weight back to baseline   HYDROcodone -acetaminophen   5-325 MG tablet Commonly known as: NORCO/VICODIN Take 1 tablet by mouth daily as needed for severe pain (pain score 7-10).   isosorbide -hydrALAZINE  20-37.5 MG tablet Commonly known as: BIDIL  Take 1 tablet by mouth 3 (three) times daily.   multivitamin tablet Take 1 tablet by mouth daily.   predniSONE  20 MG tablet Commonly known as: DELTASONE  Take 2 tablets (40 mg total) by mouth daily with breakfast for 3 days. Start  taking on: May 19, 2024   TYLENOL  PO Take 1,000 mg by mouth daily as needed (for pain).   zolpidem  5 MG tablet Commonly known as: AMBIEN  Take 5 mg by mouth at bedtime as needed for sleep.               Durable Medical Equipment  (From admission, onward)           Start     Ordered   05/18/24 0826  For home use only DME Nebulizer machine  Once       Question Answer Comment  Patient needs a nebulizer to treat with the following condition COPD (chronic obstructive pulmonary disease) (HCC)   Patient needs a nebulizer to treat with the following condition Asthma   Length of Need 6 Months   Additional equipment included Administration kit      05/18/24 0826            Follow-up Information     Larnell Hamilton, MD Follow up.   Specialty: Internal Medicine Contact information: [address] [place] [address] [phone]                Discharge Exam: There were no vitals filed for this visit. ***  Condition at discharge: {DC Condition:26389}  The results of significant diagnostics from this hospitalization (including imaging, microbiology, ancillary and laboratory) are listed below for reference.   Imaging Studies: DG Chest Port 1 View Result Date: 05/17/2024 CLINICAL DATA:  Cough and shortness of breath EXAM: PORTABLE CHEST 1 VIEW COMPARISON:  04/25/2024 FINDINGS: Cardiac shadow is mildly enlarged but stable. Defibrillator is again seen. Lungs are well aerated without focal infiltrate or sizable effusion. No bony abnormality is noted. IMPRESSION: No acute abnormality noted. Electronically Signed   By: Oneil Devonshire M.D.   On: 05/17/2024 02:54   DG Chest 2 View Result Date: 04/25/2024 CLINICAL DATA:  Shortness of breath EXAM: CHEST - 2 VIEW COMPARISON:  Chest x-ray 01/31/2024 FINDINGS: Left-sided ICD is present. The heart is enlarged. There central pulmonary vascular congestion. The lungs are otherwise clear. There is no pleural effusion or  pneumothorax. No acute fractures are seen. IMPRESSION: Cardiomegaly with central pulmonary vascular congestion. Electronically Signed   By: Greig Pique M.D.   On: 04/25/2024 21:44    Microbiology: Results for orders placed or performed during the hospital encounter of 05/17/24  Respiratory (~20 pathogens) panel by PCR     Status: None   Collection Time: 05/17/24  7:16 PM   Specimen: Nasopharyngeal Swab; Respiratory  Result Value Ref Range Status   Adenovirus NOT DETECTED NOT DETECTED Final   Coronavirus 229E NOT DETECTED NOT DETECTED Final    Comment: (NOTE) The Coronavirus on the Respiratory Panel, DOES NOT test for the novel  Coronavirus (2019 nCoV)    Coronavirus HKU1 NOT DETECTED NOT DETECTED Final   Coronavirus NL63 NOT DETECTED NOT DETECTED Final   Coronavirus OC43 NOT DETECTED NOT DETECTED Final   Metapneumovirus NOT DETECTED NOT DETECTED Final   Rhinovirus / Enterovirus NOT DETECTED NOT DETECTED Final  Influenza A NOT DETECTED NOT DETECTED Final   Influenza B NOT DETECTED NOT DETECTED Final   Parainfluenza Virus 1 NOT DETECTED NOT DETECTED Final   Parainfluenza Virus 2 NOT DETECTED NOT DETECTED Final   Parainfluenza Virus 3 NOT DETECTED NOT DETECTED Final   Parainfluenza Virus 4 NOT DETECTED NOT DETECTED Final   Respiratory Syncytial Virus NOT DETECTED NOT DETECTED Final   Bordetella pertussis NOT DETECTED NOT DETECTED Final   Bordetella Parapertussis NOT DETECTED NOT DETECTED Final   Chlamydophila pneumoniae NOT DETECTED NOT DETECTED Final   Mycoplasma pneumoniae NOT DETECTED NOT DETECTED Final    Comment: Performed at [facility], [address], [place], [place] [address]    Labs: CBC: Recent Labs  Lab 05/17/24 0234 05/18/24 0621  WBC 6.9 7.0  NEUTROABS 3.9  --   HGB 10.6* 9.7*  HCT 33.6* 29.7*  MCV 94.1 90.5  PLT 200 210   Basic Metabolic Panel: Recent Labs  Lab 05/17/24 0234 05/18/24 0621  NA 139 143  K 3.6 3.0*  CL 106 108  CO2 20* 22   GLUCOSE 126* 104*  BUN 33* 40*  CREATININE 2.21* 2.35*  CALCIUM  8.5* 9.1   Liver Function Tests: No results for input(s): AST, ALT, ALKPHOS, BILITOT, PROT, ALBUMIN in the last 168 hours. CBG: No results for input(s): GLUCAP in the last 168 hours.  Discharge time spent: {LESS THAN/GREATER UYJW:73611} 30 minutes.  Signed: Bernardino KATHEE Come, MD [facility] 05/18/2024

## 2024-05-19 NOTE — Progress Notes (Unsigned)
 error

## 2024-05-24 NOTE — Progress Notes (Unsigned)
 Cardiology Office Note:  .   Date:  05/25/2024  ID:  [name], DOB 20-Mar-1961, MRN [id number] PCP: [name] Hamilton, MD  [facility] Providers Cardiologist:  [name] Schilling, MD Electrophysiologist:  [name] Birmingham, MD {  History of Present Illness: .   [name] is a 63 y.o. female  with PMHx of chronic HFrEF (LVEF generally 20-30% since 2015, most recent echo on 01/17/2024 indicated LVEF of 20 to 25%, severely decreased LV function, moderate asymmetric LVH, G1DD, trivial TVR ),  BiV ICD insertion on 01/03/2023 with Dr. Birmingham, nonischemic cardiomyopathy, paroxysmal atrial fibrillation on Eliquis , HTN, CKD stage IV, acute left CVA in 2023 who reports to [facility] office for follow up.   Last seen in heartcare 03/25/2024 with Dr. Schilling for follow-up.  Reported chronic cough.  Denied any other cardiac symptoms.  Noted recently started Lasix  40 mg every day with OTC potassium by EP clinic. Continue current medication  regimen. Follow up BMP showed Cr 2.21.   ED Visit 6/22-23/2025 for cough and SOB. CXR showed cardiomegaly with central pulmonary vascular congestion. K was 3.2. BNP was elevated 436. Patient was offered admission for diuresis but declined. Treated with DuoNeb, IV Lasix  20 mg and KCL 40 MEQ.   Recent hospitalization 7/14-15/2025 for worsening cough and SOB.  Felt cough was secondary to upper airway.  CXR negative. EKG showed NSR with occasional PVC and chronic LBBB.  BNP 496, Tn 32>38, CR 2.21. K 3.0.  Admitted for observation due to rising troponin.-Cardiology was consulted but no consult note per chart review.  Treated with DuoNeb and Lasix  IV 20 mg.  Discharged on prednisone  and increased albuterol  every 6 hours.  Continued on Eliquis  5 mg BID, Coreg  25 mg BID, Jardiance  10 mg daily, Lasix  40 mg daily, Bidil  20-37.5 TID and Lipitor  80 mg  Today, reports significant improvement in SOB and cough since being on prednisone .  Denied any SOB, chest pain, edema, dizziness, syncope,  palpitations. Reports compliance with medications. Mainly eats home cooked meals with low sodium. 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 Able to walk around grocery store without any exertional symptoms. Denies tobacco use/Binging ETOH/drug use.  ROS: 10 point review of system has been reviewed and considered negative except ones been listed in the HPI.   Studies Reviewed: SABRA   ECHO IMPRESSIONS 01/2024  1. Left ventricular ejection fraction, by estimation, is 20 to 25%. The  left ventricle has severely decreased function. There is moderate  asymmetric left ventricular hypertrophy of the inferior segment. Left  ventricular diastolic parameters are  consistent with Grade I diastolic dysfunction (impaired relaxation).   2. Right ventricular systolic function is normal. The right ventricular  size is normal. Tricuspid regurgitation signal is inadequate for assessing  PA pressure.   3. No evidence of mitral valve regurgitation.   4. The aortic valve was not well visualized. Aortic valve regurgitation  is not visualized.   5. The inferior vena cava is normal in size with greater than 50%  respiratory variability, suggesting right atrial pressure of 3 mmHg.   Comparison(s): No significant change from prior study.  Risk Assessment/Calculations:    CHA2DS2-VASc Score = 3   This indicates a 3.2% annual risk of stroke. The patient's score is based upon: CHF History: 1 HTN History: 1 Diabetes History: 0 Stroke History: 0 Vascular Disease History: 0 Age Score: 0 Gender Score: 1  Physical Exam:   VS:  BP (!) 142/92 (BP Location: Right Arm, Patient Position: Sitting, Cuff Size: Normal)  Pulse 88   Ht 5' 5 (1.651 m)   Wt 162 lb 3.2 oz (73.6 kg)   SpO2 98%   BMI 26.99 kg/m    Wt Readings from Last 3 Encounters:  05/25/24 162 lb 3.2 oz (73.6 kg)  04/25/24 160 lb 15 oz (73 kg)  03/25/24 161 lb (73 kg)    GEN: Well nourished, well developed in no acute distress while sitting in chair.  NECK: No JVD; No  carotid bruits CARDIAC: RRR, no murmurs, rubs, gallops RESPIRATORY:  Clear to auscultation without rales, wheezing or rhonchi  ABDOMEN: Soft, non-tender, non-distended EXTREMITIES:  No edema; No deformity   ASSESSMENT AND PLAN: .   HFrEF (heart failure with reduced ejection fraction) (HCC) Non-ischemic cardiomyopathy (HCC) ICD (implantable cardioverter-defibrillator) in place LVEF 2015 25 to 30%, 2018 50 to 55%, 2023, 11/2022, 01/17/2024 20-25% Echocardiogram on 01/17/2024 indicated LVEF of 20 to 25%, severely decreased LV function, moderate asymmetric LVH, G1DD, trivial TVR  cMRI 12/2021 without evidence of prior MI or amyloid  Given persistent reduction in EF she was referred to Dr. Waddell for consideration of CRT.  BiV ICD insertion on 01/03/2023 with Dr. Waddell. Last ICD interrogation 04/2024 showed normal function with 1 new VT-NS episode for 12 beats.  Denies any HF symptoms. Euvolemic on exam.  Continue on Coreg  25 mg BID, Jardiance  10 mg daily, Lasix  40 mg daily, Bidil  20-37.5 TID.  Previously trialed on Farxiga but elevated creatinine following initiation and was discontinued. Not on ACE/ARB/Entresto  with renal function.   Cough, unspecified type Reports significant improvement with cough since being on prednisone  after ED visit.  Previously referred to pulmonary but canceled appointment in 03/2024. Upcoming appointment 06/08/2024.  Strongly encouraged to follow-up with appointment as scheduled. Patient agrees and plans to follow up.  If no clear etiology then would suggest ENT referral  Paroxysmal atrial fibrillation (HCC) On 01/20/2023 after a remote device check it was alerted that she had episodes of atrial fibrillation.  Reviewed recent EKG on 05/17/2024: no reoccurrence of afib Denies any palpitations.  Regular rate and rhythm on exam.  Continue on Eliquis  5 mg BID and Coreg  as above.   Essential hypertension BP this OV, mildly elevated: 130/92, repeat BP: 142/90 Reports Home BP  generally 120-150/60-90.   Managed by GDMT as above.  BP log x 2 week.  If BP remains elevated then can consider increasing Bidil .   HLD, LDL goal < 70 04/2024 LDL 85 Continue on Lipitor  80 mg  Order Zetia  10 mg daily.  Follow up FLP in 09/2024.   Hypokalemia Recent ED labs 05/18/2024: K 3.0. She left prior to receiving ordered KCL supplement. Called to discuss the day after appointment 05/26/2024: Reviewed history of low K in recent ED visit. Patient is not taking any KCL supplements. Order BMP. Patient is not able to go to get labs today due to work schedule. Encouraged to get labs as soon as possible. Based on labs, can decide if KCL supplement is needed.    Chronic kidney disease (CKD), stage IV (severe) Referred to nephrology with Cr of 3.  Last seen 02/07/2024 but unable to review note. Per patient, nephrology recommended healthy diet and avoiding sodas. She has been following those recommendation most of the time.  05/2024 Cr 2.35 (baseline Cr 2.2 -2.3) Defer lasix  management to nephrology unless patient is volume overloaded.  Dispo: Follow up in September with Angie.   Signed, Lorette CINDERELLA Kapur, PA-C

## 2024-05-25 ENCOUNTER — Encounter: Payer: Self-pay | Admitting: Physician Assistant

## 2024-05-25 ENCOUNTER — Ambulatory Visit: Attending: Cardiology | Admitting: Physician Assistant

## 2024-05-25 VITALS — BP 142/92 | HR 88 | Ht 65.0 in | Wt 162.2 lb

## 2024-05-25 DIAGNOSIS — I5020 Unspecified systolic (congestive) heart failure: Secondary | ICD-10-CM | POA: Diagnosis not present

## 2024-05-25 DIAGNOSIS — R059 Cough, unspecified: Secondary | ICD-10-CM

## 2024-05-25 DIAGNOSIS — I10 Essential (primary) hypertension: Secondary | ICD-10-CM

## 2024-05-25 DIAGNOSIS — N184 Chronic kidney disease, stage 4 (severe): Secondary | ICD-10-CM

## 2024-05-25 DIAGNOSIS — Z95810 Presence of automatic (implantable) cardiac defibrillator: Secondary | ICD-10-CM | POA: Diagnosis not present

## 2024-05-25 DIAGNOSIS — I480 Paroxysmal atrial fibrillation: Secondary | ICD-10-CM

## 2024-05-25 DIAGNOSIS — E785 Hyperlipidemia, unspecified: Secondary | ICD-10-CM

## 2024-05-25 DIAGNOSIS — I428 Other cardiomyopathies: Secondary | ICD-10-CM | POA: Diagnosis not present

## 2024-05-25 MED ORDER — EZETIMIBE 10 MG PO TABS: 10.0000 mg | ORAL_TABLET | Freq: Every day | ORAL | 1 refills | Status: DC

## 2024-05-25 NOTE — Patient Instructions (Addendum)
 Medication Instructions:  Start Zetia  10mg . Take one tablet daily.  Monitor your blood pressure for 2 weeks. Bring blood pressure log for your next visit.   *If you need a refill on your cardiac medications before your next appointment, please call your pharmacy*   Lab Work: Return in November for fasting labs..............SABRA LIPID PANEL If you have labs (blood work) drawn today and your tests are completely normal, you will receive your results only by: MyChart Message (if you have MyChart) OR A paper copy in the mail If you have any lab test that is abnormal or we need to change your treatment, we will call you to review the results.   Testing/Procedures: No procedures were ordered during today's visit.    Follow-Up: At [facility], you and your health needs are our priority.  As part of our continuing mission to provide you with exceptional heart care, we have created designated Provider Care Teams.  These Care Teams include your primary Cardiologist (physician) and Advanced Practice Providers (APPs -  Physician Assistants and Nurse Practitioners) who all work together to provide you with the care you need, when you need it.  We recommend signing up for the patient portal called MyChart.  Sign up information is provided on this After Visit Summary.  MyChart is used to connect with patients for Virtual Visits (Telemedicine).  Patients are able to view lab/test results, encounter notes, upcoming appointments, etc.  Non-urgent messages can be sent to your provider as well.   To learn more about what you can do with MyChart, go to [email or web address].    Provider:   Jon Hails, PA-C          Other Instructions Thank you for choosing [facility]!      BLOOD PRESSURE READINGS TAKE YOUR BLOOD PRESSURE AT LEAST 1 HOUR AFTER TAKING YOUR MEDICATION BRING LOG WITH YOU TO YOUR FOLLOW UP APPOINTMENT FOR REVIEW   DATE TIME BP HR COMMENT DATE  TIME  BP HR COMMENT  DATE TIME BP HR COMMENT DATE  TIME  BP HR COMMENT

## 2024-06-01 NOTE — Progress Notes (Signed)
 Remote ICD transmission.

## 2024-06-03 ENCOUNTER — Ambulatory Visit

## 2024-06-03 NOTE — Progress Notes (Signed)
 Patient presents today to pick up custom molded foot orthotics, diagnosed with PF by Dr. Tobie.   Orthotics were dispensed and fit was satisfactory. Reviewed instructions for break-in and wear. Written instructions given to patient.  Patient will follow up as needed.   Lolita Schultze Cped, Cfo<Cfm

## 2024-06-08 ENCOUNTER — Encounter: Payer: Self-pay | Admitting: Pulmonary Disease

## 2024-06-08 ENCOUNTER — Ambulatory Visit: Admitting: Pulmonary Disease

## 2024-06-08 VITALS — BP 160/90 | HR 70 | Temp 98.7°F | Ht 65.0 in | Wt 162.0 lb

## 2024-06-08 DIAGNOSIS — I5022 Chronic systolic (congestive) heart failure: Secondary | ICD-10-CM

## 2024-06-08 DIAGNOSIS — J45998 Other asthma: Secondary | ICD-10-CM | POA: Diagnosis not present

## 2024-06-08 DIAGNOSIS — I428 Other cardiomyopathies: Secondary | ICD-10-CM | POA: Diagnosis not present

## 2024-06-08 DIAGNOSIS — R0602 Shortness of breath: Secondary | ICD-10-CM

## 2024-06-08 LAB — NITRIC OXIDE: Nitric Oxide: 28

## 2024-06-08 MED ORDER — TRELEGY ELLIPTA 100-62.5-25 MCG/ACT IN AEPB: 1.0000 | INHALATION_SPRAY | Freq: Every day | RESPIRATORY_TRACT | 11 refills | Status: DC

## 2024-06-08 MED ORDER — TRELEGY ELLIPTA 100-62.5-25 MCG/ACT IN AEPB: 1.0000 | INHALATION_SPRAY | Freq: Every day | RESPIRATORY_TRACT | Status: DC

## 2024-06-08 NOTE — Progress Notes (Signed)
 Subjective:    Patient ID: [name], female    DOB: 1961-10-30, 63 y.o.   MRN: [id number]  Patient Care Team: Larnell Hamilton, MD as PCP - General (Internal Medicine) Lavona Agent, MD as PCP - Cardiology (Cardiology) Waddell Danelle ORN, MD as PCP - Electrophysiology (Cardiology)  Chief Complaint  Patient presents with   Consult    Cough with shortness of breath.     BACKGROUND: [name] is a 63 year old lifelong never smoker with a history as noted below who presents for evaluation of cough and shortness of breath of over 6 months duration.  She is referred by Dr. Agent Lavona.  Her primary care physician is Dr. Hamilton Larnell.   HPI Discussed the use of AI scribe software for clinical note transcription with the patient, who gave verbal consent to proceed.  History of Present Illness   [name] is a 63 year old female with CHF who presents with shortness of breath and cough.  She was kindly referred by Dr. Agent Lavona.  She has been experiencing a persistent non-productive cough and drainage since late September or early October of last year. The cough is severe, leading to episodes of shortness of breath that have resulted in two to three hospitalizations. These episodes are so intense that she feels she 'can't breathe'.  Initially, she suspected an upper respiratory infection due to significant drainage, but the cough remained non-productive. Minimal activity, such as walking to the bathroom, can trigger her symptoms. She has a history of CHF but believes her current symptoms are not related to her heart condition.  Her last echocardiogram was on 17 January 2024 and it shows an ejection fraction of 20 to 25% with moderate asymmetric LVH.  She also has diastolic dysfunction.  During hospital visits, she was treated with prednisone , which significantly alleviated her symptoms, reducing the severity of her cough and shortness of breath. However, once she stops taking  prednisone , her symptoms return, including heavy mucus production. She uses albuterol  as a rescue inhaler but finds it only partially effective, as she continues to cough after use, although not as long or hard. She completed her last course of prednisone  about ten days ago.  She denies any history of asthma as a child and has never smoked.     Review of Systems A 10 point review of systems was performed and it is as noted above otherwise negative.   Past Medical History:  Diagnosis Date   CHF (congestive heart failure) (HCC)    Hypertension    Overweight(278.02)    Sciatica    Secondary cardiomyopathy, unspecified    EF now 50%    Past Surgical History:  Procedure Laterality Date   ABDOMINAL HYSTERECTOMY     BIV ICD INSERTION CRT-D N/A 01/03/2023   Procedure: BIV ICD INSERTION CRT-D;  Surgeon: Waddell Danelle ORN, MD;  Location: [facility] INVASIVE CV LAB;  Service: Cardiovascular;  Laterality: N/A;    Patient Active Problem List   Diagnosis Date Noted   Heart failure with reduced ejection fraction (HCC) 05/17/2024   Reactive airway disease 05/17/2024   Normocytic anemia 05/17/2024   Acute renal failure with acute tubular necrosis superimposed on stage 4 chronic kidney disease (HCC) 02/04/2024   PAF (paroxysmal atrial fibrillation) (HCC) 02/04/2024   NSVT (nonsustained ventricular tachycardia) (HCC) 02/03/2024   Heart failure (HCC) 01/31/2024   Hyperactive airway disease 01/31/2024   Acute combined systolic and diastolic congestive heart failure (HCC) 01/16/2024   CKD (chronic  kidney disease), stage IV (HCC) 01/16/2024   History of CVA (cerebrovascular accident) 01/16/2024   Metabolic acidosis 01/16/2024   Acute exacerbation of CHF (congestive heart failure) (HCC) 01/16/2024   URI (upper respiratory infection) 01/16/2024   Paroxysmal A-fib (HCC) 02/04/2023   Hypercoagulable state due to paroxysmal atrial fibrillation (HCC) 02/04/2023   Acute ischemic stroke (HCC) 12/13/2021    Hyperlipidemia 02/05/2019   Advice given about COVID-19 virus infection 02/05/2019   CKD (chronic kidney disease), stage III (HCC) 02/03/2018   Insomnia 10/14/2014   At risk for sudden cardiac death 08/29/14   Chronic systolic HF (heart failure) (HCC) 08/29/2014   Essential hypertension, malignant 08/08/2014   Non compliance with medical treatment 08/07/2014   Normal coronary arteries 2006 08/07/2014   Renal insufficiency- unclear if chronic or not 08/07/2014   Acute on chronic systolic CHF (congestive heart failure), NYHA class 4 (HCC) 08/06/2014   DYSPNEA 07/04/2010   Chest pain- MI r/o 05/24/2009   Obesity (BMI 30-39.9) 02/13/2009   Essential hypertension 02/13/2009   NICM- EF 25-30% echo 08/05/14 02/13/2009    Family History  Problem Relation Age of Onset   Cerebral aneurysm Mother 35       ruptured   Hypertension Mother        severe   Heart attack Father 35   Coronary artery disease Other    Hypertension Brother        severe   Colon cancer Neg Hx    Breast cancer Neg Hx     Social History   Tobacco Use   Smoking status: Never   Smokeless tobacco: Never   Tobacco comments:    Never smoke 03/07/23  Substance Use Topics   Alcohol use: Yes    Alcohol/week: 7.0 - 14.0 standard drinks of alcohol    Types: 7 - 14 Cans of beer per week    Comment: 1-2 beers daily 03/07/23    Allergies  Allergen Reactions   Chlorhexidine  Gluconate Hives    Unknown if this is the cause of the hives, raised areas around lab draw sites. No tape and area wiped with chlorhexidine .   Latex Itching    Current Meds  Medication Sig   Acetaminophen  (TYLENOL  PO) Take 1,000 mg by mouth daily as needed (for pain).   albuterol  (ACCUNEB ) 0.63 MG/3ML nebulizer solution Take 3 mLs (0.63 mg total) by nebulization every 6 (six) hours as needed for wheezing or shortness of breath.   albuterol  (VENTOLIN  HFA) 108 (90 Base) MCG/ACT inhaler Inhale 1 puff into the lungs every 6 (six) hours as needed.    apixaban  (ELIQUIS ) 5 MG TABS tablet TAKE 1 TABLET(5 MG) BY MOUTH TWICE DAILY   atorvastatin  (LIPITOR ) 80 MG tablet Take 1 tablet (80 mg total) by mouth daily.   carvedilol  (COREG ) 25 MG tablet TAKE 1 TABLET(25 MG) BY MOUTH TWICE DAILY WITH A MEAL   cetirizine (ZYRTEC) 10 MG tablet Take 10 mg by mouth daily.   cholecalciferol (VITAMIN D3) 25 MCG (1000 UNIT) tablet Take 1,000 Units by mouth daily.   empagliflozin  (JARDIANCE ) 10 MG TABS tablet Take 1 tablet (10 mg total) by mouth daily before breakfast.   ezetimibe  (ZETIA ) 10 MG tablet Take 1 tablet (10 mg total) by mouth daily.   fluticasone  (FLONASE ) 50 MCG/ACT nasal spray Place 2 sprays into both nostrils daily.   Fluticasone -Umeclidin-Vilant (TRELEGY ELLIPTA ) 100-62.5-25 MCG/ACT AEPB Inhale 1 puff into the lungs daily.   Fluticasone -Umeclidin-Vilant (TRELEGY ELLIPTA ) 100-62.5-25 MCG/ACT AEPB Inhale 1 puff into the lungs  daily in the afternoon.   furosemide  (LASIX ) 20 MG tablet Take 2 tablets (40 mg total) by mouth daily. Take in case of weight gain 2 to 3 lbs in 24 hrs or 5 lbs in 7 days until weight back to baseline   HYDROcodone -acetaminophen  (NORCO/VICODIN) 5-325 MG tablet Take 1 tablet by mouth daily as needed for severe pain (pain score 7-10).   isosorbide -hydrALAZINE  (BIDIL ) 20-37.5 MG tablet Take 1 tablet by mouth 3 (three) times daily.   Multiple Vitamin (MULTIVITAMIN) tablet Take 1 tablet by mouth daily.   zolpidem  (AMBIEN ) 5 MG tablet Take 5 mg by mouth at bedtime as needed for sleep.     There is no immunization history on file for this patient.      Objective:     BP (!) 160/90 (BP Location: Left Arm, Patient Position: Sitting, Cuff Size: Normal)   Pulse 70   Temp 98.7 F (37.1 C) (Oral)   Ht 5' 5 (1.651 m)   Wt 162 lb (73.5 kg)   SpO2 99%   BMI 26.96 kg/m   SpO2: 99 %  GENERAL: Overweight woman, no acute distress, fully ambulatory, no conversational dyspnea. HEAD: Normocephalic, atraumatic.  EYES: Pupils  equal, round, reactive to light.  No scleral icterus.  MOUTH: Oral mucosa moist.  No thrush. NECK: Supple. No thyromegaly. Trachea midline. No JVD.  No adenopathy. PULMONARY: Good air entry bilaterally.  No adventitious sounds. CARDIOVASCULAR: S1 and S2. Regular rate and rhythm.  No rubs, murmurs or gallops heard. ABDOMEN: Benign. MUSCULOSKELETAL: No joint deformity, no clubbing, no edema.  NEUROLOGIC: No overt focal deficit. SKIN: Intact,warm,dry. PSYCH: Mood and behavior normal.  Lab Results  Component Value Date   NITRICOXIDE 28 06/08/2024  *Low level of type II inflammation detected (caveat: Patient recently was treated with steroids)  2 view chest x-ray performed 25 April 2024 showing cardiomegaly with pulmonary vascular congestion, BiV pacer in place   Recent CBCs during July hospital admission showed eosinophilia.  Assessment & Plan:     ICD-10-CM   1. Persistent asthma with undetermined severity  J45.998 Allergen Panel (27) + IGE    Pulmonary function test    2. Shortness of breath  R06.02 Nitric oxide     Pulmonary function test    3. Nonischemic cardiomyopathy (HCC)  I42.8     4. Chronic systolic HF (heart failure) (HCC)  I50.22       Orders Placed This Encounter  Procedures   Allergen Panel (27) + IGE   Nitric oxide    Pulmonary function test    Standing Status:   Future    Expiration Date:   06/08/2025    Where should this test be performed?:   Outpatient Pulmonary    What type of PFT is being ordered?:   Full PFT    Meds ordered this encounter  Medications   Fluticasone -Umeclidin-Vilant (TRELEGY ELLIPTA ) 100-62.5-25 MCG/ACT AEPB    Sig: Inhale 1 puff into the lungs daily.    Dispense:  60 each    Refill:  11   Fluticasone -Umeclidin-Vilant (TRELEGY ELLIPTA ) 100-62.5-25 MCG/ACT AEPB    Sig: Inhale 1 puff into the lungs daily in the afternoon.    Lot Number?:   [id number]    Expiration Date?:   07/05/2025    NDC:   9826-9112-38 [372260]    Quantity:   1    Discussion:    Asthma Asthma with airway inflammation, likely eosinophilic, as indicated by elevated eosinophils. Symptoms include severe cough, shortness of breath, and mucus  production, which improve with prednisone . Likely adult-onset asthma given no childhood history. Albuterol  provides some relief but not complete resolution of symptoms. Prednisone  has been effective in reducing symptoms, suggesting inflammation-driven asthma. Multiple hospitalizations due to exacerbations. Current exacerbation likely due to cessation of prednisone  10 days ago, leading to increased inflammation. - Prescribe Trelegy Ellipta  100, one puff daily. - Instruct to rinse mouth with water and baking soda after using Trelegy to prevent oral thrush. - Continue using albuterol  as a rescue inhaler. - Switch to Airsupra as rescue inhaler for better control. - Order pulmonary function tests. - Order blood work at [facility] to check for allergies.    Nonischemic cardiomyopathy with chronic heart failure This issue adds complexity to her management.  Continue follow-up with cardiology   Advised if symptoms do not improve or worsen, to please contact office for sooner follow up or seek emergency care.    I spent 60 minutes of dedicated to the care of this patient on the date of this encounter to include pre-visit review of records, face-to-face time with the patient discussing conditions above, post visit ordering of testing, clinical documentation with the electronic health record, making appropriate referrals as documented, and communicating necessary findings to members of the patients care team.   C. Leita Sanders, MD Advanced Bronchoscopy PCCM [facility]    *This note was dictated using voice recognition software/Dragon.  Despite best efforts to proofread, errors can occur which can change the meaning. Any transcriptional errors that result from this process are unintentional and may not be  fully corrected at the time of dictation.

## 2024-06-08 NOTE — Patient Instructions (Signed)
 VISIT SUMMARY:  You came in today because of a persistent cough and shortness of breath. You have been experiencing these symptoms since late September or early October of last year, and they have led to multiple hospitalizations. You have a history of congestive heart failure (CHF), but you believe your current symptoms are not related to your heart condition. During your hospital visits, prednisone  helped alleviate your symptoms, but they return once you stop taking it. You also use albuterol  as a rescue inhaler, which provides partial relief.  YOUR PLAN:  -ASTHMA: Asthma is a condition where your airways become inflamed and narrow, making it hard to breathe. Your symptoms, including severe cough, shortness of breath, and mucus production, suggest that you have adult-onset asthma, likely driven by inflammation. We will start you on Trelegy Ellipta  100, which you should take one puff daily. Make sure to rinse your mouth with water and baking soda after using it to prevent oral thrush. Continue using your albuterol  inhaler as needed, and we may switch you to Airsupra if better control is needed. We will also conduct lung function tests and blood work to check for allergies.  INSTRUCTIONS:  Please follow up with the lung function tests and blood work at [facility] as ordered. Continue using your medications as prescribed and monitor your symptoms. If you experience any worsening of your symptoms or have any concerns, please contact our office.

## 2024-06-26 NOTE — Progress Notes (Deleted)
 Cardiology Office Note:    Date:  07/02/2024   ID:  [name], DOB 09-Mar-1961, MRN [id number]  PCP:  [name] Hamilton, MD   [name] Cardiologist:  [name] Schilling, MD Electrophysiologist:  [name] Birmingham, MD { Click to update primary MD,subspecialty MD or APP then REFRESH:1}    Referring MD: [name] Hamilton, MD   No chief complaint on file. ***  History of Present Illness:    [name] is a 63 y.o. female with a hx of ***  Past Medical History:  Diagnosis Date   CHF (congestive heart failure) (HCC)    Hypertension    Overweight(278.02)    Sciatica    Secondary cardiomyopathy, unspecified    EF now 50%    Past Surgical History:  Procedure Laterality Date   ABDOMINAL HYSTERECTOMY     BIV ICD INSERTION CRT-D N/A 01/03/2023   Procedure: BIV ICD INSERTION CRT-D;  Surgeon: [name] [name] ORN, MD;  Location: [facility] INVASIVE CV LAB;  Service: Cardiovascular;  Laterality: N/A;    Current Medications: No outpatient medications have been marked as taking for the 07/06/24 encounter (Appointment) with [name] [name] Garre, PA.     Allergies:   Chlorhexidine  gluconate and Latex   Social History   Socioeconomic History   Marital status: Single    Spouse name: Not on file   Number of children: 3   Years of education: Not on file   Highest education level: High school graduate  Occupational History   Not on file  Tobacco Use   Smoking status: Never   Smokeless tobacco: Never   Tobacco comments:    Never smoke 03/07/23  Vaping Use   Vaping status: Never Used  Substance and Sexual Activity   Alcohol use: Yes    Alcohol/week: 7.0 - 14.0 standard drinks of alcohol    Types: 7 - 14 Cans of beer per week    Comment: 1-2 beers daily 03/07/23   Drug use: No   Sexual activity: Yes    Birth control/protection: Surgical  Other Topics Concern   Not on file  Social History Narrative   Not on file   Social Drivers of Health   Financial Resource Strain: Low  Risk  (01/19/2024)   Overall Financial Resource Strain (CARDIA)    Difficulty of Paying Living Expenses: Not hard at all  Food Insecurity: No Food Insecurity (01/31/2024)   Hunger Vital Sign    Worried About Running Out of Food in the Last Year: Never true    Ran Out of Food in the Last Year: Never true  Transportation Needs: No Transportation Needs (01/31/2024)   PRAPARE - Administrator, Civil Service (Medical): No    Lack of Transportation (Non-Medical): No  Physical Activity: Not on file  Stress: Not on file  Social Connections: Not on file     Family History: The patient's ***family history includes Cerebral aneurysm (age of onset: 51) in her mother; Coronary artery disease in an other family member; Heart attack (age of onset: 38) in her father; Hypertension in her brother and mother. There is no history of Colon cancer or Breast cancer.  ROS:   Please see the history of present illness.    *** All other systems reviewed and are negative.  EKGs/Labs/Other Studies Reviewed:    The following studies were reviewed today: ***      Recent Labs: 04/25/2024: ALT 11; Magnesium  1.7 05/17/2024: B Natriuretic Peptide 496.0 05/18/2024: BUN 40; Creatinine, Ser  2.35; Hemoglobin 9.7; Platelets 210; Potassium 3.0; Sodium 143  Recent Lipid Panel    Component Value Date/Time   CHOL 181 03/22/2024 1024   TRIG 90 03/22/2024 1024   HDL 56 03/22/2024 1024   CHOLHDL 3.2 03/22/2024 1024   CHOLHDL 2.9 12/14/2021 0354   VLDL 11 12/14/2021 0354   LDLCALC 109 (H) 03/22/2024 1024     Risk Assessment/Calculations:   {Does this patient have ATRIAL FIBRILLATION?[phone]}  No BP recorded.  {Refresh Note OR Click here to enter BP  :1}***         Physical Exam:    VS:  There were no vitals taken for this visit.    Wt Readings from Last 3 Encounters:  06/08/24 162 lb (73.5 kg)  05/25/24 162 lb 3.2 oz (73.6 kg)  04/25/24 160 lb 15 oz (73 kg)     GEN: *** Well nourished, well  developed in no acute distress HEENT: Normal NECK: No JVD; No carotid bruits LYMPHATICS: No lymphadenopathy CARDIAC: ***RRR, no murmurs, rubs, gallops RESPIRATORY:  Clear to auscultation without rales, wheezing or rhonchi  ABDOMEN: Soft, non-tender, non-distended MUSCULOSKELETAL:  No edema; No deformity  SKIN: Warm and dry NEUROLOGIC:  Alert and oriented x 3 PSYCHIATRIC:  Normal affect   ASSESSMENT:    No diagnosis found. PLAN:    In order of problems listed above:  ***      {Are you ordering a CV Procedure (e.g. stress test, cath, DCCV, TEE, etc)?   Press F2        :[id number]}    Medication Adjustments/Labs and Tests Ordered: Current medicines are reviewed at length with the patient today.  Concerns regarding medicines are outlined above.  No orders of the defined types were placed in this encounter.  No orders of the defined types were placed in this encounter.   There are no Patient Instructions on file for this visit.   Signed, [name], [place]  07/02/2024 12:45 PM    [facility]

## 2024-07-03 NOTE — Progress Notes (Unsigned)
 Cardiology Office Note:    Date:  07/06/2024   ID:  [name], DOB 1960-11-28, MRN [id number]  PCP:  Larnell Hamilton, MD   [facility] Providers Cardiologist:  Lynwood Schilling, MD Electrophysiologist:  Danelle Birmingham, MD     Referring MD: Larnell Hamilton, MD   Chief complaint: 1 month follow-up  History of Present Illness:    [name] is a 63 y.o. female with a hx of HTN, NICM, CVA, atrial fib, CKD stage IV, hyperlipidemia, Medtronic BIV ICD (CRT-D), presents to office today for follow up of her chronic combined congestive HF.  Echo 08/2014: LVEF 25-30%  Echo 11/2014: LVEF 40-45% and grade 1 DD Echo 12/2016: LVEF 50-55% and grade 1 DD Echo 06/2018: LVEF 50-55%  Following CVA in February 2023, Cardiac MRI without contrast 12/17/2021: Moderate LV dilation with diffuse severe LV hypokinesis and prominent septal-lateral dyssynchrony consistent with LBBB.  EF 20%, no LV thrombus/no source of clot, no evidence for large prior MI, myocarditis, or infiltrative disease. Stopped entresto  and Farxiga due to worsening kidney function, started on hydralazine  and nitrites. Negative for sleep apnea.  Echocardiogram on 11/14/2022 indicated LVEF of 20 to 25%, LV demonstrating global hypokinesis, LV internal cavity size was severely dilated, G2 DD. Given persistent reduction in EF and LBBB, she was referred to Dr. Birmingham. 01/03/2023 had Medtronic BIV ICD (CRT-D) inserted. Shortly after insertion, she was alerted to episodes of atrial fibrillation, was started on 5 mg Eliquis  BID and referred to A. Fib clinic.  Admitted to the hospital from 3/14-3/18/2025 for acute on chronic combined congestive HF. Echo 01/17/2024: LVEF 20-25%, severely decreased LV function, moderate asymmetric LV hypertrophy of inferior segment, G1 DD, normal RV, aortic valve not well-visualized.  Cardiology was consulted for concern for prolonged episodes of NSVT on telemetry, on review felt longest episode to be AT rather than  NSVT that was supported by ICD interrogation.   The day after her TOC appointment 2 weeks following D/C from hospital had sudden onset SOB, O2 sat 83% on RA when EMS arrived. Readmitted to hospital from 01/31/2024-02/06/2024 for stabilization and diuresis.   04/25/2024 arrived to the ED for cough and SOB. CXR showed cardiomegaly with central pulmonary vascular congestion. K was 3.2. BNP was elevated 436. Patient was offered admission for diuresis but declined. Treated with DuoNeb, IV Lasix  20 mg and KCL 40 MEQ.   05/17/2024 arrived to the ED due to worsening cough and shortness of breath, Was requesting IV lasix  and nebulizer treatment. Chest X-ray showed no acute process. EKG at that time sinus rhythm with occasional PVC's, LBBB. BNP 496. Troponins were 32>> 38. Admitted overnight, strict I/O's, with IV diuresis And breathing treatments. Discharged next day, follow up with cardiology and pulmonology.  05/25/2024: Cardiology OV found to be euvolemic on exam, BP log ordered, meds continued, zetia  ordered, referred to nephrology for CKD IV.   06/08/2024: Pulmonology OV dx with asthma, prescribed trelegy ellipta , albuterol , and airsupra rescue inhaler, ordered PFT's, scheduled for 08/2024.  Presents to the office today feeling stable from a cardiac standpoint. Denies CP, SOB, orthopnea, palpitations, nausea, vomiting, dark/tarry/bloody stools, change/worsening to baseline leg swelling. Reports her DOE is at baseline, exacerbated when she tries to vacuum her entire apartment but resolves when she rests for 15 minutes. Can walk the entire grocery store when shopping without getting SOB. States she has always slept with 2 pillows for comfort, but does lay flat regularly for around 15 minutes each time to check her breathing,  has not had any difficulties with orthopnea recently. Does report a chronic cough with nasal congestion over the last year, sees ENT for this next week. Has an appointment with nephrology next  week that is scheduled. States that her SOB has improved after starting Trelegy with pulmonology. Checks her weights and BP daily, has not gained weight, reports she left her BP log at home. Highest she remembers: 152/94. Lowest: 127/78. Denies any symptoms of lightheadedness, dizziness, headaches, blurred vision, or changes to these with positional changes. 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   Past Medical History:  Diagnosis Date   CHF (congestive heart failure) (HCC)    Hypertension    Overweight(278.02)    Sciatica    Secondary cardiomyopathy, unspecified    EF now 50%    Past Surgical History:  Procedure Laterality Date   ABDOMINAL HYSTERECTOMY     BIV ICD INSERTION CRT-D N/A 01/03/2023   Procedure: BIV ICD INSERTION CRT-D;  Surgeon: Waddell Danelle ORN, MD;  Location: [facility] INVASIVE CV LAB;  Service: Cardiovascular;  Laterality: N/A;    Current Medications: Current Meds  Medication Sig   Acetaminophen  (TYLENOL  PO) Take 1,000 mg by mouth daily as needed (for pain).   albuterol  (ACCUNEB ) 0.63 MG/3ML nebulizer solution Take 3 mLs (0.63 mg total) by nebulization every 6 (six) hours as needed for wheezing or shortness of breath.   albuterol  (VENTOLIN  HFA) 108 (90 Base) MCG/ACT inhaler Inhale 1 puff into the lungs every 6 (six) hours as needed.   apixaban  (ELIQUIS ) 5 MG TABS tablet TAKE 1 TABLET(5 MG) BY MOUTH TWICE DAILY   atorvastatin  (LIPITOR ) 80 MG tablet Take 1 tablet (80 mg total) by mouth daily.   carvedilol  (COREG ) 25 MG tablet TAKE 1 TABLET(25 MG) BY MOUTH TWICE DAILY WITH A MEAL   cetirizine (ZYRTEC) 10 MG tablet Take 10 mg by mouth daily.   cholecalciferol (VITAMIN D3) 25 MCG (1000 UNIT) tablet Take 1,000 Units by mouth daily.   ezetimibe  (ZETIA ) 10 MG tablet Take 1 tablet (10 mg total) by mouth daily.   fluticasone  (FLONASE ) 50 MCG/ACT nasal spray Place 2 sprays into both nostrils daily.    Fluticasone -Umeclidin-Vilant (TRELEGY ELLIPTA ) 100-62.5-25 MCG/ACT AEPB Inhale 1 puff into the lungs daily.   furosemide  (LASIX ) 20 MG tablet Take 2 tablets (40 mg total) by mouth daily. Take in case of weight gain 2 to 3 lbs in 24 hrs or 5 lbs in 7 days until weight back to baseline   HYDROcodone -acetaminophen  (NORCO/VICODIN) 5-325 MG tablet Take 1 tablet by mouth daily as needed for severe pain (pain score 7-10).   isosorbide -hydrALAZINE  (BIDIL ) 20-37.5 MG tablet Take 2 tablets three times daily. Please stop if blood pressure drops too low   Multiple Vitamin (MULTIVITAMIN) tablet Take 1 tablet by mouth daily.   zolpidem  (AMBIEN ) 5 MG tablet Take 5 mg by mouth at bedtime as needed for sleep.   [DISCONTINUED] empagliflozin  (JARDIANCE ) 10 MG TABS tablet Take 1 tablet (10 mg total) by mouth daily before breakfast.   [DISCONTINUED] Fluticasone -Umeclidin-Vilant (TRELEGY ELLIPTA ) 100-62.5-25 MCG/ACT AEPB Inhale 1 puff into the lungs daily in the afternoon.   [DISCONTINUED] isosorbide -hydrALAZINE  (BIDIL ) 20-37.5 MG tablet Take 1 tablet by mouth 3 (three) times daily.     Allergies:   Chlorhexidine  gluconate and Latex   Social History   Socioeconomic History   Marital status: Single    Spouse name: Not on file  Number of children: 3   Years of education: Not on file   Highest education level: High school graduate  Occupational History   Not on file  Tobacco Use   Smoking status: Never   Smokeless tobacco: Never   Tobacco comments:    Never smoke 03/07/23  Vaping Use   Vaping status: Never Used  Substance and Sexual Activity   Alcohol use: Yes    Alcohol/week: 7.0 - 14.0 standard drinks of alcohol    Types: 7 - 14 Cans of beer per week    Comment: 1-2 beers daily 03/07/23   Drug use: No   Sexual activity: Yes    Birth control/protection: Surgical  Other Topics Concern   Not on file  Social History Narrative   Not on file   Social Drivers of Health   Financial Resource Strain:  Low Risk  (01/19/2024)   Overall Financial Resource Strain (CARDIA)    Difficulty of Paying Living Expenses: Not hard at all  Food Insecurity: No Food Insecurity (01/31/2024)   Hunger Vital Sign    Worried About Running Out of Food in the Last Year: Never true    Ran Out of Food in the Last Year: Never true  Transportation Needs: No Transportation Needs (01/31/2024)   PRAPARE - Administrator, Civil Service (Medical): No    Lack of Transportation (Non-Medical): No  Physical Activity: Not on file  Stress: Not on file  Social Connections: Not on file     Family History: The patient's family history includes Cerebral aneurysm (age of onset: 104) in her mother; Coronary artery disease in an other family member; Heart attack (age of onset: 59) in her father; Hypertension in her brother and mother. There is no history of Colon cancer or Breast cancer.  ROS:   Please see the history of present illness.    All other systems reviewed and are negative.  EKGs/Labs/Other Studies Reviewed:    The following studies were reviewed today:       Recent Labs: 04/25/2024: ALT 11; Magnesium  1.7 05/17/2024: B Natriuretic Peptide 496.0 05/18/2024: BUN 40; Creatinine, Ser 2.35; Hemoglobin 9.7; Platelets 210; Potassium 3.0; Sodium 143  Recent Lipid Panel    Component Value Date/Time   CHOL 181 03/22/2024 1024   TRIG 90 03/22/2024 1024   HDL 56 03/22/2024 1024   CHOLHDL 3.2 03/22/2024 1024   CHOLHDL 2.9 12/14/2021 0354   VLDL 11 12/14/2021 0354   LDLCALC 109 (H) 03/22/2024 1024     Risk Assessment/Calculations:    CHA2DS2-VASc Score = 3   This indicates a 3.2% annual risk of stroke. The patient's score is based upon: CHF History: 1 HTN History: 1 Diabetes History: 0 Stroke History: 0 Vascular Disease History: 0 Age Score: 0 Gender Score: 1     HYPERTENSION CONTROL Vitals:   07/06/24 1439 07/06/24 1450  BP: (!) 152/90 (!) 148/84    The patient's blood pressure is elevated  above target today.  In order to address the patient's elevated BP: A current anti-hypertensive medication was adjusted today.       Physical Exam:    VS:  BP (!) 148/84 (BP Location: Left Arm, Cuff Size: Normal)   Pulse 78   Ht 5' 5 (1.651 m)   Wt 159 lb 6.4 oz (72.3 kg)   SpO2 96%   BMI 26.53 kg/m     Wt Readings from Last 3 Encounters:  07/06/24 159 lb 6.4 oz (72.3 kg)  06/08/24 162 lb (  73.5 kg)  05/25/24 162 lb 3.2 oz (73.6 kg)     GEN:  Well nourished, well developed in no acute distress HEENT: Normal NECK: No carotid bruits CARDIAC: S1-S2 normal, RRR, no murmurs, rubs, gallops RESPIRATORY:  Clear to auscultation without rales, wheezing or rhonchi  MUSCULOSKELETAL: + 1 LE edema, unchanged from baseline per patient; No deformity, 2+ DP pulses bilaterally SKIN: Warm and dry NEUROLOGIC:  Alert and oriented x 3 PSYCHIATRIC:  Normal affect   ASSESSMENT:    1. Chronic combined systolic and diastolic congestive heart failure (HCC)   2. Biventricular ICD (implantable cardioverter-defibrillator) in place   3. Non-ischemic cardiomyopathy (HCC)   4. PAF (paroxysmal atrial fibrillation) (HCC)   5. Essential hypertension   6. Hypokalemia   7. Chronic kidney disease (CKD), stage IV (severe) (HCC)   8. Hyperlipidemia LDL goal <70    PLAN:    In order of problems listed above:  NICM, combined congestive heart failure, NYHA class 2 -Historical LVEFs of 20-25% since 2023 -Echo 01/17/2024: LVEF 20-25%, severely decreased LV function, moderate asymmetric LV hypertrophy of inferior segment, G1 DD, normal RV, aortic valve not well-visualized. -cMRI 12/2021 without evidence of prior MI or amyloid  -Had BIV ICD (CRT-D) placed with Dr. Waddell on 01/03/2023 -Reviewed ICD interrogation data from 04/2024 which shows increased impedence from the month of May, which correlates with her increase in ED visits over the following months. Unfortunately do not have access to more up-to-date data at  this time.  -Denies CP, SOB, palpitations, orthopnea, leg swelling - Continue GDMT:        - Coreg  25 mg BID        - Jardiance  10 mg daily, will recheck kidney function today considering it elevated  while on Farxiga       - Lasix  40 mg daily       - Increase Bidil  from 20-37.5 TID to 2 tablets TID -Not on ACE/ARB/Entresto  with renal function. - Follow up with nephrology next week, will suggest they manage your diuretics moving forward - Follow up with advanced heart failure clinic considering increased frequency of ED visits this year, increased impedence on ICD interrogation, approaching the limits of GDMT given her CKD stage IV, would appreciate help with titrating her HTN meds while balancing her kidney function, trying to keep her euvolemic and out of the ED moving forward.  PAF: - Last EKG 05/17/2024 showed no evidence of A. Fib - Last A. Fib episode observed on remote device check was 01/20/2023 - Denies palpitations, lightheadedness, near syncope -Continue Eliquis  5 mg BID as this is the correct dose for her age, weight, and creatinine - CHA2DS2VASc score = 3 -Continue Coreg  25 mg BID  Essential HTN: -BP: 148/84  - Increase Bidil  from 20-37.5 TID to 40-75 mg TID  HLD, LDL goal <70 -04/2024: LDL 85 -Continue atorvastatin  80 mg daily -Zetia  10 mg added on 05/25/2024 - Refer to advanced lipid clinic  Hypokalemia -05/18/2024: K 3.0  - States she was taking potassium since her previous visit, instructed to stop that at this time. -BMP ordered to recheck at previous visit, was not performed -Will reorder BMP   Chronic kidney disease, stage IV  -Was referred to nephrology at previous visit with a Cr of 3  - Has appointment to be seen by them next week.  Follow up with advanced heart failure clinic, advanced lipid clinic, and nephrology. Follow up with general cardiology in 4 months.  Medication Adjustments/Labs and Tests Ordered: Current medicines are reviewed at  length with the patient today.  Concerns regarding medicines are outlined above.  Orders Placed This Encounter  Procedures   Basic metabolic panel with GFR   LDL cholesterol, direct   AMB referral to [facility]   AMB Referral to Advanced Lipid Disorders Clinic   Meds ordered this encounter  Medications   isosorbide -hydrALAZINE  (BIDIL ) 20-37.5 MG tablet    Sig: Take 2 tablets three times daily. Please stop if blood pressure drops too low    Dispense:  180 tablet    Refill:  11   empagliflozin  (JARDIANCE ) 10 MG TABS tablet    Sig: Take 1 tablet (10 mg total) by mouth daily before breakfast.    Dispense:  90 tablet    Refill:  3    Patient Instructions  Medication Instructions:  Increase Bidil  take 2 tablets three times daily. If your systolic BP (top number) is consistently 100 or less, reduce down to 1 tablet three times daily.  Stop Potassium as directed  *If you need a refill on your cardiac medications before your next appointment, please call your pharmacy*  Lab Work: BMET & LDL Direct today  Testing/Procedures: NONE ordered at this time of appointment   Follow-Up: At [facility], you and your health needs are our priority.  As part of our continuing mission to provide you with exceptional heart care, our providers are all part of one team.  This team includes your primary Cardiologist (physician) and Advanced Practice Providers or APPs (Physician Assistants and Nurse Practitioners) who all work together to provide you with the care you need, when you need it.  Your next appointment:   3-4 month(s)  Provider:   Scot Men PA & Eleonore Shams NP) on December 1st or 5th  We recommend signing up for the patient portal called MyChart.  Sign up information is provided on this After Visit Summary.  MyChart is used to connect with patients for Virtual Visits (Telemedicine).  Patients are able to view lab/test results, encounter notes, upcoming appointments,  etc.  Non-urgent messages can be sent to your provider as well.   To learn more about what you can do with MyChart, go to [email or web address].          Signed, Miriam FORBES Shams, NP  07/06/2024 5:15 PM    [facility]

## 2024-07-06 ENCOUNTER — Ambulatory Visit (INDEPENDENT_AMBULATORY_CARE_PROVIDER_SITE_OTHER): Payer: BC Managed Care – PPO

## 2024-07-06 ENCOUNTER — Encounter: Payer: Self-pay | Admitting: Physician Assistant

## 2024-07-06 ENCOUNTER — Ambulatory Visit: Attending: Physician Assistant | Admitting: Emergency Medicine

## 2024-07-06 VITALS — BP 148/84 | HR 78 | Ht 65.0 in | Wt 159.4 lb

## 2024-07-06 DIAGNOSIS — Z95810 Presence of automatic (implantable) cardiac defibrillator: Secondary | ICD-10-CM

## 2024-07-06 DIAGNOSIS — E876 Hypokalemia: Secondary | ICD-10-CM

## 2024-07-06 DIAGNOSIS — I428 Other cardiomyopathies: Secondary | ICD-10-CM

## 2024-07-06 DIAGNOSIS — I5042 Chronic combined systolic (congestive) and diastolic (congestive) heart failure: Secondary | ICD-10-CM | POA: Diagnosis not present

## 2024-07-06 DIAGNOSIS — I10 Essential (primary) hypertension: Secondary | ICD-10-CM

## 2024-07-06 DIAGNOSIS — N184 Chronic kidney disease, stage 4 (severe): Secondary | ICD-10-CM | POA: Diagnosis not present

## 2024-07-06 DIAGNOSIS — I480 Paroxysmal atrial fibrillation: Secondary | ICD-10-CM | POA: Diagnosis not present

## 2024-07-06 DIAGNOSIS — E785 Hyperlipidemia, unspecified: Secondary | ICD-10-CM

## 2024-07-06 MED ORDER — EMPAGLIFLOZIN 10 MG PO TABS: 10.0000 mg | ORAL_TABLET | Freq: Every day | ORAL | 3 refills | Status: AC

## 2024-07-06 MED ORDER — ISOSORB DINITRATE-HYDRALAZINE 20-37.5 MG PO TABS: ORAL_TABLET | ORAL | 11 refills | Status: DC

## 2024-07-06 NOTE — Patient Instructions (Addendum)
 Medication Instructions:  Increase Bidil  take 2 tablets three times daily. If your systolic BP (top number) is consistently 100 or less, reduce down to 1 tablet three times daily.  Stop Potassium as directed  *If you need a refill on your cardiac medications before your next appointment, please call your pharmacy*  Lab Work: BMET & LDL Direct today  Testing/Procedures: NONE ordered at this time of appointment   Follow-Up: At [facility], you and your health needs are our priority.  As part of our continuing mission to provide you with exceptional heart care, our providers are all part of one team.  This team includes your primary Cardiologist (physician) and Advanced Practice Providers or APPs (Physician Assistants and Nurse Practitioners) who all work together to provide you with the care you need, when you need it.  Your next appointment:   3-4 month(s)  Provider:   Scot Men PA & Eleonore Shams NP) on December 1st or 5th  We recommend signing up for the patient portal called MyChart.  Sign up information is provided on this After Visit Summary.  MyChart is used to connect with patients for Virtual Visits (Telemedicine).  Patients are able to view lab/test results, encounter notes, upcoming appointments, etc.  Non-urgent messages can be sent to your provider as well.   To learn more about what you can do with MyChart, go to [email or web address].

## 2024-07-07 ENCOUNTER — Ambulatory Visit: Payer: Self-pay | Admitting: Emergency Medicine

## 2024-07-07 LAB — BASIC METABOLIC PANEL WITH GFR
BUN/Creatinine Ratio: 18 (ref 12–28)
BUN: 38 mg/dL — ABNORMAL HIGH (ref 8–27)
CO2: 20 mmol/L (ref 20–29)
Calcium: 9.2 mg/dL (ref 8.7–10.3)
Chloride: 106 mmol/L (ref 96–106)
Creatinine, Ser: 2.12 mg/dL — ABNORMAL HIGH (ref 0.57–1.00)
Glucose: 83 mg/dL (ref 70–99)
Potassium: 3.5 mmol/L (ref 3.5–5.2)
Sodium: 143 mmol/L (ref 134–144)
eGFR: 26 mL/min/1.73 — ABNORMAL LOW (ref >59)

## 2024-07-07 LAB — LDL CHOLESTEROL, DIRECT: LDL Direct: 77 mg/dL (ref 0–99)

## 2024-07-08 DIAGNOSIS — R0981 Nasal congestion: Secondary | ICD-10-CM | POA: Diagnosis not present

## 2024-07-08 LAB — CUP PACEART REMOTE DEVICE CHECK
Battery Remaining Longevity: 78 mo
Battery Voltage: 2.98 V
Brady Statistic RV Percent Paced: 0.88 %
Date Time Interrogation Session: 20250901213854
HighPow Impedance: 51 Ohm
Implantable Lead Connection Status: 753985
Implantable Lead Connection Status: 753985
Implantable Lead Connection Status: 753985
Implantable Lead Implant Date: 20240301
Implantable Lead Implant Date: 20240301
Implantable Lead Implant Date: 20240301
Implantable Lead Location: 753858
Implantable Lead Location: 753859
Implantable Lead Location: 753860
Implantable Lead Model: 4598
Implantable Lead Model: 5076
Implantable Lead Model: 6935
Implantable Pulse Generator Implant Date: 20240301
Lead Channel Impedance Value: 285 Ohm
Lead Channel Impedance Value: 342 Ohm
Lead Channel Impedance Value: 342 Ohm
Lead Channel Impedance Value: 361 Ohm
Lead Channel Impedance Value: 361 Ohm
Lead Channel Impedance Value: 380 Ohm
Lead Channel Impedance Value: 437 Ohm
Lead Channel Impedance Value: 494 Ohm
Lead Channel Impedance Value: 551 Ohm
Lead Channel Impedance Value: 589 Ohm
Lead Channel Impedance Value: 589 Ohm
Lead Channel Impedance Value: 627 Ohm
Lead Channel Impedance Value: 627 Ohm
Lead Channel Pacing Threshold Amplitude: 0.875 V
Lead Channel Pacing Threshold Amplitude: 1.25 V
Lead Channel Pacing Threshold Amplitude: 1.625 V
Lead Channel Pacing Threshold Pulse Width: 0.4 ms
Lead Channel Pacing Threshold Pulse Width: 0.4 ms
Lead Channel Pacing Threshold Pulse Width: 0.4 ms
Lead Channel Sensing Intrinsic Amplitude: 21 mV
Lead Channel Sensing Intrinsic Amplitude: 3.8 mV
Lead Channel Setting Pacing Amplitude: 1.5 V
Lead Channel Setting Pacing Amplitude: 2 V
Lead Channel Setting Pacing Amplitude: 2.75 V
Lead Channel Setting Pacing Pulse Width: 0.4 ms
Lead Channel Setting Pacing Pulse Width: 0.4 ms
Lead Channel Setting Sensing Sensitivity: 0.3 mV
Zone Setting Status: 755011
Zone Setting Status: 755011

## 2024-07-11 ENCOUNTER — Ambulatory Visit: Payer: Self-pay | Admitting: Internal Medicine

## 2024-07-13 NOTE — Progress Notes (Signed)
Remote ICD Transmission.

## 2024-07-14 DIAGNOSIS — N184 Chronic kidney disease, stage 4 (severe): Secondary | ICD-10-CM | POA: Diagnosis not present

## 2024-07-14 DIAGNOSIS — I129 Hypertensive chronic kidney disease with stage 1 through stage 4 chronic kidney disease, or unspecified chronic kidney disease: Secondary | ICD-10-CM | POA: Diagnosis not present

## 2024-07-14 DIAGNOSIS — N2581 Secondary hyperparathyroidism of renal origin: Secondary | ICD-10-CM | POA: Diagnosis not present

## 2024-07-14 DIAGNOSIS — N390 Urinary tract infection, site not specified: Secondary | ICD-10-CM | POA: Diagnosis not present

## 2024-07-14 DIAGNOSIS — N179 Acute kidney failure, unspecified: Secondary | ICD-10-CM | POA: Diagnosis not present

## 2024-07-14 DIAGNOSIS — N189 Chronic kidney disease, unspecified: Secondary | ICD-10-CM | POA: Diagnosis not present

## 2024-07-14 DIAGNOSIS — D631 Anemia in chronic kidney disease: Secondary | ICD-10-CM | POA: Diagnosis not present

## 2024-07-16 DIAGNOSIS — J329 Chronic sinusitis, unspecified: Secondary | ICD-10-CM | POA: Diagnosis not present

## 2024-08-17 DIAGNOSIS — J324 Chronic pansinusitis: Secondary | ICD-10-CM | POA: Diagnosis not present

## 2024-08-18 ENCOUNTER — Telehealth (HOSPITAL_COMMUNITY): Payer: Self-pay | Admitting: Internal Medicine

## 2024-08-18 NOTE — Telephone Encounter (Signed)
 Called to confirm/remind patient of their appointment at the Advanced Heart Failure Clinic on 08/18/2024.   Appointment:   [] Confirmed  [x] Left mess   [] No answer/No voice mail  [] VM Full/unable to leave message  [] Phone not in service  Patient reminded to bring all medications and/or complete list.  Confirmed patient has transportation. Gave directions, instructed to utilize valet parking.

## 2024-08-19 ENCOUNTER — Encounter (HOSPITAL_COMMUNITY): Payer: Self-pay | Admitting: Internal Medicine

## 2024-08-19 ENCOUNTER — Ambulatory Visit (HOSPITAL_COMMUNITY)
Admission: RE | Admit: 2024-08-19 | Discharge: 2024-08-19 | Disposition: A | Discharge status: Home / Self Care | Source: Ambulatory Visit | Attending: Internal Medicine | Admitting: Internal Medicine

## 2024-08-19 VITALS — BP 138/90 | HR 78 | Ht 65.0 in | Wt 161.8 lb

## 2024-08-19 DIAGNOSIS — E785 Hyperlipidemia, unspecified: Secondary | ICD-10-CM | POA: Insufficient documentation

## 2024-08-19 DIAGNOSIS — I130 Hypertensive heart and chronic kidney disease with heart failure and stage 1 through stage 4 chronic kidney disease, or unspecified chronic kidney disease: Secondary | ICD-10-CM | POA: Insufficient documentation

## 2024-08-19 DIAGNOSIS — Z95810 Presence of automatic (implantable) cardiac defibrillator: Secondary | ICD-10-CM

## 2024-08-19 DIAGNOSIS — Z7984 Long term (current) use of oral hypoglycemic drugs: Secondary | ICD-10-CM | POA: Insufficient documentation

## 2024-08-19 DIAGNOSIS — I428 Other cardiomyopathies: Secondary | ICD-10-CM | POA: Diagnosis not present

## 2024-08-19 DIAGNOSIS — I447 Left bundle-branch block, unspecified: Secondary | ICD-10-CM | POA: Diagnosis not present

## 2024-08-19 DIAGNOSIS — R7989 Other specified abnormal findings of blood chemistry: Secondary | ICD-10-CM | POA: Insufficient documentation

## 2024-08-19 DIAGNOSIS — I5022 Chronic systolic (congestive) heart failure: Secondary | ICD-10-CM

## 2024-08-19 DIAGNOSIS — Z8673 Personal history of transient ischemic attack (TIA), and cerebral infarction without residual deficits: Secondary | ICD-10-CM | POA: Insufficient documentation

## 2024-08-19 DIAGNOSIS — J45909 Unspecified asthma, uncomplicated: Secondary | ICD-10-CM | POA: Diagnosis not present

## 2024-08-19 DIAGNOSIS — Z79899 Other long term (current) drug therapy: Secondary | ICD-10-CM | POA: Diagnosis not present

## 2024-08-19 DIAGNOSIS — N184 Chronic kidney disease, stage 4 (severe): Secondary | ICD-10-CM

## 2024-08-19 DIAGNOSIS — I10 Essential (primary) hypertension: Secondary | ICD-10-CM | POA: Diagnosis not present

## 2024-08-19 DIAGNOSIS — Z7901 Long term (current) use of anticoagulants: Secondary | ICD-10-CM | POA: Insufficient documentation

## 2024-08-19 DIAGNOSIS — R0981 Nasal congestion: Secondary | ICD-10-CM | POA: Insufficient documentation

## 2024-08-19 DIAGNOSIS — I480 Paroxysmal atrial fibrillation: Secondary | ICD-10-CM | POA: Insufficient documentation

## 2024-08-19 DIAGNOSIS — Z91040 Latex allergy status: Secondary | ICD-10-CM | POA: Insufficient documentation

## 2024-08-19 MED ORDER — SPIRONOLACTONE 25 MG PO TABS: 12.5000 mg | ORAL_TABLET | Freq: Every day | ORAL | 3 refills | Status: AC

## 2024-08-19 NOTE — Patient Instructions (Signed)
 Medication Changes:  START SPIRONOLACTONE  12.5MG  ONCE DAILY   Lab Work:  LABS IN 1 WEEK AS SCHEDULED   Follow-Up in: 1 MONTH AS SCHEDULED WITH APP CLINIC  At the [facility], you and your health needs are our priority. We have a designated team specialized in the treatment of Heart Failure. This Care Team includes your primary Heart Failure Specialized Cardiologist (physician), Advanced Practice Providers (APPs- Physician Assistants and Nurse Practitioners), and Pharmacist who all work together to provide you with the care you need, when you need it.   You may see any of the following providers on your designated Care Team at your next follow up:  Dr. Toribio Fuel Dr. Ezra Shuck Dr. Ria Commander Dr. Odis Brownie Greig Mosses, NP Caffie Shed, [place] [facility] [place], [place] Beckey Coe, NP [place] Lee, NP Tinnie Redman, PharmD   Please be sure to bring in all your medications bottles to every appointment.   Need to Contact Us :  If you have any questions or concerns before your next appointment please send us  a message through [place] or call our office at [phone].    TO LEAVE A MESSAGE FOR THE NURSE SELECT OPTION 2, PLEASE LEAVE A MESSAGE INCLUDING: YOUR NAME DATE OF BIRTH CALL BACK NUMBER REASON FOR CALL**this is important as we prioritize the call backs  YOU WILL RECEIVE A CALL BACK THE SAME DAY AS LONG AS YOU CALL BEFORE 4:00 PM

## 2024-08-19 NOTE — Progress Notes (Signed)
 ADVANCED HF CLINIC CONSULT NOTE  Referring Physician: Larnell Hamilton, MD Primary Care: Larnell Hamilton, MD Primary Cardiologist: Lynwood Schilling, MD  Chief Complaint: HF  HPI:  [name] is a 63 y.o. 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 Stopped entresto  and Farxiga due to worsening kidney function, started on hydralazine  and nitrites. Negative for sleep apnea.   Echo 11/14/2022 LVEF 20-25%, G2 DD. Given persistent reduction in EF and LBBB 01/03/2023 had Medtronic BIV ICD (CRT-D) inserted.    Admitted to the hospital from 3/14-3/18/2025 for acute on chronic combined congestive HF. Echo 01/17/2024: LVEF 20-25%   Readmitted to hospital from 01/31/2024-02/06/2024 for securrent volume overload   04/25/2024 arrived to the ED for cough and SOB. CXR showed cardiomegaly with central pulmonary vascular congestion. K was 3.2. BNP was elevated 436. Patient was offered admission for diuresis but declined. Treated with DuoNeb, IV Lasix  20 mg and KCL 40 MEQ.    05/17/2024 arrived to the ED due to worsening cough and shortness of breath, Was requesting IV lasix  and nebulizer treatment. Chest X-ray showed no acute process. EKG at that time sinus rhythm with occasional PVC's, LBBB. BNP 496. Troponins were 32>> 38. Admitted overnight, strict I/O's, with IV diuresis And breathing treatments.   06/08/2024: Pulmonology OV dx with asthma, prescribed trelegy ellipta , albuterol , and airsupra rescue inhaler, ordered PFT's, scheduled for 08/2024.   Says she is doing well. Working FT at assisted living center. Only complaint is chronic nasal congestion. No CP,  orthopnea or PND. Says she is compliant with meds. Takes lasix  40mg  daily. Weights every am and will take extra if weight up 3 pounds. Lives with 28 y/o nephew. Can go up 13 steps without problem to her apt   ICD: Optivol up. No VT/AF. 95% bivPacing AL 4.7hr/day Personally reviewed    Past Medical History:  Diagnosis Date   CHF (congestive heart failure) (HCC)    Hypertension    Overweight(278.02)    Sciatica    Secondary cardiomyopathy, unspecified    EF now 50%    Current Outpatient Medications  Medication Sig Dispense Refill   Acetaminophen  (TYLENOL  PO) Take 1,000 mg by mouth daily as needed (for pain).     albuterol  (ACCUNEB ) 0.63 MG/3ML nebulizer solution Take 3 mLs (0.63 mg total) by nebulization every 6 (six) hours as needed for wheezing or shortness of breath. 75 mL 0   albuterol  (VENTOLIN  HFA) 108 (90 Base) MCG/ACT inhaler Inhale 1 puff into the lungs every 6 (six) hours as needed.     apixaban  (ELIQUIS ) 5 MG TABS tablet TAKE 1 TABLET(5 MG) BY MOUTH TWICE DAILY 60 tablet 6   atorvastatin  (LIPITOR ) 80 MG tablet Take 1 tablet (80 mg total) by mouth daily. 30 tablet 1   carvedilol  (COREG ) 25 MG tablet TAKE 1 TABLET(25 MG) BY MOUTH TWICE DAILY WITH A MEAL 180 tablet 3   cetirizine (ZYRTEC) 10 MG tablet Take 10 mg by mouth daily.     cholecalciferol (VITAMIN D3) 25 MCG (1000 UNIT) tablet Take 1,000 Units by mouth daily.     empagliflozin  (JARDIANCE ) 10 MG  TABS tablet Take 1 tablet (10 mg total) by mouth daily before breakfast. 90 tablet 3   ezetimibe  (ZETIA ) 10 MG tablet Take 1 tablet (10 mg total) by mouth daily. 90 tablet 1   fluticasone  (FLONASE ) 50 MCG/ACT nasal spray Place 2 sprays into both nostrils daily.     Fluticasone -Umeclidin-Vilant (TRELEGY ELLIPTA ) 100-62.5-25 MCG/ACT AEPB Inhale 1 puff into the lungs daily. 60 each 11   furosemide  (LASIX ) 20 MG tablet Take 2 tablets (40 mg total) by mouth daily. Take in case of weight gain 2 to 3 lbs in 24 hrs or 5 lbs in 7 days until  weight back to baseline 30 tablet 1   HYDROcodone -acetaminophen  (NORCO/VICODIN) 5-325 MG tablet Take 1 tablet by mouth daily as needed for severe pain (pain score 7-10).     isosorbide -hydrALAZINE  (BIDIL ) 20-37.5 MG tablet Take 2 tablets three times daily. Please stop if blood pressure drops too low (Patient taking differently: Take 2 tablets in the morning.Take 1 tablet at noon and 1 tablet at night.  Please stop if blood pressure drops too low) 180 tablet 11   Multiple Vitamin (MULTIVITAMIN) tablet Take 1 tablet by mouth daily.     zolpidem  (AMBIEN ) 5 MG tablet Take 5 mg by mouth at bedtime as needed for sleep.  1   No current facility-administered medications for this encounter.    Allergies  Allergen Reactions   Chlorhexidine  Gluconate Hives    Unknown if this is the cause of the hives, raised areas around lab draw sites. No tape and area wiped with chlorhexidine .   Latex Itching      Social History   Socioeconomic History   Marital status: Single    Spouse name: Not on file   Number of children: 3   Years of education: Not on file   Highest education level: High school graduate  Occupational History   Not on file  Tobacco Use   Smoking status: Never   Smokeless tobacco: Never   Tobacco comments:    Never smoke 03/07/23  Vaping Use   Vaping status: Never Used  Substance and Sexual Activity   Alcohol use: Yes    Alcohol/week: 7.0 - 14.0 standard drinks of alcohol    Types: 7 - 14 Cans of beer per week    Comment: 1-2 beers daily 03/07/23   Drug use: No   Sexual activity: Yes    Birth control/protection: Surgical  Other Topics Concern   Not on file  Social History Narrative   Not on file   Social Drivers of Health   Financial Resource Strain: Low Risk  (01/19/2024)   Overall Financial Resource Strain (CARDIA)    Difficulty of Paying Living Expenses: Not hard at all  Food Insecurity: No Food Insecurity (01/31/2024)   Hunger Vital Sign    Worried About Running Out  of Food in the Last Year: Never true    Ran Out of Food in the Last Year: Never true  Transportation Needs: No Transportation Needs (01/31/2024)   PRAPARE - Administrator, Civil Service (Medical): No    Lack of Transportation (Non-Medical): No  Physical Activity: Not on file  Stress: Not on file  Social Connections: Not on file  Intimate Partner Violence: Not At Risk (01/31/2024)   Humiliation, Afraid, Rape, and Kick questionnaire    Fear of Current or Ex-Partner: No    Emotionally Abused: No    Physically Abused: No    Sexually Abused: No  Family History  Problem Relation Age of Onset   Cerebral aneurysm Mother 59       ruptured   Hypertension Mother        severe   Heart attack Father 1   Coronary artery disease Other    Hypertension Brother        severe   Colon cancer Neg Hx    Breast cancer Neg Hx     Vitals:   08/19/24 1044  BP: (!) 138/90  Pulse: 78  SpO2: 97%  Weight: 73.4 kg (161 lb 12.8 oz)  Height: 5' 5 (1.651 m)    PHYSICAL EXAM: General:  Well appearing. No resp difficulty HEENT: normal Neck: supple. JVP 10 with prominent v waves  Carotids 2+ bilat; no bruits. No lymphadenopathy or thryomegaly appreciated. Cor: PMI nondisplaced. Regular rate & rhythm. No rubs, gallops or murmurs. Lungs: clear Abdomen: soft, nontender, nondistended. No hepatosplenomegaly. No bruits or masses. Good bowel sounds. Extremities: no cyanosis, clubbing, rash, edema Neuro: alert & orientedx3, cranial nerves grossly intact. moves all 4 extremities w/o difficulty. Affect pleasant  ASSESSMENT & PLAN:  1. Chronic systolic HF - due to NICM - Cardiac cath 2006 No CAD - Echo 08/2014: LVEF 25-30%  - Echo 06/2018: LVEF 50-55% - cMRI 12/17/2021: LV EF 20% No LGE. - Echo 01/17/2024: LVEF 20-25%, + LVH G1 DD RV ok  - cMRI 12/2021 without evidence of prior MI or amyloid  - CRT-D placed with Dr. Waddell on 01/03/2023. Interrogated personally in clinic as per HPI  - NYHA  II - Volume elevated on lasix  40 daily. Add spiro 12.5. Discussed use of sliding scale diuretics - Continue carvedilol  25 mg BID - Continue Jardiance  10 mg daily - Continue Bidil  2 tablets TID - Consider careful initiation of RAASi in upcoming visits as renal function tolerates - Labs in 1 week after spiro initiation    2. PAF: - Last A. Fib episode observed on remote device check was 01/20/2023 - Remains in NSR - Continue Eliquis  5 bid No obvious bleeding   3. HTN: - BP up ]. Add spiro as above   4. Chronic kidney disease, stage IV - Was referred to nephrology at previous visit with a Cr of 3 - SCr now 2.1-2.3 (last check 07/06/24 - 2.1)  - Recheck in 1 week with spiro  - Continue SGLT2i   Toribio Fuel, MD  11:23 AM

## 2024-08-26 ENCOUNTER — Telehealth: Payer: Self-pay

## 2024-08-26 ENCOUNTER — Ambulatory Visit

## 2024-08-26 ENCOUNTER — Encounter: Payer: Self-pay | Admitting: Pulmonary Disease

## 2024-08-26 ENCOUNTER — Other Ambulatory Visit (HOSPITAL_COMMUNITY): Payer: Self-pay

## 2024-08-26 ENCOUNTER — Ambulatory Visit (INDEPENDENT_AMBULATORY_CARE_PROVIDER_SITE_OTHER): Admitting: Pulmonary Disease

## 2024-08-26 VITALS — BP 126/86 | HR 78 | Temp 97.7°F | Ht 65.0 in | Wt 158.8 lb

## 2024-08-26 DIAGNOSIS — I428 Other cardiomyopathies: Secondary | ICD-10-CM

## 2024-08-26 DIAGNOSIS — J329 Chronic sinusitis, unspecified: Secondary | ICD-10-CM | POA: Diagnosis not present

## 2024-08-26 DIAGNOSIS — J4550 Severe persistent asthma, uncomplicated: Secondary | ICD-10-CM | POA: Diagnosis not present

## 2024-08-26 DIAGNOSIS — R0602 Shortness of breath: Secondary | ICD-10-CM

## 2024-08-26 DIAGNOSIS — I5022 Chronic systolic (congestive) heart failure: Secondary | ICD-10-CM

## 2024-08-26 DIAGNOSIS — J45998 Other asthma: Secondary | ICD-10-CM

## 2024-08-26 DIAGNOSIS — D721 Eosinophilia, unspecified: Secondary | ICD-10-CM

## 2024-08-26 LAB — PULMONARY FUNCTION TEST
DL/VA % pred: 106 %
DL/VA: 4.47 ml/min/mmHg/L
DLCO unc % pred: 70 %
DLCO unc: 14.56 ml/min/mmHg
FEF 25-75 Post: 1.63 L/s
FEF 25-75 Pre: 1.27 L/s
FEF2575-%Change-Post: 28 %
FEF2575-%Pred-Post: 67 %
FEF2575-%Pred-Pre: 52 %
FEV1-%Change-Post: 6 %
FEV1-%Pred-Post: 59 %
FEV1-%Pred-Pre: 55 %
FEV1-Post: 1.57 L
FEV1-Pre: 1.48 L
FEV1FVC-%Change-Post: 0 %
FEV1FVC-%Pred-Pre: 99 %
FEV6-%Change-Post: 6 %
FEV6-%Pred-Post: 61 %
FEV6-%Pred-Pre: 57 %
FEV6-Post: 2.02 L
FEV6-Pre: 1.9 L
FEV6FVC-%Pred-Post: 103 %
FEV6FVC-%Pred-Pre: 103 %
FVC-%Change-Post: 6 %
FVC-%Pred-Post: 58 %
FVC-%Pred-Pre: 55 %
FVC-Post: 2.02 L
FVC-Pre: 1.9 L
Post FEV1/FVC ratio: 78 %
Post FEV6/FVC ratio: 100 %
Pre FEV1/FVC ratio: 78 %
Pre FEV6/FVC Ratio: 100 %
RV % pred: 90 %
RV: 1.85 L
TLC % pred: 74 %
TLC: 3.89 L

## 2024-08-26 LAB — NITRIC OXIDE: Nitric Oxide: 63

## 2024-08-26 MED ORDER — TRELEGY ELLIPTA 200-62.5-25 MCG/ACT IN AEPB: 1.0000 | INHALATION_SPRAY | Freq: Every day | RESPIRATORY_TRACT | 11 refills | Status: AC

## 2024-08-26 NOTE — Patient Instructions (Addendum)
 VISIT SUMMARY:  During your visit, we discussed your ongoing issues with nasal congestion and shortness of breath. We reviewed your chronic sinus congestion and asthma, and we have made some adjustments to your treatment plan to help manage your symptoms more effectively.  YOUR PLAN:  -ASTHMA: Asthma is a condition where your airways become inflamed and narrow, making it hard to breathe. Your asthma is currently not well-controlled, so we will increase the strength of your Trelegy inhaler.  Make sure you rinse your mouth well after you use your Trelegy.  We are also starting the paperwork for a new medication called Dupixent, which may help reduce your airway inflammation. You will need to have training for Dupixent at the [facility] clinic, but we will wait for the results from your allergist before starting this new treatment.  -CHRONIC RHINOSINUSITIS WITH NASAL CONGESTION: Chronic rhinosinusitis is a long-term inflammation of the sinuses that causes nasal congestion. Your symptoms improve with prednisone  and antibiotics but return after stopping these medications. We will continue to follow up with your ENT specialist, [name], and you have an appointment with allergist [name] on November 5th for further evaluation. We are also considering Dupixent therapy after your allergist evaluation, as it may help with your elevated eosinophils and sinus congestion.  INSTRUCTIONS:  Please follow up with [name], the allergist, on November 5th for further evaluation. Continue your follow-ups with [name], your ENT specialist. We will coordinate your Dupixent training at the [facility] clinic once we have the allergist's recommendations.  We will see you in follow-up in 2 months time call sooner should any new problems arise.

## 2024-08-26 NOTE — Progress Notes (Signed)
 Full PFT completed today ? ?

## 2024-08-26 NOTE — Telephone Encounter (Signed)
 Received notification via telephone encounter that pt is Dupixent new start. Attempted to submit prior authorization to [employer] but it was cancelled with a message stating drug is not covered by plan and to reach out to [phone] for more information.  Called [employer] and was advised by the rep that the medication is covered and on the formulary. They ran a test claim and it stated the pt must enroll in the [facility] On program for cost assistance and must call [phone] to do so. Per chart notes pt is to get results from her appointment with an allergist before starting therapy. Will wait for pt to complete her appointment on 11/5 and reach out to have her enroll in [facility] On.

## 2024-08-26 NOTE — Patient Instructions (Signed)
 Full PFT completed today ? ?

## 2024-08-26 NOTE — Progress Notes (Signed)
 Subjective:    Patient ID: [name], female    DOB: [birth year], 63 y.o.   MRN: [id number]  Patient Care Team: Larnell Hamilton, MD as PCP - General (Internal Medicine) Lavona Agent, MD as PCP - Cardiology (Cardiology) Waddell Danelle ORN, MD as PCP - Electrophysiology (Cardiology)  Chief Complaint  Patient presents with   Asthma    No cough  or wheezing. Reports severe nasal congestion.     BACKGROUND/INTERVAL: [name] is a very complex 63 year old lifelong never smoker with a history as noted below who presents for follow-up of cough and shortness of breath due to asthma.  She was initially seen on 08 June 2024.  This is a follow-up visit, she had PFTs today.  HPI Discussed the use of AI scribe software for clinical note transcription with the patient, who gave verbal consent to proceed.  History of Present Illness   [name] is a very complex 63 year old female with chronic sinus congestion and congestive heart failure who presents with shortness of breath.  She experiences significant nasal congestion, describing her nose as 'stopped up' and unable to breathe through it. This congestion is persistent and only temporarily relieved by prednisone  and antibiotics, with symptoms returning after cessation of these medications. She uses Flonase  nasal spray but finds it difficult to inhale effectively due to the congestion. A CT scan prior to starting prednisone  and antibiotics showed complete bilateral paranasal sinus opacification as well as partial opacification of the nasal cavity bilaterally.  The scan was done by [facility] ordered by Dr. Jesus. Despite treatment, her symptoms have not improved, and she remains 'all congested up there.'  She has a history of congestive heart failure and is on Eliquis , which complicates potential surgical interventions for her sinus issues. Her eosinophil levels are elevated with eosinophils being 800 cells per microliter.  She is compliant with  her Trelegy inhaler. She is scheduled for an appointment on November 5th with an allergist and is also under the care of a specialist managing her sinus condition.      DATA 05/17/2024 eosinophils: 800 cells per microliter 05/17/2024 chest x-ray: Enlarged cardiac silhouette, defibrillator pacer in place.  No lung abnormalities. 08/26/2024 PFTs: FEV1 1.48 L or 55% predicted, FVC 1.90 L or 55% predicted, FEV1/FVC 78% no significant bronchodilator response.  There is a small airways component.  Lung volumes mildly reduced, ERV significantly reduced consistent with obesity.  Diffusion capacity mildly decreased but corrects to normal by alveolar volume.  Combined mild obstruction and restriction.  Restriction likely due to body habitus/obesity.   Review of Systems A 10 point review of systems was performed and it is as noted above otherwise negative.   Patient Active Problem List   Diagnosis Date Noted   Heart failure with reduced ejection fraction (HCC) 05/17/2024   Reactive airway disease 05/17/2024   Normocytic anemia 05/17/2024   Acute renal failure with acute tubular necrosis superimposed on stage 4 chronic kidney disease (HCC) 02/04/2024   PAF (paroxysmal atrial fibrillation) (HCC) 02/04/2024   NSVT (nonsustained ventricular tachycardia) (HCC) 02/03/2024   Heart failure (HCC) 01/31/2024   Hyperactive airway disease 01/31/2024   Acute combined systolic and diastolic congestive heart failure (HCC) 01/16/2024   CKD (chronic kidney disease), stage IV (HCC) 01/16/2024   History of CVA (cerebrovascular accident) 01/16/2024   Metabolic acidosis 01/16/2024   Acute exacerbation of CHF (congestive heart failure) (HCC) 01/16/2024   URI (upper respiratory infection) 01/16/2024   Paroxysmal A-fib (HCC)  02/04/2023   Hypercoagulable state due to paroxysmal atrial fibrillation (HCC) 02/04/2023   Acute ischemic stroke (HCC) 12/13/2021   Hyperlipidemia 02/05/2019   Advice given about COVID-19 virus  infection 02/05/2019   CKD (chronic kidney disease), stage III (HCC) 02/03/2018   Insomnia 10/14/2014   At risk for sudden cardiac death 09-06-14   Chronic systolic HF (heart failure) (HCC) 09-06-14   Essential hypertension, malignant 08/08/2014   Non compliance with medical treatment 08/07/2014   Normal coronary arteries 2006 08/07/2014   Renal insufficiency- unclear if chronic or not 08/07/2014   Acute on chronic systolic CHF (congestive heart failure), NYHA class 4 (HCC) 08/06/2014   DYSPNEA 07/04/2010   Chest pain- MI r/o 05/24/2009   Obesity (BMI 30-39.9) 02/13/2009   Essential hypertension 02/13/2009   NICM- EF 25-30% echo 08/05/14 02/13/2009    Social History   Tobacco Use   Smoking status: Never   Smokeless tobacco: Never   Tobacco comments:    Never smoke 03/07/23  Substance Use Topics   Alcohol use: Yes    Alcohol/week: 7.0 - 14.0 standard drinks of alcohol    Types: 7 - 14 Cans of beer per week    Comment: 1-2 beers daily 03/07/23    Allergies  Allergen Reactions   Chlorhexidine  Gluconate Hives    Unknown if this is the cause of the hives, raised areas around lab draw sites. No tape and area wiped with chlorhexidine .   Latex Itching    Current Meds  Medication Sig   Acetaminophen  (TYLENOL  PO) Take 1,000 mg by mouth daily as needed (for pain).   albuterol  (ACCUNEB ) 0.63 MG/3ML nebulizer solution Take 3 mLs (0.63 mg total) by nebulization every 6 (six) hours as needed for wheezing or shortness of breath.   albuterol  (VENTOLIN  HFA) 108 (90 Base) MCG/ACT inhaler Inhale 1 puff into the lungs every 6 (six) hours as needed.   apixaban  (ELIQUIS ) 5 MG TABS tablet TAKE 1 TABLET(5 MG) BY MOUTH TWICE DAILY   atorvastatin  (LIPITOR ) 80 MG tablet Take 1 tablet (80 mg total) by mouth daily.   carvedilol  (COREG ) 25 MG tablet TAKE 1 TABLET(25 MG) BY MOUTH TWICE DAILY WITH A MEAL   cetirizine (ZYRTEC) 10 MG tablet Take 10 mg by mouth daily.   cholecalciferol (VITAMIN D3) 25  MCG (1000 UNIT) tablet Take 1,000 Units by mouth daily.   empagliflozin  (JARDIANCE ) 10 MG TABS tablet Take 1 tablet (10 mg total) by mouth daily before breakfast.   ezetimibe  (ZETIA ) 10 MG tablet Take 1 tablet (10 mg total) by mouth daily.   fluticasone  (FLONASE ) 50 MCG/ACT nasal spray Place 2 sprays into both nostrils daily.   Fluticasone -Umeclidin-Vilant (TRELEGY ELLIPTA ) 200-62.5-25 MCG/ACT AEPB Inhale 1 puff into the lungs daily.   furosemide  (LASIX ) 20 MG tablet Take 2 tablets (40 mg total) by mouth daily. Take in case of weight gain 2 to 3 lbs in 24 hrs or 5 lbs in 7 days until weight back to baseline   HYDROcodone -acetaminophen  (NORCO/VICODIN) 5-325 MG tablet Take 1 tablet by mouth daily as needed for severe pain (pain score 7-10).   isosorbide -hydrALAZINE  (BIDIL ) 20-37.5 MG tablet Take 2 tablets three times daily. Please stop if blood pressure drops too low (Patient taking differently: Take 2 tablets in the morning.Take 1 tablet at noon and 1 tablet at night.  Please stop if blood pressure drops too low)   Multiple Vitamin (MULTIVITAMIN) tablet Take 1 tablet by mouth daily.   spironolactone  (ALDACTONE ) 25 MG tablet Take 0.5 tablets (  12.5 mg total) by mouth daily.   zolpidem  (AMBIEN ) 5 MG tablet Take 5 mg by mouth at bedtime as needed for sleep.   [DISCONTINUED] Fluticasone -Umeclidin-Vilant (TRELEGY ELLIPTA ) 100-62.5-25 MCG/ACT AEPB Inhale 1 puff into the lungs daily.    Immunization History  Administered Date(s) Administered   DTaP, 5 pertussis antigens 01/05/2016   Fluzone Influenza virus vaccine,trivalent (IIV3), split virus 10/20/2014, 08/04/2020   Influenza, Mdck, Trivalent,PF 6+ MOS(egg free) 12/03/2017   Influenza, Quadrivalent, Recombinant, Inj, Pf 01/08/2019   Influenza,inj,Quad PF,6+ Mos 08/30/2022   Moderna Sars-Covid-2 Vaccination 12/14/2019, 01/11/2020, 12/05/2020   Pneumococcal Conjugate-13 12/30/2014   Pneumococcal Polysaccharide-23 12/30/2014, 02/19/2022   Tdap  11/05/2011, 12/30/2014   Zoster, Live 12/30/2014        Objective:     BP 126/86   Pulse 78   Temp 97.7 F (36.5 C) (Temporal)   Ht 5' 5 (1.651 m)   Wt 158 lb 12.8 oz (72 kg)   SpO2 99%   BMI 26.43 kg/m   SpO2: 99 %  GENERAL: Overweight woman, no acute distress, fully ambulatory, no conversational dyspnea. HEAD: Normocephalic, atraumatic.  EYES: Pupils equal, round, reactive to light.  No scleral icterus.  MOUTH: Oral mucosa moist.  No thrush. NECK: Supple. No thyromegaly. Trachea midline. No JVD.  No adenopathy. PULMONARY: Good air entry bilaterally.  No adventitious sounds. CARDIOVASCULAR: S1 and S2. Regular rate and rhythm.  No rubs, murmurs or gallops heard. ABDOMEN: Benign. MUSCULOSKELETAL: No joint deformity, no clubbing, no edema.  NEUROLOGIC: No overt focal deficit. SKIN: Intact,warm,dry. PSYCH: Mood and behavior normal.   Lab Results  Component Value Date   NITRICOXIDE 63 08/26/2024  *There is elevated level of nitric oxide  consistent with increased type II inflammation and poor asthma control. *Trend: 28>> 63 ppb   Assessment & Plan:     ICD-10-CM   1. Severe persistent asthma without complication (HCC)  J45.50 Nitric oxide     2. Chronic rhinosinusitis  J32.9     3. Nonischemic cardiomyopathy (HCC)  I42.8     4. Chronic systolic HF (heart failure) (HCC)  I50.22    This issue adds complexity to her management    5. Eosinophilia, unspecified type  D72.10       Orders Placed This Encounter  Procedures   Nitric oxide     Meds ordered this encounter  Medications   Fluticasone -Umeclidin-Vilant (TRELEGY ELLIPTA ) 200-62.5-25 MCG/ACT AEPB    Sig: Inhale 1 puff into the lungs daily.    Dispense:  60 each    Refill:  11   Discussion:    Persistent asthma  Asthma with increased airway inflammation and elevated eosinophils, suggesting potential benefit from Dupixent. Current inhaler therapy is insufficient. - Increase strength of Trelegy inhaler  ICS to 200 mcg - Initiate paperwork for Dupixent therapy - Coordinate Dupixent training at our [facility] location - Proceed with allergist evaluation  Chronic rhinosinusitis with nasal congestion Chronic rhinosinusitis with persistent nasal congestion. Symptoms improve with prednisone  and antibiotics but recur post-cessation. CT scan shows significant sinus congestion. Surgery considered but cautious due to CHF and anticoagulation with Eliquis . Elevated eosinophils suggest potential benefit from Dupixent. - Continue follow-up with ENT specialist Dr. Jesus - Has pending appointment with Dr. Luke, Allergy and Immunology, on November 5 for further evaluation - Referred to pharmacy team for Dupixent initiation    Will see the patient in follow-up in 2 months time call sooner should any problems arise.  Advised if symptoms do not improve or worsen, to please contact office  for sooner follow up or seek emergency care.    I spent 40 minutes of dedicated to the care of this patient on the date of this encounter to include pre-visit review of records, face-to-face time with the patient discussing conditions above, post visit ordering of testing, clinical documentation with the electronic health record, making appropriate referrals as documented, and communicating necessary findings to members of the patients care team.     C. Leita Sanders, MD Advanced Bronchoscopy PCCM [facility]    *This note was generated using voice recognition software/Dragon and/or AI transcription program.  Despite best efforts to proofread, errors can occur which can change the meaning. Any transcriptional errors that result from this process are unintentional and may not be fully corrected at the time of dictation.

## 2024-08-26 NOTE — Telephone Encounter (Signed)
 Patient seen in office today, by Dr. Tamea. Patient will be started on Dupixent. Forms signed, will be faxed to pharmacy team.

## 2024-08-27 ENCOUNTER — Ambulatory Visit (HOSPITAL_COMMUNITY)
Admission: RE | Admit: 2024-08-27 | Discharge: 2024-08-27 | Disposition: A | Discharge status: Home / Self Care | Source: Ambulatory Visit | Attending: Cardiology | Admitting: Cardiology

## 2024-08-27 DIAGNOSIS — I5022 Chronic systolic (congestive) heart failure: Secondary | ICD-10-CM | POA: Insufficient documentation

## 2024-08-27 LAB — BASIC METABOLIC PANEL WITH GFR
Anion gap: 12 (ref 5–15)
BUN: 35 mg/dL — ABNORMAL HIGH (ref 6–20)
CO2: 24 mmol/L (ref 22–32)
Calcium: 9.2 mg/dL (ref 8.9–10.3)
Chloride: 106 mmol/L (ref 98–111)
Creatinine, Ser: 2.4 mg/dL — ABNORMAL HIGH (ref 0.44–1.00)
GFR, Estimated: 23 mL/min — ABNORMAL LOW (ref >60)
Glucose, Bld: 147 mg/dL — ABNORMAL HIGH (ref 70–99)
Potassium: 4.1 mmol/L (ref 3.5–5.1)
Sodium: 142 mmol/L (ref 135–145)

## 2024-08-27 NOTE — Telephone Encounter (Signed)
 Received . DPX BIV started by Chasadee in new phone encounter

## 2024-08-29 NOTE — Progress Notes (Unsigned)
 Patient ID: [name]                 DOB: [birth year]                    MRN: [id number]      HPI: [name] is a 63 y.o. female patient referred to lipid clinic by [name] Shams, NP. PMH is significant for HTN, NICM, CVA, CKD stage IV, HLD, CRT-D.   History of NICM as determined by cardiac catheterization that showed no CAD. LDL-C in 03/2024 was 109, LDL-direct on 07/06/2024 was 77.  Ask about diet/exercise, could be a candidate for Repatha if amenable to injections, has [employer], assess adherence  Reviewed options for lowering LDL cholesterol, including ezetimibe , PCSK-9 inhibitors, bempedoic acid and inclisiran.  Discussed mechanisms of action, dosing, side effects and potential decreases in LDL cholesterol.  Also reviewed cost information and potential options for patient assistance.  Current Medications: atorvastatin  80 mg daily, ezetimibe  10 mg daily Intolerances: n/a Risk Factors: CVA, HTN LDL-C goal: < 55 mg/dL ApoB goal: < 70 mg/dL  Diet:   Exercise:   Family History:  Family History  Problem Relation Age of Onset   Cerebral aneurysm Mother 34       ruptured   Hypertension Mother        severe   Heart attack Father 68   Coronary artery disease Other    Hypertension Brother        severe   Colon cancer Neg Hx    Breast cancer Neg Hx     Social History:   Labs: Lipid Panel     Component Value Date/Time   CHOL 181 03/22/2024 1024   TRIG 90 03/22/2024 1024   HDL 56 03/22/2024 1024   CHOLHDL 3.2 03/22/2024 1024   CHOLHDL 2.9 12/14/2021 0354   VLDL 11 12/14/2021 0354   LDLCALC 109 (H) 03/22/2024 1024   LDLDIRECT 77 07/06/2024 1623   LABVLDL 16 03/22/2024 1024    Past Medical History:  Diagnosis Date   CHF (congestive heart failure) (HCC)    Hypertension    Overweight(278.02)    Sciatica    Secondary cardiomyopathy, unspecified    EF now 50%    Current Outpatient Medications on File Prior to Visit  Medication Sig Dispense Refill    Acetaminophen  (TYLENOL  PO) Take 1,000 mg by mouth daily as needed (for pain).     albuterol  (ACCUNEB ) 0.63 MG/3ML nebulizer solution Take 3 mLs (0.63 mg total) by nebulization every 6 (six) hours as needed for wheezing or shortness of breath. 75 mL 0   albuterol  (VENTOLIN  HFA) 108 (90 Base) MCG/ACT inhaler Inhale 1 puff into the lungs every 6 (six) hours as needed.     apixaban  (ELIQUIS ) 5 MG TABS tablet TAKE 1 TABLET(5 MG) BY MOUTH TWICE DAILY 60 tablet 6   atorvastatin  (LIPITOR ) 80 MG tablet Take 1 tablet (80 mg total) by mouth daily. 30 tablet 1   carvedilol  (COREG ) 25 MG tablet TAKE 1 TABLET(25 MG) BY MOUTH TWICE DAILY WITH A MEAL 180 tablet 3   cetirizine (ZYRTEC) 10 MG tablet Take 10 mg by mouth daily.     cholecalciferol (VITAMIN D3) 25 MCG (1000 UNIT) tablet Take 1,000 Units by mouth daily.     empagliflozin  (JARDIANCE ) 10 MG TABS tablet Take 1 tablet (10 mg total) by mouth daily before breakfast. 90 tablet 3   ezetimibe  (ZETIA ) 10 MG tablet Take 1 tablet (10 mg total)  by mouth daily. 90 tablet 1   fluticasone  (FLONASE ) 50 MCG/ACT nasal spray Place 2 sprays into both nostrils daily.     Fluticasone -Umeclidin-Vilant (TRELEGY ELLIPTA ) 200-62.5-25 MCG/ACT AEPB Inhale 1 puff into the lungs daily. 60 each 11   furosemide  (LASIX ) 20 MG tablet Take 2 tablets (40 mg total) by mouth daily. Take in case of weight gain 2 to 3 lbs in 24 hrs or 5 lbs in 7 days until weight back to baseline 30 tablet 1   HYDROcodone -acetaminophen  (NORCO/VICODIN) 5-325 MG tablet Take 1 tablet by mouth daily as needed for severe pain (pain score 7-10).     isosorbide -hydrALAZINE  (BIDIL ) 20-37.5 MG tablet Take 2 tablets three times daily. Please stop if blood pressure drops too low (Patient taking differently: Take 2 tablets in the morning.Take 1 tablet at noon and 1 tablet at night.  Please stop if blood pressure drops too low) 180 tablet 11   Multiple Vitamin (MULTIVITAMIN) tablet Take 1 tablet by mouth daily.      spironolactone  (ALDACTONE ) 25 MG tablet Take 0.5 tablets (12.5 mg total) by mouth daily. 45 tablet 3   zolpidem  (AMBIEN ) 5 MG tablet Take 5 mg by mouth at bedtime as needed for sleep.  1   No current facility-administered medications on file prior to visit.    Allergies  Allergen Reactions   Chlorhexidine  Gluconate Hives    Unknown if this is the cause of the hives, raised areas around lab draw sites. No tape and area wiped with chlorhexidine .   Latex Itching    Assessment/Plan:  1. Hyperlipidemia -  No problem-specific Assessment & Plan notes found for this encounter.    Thank you,  [name], PharmD PGY2 Cardiology Pharmacy Resident

## 2024-08-30 ENCOUNTER — Other Ambulatory Visit: Payer: Self-pay

## 2024-08-30 ENCOUNTER — Ambulatory Visit: Admitting: Pharmacist

## 2024-09-01 MED ORDER — EZETIMIBE 10 MG PO TABS: 10.0000 mg | ORAL_TABLET | Freq: Every day | ORAL | 3 refills | Status: DC

## 2024-09-02 ENCOUNTER — Encounter: Payer: Self-pay | Admitting: Internal Medicine

## 2024-09-08 ENCOUNTER — Encounter: Payer: Self-pay | Admitting: Allergy

## 2024-09-08 ENCOUNTER — Other Ambulatory Visit: Payer: Self-pay

## 2024-09-08 ENCOUNTER — Ambulatory Visit (INDEPENDENT_AMBULATORY_CARE_PROVIDER_SITE_OTHER): Admitting: Allergy

## 2024-09-08 VITALS — BP 128/90 | HR 84 | Temp 98.2°F | Resp 16 | Ht 64.0 in | Wt 154.1 lb

## 2024-09-08 DIAGNOSIS — J3089 Other allergic rhinitis: Secondary | ICD-10-CM

## 2024-09-08 DIAGNOSIS — J4550 Severe persistent asthma, uncomplicated: Secondary | ICD-10-CM

## 2024-09-08 DIAGNOSIS — J339 Nasal polyp, unspecified: Secondary | ICD-10-CM | POA: Diagnosis not present

## 2024-09-08 MED ORDER — AZELASTINE HCL 0.1 % NA SOLN: 1.0000 | Freq: Two times a day (BID) | NASAL | 5 refills | Status: AC | PRN

## 2024-09-08 NOTE — Progress Notes (Signed)
 New Patient Note  RE: [name] MRN: [id number] DOB: [birth year] Date of Office Visit: 09/08/2024  Consult requested by: Jesus Oliphant, MD Primary care provider: Larnell Hamilton, MD  Chief Complaint: Establish Care (She presents as nasal and face congestion, runny/stuffy nose, production discharged from nose is clear and green. She also has watery and redness eyes. Sometime, she has wheezing. )  History of Present Illness: I had the pleasure of seeing [name] for initial evaluation at the [facility] on 09/08/2024. She is a 63 y.o. female, who is referred here by Jesus Oliphant, MD for the evaluation of nasal congestion.  Discussed the use of AI scribe software for clinical note transcription with the patient, who gave verbal consent to proceed.    She has experienced chronic nasal congestion, rhinorrhea, and anosmia for the past year. Initially, her symptoms included coughing, nasal congestion, and sneezing, but the cough has resolved. The congestion is persistent and non-seasonal, requiring the use of a box and a half of tissues daily.  She has tried multiple treatments including Zyrtec, Flonase , nasal rinses, prednisone , and antibiotics. Prednisone  provides temporary relief, but symptoms recur after discontinuation. A CT scan showed significant nasal congestion and polyps. Due to severe congestion, a nasal endoscopy was not performed. She has not undergone allergy testing.  She reports anosmia, but no headaches. No history of nasal surgery. She has asthma, managed with Trelegy once daily and albuterol  as needed, approximately twice a month. No oral steroids required for asthma management.  Her past medical history includes congestive heart failure, a stroke last year, hypertension, diabetes, and hyperlipidemia, managed with medication. No food or medication allergies, and no history of eczema or frequent infections.  She lives in [place], does not smoke, and has no  pets. No recent fevers, chills, chest tightness, or shortness of breath.      She reports symptoms of nasal congestion, coughing, wheezing, rhinorrhea. Symptoms have been going on for 1 year. The symptoms are present all year around. Anosmia: yes. Headache: no. She has used zyrtec, Flonase  with minimal improvement in symptoms. Sinus infections: yes. Previous work up includes: none. Previous ENT evaluation: yes. Previous sinus imaging: yes. History of nasal polyps: yes. Last eye exam: 1 year ago. History of reflux: denies.  08/17/2024 ENT note: Return visit. She felt significant improvement while on the antibiotics and steroids but as soon as she stopped she became completely obstructed again. On exam there is severe diffuse nasal mucosal congestion. Unable to visualize the airways at all. Follow-up CT reveals complete opacification of all paranasal sinuses. Based on these findings we discussed options including endoscopic sinus surgery versus Dupixent therapy. She is scheduled to see the allergist in about 2-1/2 weeks. She suffers with congestive heart failure and is on anticoagulation medication. Surgery will be a last resort for her. Recommend we see what the allergist has to say about Dupixent therapy first.   07/16/2024 CT sinus: IMPRESSION:  Limited sinus CT demonstrates complete bilateral paranasal sinus opacification as well as partial opacification of the nasal cavity bilaterally. No definite corresponding bony destructive changes.   Assessment and Plan: [name] is a 63 y.o. female with:    Nasal polyps with chronic allergic rhinitis Chronic nasal congestion and anosmia with confirmed nasal polyps. Symptoms recur after prednisone . Patient prefers non-surgical options and less frequent dosing as she plans on coming into office for biologic administration. No prior allergy testing. Return for allergy skin testing. Will make additional recommendations based  on results. Use over the  counter antihistamines such as Zyrtec (cetirizine), Claritin  (loratadine ), Allegra (fexofenadine), or Xyzal (levocetirizine) daily as needed. May take twice a day during allergy flares. May switch antihistamines every few months. Use azelastine nasal spray 1-2 sprays per nostril twice a day as needed for runny nose/drainage. Use Flonase  (fluticasone ) nasal spray 1-2 sprays per nostril once a day as needed for nasal congestion.  Nasal saline spray (i.e., Simply Saline) or nasal saline lavage (i.e., NeilMed) is recommended as needed and prior to medicated nasal sprays. Will try to get Tezspire approved for nasal polyps as it is a less frequent dosing than Dupixent. This biologic may also help with her asthma control. Handouts given for both Tezspire and Dupixent.   Severe persistent asthma Managed by pulmonology. Normal spirometry today. Daily controller medication(s): continue Trelegy 200mcg 1 puff once a day and rinse mouth after each use.  May use albuterol  rescue inhaler 2 puffs or nebulizer every 4 to 6 hours as needed for shortness of breath, chest tightness, coughing, and wheezing. May use albuterol  rescue inhaler 2 puffs 5 to 15 minutes prior to strenuous physical activities. Monitor frequency of use - if you need to use it more than twice per week on a consistent basis let us  know.   Return for Skin testing.  Meds ordered this encounter  Medications   azelastine (ASTELIN) 0.1 % nasal spray    Sig: Place 1-2 sprays into both nostrils 2 (two) times daily as needed (nasal drainage). Use in each nostril as directed    Dispense:  30 mL    Refill:  5   Lab Orders  No laboratory test(s) ordered today    Other allergy screening: Asthma: yes Being followed by pulmonology.  Currently on Trelegy 200mcg and albuterol  prn and nebulizer prn. Using rescue inhaler about 2 times per month.  Patient has CHF and follows with cardiology. Food allergy: no Medication allergy: no Hymenoptera  allergy: no Urticaria: no Eczema:no History of recurrent infections suggestive of immunodeficency: no  Diagnostics: Spirometry:  Tracings reviewed. Her effort: Good reproducible efforts. FVC: 1.97L FEV1: 1.55L, 72% predicted FEV1/FVC ratio: 79% Interpretation: Spirometry consistent with normal pattern.  Please see scanned spirometry results for details.  Results discussed with patient/family.   Past Medical History: Patient Active Problem List   Diagnosis Date Noted   Heart failure with reduced ejection fraction (HCC) 05/17/2024   Reactive airway disease 05/17/2024   Normocytic anemia 05/17/2024   Acute renal failure with acute tubular necrosis superimposed on stage 4 chronic kidney disease (HCC) 02/04/2024   PAF (paroxysmal atrial fibrillation) (HCC) 02/04/2024   NSVT (nonsustained ventricular tachycardia) (HCC) 02/03/2024   Heart failure (HCC) 01/31/2024   Hyperactive airway disease 01/31/2024   Acute combined systolic and diastolic congestive heart failure (HCC) 01/16/2024   CKD (chronic kidney disease), stage IV (HCC) 01/16/2024   History of CVA (cerebrovascular accident) 01/16/2024   Metabolic acidosis 01/16/2024   Acute exacerbation of CHF (congestive heart failure) (HCC) 01/16/2024   URI (upper respiratory infection) 01/16/2024   Paroxysmal A-fib (HCC) 02/04/2023   Hypercoagulable state due to paroxysmal atrial fibrillation (HCC) 02/04/2023   Acute ischemic stroke (HCC) 12/13/2021   Hyperlipidemia 02/05/2019   Advice given about COVID-19 virus infection 02/05/2019   CKD (chronic kidney disease), stage III (HCC) 02/03/2018   Insomnia 10/14/2014   At risk for sudden cardiac death 09-13-2014   Chronic systolic HF (heart failure) (HCC) 09/13/14   Essential hypertension, malignant 08/08/2014   Non compliance with  medical treatment 08/07/2014   Normal coronary arteries 2006 08/07/2014   Renal insufficiency- unclear if chronic or not 08/07/2014   Acute on chronic  systolic CHF (congestive heart failure), NYHA class 4 (HCC) 08/06/2014   DYSPNEA 07/04/2010   Chest pain- MI r/o 05/24/2009   Obesity (BMI 30-39.9) 02/13/2009   Essential hypertension 02/13/2009   NICM- EF 25-30% echo 08/05/14 02/13/2009   Past Medical History:  Diagnosis Date   CHF (congestive heart failure) (HCC)    Hypertension    Overweight(278.02)    Sciatica    Secondary cardiomyopathy, unspecified    EF now 50%   Past Surgical History: Past Surgical History:  Procedure Laterality Date   ABDOMINAL HYSTERECTOMY     BIV ICD INSERTION CRT-D N/A 01/03/2023   Procedure: BIV ICD INSERTION CRT-D;  Surgeon: Waddell Danelle ORN, MD;  Location: [facility] INVASIVE CV LAB;  Service: Cardiovascular;  Laterality: N/A;   Medication List:  Current Outpatient Medications  Medication Sig Dispense Refill   Acetaminophen  (TYLENOL  PO) Take 1,000 mg by mouth daily as needed (for pain).     albuterol  (ACCUNEB ) 0.63 MG/3ML nebulizer solution Take 3 mLs (0.63 mg total) by nebulization every 6 (six) hours as needed for wheezing or shortness of breath. 75 mL 0   albuterol  (VENTOLIN  HFA) 108 (90 Base) MCG/ACT inhaler Inhale 1 puff into the lungs every 6 (six) hours as needed.     apixaban  (ELIQUIS ) 5 MG TABS tablet TAKE 1 TABLET(5 MG) BY MOUTH TWICE DAILY 60 tablet 6   atorvastatin  (LIPITOR ) 80 MG tablet Take 1 tablet (80 mg total) by mouth daily. 30 tablet 1   azelastine (ASTELIN) 0.1 % nasal spray Place 1-2 sprays into both nostrils 2 (two) times daily as needed (nasal drainage). Use in each nostril as directed 30 mL 5   carvedilol  (COREG ) 25 MG tablet TAKE 1 TABLET(25 MG) BY MOUTH TWICE DAILY WITH A MEAL 180 tablet 3   cetirizine (ZYRTEC) 10 MG tablet Take 10 mg by mouth daily.     cholecalciferol (VITAMIN D3) 25 MCG (1000 UNIT) tablet Take 1,000 Units by mouth daily.     empagliflozin  (JARDIANCE ) 10 MG TABS tablet Take 1 tablet (10 mg total) by mouth daily before breakfast. 90 tablet 3   ezetimibe  (ZETIA ) 10 MG  tablet Take 1 tablet (10 mg total) by mouth daily. 90 tablet 3   fluticasone  (FLONASE ) 50 MCG/ACT nasal spray Place 2 sprays into both nostrils daily.     Fluticasone -Umeclidin-Vilant (TRELEGY ELLIPTA ) 200-62.5-25 MCG/ACT AEPB Inhale 1 puff into the lungs daily. 60 each 11   furosemide  (LASIX ) 20 MG tablet Take 2 tablets (40 mg total) by mouth daily. Take in case of weight gain 2 to 3 lbs in 24 hrs or 5 lbs in 7 days until weight back to baseline 30 tablet 1   HYDROcodone -acetaminophen  (NORCO/VICODIN) 5-325 MG tablet Take 1 tablet by mouth daily as needed for severe pain (pain score 7-10).     isosorbide -hydrALAZINE  (BIDIL ) 20-37.5 MG tablet Take 2 tablets three times daily. Please stop if blood pressure drops too low (Patient taking differently: Take 2 tablets in the morning.Take 1 tablet at noon and 1 tablet at night.  Please stop if blood pressure drops too low) 180 tablet 11   Multiple Vitamin (MULTIVITAMIN) tablet Take 1 tablet by mouth daily.     spironolactone  (ALDACTONE ) 25 MG tablet Take 0.5 tablets (12.5 mg total) by mouth daily. 45 tablet 3   zolpidem  (AMBIEN ) 5 MG tablet Take 5  mg by mouth at bedtime as needed for sleep.  1   No current facility-administered medications for this visit.   Allergies: Allergies  Allergen Reactions   Chlorhexidine  Gluconate Hives    Unknown if this is the cause of the hives, raised areas around lab draw sites. No tape and area wiped with chlorhexidine .   Latex Itching   Social History: Social History   Socioeconomic History   Marital status: Single    Spouse name: Not on file   Number of children: 3   Years of education: Not on file   Highest education level: High school graduate  Occupational History   Not on file  Tobacco Use   Smoking status: Never   Smokeless tobacco: Never   Tobacco comments:    Never smoke 03/07/23  Vaping Use   Vaping status: Never Used  Substance and Sexual Activity   Alcohol use: Yes    Alcohol/week: 7.0 - 14.0  standard drinks of alcohol    Types: 7 - 14 Cans of beer per week    Comment: 1-2 beers daily 03/07/23   Drug use: No   Sexual activity: Yes    Birth control/protection: Surgical  Other Topics Concern   Not on file  Social History Narrative   Not on file   Social Drivers of Health   Financial Resource Strain: Low Risk  (01/19/2024)   Overall Financial Resource Strain (CARDIA)    Difficulty of Paying Living Expenses: Not hard at all  Food Insecurity: No Food Insecurity (01/31/2024)   Hunger Vital Sign    Worried About Running Out of Food in the Last Year: Never true    Ran Out of Food in the Last Year: Never true  Transportation Needs: No Transportation Needs (01/31/2024)   PRAPARE - Administrator, Civil Service (Medical): No    Lack of Transportation (Non-Medical): No  Physical Activity: Not on file  Stress: Not on file  Social Connections: Not on file   Lives in an apartment. Smoking: denies Occupation: medication tech  Environmental HistorySurveyor, Minerals in the house: no Engineer, Civil (consulting) in the family room: yes Carpet in the bedroom: no Heating: electric Cooling: central Pet: no  Family History: Family History  Problem Relation Age of Onset   Cerebral aneurysm Mother 45       ruptured   Hypertension Mother        severe   Heart attack Father 15   Coronary artery disease Other    Hypertension Brother        severe   Colon cancer Neg Hx    Breast cancer Neg Hx    Problem                               Relation Asthma                                   no Eczema                                no Food allergy                          no Allergic rhino conjunctivitis     no  Review of Systems  Constitutional:  Negative  for appetite change, chills, fever and unexpected weight change.  HENT:  Positive for congestion, rhinorrhea and sneezing.   Eyes:  Negative for itching.  Respiratory:  Negative for cough, chest tightness, shortness of breath and  wheezing.   Cardiovascular:  Negative for chest pain.  Gastrointestinal:  Negative for abdominal pain.  Genitourinary:  Negative for difficulty urinating.  Skin:  Negative for rash.  Neurological:  Negative for headaches.    Objective: BP (!) 128/90 (BP Location: Left Arm, Patient Position: Sitting, Cuff Size: Normal)   Pulse 84   Temp 98.2 F (36.8 C) (Temporal)   Resp 16   Ht 5' 4 (1.626 m)   Wt 154 lb 1.6 oz (69.9 kg)   SpO2 99%   BMI 26.45 kg/m  Body mass index is 26.45 kg/m. Physical Exam Vitals and nursing note reviewed.  Constitutional:      Appearance: Normal appearance. She is well-developed.  HENT:     Head: Normocephalic and atraumatic.     Right Ear: Tympanic membrane and external ear normal.     Left Ear: Tympanic membrane and external ear normal.     Nose: Rhinorrhea present.     Mouth/Throat:     Mouth: Mucous membranes are moist.     Pharynx: Oropharynx is clear.  Eyes:     Conjunctiva/sclera: Conjunctivae normal.  Cardiovascular:     Rate and Rhythm: Normal rate and regular rhythm.     Heart sounds: Normal heart sounds. No murmur heard.    No friction rub. No gallop.  Pulmonary:     Effort: Pulmonary effort is normal.     Breath sounds: Normal breath sounds. No wheezing, rhonchi or rales.  Musculoskeletal:     Cervical back: Neck supple.  Skin:    General: Skin is warm.     Findings: No rash.  Neurological:     Mental Status: She is alert and oriented to person, place, and time.  Psychiatric:        Behavior: Behavior normal.    The plan was reviewed with the patient/family, and all questions/concerned were addressed.  It was my pleasure to see [name] today and participate in her care. Please feel free to contact me with any questions or concerns.  Sincerely,  Orlan Cramp, DO Allergy & Immunology  [facility]  [place] office: [phone] [facility] office: [phone]

## 2024-09-08 NOTE — Patient Instructions (Signed)
 Rhinitis  Return for allergy skin testing. Will make additional recommendations based on results. Make sure you don't take any antihistamines for 3 days before the skin testing appointment. Don't put any lotion on the back and arms on the day of testing.  Must be in good health and not ill. No vaccines/injections/antibiotics within the past 7 days.  Plan on being here for 30-60 minutes.  Use over the counter antihistamines such as Zyrtec (cetirizine), Claritin  (loratadine ), Allegra (fexofenadine), or Xyzal (levocetirizine) daily as needed. May take twice a day during allergy flares. May switch antihistamines every few months. Stop 3 days before skin testing.  Use azelastine nasal spray 1-2 sprays per nostril twice a day as needed for runny nose/drainage. Stop 3 days before skin testing.  Use Flonase  (fluticasone ) nasal spray 1-2 sprays per nostril once a day as needed for nasal congestion.  Nasal saline spray (i.e., Simply Saline) or nasal saline lavage (i.e., NeilMed) is recommended as needed and prior to medicated nasal sprays.  Nasal polyps Will try to get Tezspire approved for this. This may also help with asthma.  This is an injection every 4 weeks. Dupixent is every 2 weeks. Handout given.   Asthma Managed by pulmonology. Daily controller medication(s): continue Trelegy 200mcg 1 puff once a day and rinse mouth after each use.  May use albuterol  rescue inhaler 2 puffs or nebulizer every 4 to 6 hours as needed for shortness of breath, chest tightness, coughing, and wheezing. May use albuterol  rescue inhaler 2 puffs 5 to 15 minutes prior to strenuous physical activities. Monitor frequency of use - if you need to use it more than twice per week on a consistent basis let us  know.  Breathing control goals:  Full participation in all desired activities (may need albuterol  before activity) Albuterol  use two times or less a week on average (not counting use with activity) Cough interfering  with sleep two times or less a month Oral steroids no more than once a year No hospitalizations   Follow up for skin testing.

## 2024-09-08 NOTE — Progress Notes (Signed)
 ADVANCED HF CLINIC NOTE  Primary Care: Larnell Hamilton, MD Primary Cardiologist: Lynwood Schilling, MD Nephrology: [facility] Cardiologist: Dr. Cherrie  HPI: [name] is a 63 y.o. 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 Cardiac cath 2006 No CAD  Echo 08/2014: LVEF 25-30%  Echo 11/2014: LVEF 40-45% and grade 1 DD Echo 06/2018: LVEF 50-55%   cMRI 12/17/2021: LV EF 20% No LGE. Stopped entresto  and Farxiga due to worsening kidney function, started on hydralazine  and nitrites. Negative for sleep apnea.   Echo 1/24 EF 20-25%, G2 DD. Given persistent reduction in EF and LBBB, s/p Medtronic BIV ICD (CRT-D) 3/24.   Admitted 3/25 for acute on chronic combined congestive HF. Ltd Echo showed EF 20-25%   Readmitted 3/25 for recurrent volume overload.   Seen in the ED 04/25/24 for cough and SOB. CXR showed cardiomegaly with central pulmonary vascular congestion. K was 3.2. BNP was elevated 436. Patient was offered admission for diuresis but declined. Treated with DuoNeb, IV Lasix  20 mg and KCL 40 MEQ.    Seen in the ED 05/17/24 for worsening cough and shortness of breath, Was requesting IV lasix  and nebulizer treatment. Chest X-ray showed no acute process. EKG at that time sinus rhythm with occasional PVC's, LBBB. BNP 496. Troponins were 32>> 38. Admitted overnight, strict I/O's, with IV diuresis And breathing treatments.   Pulm follow up 8/25 dx with asthma, prescribed trelegy ellipta , albuterol , and airsupra rescue inhaler, ordered PFT's, scheduled for 08/2024.   Today she returns for HF follow up. Overall feeling fine. She is not SOB walking up steps to her apartment. 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 Denies palpitations, abnormal bleeding, CP, dizziness, edema, or PND/Orthopnea. Appetite ok. Weight at home 153 pounds. Taking all medications. Lives with 75  y/o nephew.   Cardiac Testing  - Ltd Echo 3/25: EF 20-25%, RV ok - Echo 2/25: EF 20%, RV ok - Echo 1/24: EF 20-25% - cMRI 2/23: no clot, no LGE or amyloid - Echo 2/23: EF 20-25% - Echo 2018: EF 50-55% - Echo 2015: EF 25-30% - Cath 03/2005: normal cors - Echo 2006: EF 30%  Past Medical History:  Diagnosis Date   CHF (congestive heart failure) (HCC)    Hypertension    Overweight(278.02)    Sciatica    Secondary cardiomyopathy, unspecified    EF now 50%   Current Outpatient Medications  Medication Sig Dispense Refill   Acetaminophen  (TYLENOL  PO) Take 1,000 mg by mouth daily as needed (for pain).     albuterol  (ACCUNEB ) 0.63 MG/3ML nebulizer solution Take 3 mLs (0.63 mg total) by nebulization every 6 (six) hours as needed for wheezing or shortness of breath. 75 mL 0   albuterol  (VENTOLIN  HFA) 108 (90 Base) MCG/ACT inhaler Inhale 1 puff into the lungs every 6 (six) hours as needed.     apixaban  (ELIQUIS ) 5 MG TABS tablet TAKE 1 TABLET(5 MG) BY MOUTH TWICE DAILY 60 tablet 6   atorvastatin  (LIPITOR ) 80 MG tablet Take 1 tablet (80 mg total) by mouth daily. 30 tablet 1   azelastine (ASTELIN) 0.1 % nasal spray Place 1-2 sprays into both nostrils 2 (two) times daily as needed (nasal drainage). Use in each nostril as directed 30 mL 5   carvedilol  (COREG ) 25 MG tablet TAKE 1 TABLET(25 MG) BY MOUTH TWICE DAILY WITH A MEAL  180 tablet 3   cholecalciferol (VITAMIN D3) 25 MCG (1000 UNIT) tablet Take 1,000 Units by mouth daily.     empagliflozin  (JARDIANCE ) 10 MG TABS tablet Take 1 tablet (10 mg total) by mouth daily before breakfast. 90 tablet 3   ezetimibe  (ZETIA ) 10 MG tablet Take 1 tablet (10 mg total) by mouth daily. 90 tablet 3   fluticasone  (FLONASE ) 50 MCG/ACT nasal spray Place 2 sprays into both nostrils daily.     Fluticasone -Umeclidin-Vilant (TRELEGY ELLIPTA ) 200-62.5-25 MCG/ACT AEPB Inhale 1 puff into the lungs daily. 60 each 11   furosemide  (LASIX ) 20 MG tablet Take 2 tablets (40 mg  total) by mouth daily. Take in case of weight gain 2 to 3 lbs in 24 hrs or 5 lbs in 7 days until weight back to baseline 30 tablet 1   HYDROcodone -acetaminophen  (NORCO/VICODIN) 5-325 MG tablet Take 1 tablet by mouth daily as needed for severe pain (pain score 7-10).     isosorbide -hydrALAZINE  (BIDIL ) 20-37.5 MG tablet Take 2 tablets three times daily. Please stop if blood pressure drops too low 180 tablet 11   Multiple Vitamin (MULTIVITAMIN) tablet Take 1 tablet by mouth daily.     spironolactone  (ALDACTONE ) 25 MG tablet Take 0.5 tablets (12.5 mg total) by mouth daily. 45 tablet 3   zolpidem  (AMBIEN ) 5 MG tablet Take 5 mg by mouth at bedtime as needed for sleep.  1   cetirizine (ZYRTEC) 10 MG tablet Take 10 mg by mouth daily.     No current facility-administered medications for this encounter.   Allergies  Allergen Reactions   Chlorhexidine  Gluconate Hives    Unknown if this is the cause of the hives, raised areas around lab draw sites. No tape and area wiped with chlorhexidine .   Latex Itching   Social History   Socioeconomic History   Marital status: Single    Spouse name: Not on file   Number of children: 3   Years of education: Not on file   Highest education level: High school graduate  Occupational History   Not on file  Tobacco Use   Smoking status: Never   Smokeless tobacco: Never   Tobacco comments:    Never smoke 03/07/23  Vaping Use   Vaping status: Never Used  Substance and Sexual Activity   Alcohol use: Yes    Alcohol/week: 7.0 - 14.0 standard drinks of alcohol    Types: 7 - 14 Cans of beer per week    Comment: 1-2 beers daily 03/07/23   Drug use: No   Sexual activity: Yes    Birth control/protection: Surgical  Other Topics Concern   Not on file  Social History Narrative   Not on file   Social Drivers of Health   Financial Resource Strain: Low Risk  (01/19/2024)   Overall Financial Resource Strain (CARDIA)    Difficulty of Paying Living Expenses: Not hard  at all  Food Insecurity: No Food Insecurity (01/31/2024)   Hunger Vital Sign    Worried About Running Out of Food in the Last Year: Never true    Ran Out of Food in the Last Year: Never true  Transportation Needs: No Transportation Needs (01/31/2024)   PRAPARE - Administrator, Civil Service (Medical): No    Lack of Transportation (Non-Medical): No  Physical Activity: Not on file  Stress: Not on file  Social Connections: Not on file  Intimate Partner Violence: Not At Risk (01/31/2024)   Humiliation, Afraid, Rape, and Kick questionnaire  Fear of Current or Ex-Partner: No    Emotionally Abused: No    Physically Abused: No    Sexually Abused: No   Family History  Problem Relation Age of Onset   Cerebral aneurysm Mother 68       ruptured   Hypertension Mother        severe   Heart attack Father 36   Coronary artery disease Other    Hypertension Brother        severe   Colon cancer Neg Hx    Breast cancer Neg Hx    Wt Readings from Last 3 Encounters:  09/13/24 71.4 kg (157 lb 6.4 oz)  09/08/24 69.9 kg (154 lb 1.6 oz)  08/26/24 72 kg (158 lb 12.8 oz)   BP 130/84   Pulse 79   Wt 71.4 kg (157 lb 6.4 oz)   SpO2 98%   BMI 27.02 kg/m   PHYSICAL EXAM: General:  NAD. No resp difficulty, walked into clinic HEENT: Normal Neck: Supple. No JVD. Cor: Regular rate & rhythm. No rubs, gallops or murmurs. Lungs: Clear Abdomen: Soft, nontender, nondistended.  Extremities: No cyanosis, clubbing, rash, edema Neuro: Alert & oriented x 3, moves all 4 extremities w/o difficulty. Affect pleasant.  Device interrogation (personally reviewed): OptiVol ok, 5.4 hr/day activity, no VT, 0% AF burden, 95.8% BiV paced  ASSESSMENT & PLAN:  1. Chronic systolic HF - due to NICM - Cardiac cath 2006 No CAD - Echo 10/15: EF 25-30%  - Echo 8/19: EF 50-55% - cMRI 2/23: LVEF 20% No LGE. - Echo 3/25: EF 20-25%, + LVH G1 DD RV ok  - cMRI 2/23 without evidence of prior MI or amyloid  - s/p  CRT-D 3/24 - Ltd echo 3/25: EF 20-25%. - NYHA I. Volume ok on exam and by device interrogation. - Continue Lasix  40 mg daily - Continue spiro 12.5 mg daily. - Continue carvedilol  25 mg bid - Continue Jardiance  10 mg daily - Continue Bidil  2 tablets tid - Consider careful initiation of RAASi, pending today's labs - Labs today.   2. PAF - Last A. Fib episode observed on remote device check was 01/20/2023 - Regular on exam. - Continue Eliquis  5 bid. No obvious bleeding   3. HTN - BP OK today   4. CKD IV - Follows with [facility] - baseline SCr 2.2-2.5 - Labs 08/27/24 showed SCr 2.4 - Continue SGLT2i   Follow up in 3 months with APP  Harlene CHRISTELLA Gainer, FNP  11:04 AM

## 2024-09-10 ENCOUNTER — Telehealth (HOSPITAL_COMMUNITY): Payer: Self-pay

## 2024-09-10 NOTE — Telephone Encounter (Signed)
 Called to confirm/remind patient of their appointment at the [facility] on 09/13/24.   Appointment:   [] Confirmed  [] Left mess   [x] No answer/No voice mail  [] VM Full/unable to leave message  [] Phone not in service

## 2024-09-10 NOTE — Telephone Encounter (Signed)
 Per allergist appointment on 11/5 they will attempt to pursue coverage of Tezspire because doses are less frequent. Will close this encounter.

## 2024-09-11 ENCOUNTER — Other Ambulatory Visit: Payer: Self-pay | Admitting: Cardiology

## 2024-09-11 DIAGNOSIS — I480 Paroxysmal atrial fibrillation: Secondary | ICD-10-CM

## 2024-09-13 ENCOUNTER — Encounter (HOSPITAL_COMMUNITY): Payer: Self-pay

## 2024-09-13 ENCOUNTER — Ambulatory Visit (HOSPITAL_COMMUNITY)
Admission: RE | Admit: 2024-09-13 | Discharge: 2024-09-13 | Disposition: A | Discharge status: Home / Self Care | Source: Ambulatory Visit | Attending: Family Medicine | Admitting: Family Medicine

## 2024-09-13 VITALS — BP 130/84 | HR 79 | Wt 157.4 lb

## 2024-09-13 DIAGNOSIS — Z79899 Other long term (current) drug therapy: Secondary | ICD-10-CM | POA: Insufficient documentation

## 2024-09-13 DIAGNOSIS — I480 Paroxysmal atrial fibrillation: Secondary | ICD-10-CM | POA: Diagnosis not present

## 2024-09-13 DIAGNOSIS — Z7901 Long term (current) use of anticoagulants: Secondary | ICD-10-CM | POA: Diagnosis not present

## 2024-09-13 DIAGNOSIS — N184 Chronic kidney disease, stage 4 (severe): Secondary | ICD-10-CM | POA: Diagnosis not present

## 2024-09-13 DIAGNOSIS — R0602 Shortness of breath: Secondary | ICD-10-CM | POA: Diagnosis present

## 2024-09-13 DIAGNOSIS — I5022 Chronic systolic (congestive) heart failure: Secondary | ICD-10-CM | POA: Insufficient documentation

## 2024-09-13 DIAGNOSIS — I428 Other cardiomyopathies: Secondary | ICD-10-CM | POA: Diagnosis present

## 2024-09-13 DIAGNOSIS — I4891 Unspecified atrial fibrillation: Secondary | ICD-10-CM | POA: Insufficient documentation

## 2024-09-13 DIAGNOSIS — I10 Essential (primary) hypertension: Secondary | ICD-10-CM

## 2024-09-13 DIAGNOSIS — E785 Hyperlipidemia, unspecified: Secondary | ICD-10-CM | POA: Insufficient documentation

## 2024-09-13 DIAGNOSIS — Z8673 Personal history of transient ischemic attack (TIA), and cerebral infarction without residual deficits: Secondary | ICD-10-CM | POA: Insufficient documentation

## 2024-09-13 DIAGNOSIS — Z95810 Presence of automatic (implantable) cardiac defibrillator: Secondary | ICD-10-CM | POA: Diagnosis not present

## 2024-09-13 DIAGNOSIS — Z7984 Long term (current) use of oral hypoglycemic drugs: Secondary | ICD-10-CM | POA: Diagnosis not present

## 2024-09-13 DIAGNOSIS — I130 Hypertensive heart and chronic kidney disease with heart failure and stage 1 through stage 4 chronic kidney disease, or unspecified chronic kidney disease: Secondary | ICD-10-CM | POA: Diagnosis present

## 2024-09-13 LAB — BASIC METABOLIC PANEL WITH GFR
Anion gap: 14 (ref 5–15)
BUN: 56 mg/dL — ABNORMAL HIGH (ref 6–20)
CO2: 18 mmol/L — ABNORMAL LOW (ref 22–32)
Calcium: 9.2 mg/dL (ref 8.9–10.3)
Chloride: 109 mmol/L (ref 98–111)
Creatinine, Ser: 2.37 mg/dL — ABNORMAL HIGH (ref 0.44–1.00)
GFR, Estimated: 23 mL/min — ABNORMAL LOW (ref >60)
Glucose, Bld: 85 mg/dL (ref 70–99)
Potassium: 4.2 mmol/L (ref 3.5–5.1)
Sodium: 141 mmol/L (ref 135–145)

## 2024-09-13 LAB — BRAIN NATRIURETIC PEPTIDE: B Natriuretic Peptide: 205.8 pg/mL — ABNORMAL HIGH (ref 0.0–100.0)

## 2024-09-13 NOTE — Patient Instructions (Addendum)
 Thank you for coming in today  If you had labs drawn today, any labs that are abnormal the clinic will call you No news is good news  Medications: No changes  Follow up appointments:  Your physician recommends that you schedule a follow-up appointment in:  3 months in clinic   Do the following things EVERYDAY: Weigh yourself in the morning before breakfast. Write it down and keep it in a log. Take your medicines as prescribed Eat low salt foods--Limit salt (sodium) to 2000 mg per day.  Stay as active as you can everyday Limit all fluids for the day to less than 2 liters   At the [facility], you and your health needs are our priority. As part of our continuing mission to provide you with exceptional heart care, we have created designated Provider Care Teams. These Care Teams include your primary Cardiologist (physician) and Advanced Practice Providers (APPs- Physician Assistants and Nurse Practitioners) who all work together to provide you with the care you need, when you need it.   You may see any of the following providers on your designated Care Team at your next follow up: Dr Arvilla Meres Dr Marca Ancona Dr. Marcos Eke, NP Robbie Lis, [place] [facility] [place], [place] Brynda Peon, NP Karle Plumber, PharmD   Please be sure to bring in all your medications bottles to every appointment.    Thank you for choosing [facility]  If you have any questions or concerns before your next appointment please send [place] a message through [place] or call our office at [phone].    TO LEAVE A MESSAGE FOR THE NURSE SELECT OPTION 2, PLEASE LEAVE A MESSAGE INCLUDING: YOUR NAME DATE OF BIRTH CALL BACK NUMBER REASON FOR CALL**this is important as we prioritize the call backs  YOU WILL RECEIVE A CALL BACK THE SAME DAY AS LONG AS YOU CALL BEFORE 4:00 PM

## 2024-09-13 NOTE — Telephone Encounter (Signed)
 Eliquis  5mg  refill request received. Patient is 63 years old, weight-71.4kg, Crea-2.40 on 08/27/24, Diagnosis-Afib, and last seen by Harlene Gainer on 09/13/24. Dose is appropriate based on dosing criteria. Will send in refill to requested pharmacy.

## 2024-09-14 NOTE — Progress Notes (Unsigned)
 Skin testing note  RE: [name] MRN: [id number] DOB: 1960/12/06 Date of Office Visit: 09/15/2024  Referring provider: Larnell Hamilton, MD Primary care provider: Larnell Hamilton, MD  Chief Complaint: skin testing  History of Present Illness: I had the pleasure of seeing [name] for a skin testing visit at the [facility] on 09/15/2024. She is a 63 y.o. female, who is being followed for nasal polyps with chronic allergic rhinitis and asthma. Her previous allergy office visit was on 09/08/2024 with Dr. Luke. Today is a skin testing visit.   Discussed the use of AI scribe software for clinical note transcription with the patient, who gave verbal consent to proceed.    She experiences nasal congestion and drainage due to nasal polyps. Nasal sprays provide some relief, but antihistamines have been ineffective.  Allergy testing was negative.  She has not yet been contacted regarding Tezspire or Dupixent injections.     Assessment and Plan: [name] is a 63 y.o. female with: Nasal polyps with chronic rhinitis Past history - chronic nasal congestion and anosmia with confirmed nasal polyps. Symptoms recur after prednisone . Patient prefers non-surgical options and less frequent dosing as she plans on coming into office for biologic administration.  09/15/2024 skin testing negative to indoor/outdoor allergies.  Stop antihistamines as ineffective. Use azelastine nasal spray 1-2 sprays per nostril twice a day as needed for runny nose/drainage. Use Flonase  (fluticasone ) nasal spray 1-2 sprays per nostril once a day as needed for nasal congestion.  Nasal saline spray (i.e., Simply Saline) or nasal saline lavage (i.e., NeilMed) is recommended as needed and prior to medicated nasal sprays. Will try to get Tezspire approved for this. This may also help with asthma.  This is an injection every 4 weeks. Dupixent is every 2 weeks.   Severe persistent asthma Past history - Managed  by pulmonology. 2025 spirometry normal.  Daily controller medication(s): continue Trelegy 200mcg 1 puff once a day and rinse mouth after each use.  May use albuterol  rescue inhaler 2 puffs or nebulizer every 4 to 6 hours as needed for shortness of breath, chest tightness, coughing, and wheezing. May use albuterol  rescue inhaler 2 puffs 5 to 15 minutes prior to strenuous physical activities. Monitor frequency of use - if you need to use it more than twice per week on a consistent basis let us  know.   Return in about 4 months (around 01/13/2025).  No orders of the defined types were placed in this encounter.  Lab Orders  No laboratory test(s) ordered today    Diagnostics: Skin Testing: Environmental allergy panel. 09/15/2024 skin testing negative to indoor/outdoor allergies.  Results discussed with patient/family.  Airborne Adult Perc - 09/15/24 1347     Time Antigen Placed 1348    Allergen Manufacturer Jestine    Location Back    Number of Test 55    2. Control-Histamine 3+    3. Bahia Negative    4. Bermuda Negative    5. Johnson Negative    6. [place]  Blue Negative    7. Meadow Fescue Negative    8. Perennial Rye Negative    9. Timothy Negative    10. Ragweed Mix Negative    11. Cocklebur Negative    12. Plantain,  English Negative    13. Baccharis Negative    14. Dog Fennel Negative    15. Russian Thistle Negative    16. Lamb's Quarters Negative    17. Sheep Sorrell Negative  18. Rough Pigweed Negative    19. Marsh Elder, Rough Negative    20. Mugwort, Common Negative    21. Box, Elder Negative    22. Cedar, red Negative    23. Sweet Gum Negative    24. Pecan Pollen Negative    25. Pine Mix Negative    26. Walnut, Black Pollen Negative    27. Red Mulberry Negative    28. Ash Mix Negative    29. Birch Mix Negative    30. Beech American Negative    31. Cottonwood, Eastern Negative    32. Hickory, White Negative    33. Maple Mix Negative    34. Oak, Eastern Mix  Negative    35. Sycamore Eastern Negative    36. Alternaria Alternata Negative    37. Cladosporium Herbarum Negative    38. Aspergillus Mix Negative    39. Penicillium Mix Negative    40. Bipolaris Sorokiniana (Helminthosporium) Negative    41. Drechslera Spicifera (Curvularia) Negative    42. Mucor Plumbeus Negative    43. Fusarium Moniliforme Negative    44. Aureobasidium Pullulans (pullulara) Negative    45. Rhizopus Oryzae Negative    46. Botrytis Cinera Negative    47. Epicoccum Nigrum Negative    48. Phoma Betae Negative    49. Dust Mite Mix Negative    50. Cat Hair 10,000 BAU/ml Negative    51.  Dog Epithelia Negative    52. Mixed Feathers Negative    53. Horse Epithelia Negative    54. Cockroach, German Negative    55. Tobacco Leaf Negative          Intradermal - 09/15/24 1410     Number of Test 16    Control Negative    Bahia Negative    Bermuda Negative    Johnson Negative    7 Grass Negative    Ragweed Mix Negative    Weed Mix Negative    Tree Mix Negative    Mold 1 Negative    Mold 2 Negative    Mold 3 Negative    Mold 4 Negative    Mite Mix Negative    Cat Negative    Dog Negative    Cockroach Negative          Previous notes and tests were reviewed. The plan was reviewed with the patient/family, and all questions/concerned were addressed.  It was my pleasure to see [name] today and participate in her care. Please feel free to contact me with any questions or concerns.  Sincerely,  Orlan Cramp, DO Allergy & Immunology  [facility]  [place] office: [phone] [facility] office: [phone]

## 2024-09-15 ENCOUNTER — Ambulatory Visit (INDEPENDENT_AMBULATORY_CARE_PROVIDER_SITE_OTHER): Admitting: Allergy

## 2024-09-15 ENCOUNTER — Ambulatory Visit (HOSPITAL_COMMUNITY): Payer: Self-pay | Admitting: Family Medicine

## 2024-09-15 ENCOUNTER — Encounter: Payer: Self-pay | Admitting: Allergy

## 2024-09-15 DIAGNOSIS — J339 Nasal polyp, unspecified: Secondary | ICD-10-CM

## 2024-09-15 DIAGNOSIS — J3089 Other allergic rhinitis: Secondary | ICD-10-CM

## 2024-09-15 DIAGNOSIS — J310 Chronic rhinitis: Secondary | ICD-10-CM

## 2024-09-15 DIAGNOSIS — J4550 Severe persistent asthma, uncomplicated: Secondary | ICD-10-CM

## 2024-09-15 NOTE — Patient Instructions (Addendum)
 09/15/2024 skin testing negative to indoor/outdoor allergies.   Results given.  Stop antihistamines as ineffective.  Use azelastine nasal spray 1-2 sprays per nostril twice a day as needed for runny nose/drainage. Use Flonase  (fluticasone ) nasal spray 1-2 sprays per nostril once a day as needed for nasal congestion.  Nasal saline spray (i.e., Simply Saline) or nasal saline lavage (i.e., NeilMed) is recommended as needed and prior to medicated nasal sprays.  Nasal polyps Will try to get Tezspire approved for this. This may also help with asthma.  This is an injection every 4 weeks. Dupixent is every 2 weeks.  Asthma Managed by pulmonology. Daily controller medication(s): continue Trelegy 200mcg 1 puff once a day and rinse mouth after each use.  May use albuterol  rescue inhaler 2 puffs or nebulizer every 4 to 6 hours as needed for shortness of breath, chest tightness, coughing, and wheezing. May use albuterol  rescue inhaler 2 puffs 5 to 15 minutes prior to strenuous physical activities. Monitor frequency of use - if you need to use it more than twice per week on a consistent basis let us  know.  Breathing control goals:  Full participation in all desired activities (may need albuterol  before activity) Albuterol  use two times or less a week on average (not counting use with activity) Cough interfering with sleep two times or less a month Oral steroids no more than once a year No hospitalizations   Follow up in 4 months or sooner if needed.

## 2024-09-21 ENCOUNTER — Telehealth: Payer: Self-pay | Admitting: *Deleted

## 2024-09-21 NOTE — Telephone Encounter (Signed)
 L/M for patient to contact me . I contacted her Ins due to inability to do approval online and was advised patient no longer has active policy

## 2024-09-21 NOTE — Telephone Encounter (Signed)
-----   Message from Orlan CHRISTELLA Cramp sent at 09/08/2024  5:38 PM EST ----- Davinia Riccardi, looks like pulm submitted some initial paperwork to get dupixent approval for nasal polyps but patient prefers tezspire due to less frequent dosing if it's covered. Can you check if [place] is covered for her for nasal polyps + asthma? Ty.

## 2024-09-23 ENCOUNTER — Encounter: Payer: Self-pay | Admitting: Internal Medicine

## 2024-09-23 NOTE — Telephone Encounter (Signed)
 Spoke to patient and advised Ins stating not active at this time

## 2024-10-03 NOTE — Progress Notes (Unsigned)
 Cardiology Office Note:    Date:  10/04/2024   ID:  [name], DOB 1961/04/20, MRN [id number]  PCP:  Larnell Hamilton, MD   [facility] Providers Cardiologist:  Lynwood Schilling, MD Electrophysiologist:  Danelle Birmingham, MD     Referring MD: Larnell Hamilton, MD   Chief complaint: 4 month follow up     History of Present Illness:   [name] is a 63 y.o. female with a hx of HTN, NICM, CVA, atrial fib, CKD stage IV, hyperlipidemia, Medtronic BIV ICD (CRT-D), presents to office today for follow up of her chronic cardiac conditions.   Echo 08/2014: LVEF 25-30%  Echo 11/2014: LVEF 40-45% and grade 1 DD Echo 12/2016: LVEF 50-55% and grade 1 DD Echo 06/2018: LVEF 50-55%   Following CVA in February 2023, Cardiac MRI without contrast 12/17/2021: Moderate LV dilation with diffuse severe LV hypokinesis and prominent septal-lateral dyssynchrony consistent with LBBB.  EF 20%, no LV thrombus/no source of clot, no evidence for large prior MI, myocarditis, or infiltrative disease. Stopped entresto  and Farxiga due to worsening kidney function, started on hydralazine  and nitrites. Negative for sleep apnea.   Echocardiogram on 11/14/2022 indicated LVEF of 20 to 25%, LV demonstrating global hypokinesis, LV internal cavity size was severely dilated, G2 DD. Given persistent reduction in EF and LBBB, she was referred to Dr. Birmingham. 01/03/2023 had Medtronic BIV ICD (CRT-D) inserted. Shortly after insertion, she was alerted to episodes of atrial fibrillation, was started on 5 mg Eliquis  BID and referred to A. Fib clinic.   Admitted to the hospital from 3/14-3/18/2025 for acute on chronic combined congestive HF. Echo 01/17/2024: LVEF 20-25%, severely decreased LV function, moderate asymmetric LV hypertrophy of inferior segment, G1 DD, normal RV, aortic valve not well-visualized.  Cardiology was consulted for concern for prolonged episodes of NSVT on telemetry, on review felt longest episode to be AT rather  than NSVT that was supported by ICD interrogation.    The day after her TOC appointment 2 weeks following D/C from hospital had sudden onset SOB, O2 sat 83% on RA when EMS arrived. Readmitted to hospital from 01/31/2024-02/06/2024 for stabilization and diuresis.    04/25/2024 arrived to the ED for cough and SOB. CXR showed cardiomegaly with central pulmonary vascular congestion. K was 3.2. BNP was elevated 436. Patient was offered admission for diuresis but declined. Treated with DuoNeb, IV Lasix  20 mg and KCL 40 MEQ.    05/17/2024 arrived to the ED due to worsening cough and shortness of breath, Was requesting IV lasix  and nebulizer treatment. Chest X-ray showed no acute process. EKG at that time sinus rhythm with occasional PVC's, LBBB. BNP 496. Troponins were 32>> 38. Admitted overnight, strict I/O's, with IV diuresis And breathing treatments. Discharged next day, follow up with cardiology and pulmonology.   05/25/2024: Cardiology OV found to be euvolemic on exam, BP log ordered, meds continued, zetia  ordered, referred to nephrology for CKD IV.    06/08/2024: Pulmonology OV dx with asthma, prescribed trelegy ellipta , albuterol , and airsupra rescue inhaler, ordered PFT's, scheduled for 08/2024.  Last seen by myself on 07/06/2024, was stable and euvolemic from a cardiovascular standpoint at that time.  Was due to follow-up with nephrology and advanced heart failure clinic.  BiDil  was increased from 20-30 7.5-40-75 mg 3 times daily.  Seen by Dr. Cherrie in the advanced heart failure clinic on 08/19/2024.  He added Spironolactone  12.5 daily.  Most recently evaluated by the advanced heart failure clinic on 09/13/2024, BP had  improved, weight stable, potassium stable at 4.2 on 09/13/2024.  Presents independently, appears stable from a cardiovascular standpoint.  Her primary complaint today revolves around her continued nasal congestion that she is currently working with her allergist on getting insurance  approval for Dupixent injection. She denies chest pain, palpitations, dyspnea, orthopnea, n, v, dark/tarry/bloody stools, hematuria, dizziness, syncope, edema, weight gain.   Reports occasional mild lower extremity edema when on her feet for long periods of time.  Resolves with elevation of lower extremities.   ROS:   Please see the history of present illness.     All other systems reviewed and are negative.     Past Medical History:  Diagnosis Date   CHF (congestive heart failure) (HCC)    Hypertension    Overweight(278.02)    Sciatica    Secondary cardiomyopathy, unspecified    EF now 50%    Past Surgical History:  Procedure Laterality Date   ABDOMINAL HYSTERECTOMY     BIV ICD INSERTION CRT-D N/A 01/03/2023   Procedure: BIV ICD INSERTION CRT-D;  Surgeon: Waddell Danelle ORN, MD;  Location: [facility] INVASIVE CV LAB;  Service: Cardiovascular;  Laterality: N/A;    Current Medications: Current Meds  Medication Sig   Acetaminophen  (TYLENOL  PO) Take 1,000 mg by mouth daily as needed (for pain).   albuterol  (ACCUNEB ) 0.63 MG/3ML nebulizer solution Take 3 mLs (0.63 mg total) by nebulization every 6 (six) hours as needed for wheezing or shortness of breath.   albuterol  (VENTOLIN  HFA) 108 (90 Base) MCG/ACT inhaler Inhale 1 puff into the lungs every 6 (six) hours as needed.   apixaban  (ELIQUIS ) 5 MG TABS tablet TAKE 1 TABLET(5 MG) BY MOUTH TWICE DAILY   atorvastatin  (LIPITOR ) 80 MG tablet Take 1 tablet (80 mg total) by mouth daily.   azelastine  (ASTELIN ) 0.1 % nasal spray Place 1-2 sprays into both nostrils 2 (two) times daily as needed (nasal drainage). Use in each nostril as directed   carvedilol  (COREG ) 25 MG tablet TAKE 1 TABLET(25 MG) BY MOUTH TWICE DAILY WITH A MEAL   cetirizine (ZYRTEC) 10 MG tablet Take 10 mg by mouth daily.   cholecalciferol (VITAMIN D3) 25 MCG (1000 UNIT) tablet Take 1,000 Units by mouth daily.   empagliflozin  (JARDIANCE ) 10 MG TABS tablet Take 1 tablet (10 mg total) by  mouth daily before breakfast.   ezetimibe  (ZETIA ) 10 MG tablet Take 1 tablet (10 mg total) by mouth daily.   fluticasone  (FLONASE ) 50 MCG/ACT nasal spray Place 2 sprays into both nostrils daily.   Fluticasone -Umeclidin-Vilant (TRELEGY ELLIPTA ) 200-62.5-25 MCG/ACT AEPB Inhale 1 puff into the lungs daily.   furosemide  (LASIX ) 20 MG tablet Take 2 tablets (40 mg total) by mouth daily. Take in case of weight gain 2 to 3 lbs in 24 hrs or 5 lbs in 7 days until weight back to baseline   HYDROcodone -acetaminophen  (NORCO/VICODIN) 5-325 MG tablet Take 1 tablet by mouth daily as needed for severe pain (pain score 7-10).   isosorbide -hydrALAZINE  (BIDIL ) 20-37.5 MG tablet Take 2 tablets three times daily. Please stop if blood pressure drops too low   Multiple Vitamin (MULTIVITAMIN) tablet Take 1 tablet by mouth daily.   spironolactone  (ALDACTONE ) 25 MG tablet Take 0.5 tablets (12.5 mg total) by mouth daily.   zolpidem  (AMBIEN ) 5 MG tablet Take 5 mg by mouth at bedtime as needed for sleep.     Allergies:   Chlorhexidine  gluconate and Latex   Social History   Socioeconomic History   Marital status: Single  Spouse name: Not on file   Number of children: 3   Years of education: Not on file   Highest education level: High school graduate  Occupational History   Not on file  Tobacco Use   Smoking status: Never   Smokeless tobacco: Never   Tobacco comments:    Never smoke 03/07/23  Vaping Use   Vaping status: Never Used  Substance and Sexual Activity   Alcohol use: Yes    Alcohol/week: 7.0 - 14.0 standard drinks of alcohol    Types: 7 - 14 Cans of beer per week    Comment: 1-2 beers daily 03/07/23   Drug use: No   Sexual activity: Yes    Birth control/protection: Surgical  Other Topics Concern   Not on file  Social History Narrative   Not on file   Social Drivers of Health   Financial Resource Strain: Low Risk  (01/19/2024)   Overall Financial Resource Strain (CARDIA)    Difficulty of  Paying Living Expenses: Not hard at all  Food Insecurity: No Food Insecurity (01/31/2024)   Hunger Vital Sign    Worried About Running Out of Food in the Last Year: Never true    Ran Out of Food in the Last Year: Never true  Transportation Needs: No Transportation Needs (01/31/2024)   PRAPARE - Administrator, Civil Service (Medical): No    Lack of Transportation (Non-Medical): No  Physical Activity: Not on file  Stress: Not on file  Social Connections: Not on file     Family History: The patient's family history includes Cerebral aneurysm (age of onset: 17) in her mother; Coronary artery disease in an other family member; Heart attack (age of onset: 36) in her father; Hypertension in her brother and mother. There is no history of Colon cancer or Breast cancer.  EKGs/Labs/Other Studies Reviewed:    The following studies were reviewed today:      Recent Labs: 04/25/2024: ALT 11; Magnesium  1.7 05/18/2024: Hemoglobin 9.7; Platelets 210 09/13/2024: B Natriuretic Peptide 205.8; BUN 56; Creatinine, Ser 2.37; Potassium 4.2; Sodium 141  Recent Lipid Panel    Component Value Date/Time   CHOL 181 03/22/2024 1024   TRIG 90 03/22/2024 1024   HDL 56 03/22/2024 1024   CHOLHDL 3.2 03/22/2024 1024   CHOLHDL 2.9 12/14/2021 0354   VLDL 11 12/14/2021 0354   LDLCALC 109 (H) 03/22/2024 1024   LDLDIRECT 77 07/06/2024 1623     Risk Assessment/Calculations:    CHA2DS2-VASc Score = 3   This indicates a 3.2% annual risk of stroke. The patient's score is based upon: CHF History: 1 HTN History: 1 Diabetes History: 0 Stroke History: 0 Vascular Disease History: 0 Age Score: 0 Gender Score: 1               Physical Exam:    VS:  BP 120/72 (BP Location: Left Arm)   Pulse 72   Ht 5' 4 (1.626 m)   Wt 154 lb 6.4 oz (70 kg)   SpO2 94%   BMI 26.50 kg/m        Wt Readings from Last 3 Encounters:  10/04/24 154 lb 6.4 oz (70 kg)  09/13/24 157 lb 6.4 oz (71.4 kg)  09/08/24 154  lb 1.6 oz (69.9 kg)     GEN:  Well nourished, well developed in no acute distress HEENT: Normal NECK:  No carotid bruits CARDIAC:  S1-S2 normal, RRR, no murmurs, rubs, gallops RESPIRATORY:  Clear to auscultation without rales,  wheezing or rhonchi  MUSCULOSKELETAL: Trace lower extremity edema bilaterally; No deformity  SKIN: Warm and dry NEUROLOGIC:  Alert and oriented x 3 PSYCHIATRIC:  Normal affect       Assessment & Plan Chronic combined systolic and diastolic congestive heart failure (HCC) NICM- EF 25-30% echo 08/05/14 Biventricular ICD (implantable cardioverter-defibrillator) in place -Historical LVEFs of 20-25% since 2023 -Echo 01/17/2024: LVEF 20-25%, severely decreased LV function, moderate asymmetric LV hypertrophy of inferior segment, G1 DD, normal RV, aortic valve not well-visualized. -cMRI 12/2021 without evidence of prior MI or amyloid  -Had BIV ICD (CRT-D) placed with Dr. Waddell on 01/03/2023  -Recently evaluated over the last couple months with the advanced heart failure team who started patient on low dose spironolactone  with improvements in potassium and BPs. - ICD impedence showed volume overload for a few months throughout the middle of the year, steeply declining in early September. -Appears euvolemic on exam and tolerating medications -Weight has been stable at home around 153 lbs - Continue Lasix  40 mg daily - Continue spiro 12.5 mg daily. - Continue carvedilol  25 mg bid - Continue Jardiance  10 mg daily - Reports the Bidil  increase dropped her BP too much at previous visit. Has been taking two in a.m., and one midday, one in p.m., with good control of BP. Continue as you have been taking. No med changes at this time PAF (paroxysmal atrial fibrillation) [facility]) Device check 07/08/2024: AS-VP No arrhythmias noted on recent pacer check. - Denies palpitations, lightheadedness, near syncope -Continue Eliquis  5 mg BID as this is the correct dose for her age, weight, and  creatinine - CHA2DS2VASc score = 3 -Continue Coreg  25 mg BID Essential hypertension Patient reports her BPs were too low on increased BiDil  dose Instead of 2 tablets 3 times daily, patient has been taking 2 tablets in a.m., 1 tab in afternoon, 1 tab in p.m.  Patient may continue this dosing of the medication Continue Lasix  40 mg daily Continue spironolactone  12.5 mg daily Continue carvedilol  25 mg twice daily Continue Jardiance  10 mg daily Dyslipidemia -04/2024: LDL 85 -Continue atorvastatin  80 mg daily -Zetia  10 mg added on 05/25/2024 Was referred to advanced lipid clinic at previous visit, has upcoming appointment in 2 weeks. Stage 3b chronic kidney disease (HCC) - Was seen by nephrology on 07/14/2024 - Will need to request access to those records today  Follow-up with lipid clinic in 2 weeks to discuss PCSK9 injectable Follow-up with advanced heart failure team as scheduled in February Follow-up with general cardiology in 6 months or sooner if needed Proceed to the emergency department with any new, worsening, or concerning symptoms           Medication Adjustments/Labs and Tests Ordered: Current medicines are reviewed at length with the patient today.  Concerns regarding medicines are outlined above.  No orders of the defined types were placed in this encounter.  No orders of the defined types were placed in this encounter.   There are no Patient Instructions on file for this visit.   Signed, Miriam FORBES Shams, NP  10/04/2024 3:47 PM    [facility]

## 2024-10-04 ENCOUNTER — Ambulatory Visit: Attending: Physician Assistant | Admitting: Emergency Medicine

## 2024-10-04 ENCOUNTER — Encounter: Payer: Self-pay | Admitting: Physician Assistant

## 2024-10-04 VITALS — BP 120/72 | HR 72 | Ht 64.0 in | Wt 154.4 lb

## 2024-10-04 DIAGNOSIS — I480 Paroxysmal atrial fibrillation: Secondary | ICD-10-CM | POA: Diagnosis not present

## 2024-10-04 DIAGNOSIS — Z95810 Presence of automatic (implantable) cardiac defibrillator: Secondary | ICD-10-CM

## 2024-10-04 DIAGNOSIS — I10 Essential (primary) hypertension: Secondary | ICD-10-CM

## 2024-10-04 DIAGNOSIS — E785 Hyperlipidemia, unspecified: Secondary | ICD-10-CM

## 2024-10-04 DIAGNOSIS — N1832 Chronic kidney disease, stage 3b: Secondary | ICD-10-CM

## 2024-10-04 DIAGNOSIS — I5042 Chronic combined systolic (congestive) and diastolic (congestive) heart failure: Secondary | ICD-10-CM | POA: Diagnosis not present

## 2024-10-04 DIAGNOSIS — I428 Other cardiomyopathies: Secondary | ICD-10-CM

## 2024-10-04 NOTE — Assessment & Plan Note (Signed)
 Device check 07/08/2024: AS-VP No arrhythmias noted on recent pacer check. - Denies palpitations, lightheadedness, near syncope -Continue Eliquis  5 mg BID as this is the correct dose for her age, weight, and creatinine - CHA2DS2VASc score = 3 -Continue Coreg  25 mg BID

## 2024-10-04 NOTE — Assessment & Plan Note (Addendum)
-  Historical LVEFs of 20-25% since 2023 -Echo 01/17/2024: LVEF 20-25%, severely decreased LV function, moderate asymmetric LV hypertrophy of inferior segment, G1 DD, normal RV, aortic valve not well-visualized. -cMRI 12/2021 without evidence of prior MI or amyloid  -Had BIV ICD (CRT-D) placed with Dr. Waddell on 01/03/2023  -Recently evaluated over the last couple months with the advanced heart failure team who started patient on low dose spironolactone  with improvements in potassium and BPs. - ICD impedence showed volume overload for a few months throughout the middle of the year, steeply declining in early September. -Appears euvolemic on exam and tolerating medications -Weight has been stable at home around 153 lbs - Continue Lasix  40 mg daily - Continue spiro 12.5 mg daily. - Continue carvedilol  25 mg bid - Continue Jardiance  10 mg daily - Reports the Bidil  increase dropped her BP too much at previous visit. Has been taking two in a.m., and one midday, one in p.m., with good control of BP. Continue as you have been taking. No med changes at this time

## 2024-10-04 NOTE — Assessment & Plan Note (Signed)
-   Was seen by nephrology on 07/14/2024 - Will need to request access to those records today

## 2024-10-04 NOTE — Assessment & Plan Note (Signed)
 Patient reports her BPs were too low on increased BiDil  dose Instead of 2 tablets 3 times daily, patient has been taking 2 tablets in a.m., 1 tab in afternoon, 1 tab in p.m.  Patient may continue this dosing of the medication Continue Lasix  40 mg daily Continue spironolactone  12.5 mg daily Continue carvedilol  25 mg twice daily Continue Jardiance  10 mg daily

## 2024-10-04 NOTE — Patient Instructions (Signed)
 Medication Instructions:  NO CHANGES *If you need a refill on your cardiac medications before your next appointment, please call your pharmacy*  Lab Work: NO LABS If you have labs (blood work) drawn today and your tests are completely normal, you will receive your results only by: MyChart Message (if you have MyChart) OR A paper copy in the mail If you have any lab test that is abnormal or we need to change your treatment, we will call you to review the results.  Testing/Procedures: NO TESTING  Follow-Up: At [facility], you and your health needs are our priority.  As part of our continuing mission to provide you with exceptional heart care, our providers are all part of one team.  This team includes your primary Cardiologist (physician) and Advanced Practice Providers or APPs (Physician Assistants and Nurse Practitioners) who all work together to provide you with the care you need, when you need it.  Your next appointment:   6 month(s)  Provider:   Eilleen Grates, MD

## 2024-10-05 ENCOUNTER — Ambulatory Visit: Payer: BC Managed Care – PPO

## 2024-10-05 DIAGNOSIS — I480 Paroxysmal atrial fibrillation: Secondary | ICD-10-CM | POA: Diagnosis not present

## 2024-10-06 LAB — CUP PACEART REMOTE DEVICE CHECK
Battery Remaining Longevity: 90 mo
Battery Voltage: 2.98 V
Brady Statistic AP VP Percent: 0.3 %
Brady Statistic AP VS Percent: 0.03 %
Brady Statistic AS VP Percent: 95.55 %
Brady Statistic AS VS Percent: 4.13 %
Brady Statistic RA Percent Paced: 0.44 %
Brady Statistic RV Percent Paced: 0.84 %
Date Time Interrogation Session: 20251201192355
HighPow Impedance: 61 Ohm
Implantable Lead Connection Status: 753985
Implantable Lead Connection Status: 753985
Implantable Lead Connection Status: 753985
Implantable Lead Implant Date: 20240301
Implantable Lead Implant Date: 20240301
Implantable Lead Implant Date: 20240301
Implantable Lead Location: 753858
Implantable Lead Location: 753859
Implantable Lead Location: 753860
Implantable Lead Model: 4598
Implantable Lead Model: 5076
Implantable Lead Model: 6935
Implantable Pulse Generator Implant Date: 20240301
Lead Channel Impedance Value: 304 Ohm
Lead Channel Impedance Value: 304 Ohm
Lead Channel Impedance Value: 342 Ohm
Lead Channel Impedance Value: 361 Ohm
Lead Channel Impedance Value: 361 Ohm
Lead Channel Impedance Value: 380 Ohm
Lead Channel Impedance Value: 456 Ohm
Lead Channel Impedance Value: 475 Ohm
Lead Channel Impedance Value: 608 Ohm
Lead Channel Impedance Value: 627 Ohm
Lead Channel Impedance Value: 646 Ohm
Lead Channel Impedance Value: 665 Ohm
Lead Channel Impedance Value: 665 Ohm
Lead Channel Pacing Threshold Amplitude: 0.625 V
Lead Channel Pacing Threshold Amplitude: 1.125 V
Lead Channel Pacing Threshold Amplitude: 1.125 V
Lead Channel Pacing Threshold Pulse Width: 0.4 ms
Lead Channel Pacing Threshold Pulse Width: 0.4 ms
Lead Channel Pacing Threshold Pulse Width: 0.4 ms
Lead Channel Sensing Intrinsic Amplitude: 18.4 mV
Lead Channel Sensing Intrinsic Amplitude: 3.8 mV
Lead Channel Setting Pacing Amplitude: 1.5 V
Lead Channel Setting Pacing Amplitude: 2 V
Lead Channel Setting Pacing Amplitude: 2.25 V
Lead Channel Setting Pacing Pulse Width: 0.4 ms
Lead Channel Setting Pacing Pulse Width: 0.4 ms
Lead Channel Setting Sensing Sensitivity: 0.3 mV
Zone Setting Status: 755011
Zone Setting Status: 755011

## 2024-10-08 ENCOUNTER — Ambulatory Visit: Payer: Self-pay | Admitting: Internal Medicine

## 2024-10-08 NOTE — Progress Notes (Signed)
 Remote ICD Transmission

## 2024-10-13 DIAGNOSIS — N2581 Secondary hyperparathyroidism of renal origin: Secondary | ICD-10-CM | POA: Diagnosis not present

## 2024-10-13 DIAGNOSIS — N184 Chronic kidney disease, stage 4 (severe): Secondary | ICD-10-CM | POA: Diagnosis not present

## 2024-10-13 DIAGNOSIS — D631 Anemia in chronic kidney disease: Secondary | ICD-10-CM | POA: Diagnosis not present

## 2024-10-13 DIAGNOSIS — I129 Hypertensive chronic kidney disease with stage 1 through stage 4 chronic kidney disease, or unspecified chronic kidney disease: Secondary | ICD-10-CM | POA: Diagnosis not present

## 2024-10-18 ENCOUNTER — Ambulatory Visit: Admitting: Pharmacist

## 2024-10-18 DIAGNOSIS — E785 Hyperlipidemia, unspecified: Secondary | ICD-10-CM | POA: Diagnosis not present

## 2024-10-18 NOTE — Assessment & Plan Note (Signed)
 Assessment: - LDL of 77 mg/dL on atorvastatin  80 mg and ezetimibe  10 mg remains above goal of < 70 mg/dL - Currently not very active outside of walking required at work - History of a stroke and concerned about having a second event - Diet is generally heart healthy, counseled on some changes that could be made to reduce cholesterol  Plan: - Start Repatha injections after insurance claim is approved - Stop ezetimibe  - Continue atorvastatin  80 mg daily - Increase aerobic exercise as able - Come back for cholesterol labs in 2-3 months

## 2024-10-18 NOTE — Progress Notes (Signed)
 Patient ID: [name]                 DOB: 04-21-1961                    MRN: [id number]      HPI: [name] is a 63 y.o. female patient referred to lipid clinic by [name], NP. PMH is significant for HFrEF, CRT-D, PAF on Eliquis , HTN, HLD, stage 3b CKD, stroke 2023.  Started on atorvastatin  after stroke in 2023. In May 2025, LDL-C was 83 mg/dL, above goal of < 70 mg/dL. Was subsequently started on ezetimibe  in July 2025. Repeat LDL direct 77 mg/dL on 0/05/7973. She presents to lipid clinic today to discuss lipid-lowering options.  Explained the importance of cholesterol control in preventing future strokes, heart attacks, and death from cardiovascular causes. Discussed her goal LDL of < 70 mg/dL. She explained that she would prefer to control her cholesterol through lifestyle changes if possible, but that she gets worried about eating certain foods because she's concerned about increasing her risk of stroke.   We talked about diet changes such as reducing full-fat dairy and red meat intakes and increasing fiber and whole grain intake. 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 Encouraged her to start walking at home if able. She expressed her interest and understanding.  Discussed that Repatha is an injection given once every 2 weeks at home and will significantly reduce her cholesterol and risk of stroke, heart attack. 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 She is amenable to trying Repatha at this time. We will begin the process of getting insurance coverage.   Current Medications: atorvastatin  80 mg daily, ezetimibe  10 mg daily Intolerances: n/a Risk Factors: HTN, hx of stroke LDL-C goal: < 70 mg/dL ApoB goal: < 80 mg/dL  Diet:  Breakfast: Bowl of grits and eggs or boiled eggs and grapes Turkey sausage, egg and grapes when at home Lunch: Chef salad, soup (potato,  broccoli) Dinner: If eats a late lunch, she skips dinner and snacks on fruit Baked chicken, baked salmon, hamburger, broccoli, small portion of rice  Exercise:  Not currently exercising  Family History:  Family History  Problem Relation Age of Onset   Cerebral aneurysm Mother 102       ruptured   Hypertension Mother        severe   Heart attack Father 35   Coronary artery disease Other    Hypertension Brother        severe   Colon cancer Neg Hx    Breast cancer Neg Hx     Social History:  Social Drivers of Health   Tobacco Use: Low Risk (10/04/2024)   Patient History    Smoking Tobacco Use: Never    Smokeless Tobacco Use: Never    Passive Exposure: Not on file  Financial Resource Strain: Low Risk (01/19/2024)   Overall Financial Resource Strain (CARDIA)    Difficulty of Paying Living Expenses: Not hard at all  Food Insecurity: No Food Insecurity (01/31/2024)   Hunger Vital Sign    Worried About Running Out of Food in the Last Year: Never true    Ran Out of Food in the Last Year: Never true  Transportation Needs: No Transportation Needs (01/31/2024)   PRAPARE - Administrator, Civil Service (Medical): No    Lack of Transportation (Non-Medical):  No  Physical Activity: Not on file  Stress: Not on file  Social Connections: Not on file  Depression (EYV7-0): Not on file  Alcohol Screen: Low Risk (01/19/2024)   Alcohol Screen    Last Alcohol Screening Score (AUDIT): 2  Housing: Low Risk (01/31/2024)   Housing Stability Vital Sign    Unable to Pay for Housing in the Last Year: No    Number of Times Moved in the Last Year: 0    Homeless in the Last Year: No  Utilities: Not At Risk (01/31/2024)   AHC Utilities    Threatened with loss of utilities: No  Health Literacy: Not on file    Labs: Lipid Panel     Component Value Date/Time   CHOL 181 03/22/2024 1024   TRIG 90 03/22/2024 1024   HDL 56 03/22/2024 1024   CHOLHDL 3.2 03/22/2024 1024   CHOLHDL 2.9  12/14/2021 0354   VLDL 11 12/14/2021 0354   LDLCALC 109 (H) 03/22/2024 1024   LDLDIRECT 77 07/06/2024 1623   LABVLDL 16 03/22/2024 1024    Past Medical History:  Diagnosis Date   CHF (congestive heart failure) (HCC)    Hypertension    Overweight(278.02)    Sciatica    Secondary cardiomyopathy, unspecified    EF now 50%    Medications Ordered Prior to Encounter[1]  Allergies[2]  Assessment/Plan:  1. Hyperlipidemia -  Hyperlipidemia Assessment: - LDL of 77 mg/dL on atorvastatin  80 mg and ezetimibe  10 mg remains above goal of < 70 mg/dL - Currently not very active outside of walking required at work - History of a stroke and concerned about having a second event - Diet is generally heart healthy, counseled on some changes that could be made to reduce cholesterol  Plan: - Start Repatha injections after insurance claim is approved - Stop ezetimibe  - Continue atorvastatin  80 mg daily - Increase aerobic exercise as able - Come back for cholesterol labs in 2-3 months   Thank you,  [name], PharmD PGY2 Cardiology Pharmacy Resident  [name], Pharm.JONETTA SARAN, CPP [facility] A Division of [facility] [address] [place], [place] [address]  Phone: [phone]; Fax: [phone]        [1]  Current Outpatient Medications on File Prior to Visit  Medication Sig Dispense Refill   Acetaminophen  (TYLENOL  PO) Take 1,000 mg by mouth daily as needed (for pain).     albuterol  (ACCUNEB ) 0.63 MG/3ML nebulizer solution Take 3 mLs (0.63 mg total) by nebulization every 6 (six) hours as needed for wheezing or shortness of breath. 75 mL 0   albuterol  (VENTOLIN  HFA) 108 (90 Base) MCG/ACT inhaler Inhale 1 puff into the lungs every 6 (six) hours as needed.     apixaban  (ELIQUIS ) 5 MG TABS tablet TAKE 1 TABLET(5 MG) BY MOUTH TWICE DAILY 60 tablet 10   atorvastatin  (LIPITOR ) 80 MG tablet Take 1 tablet (80 mg total) by mouth daily. 30 tablet  1   azelastine  (ASTELIN ) 0.1 % nasal spray Place 1-2 sprays into both nostrils 2 (two) times daily as needed (nasal drainage). Use in each nostril as directed 30 mL 5   carvedilol  (COREG ) 25 MG tablet TAKE 1 TABLET(25 MG) BY MOUTH TWICE DAILY WITH A MEAL 180 tablet 3   cetirizine (ZYRTEC) 10 MG tablet Take 10 mg by mouth daily.     cholecalciferol (VITAMIN D3) 25 MCG (1000 UNIT) tablet Take 1,000 Units by mouth daily.     empagliflozin  (  JARDIANCE ) 10 MG TABS tablet Take 1 tablet (10 mg total) by mouth daily before breakfast. 90 tablet 3   ezetimibe  (ZETIA ) 10 MG tablet Take 1 tablet (10 mg total) by mouth daily. 90 tablet 3   fluticasone  (FLONASE ) 50 MCG/ACT nasal spray Place 2 sprays into both nostrils daily.     Fluticasone -Umeclidin-Vilant (TRELEGY ELLIPTA ) 200-62.5-25 MCG/ACT AEPB Inhale 1 puff into the lungs daily. 60 each 11   furosemide  (LASIX ) 20 MG tablet Take 2 tablets (40 mg total) by mouth daily. Take in case of weight gain 2 to 3 lbs in 24 hrs or 5 lbs in 7 days until weight back to baseline 30 tablet 1   HYDROcodone -acetaminophen  (NORCO/VICODIN) 5-325 MG tablet Take 1 tablet by mouth daily as needed for severe pain (pain score 7-10).     isosorbide -hydrALAZINE  (BIDIL ) 20-37.5 MG tablet Take 2 tablets three times daily. Please stop if blood pressure drops too low 180 tablet 11   Multiple Vitamin (MULTIVITAMIN) tablet Take 1 tablet by mouth daily.     spironolactone  (ALDACTONE ) 25 MG tablet Take 0.5 tablets (12.5 mg total) by mouth daily. 45 tablet 3   zolpidem  (AMBIEN ) 5 MG tablet Take 5 mg by mouth at bedtime as needed for sleep.  1   No current facility-administered medications on file prior to visit.  [2]  Allergies Allergen Reactions   Chlorhexidine  Gluconate Hives    Unknown if this is the cause of the hives, raised areas around lab draw sites. No tape and area wiped with chlorhexidine .   Latex Itching

## 2024-10-18 NOTE — Patient Instructions (Signed)
 Plan to start Repatha injections once insurance has approved it. We will reach out to you once it can be filled. Once you have started Repatha you can stop taking ezetimibe . Continue taking atorvastatin . Work on increasing your exercise. Get repeat cholesterol labs in around 3 months at the [facility] in our building.

## 2024-10-19 ENCOUNTER — Other Ambulatory Visit (HOSPITAL_COMMUNITY): Payer: Self-pay

## 2024-10-19 ENCOUNTER — Telehealth: Payer: Self-pay | Admitting: Pharmacy Technician

## 2024-10-19 NOTE — Telephone Encounter (Signed)
 Pharmacy Patient Advocate Encounter   Received notification from Physician's Office that prior authorization for Repatha is required/requested.   Insurance verification completed.   The patient is insured through [employer].   Per test claim: The current 10/19/24 day co-pay is, $0.00.  No PA needed at this time. This test claim was processed through [facility]- copay amounts may vary at other pharmacies due to pharmacy/plan contracts, or as the patient moves through the different stages of their insurance plan.

## 2024-10-29 MED ORDER — REPATHA SURECLICK 140 MG/ML ~~LOC~~ SOAJ: 1.0000 mL | SUBCUTANEOUS | 3 refills | Status: AC

## 2024-10-29 NOTE — Telephone Encounter (Signed)
 Pt returning call. Please advise.

## 2024-10-29 NOTE — Telephone Encounter (Signed)
 Spoke with patient. Reviewed that Repatha  was approved. She would like rx at [employer]. Will stop zetia . Repeat labs in 3 months.

## 2024-10-29 NOTE — Addendum Note (Signed)
 Addended by: Estefanie Cornforth D on: 10/29/2024 03:09 PM   Modules accepted: Orders

## 2024-11-02 ENCOUNTER — Telehealth: Payer: Self-pay | Admitting: Cardiology

## 2024-11-02 NOTE — Telephone Encounter (Signed)
" °*  STAT* If patient is at the pharmacy, call can be transferred to refill team.   1. Which medications need to be refilled? (please list name of each medication and dose if known)   isosorbide -hydrALAZINE  (BIDIL ) 20-37.5 MG tablet     2. Would you like to learn more about the convenience, safety, & potential cost savings by using the [facility]?   3. Are you open to using the [facility] (Type [facility]. ).   4. Which pharmacy/location (including street and city if local pharmacy) is medication to be sent to? [id number] - [place], [place] - [address] AT [facility] [address]    5. Do they need a 30 day or 90 day supply? 90 day  "

## 2024-11-03 MED ORDER — ISOSORB DINITRATE-HYDRALAZINE 20-37.5 MG PO TABS: ORAL_TABLET | ORAL | 2 refills | Status: AC

## 2024-11-03 NOTE — Telephone Encounter (Signed)
 Requested Prescriptions   Signed Prescriptions Disp Refills   isosorbide -hydrALAZINE  (BIDIL ) 20-37.5 MG tablet 180 tablet 2    Sig: Take 2 tablets three times daily. Please stop if blood pressure drops too low    Authorizing Provider: ELMIRA NEWMAN PARAS    Ordering User: Stran Raper  C

## 2024-11-05 ENCOUNTER — Ambulatory Visit: Admitting: Pulmonary Disease

## 2024-11-17 NOTE — Telephone Encounter (Signed)
 Patient never called with correct Ins info

## 2024-12-14 ENCOUNTER — Ambulatory Visit (HOSPITAL_COMMUNITY)

## 2025-01-04 ENCOUNTER — Ambulatory Visit

## 2025-01-12 ENCOUNTER — Ambulatory Visit: Admitting: Allergy

## 2025-04-05 ENCOUNTER — Ambulatory Visit

## 2025-07-05 ENCOUNTER — Ambulatory Visit

## 2025-10-04 ENCOUNTER — Ambulatory Visit

## 2026-01-03 ENCOUNTER — Ambulatory Visit

## 2026-04-04 ENCOUNTER — Ambulatory Visit

## 2026-07-04 ENCOUNTER — Ambulatory Visit

## 2026-10-03 ENCOUNTER — Ambulatory Visit

## 2027-01-02 ENCOUNTER — Ambulatory Visit
